# Patient Record
Sex: Female | Born: 1950
Health system: Southern US, Community
[De-identification: ages and names within clinical notes are randomized; demographics above are authoritative.]

## PROBLEM LIST (undated history)

## (undated) DIAGNOSIS — F32A Depression, unspecified: Secondary | ICD-10-CM

## (undated) DIAGNOSIS — R002 Palpitations: Secondary | ICD-10-CM

## (undated) DIAGNOSIS — I7300 Raynaud's syndrome without gangrene: Secondary | ICD-10-CM

## (undated) DIAGNOSIS — E119 Type 2 diabetes mellitus without complications: Secondary | ICD-10-CM

## (undated) DIAGNOSIS — I341 Nonrheumatic mitral (valve) prolapse: Secondary | ICD-10-CM

## (undated) DIAGNOSIS — I10 Essential (primary) hypertension: Secondary | ICD-10-CM

## (undated) DIAGNOSIS — F419 Anxiety disorder, unspecified: Secondary | ICD-10-CM

## (undated) DIAGNOSIS — M7989 Other specified soft tissue disorders: Secondary | ICD-10-CM

## (undated) DIAGNOSIS — G4700 Insomnia, unspecified: Secondary | ICD-10-CM

## (undated) DIAGNOSIS — R5383 Other fatigue: Secondary | ICD-10-CM

## (undated) DIAGNOSIS — R0789 Other chest pain: Secondary | ICD-10-CM

## (undated) DIAGNOSIS — Z87898 Personal history of other specified conditions: Secondary | ICD-10-CM

## (undated) DIAGNOSIS — F329 Major depressive disorder, single episode, unspecified: Secondary | ICD-10-CM

## (undated) DIAGNOSIS — E669 Obesity, unspecified: Secondary | ICD-10-CM

## (undated) DIAGNOSIS — R42 Dizziness and giddiness: Secondary | ICD-10-CM

## (undated) DIAGNOSIS — R202 Paresthesia of skin: Secondary | ICD-10-CM

## (undated) DIAGNOSIS — E785 Hyperlipidemia, unspecified: Secondary | ICD-10-CM

## (undated) HISTORY — DX: Personal history of other specified conditions: Z87.898

## (undated) HISTORY — DX: Obesity, unspecified: E66.9

## (undated) HISTORY — DX: Dizziness and giddiness: R42

## (undated) HISTORY — DX: Essential (primary) hypertension: I10

## (undated) HISTORY — PX: BREAST SURGERY: SHX581

## (undated) HISTORY — DX: Palpitations: R00.2

## (undated) HISTORY — PX: ABDOMINAL HYSTERECTOMY: SHX81

## (undated) HISTORY — DX: Paresthesia of skin: R20.2

## (undated) HISTORY — DX: Type 2 diabetes mellitus without complications: E11.9

## (undated) HISTORY — DX: Depression, unspecified: F32.A

## (undated) HISTORY — PX: OTHER SURGICAL HISTORY: SHX169

## (undated) HISTORY — DX: Other chest pain: R07.89

## (undated) HISTORY — PX: CHOLECYSTECTOMY: SHX55

## (undated) HISTORY — DX: Nonrheumatic mitral (valve) prolapse: I34.1

## (undated) HISTORY — DX: Anxiety disorder, unspecified: F41.9

## (undated) HISTORY — DX: Insomnia, unspecified: G47.00

## (undated) HISTORY — PX: BREAST EXCISIONAL BIOPSY: SUR124

## (undated) HISTORY — DX: Raynaud's syndrome without gangrene: I73.00

## (undated) HISTORY — DX: Major depressive disorder, single episode, unspecified: F32.9

## (undated) HISTORY — DX: Hyperlipidemia, unspecified: E78.5

## (undated) HISTORY — DX: Other fatigue: R53.83

---

## 1987-09-05 HISTORY — PX: VESICOVAGINAL FISTULA CLOSURE W/ TAH: SUR271

## 2001-03-04 ENCOUNTER — Ambulatory Visit (HOSPITAL_COMMUNITY): Admission: RE | Admit: 2001-03-04 | Discharge: 2001-03-04 | Payer: Self-pay | Admitting: Family Medicine

## 2001-03-04 ENCOUNTER — Encounter: Payer: Self-pay | Admitting: Family Medicine

## 2001-05-22 ENCOUNTER — Other Ambulatory Visit: Admission: RE | Admit: 2001-05-22 | Discharge: 2001-05-22 | Payer: Self-pay | Admitting: Family Medicine

## 2002-03-06 ENCOUNTER — Ambulatory Visit (HOSPITAL_COMMUNITY): Admission: RE | Admit: 2002-03-06 | Discharge: 2002-03-06 | Payer: Self-pay | Admitting: Family Medicine

## 2002-03-06 ENCOUNTER — Encounter: Payer: Self-pay | Admitting: Family Medicine

## 2003-03-19 ENCOUNTER — Encounter: Payer: Self-pay | Admitting: *Deleted

## 2003-03-19 ENCOUNTER — Encounter (HOSPITAL_COMMUNITY): Admission: RE | Admit: 2003-03-19 | Discharge: 2003-04-18 | Payer: Self-pay | Admitting: *Deleted

## 2003-06-09 ENCOUNTER — Encounter: Payer: Self-pay | Admitting: Family Medicine

## 2003-06-09 ENCOUNTER — Ambulatory Visit (HOSPITAL_COMMUNITY): Admission: RE | Admit: 2003-06-09 | Discharge: 2003-06-09 | Payer: Self-pay | Admitting: Family Medicine

## 2003-06-11 ENCOUNTER — Ambulatory Visit (HOSPITAL_COMMUNITY): Admission: RE | Admit: 2003-06-11 | Discharge: 2003-06-11 | Payer: Self-pay | Admitting: General Surgery

## 2003-06-11 HISTORY — PX: COLONOSCOPY: SHX174

## 2004-08-08 ENCOUNTER — Ambulatory Visit: Payer: Self-pay | Admitting: Family Medicine

## 2004-10-31 ENCOUNTER — Ambulatory Visit: Payer: Self-pay | Admitting: Family Medicine

## 2005-01-04 ENCOUNTER — Ambulatory Visit: Payer: Self-pay | Admitting: Family Medicine

## 2005-01-11 ENCOUNTER — Ambulatory Visit (HOSPITAL_COMMUNITY): Admission: RE | Admit: 2005-01-11 | Discharge: 2005-01-11 | Payer: Self-pay | Admitting: Family Medicine

## 2005-03-22 ENCOUNTER — Ambulatory Visit: Payer: Self-pay | Admitting: Family Medicine

## 2005-03-31 ENCOUNTER — Emergency Department (HOSPITAL_COMMUNITY): Admission: EM | Admit: 2005-03-31 | Discharge: 2005-03-31 | Payer: Self-pay | Admitting: Emergency Medicine

## 2005-04-06 ENCOUNTER — Ambulatory Visit: Payer: Self-pay | Admitting: Family Medicine

## 2005-05-18 ENCOUNTER — Ambulatory Visit: Payer: Self-pay | Admitting: Family Medicine

## 2005-08-01 ENCOUNTER — Ambulatory Visit: Payer: Self-pay | Admitting: Family Medicine

## 2005-09-20 ENCOUNTER — Ambulatory Visit: Payer: Self-pay | Admitting: Family Medicine

## 2005-11-13 ENCOUNTER — Ambulatory Visit: Payer: Self-pay | Admitting: Family Medicine

## 2006-01-26 ENCOUNTER — Ambulatory Visit: Payer: Self-pay | Admitting: Family Medicine

## 2006-02-05 ENCOUNTER — Ambulatory Visit (HOSPITAL_COMMUNITY): Admission: RE | Admit: 2006-02-05 | Discharge: 2006-02-05 | Payer: Self-pay | Admitting: Family Medicine

## 2006-02-14 ENCOUNTER — Ambulatory Visit: Payer: Self-pay | Admitting: Family Medicine

## 2006-02-22 ENCOUNTER — Ambulatory Visit (HOSPITAL_COMMUNITY): Admission: RE | Admit: 2006-02-22 | Discharge: 2006-02-22 | Payer: Self-pay | Admitting: Family Medicine

## 2006-04-26 ENCOUNTER — Ambulatory Visit: Payer: Self-pay | Admitting: Family Medicine

## 2006-08-07 ENCOUNTER — Ambulatory Visit: Payer: Self-pay | Admitting: Family Medicine

## 2006-10-03 ENCOUNTER — Ambulatory Visit: Payer: Self-pay | Admitting: Family Medicine

## 2006-10-11 ENCOUNTER — Ambulatory Visit: Payer: Self-pay | Admitting: Orthopedic Surgery

## 2006-11-06 ENCOUNTER — Encounter: Payer: Self-pay | Admitting: Family Medicine

## 2006-11-06 LAB — CONVERTED CEMR LAB
Alkaline Phosphatase: 74 units/L (ref 39–117)
Basophils Relative: 1 % (ref 0–1)
Bilirubin, Direct: 0.1 mg/dL (ref 0.0–0.3)
CO2: 28 meq/L (ref 19–32)
Calcium: 9 mg/dL (ref 8.4–10.5)
Cholesterol: 152 mg/dL (ref 0–200)
Eosinophils Absolute: 0.2 10*3/uL (ref 0.0–0.7)
HDL: 71 mg/dL (ref >39)
Hemoglobin: 12.7 g/dL (ref 12.0–15.0)
LDL Cholesterol: 66 mg/dL (ref 0–99)
Lymphocytes Relative: 34 % (ref 12–46)
Lymphs Abs: 1.6 10*3/uL (ref 0.7–3.3)
MCV: 91.6 fL (ref 78.0–100.0)
Monocytes Absolute: 0.6 10*3/uL (ref 0.2–0.7)
Monocytes Relative: 12 % — ABNORMAL HIGH (ref 3–11)
Neutrophils Relative %: 49 % (ref 43–77)
Platelets: 257 10*3/uL (ref 150–400)
Potassium: 3.8 meq/L (ref 3.5–5.3)
RBC: 4.18 M/uL (ref 3.87–5.11)
RDW: 13.3 % (ref 11.5–14.0)
Total Bilirubin: 0.4 mg/dL (ref 0.3–1.2)
Total CHOL/HDL Ratio: 2.1
Triglycerides: 74 mg/dL (ref <150)
VLDL: 15 mg/dL (ref 0–40)

## 2006-11-08 ENCOUNTER — Ambulatory Visit: Payer: Self-pay | Admitting: Family Medicine

## 2007-02-07 ENCOUNTER — Ambulatory Visit: Payer: Self-pay | Admitting: Family Medicine

## 2007-03-14 ENCOUNTER — Ambulatory Visit: Payer: Self-pay | Admitting: Family Medicine

## 2007-03-21 ENCOUNTER — Ambulatory Visit (HOSPITAL_COMMUNITY): Admission: RE | Admit: 2007-03-21 | Discharge: 2007-03-21 | Payer: Self-pay | Admitting: Family Medicine

## 2007-03-26 ENCOUNTER — Ambulatory Visit: Payer: Self-pay | Admitting: Family Medicine

## 2007-03-26 ENCOUNTER — Encounter: Payer: Self-pay | Admitting: Family Medicine

## 2007-03-26 ENCOUNTER — Other Ambulatory Visit: Admission: RE | Admit: 2007-03-26 | Discharge: 2007-03-26 | Payer: Self-pay | Admitting: Family Medicine

## 2007-03-26 LAB — CONVERTED CEMR LAB: Pap Smear: NORMAL

## 2007-08-27 ENCOUNTER — Ambulatory Visit: Payer: Self-pay | Admitting: Family Medicine

## 2007-09-05 ENCOUNTER — Encounter: Payer: Self-pay | Admitting: Family Medicine

## 2007-09-17 ENCOUNTER — Encounter: Payer: Self-pay | Admitting: Family Medicine

## 2007-09-17 LAB — CONVERTED CEMR LAB
Albumin: 4.2 g/dL (ref 3.5–5.2)
Alkaline Phosphatase: 91 units/L (ref 39–117)
CO2: 23 meq/L (ref 19–32)
Chloride: 104 meq/L (ref 96–112)
Cholesterol: 170 mg/dL (ref 0–200)
Creatinine, Ser: 0.86 mg/dL (ref 0.40–1.20)
Indirect Bilirubin: 0.3 mg/dL (ref 0.0–0.9)
LDL Cholesterol: 79 mg/dL (ref 0–99)
Total CHOL/HDL Ratio: 2.2
Total Protein: 7.6 g/dL (ref 6.0–8.3)
Triglycerides: 64 mg/dL (ref <150)
VLDL: 13 mg/dL (ref 0–40)

## 2007-10-29 ENCOUNTER — Ambulatory Visit: Payer: Self-pay | Admitting: Family Medicine

## 2007-11-28 ENCOUNTER — Ambulatory Visit: Payer: Self-pay | Admitting: Family Medicine

## 2008-02-04 ENCOUNTER — Encounter: Payer: Self-pay | Admitting: Family Medicine

## 2008-02-04 DIAGNOSIS — F418 Other specified anxiety disorders: Secondary | ICD-10-CM

## 2008-02-04 DIAGNOSIS — E785 Hyperlipidemia, unspecified: Secondary | ICD-10-CM

## 2008-02-04 DIAGNOSIS — G4700 Insomnia, unspecified: Secondary | ICD-10-CM

## 2008-02-04 DIAGNOSIS — I10 Essential (primary) hypertension: Secondary | ICD-10-CM

## 2008-02-04 DIAGNOSIS — F411 Generalized anxiety disorder: Secondary | ICD-10-CM | POA: Insufficient documentation

## 2008-03-20 ENCOUNTER — Encounter: Payer: Self-pay | Admitting: Family Medicine

## 2008-03-20 LAB — CONVERTED CEMR LAB
Albumin: 4.2 g/dL (ref 3.5–5.2)
Alkaline Phosphatase: 86 units/L (ref 39–117)
BUN: 16 mg/dL (ref 6–23)
CO2: 24 meq/L (ref 19–32)
Chloride: 106 meq/L (ref 96–112)
Cholesterol: 143 mg/dL (ref 0–200)
Eosinophils Absolute: 0.2 10*3/uL (ref 0.0–0.7)
Eosinophils Relative: 4 % (ref 0–5)
HCT: 39.6 % (ref 36.0–46.0)
Indirect Bilirubin: 0.2 mg/dL (ref 0.0–0.9)
Lymphocytes Relative: 45 % (ref 12–46)
MCV: 92.5 fL (ref 78.0–100.0)
Neutrophils Relative %: 39 % — ABNORMAL LOW (ref 43–77)
Platelets: 244 10*3/uL (ref 150–400)
Sodium: 142 meq/L (ref 135–145)
Total Bilirubin: 0.3 mg/dL (ref 0.3–1.2)
Triglycerides: 91 mg/dL (ref <150)
VLDL: 18 mg/dL (ref 0–40)
WBC: 4.9 10*3/uL (ref 4.0–10.5)

## 2008-03-26 ENCOUNTER — Ambulatory Visit: Payer: Self-pay | Admitting: Family Medicine

## 2008-03-26 ENCOUNTER — Other Ambulatory Visit: Admission: RE | Admit: 2008-03-26 | Discharge: 2008-03-26 | Payer: Self-pay | Admitting: Family Medicine

## 2008-03-26 ENCOUNTER — Encounter: Payer: Self-pay | Admitting: Family Medicine

## 2008-03-27 ENCOUNTER — Ambulatory Visit (HOSPITAL_COMMUNITY): Admission: RE | Admit: 2008-03-27 | Discharge: 2008-03-27 | Payer: Self-pay | Admitting: Family Medicine

## 2008-04-03 ENCOUNTER — Encounter: Payer: Self-pay | Admitting: Family Medicine

## 2008-04-06 ENCOUNTER — Ambulatory Visit (HOSPITAL_COMMUNITY): Admission: RE | Admit: 2008-04-06 | Discharge: 2008-04-06 | Payer: Self-pay | Admitting: Family Medicine

## 2008-04-13 ENCOUNTER — Encounter: Payer: Self-pay | Admitting: Family Medicine

## 2008-04-27 ENCOUNTER — Encounter: Payer: Self-pay | Admitting: Family Medicine

## 2008-04-28 ENCOUNTER — Encounter: Payer: Self-pay | Admitting: Family Medicine

## 2008-05-05 ENCOUNTER — Ambulatory Visit: Payer: Self-pay | Admitting: Family Medicine

## 2008-05-05 DIAGNOSIS — R040 Epistaxis: Secondary | ICD-10-CM

## 2008-06-22 ENCOUNTER — Encounter: Payer: Self-pay | Admitting: Family Medicine

## 2008-08-03 ENCOUNTER — Ambulatory Visit: Payer: Self-pay | Admitting: Family Medicine

## 2008-08-25 ENCOUNTER — Encounter: Payer: Self-pay | Admitting: Family Medicine

## 2008-09-17 ENCOUNTER — Ambulatory Visit: Payer: Self-pay | Admitting: Family Medicine

## 2008-10-06 ENCOUNTER — Telehealth: Payer: Self-pay | Admitting: Family Medicine

## 2008-10-15 ENCOUNTER — Ambulatory Visit (HOSPITAL_COMMUNITY): Admission: RE | Admit: 2008-10-15 | Discharge: 2008-10-15 | Payer: Self-pay | Admitting: Family Medicine

## 2008-10-26 ENCOUNTER — Telehealth: Payer: Self-pay | Admitting: Family Medicine

## 2008-11-17 ENCOUNTER — Encounter: Payer: Self-pay | Admitting: Family Medicine

## 2008-11-27 ENCOUNTER — Encounter: Payer: Self-pay | Admitting: Family Medicine

## 2008-11-30 ENCOUNTER — Encounter: Payer: Self-pay | Admitting: Family Medicine

## 2008-12-29 ENCOUNTER — Encounter: Payer: Self-pay | Admitting: Family Medicine

## 2008-12-30 ENCOUNTER — Ambulatory Visit: Payer: Self-pay | Admitting: Family Medicine

## 2009-01-02 ENCOUNTER — Encounter: Payer: Self-pay | Admitting: Family Medicine

## 2009-01-05 ENCOUNTER — Encounter: Payer: Self-pay | Admitting: Family Medicine

## 2009-01-05 LAB — CONVERTED CEMR LAB
ALT: 19 units/L (ref 0–35)
Alkaline Phosphatase: 87 units/L (ref 39–117)
CO2: 25 meq/L (ref 19–32)
Calcium: 9.1 mg/dL (ref 8.4–10.5)
Chloride: 103 meq/L (ref 96–112)
HDL: 68 mg/dL (ref >39)
LDL Cholesterol: 66 mg/dL (ref 0–99)
Sodium: 139 meq/L (ref 135–145)
TSH: 1.031 microintl units/mL (ref 0.350–4.500)
Total Bilirubin: 0.3 mg/dL (ref 0.3–1.2)
Total CHOL/HDL Ratio: 2.1
Triglycerides: 57 mg/dL (ref <150)
VLDL: 11 mg/dL (ref 0–40)

## 2009-02-25 ENCOUNTER — Ambulatory Visit: Payer: Self-pay | Admitting: Family Medicine

## 2009-02-26 DIAGNOSIS — R5383 Other fatigue: Secondary | ICD-10-CM

## 2009-02-26 DIAGNOSIS — R079 Chest pain, unspecified: Secondary | ICD-10-CM

## 2009-02-26 DIAGNOSIS — R5381 Other malaise: Secondary | ICD-10-CM

## 2009-03-17 ENCOUNTER — Ambulatory Visit: Payer: Self-pay | Admitting: Cardiology

## 2009-04-02 ENCOUNTER — Ambulatory Visit (HOSPITAL_COMMUNITY): Admission: RE | Admit: 2009-04-02 | Discharge: 2009-04-02 | Payer: Self-pay | Admitting: Family Medicine

## 2009-04-12 ENCOUNTER — Ambulatory Visit: Payer: Self-pay | Admitting: Cardiology

## 2009-04-12 ENCOUNTER — Encounter: Payer: Self-pay | Admitting: Cardiology

## 2009-04-12 ENCOUNTER — Ambulatory Visit (HOSPITAL_COMMUNITY): Admission: RE | Admit: 2009-04-12 | Discharge: 2009-04-12 | Payer: Self-pay | Admitting: Cardiology

## 2009-04-23 ENCOUNTER — Ambulatory Visit: Payer: Self-pay | Admitting: Cardiology

## 2009-05-03 ENCOUNTER — Encounter: Payer: Self-pay | Admitting: Family Medicine

## 2009-05-03 ENCOUNTER — Ambulatory Visit: Payer: Self-pay | Admitting: Family Medicine

## 2009-05-03 ENCOUNTER — Other Ambulatory Visit: Admission: RE | Admit: 2009-05-03 | Discharge: 2009-05-03 | Payer: Self-pay | Admitting: Family Medicine

## 2009-06-08 ENCOUNTER — Ambulatory Visit: Payer: Self-pay | Admitting: Family Medicine

## 2009-06-08 DIAGNOSIS — B353 Tinea pedis: Secondary | ICD-10-CM

## 2009-06-08 DIAGNOSIS — R002 Palpitations: Secondary | ICD-10-CM | POA: Insufficient documentation

## 2009-06-21 ENCOUNTER — Encounter: Payer: Self-pay | Admitting: Family Medicine

## 2009-06-24 ENCOUNTER — Ambulatory Visit: Payer: Self-pay | Admitting: Cardiology

## 2009-06-24 ENCOUNTER — Telehealth: Payer: Self-pay | Admitting: Family Medicine

## 2009-06-25 ENCOUNTER — Telehealth: Payer: Self-pay | Admitting: Family Medicine

## 2009-06-30 ENCOUNTER — Encounter: Payer: Self-pay | Admitting: Family Medicine

## 2009-07-07 ENCOUNTER — Ambulatory Visit: Payer: Self-pay | Admitting: Family Medicine

## 2009-09-03 ENCOUNTER — Encounter: Payer: Self-pay | Admitting: Family Medicine

## 2009-09-20 ENCOUNTER — Ambulatory Visit: Payer: Self-pay | Admitting: Family Medicine

## 2009-09-24 ENCOUNTER — Encounter: Payer: Self-pay | Admitting: Family Medicine

## 2009-09-25 ENCOUNTER — Encounter: Payer: Self-pay | Admitting: Family Medicine

## 2009-09-25 LAB — CONVERTED CEMR LAB
ALT: 20 units/L (ref 0–35)
Alkaline Phosphatase: 80 units/L (ref 39–117)
Basophils Absolute: 0 10*3/uL (ref 0.0–0.1)
Basophils Relative: 1 % (ref 0–1)
Bilirubin, Direct: 0.1 mg/dL (ref 0.0–0.3)
Creatinine, Ser: 0.84 mg/dL (ref 0.40–1.20)
Eosinophils Absolute: 0.2 10*3/uL (ref 0.0–0.7)
Indirect Bilirubin: 0.3 mg/dL (ref 0.0–0.9)
Lymphs Abs: 1.9 10*3/uL (ref 0.7–4.0)
Monocytes Absolute: 0.5 10*3/uL (ref 0.1–1.0)
Monocytes Relative: 12 % (ref 3–12)
Neutro Abs: 1.6 10*3/uL — ABNORMAL LOW (ref 1.7–7.7)
Neutrophils Relative %: 37 % — ABNORMAL LOW (ref 43–77)
RBC: 4.25 M/uL (ref 3.87–5.11)
Sodium: 141 meq/L (ref 135–145)
Total CHOL/HDL Ratio: 2.3
Total Protein: 7.5 g/dL (ref 6.0–8.3)
Triglycerides: 66 mg/dL (ref <150)
VLDL: 13 mg/dL (ref 0–40)

## 2009-10-25 ENCOUNTER — Ambulatory Visit: Payer: Self-pay | Admitting: Family Medicine

## 2009-10-29 ENCOUNTER — Telehealth: Payer: Self-pay | Admitting: Family Medicine

## 2009-11-03 ENCOUNTER — Telehealth: Payer: Self-pay | Admitting: Family Medicine

## 2009-11-04 ENCOUNTER — Encounter: Payer: Self-pay | Admitting: Family Medicine

## 2010-04-11 ENCOUNTER — Ambulatory Visit: Payer: Self-pay | Admitting: Family Medicine

## 2010-04-11 DIAGNOSIS — M129 Arthropathy, unspecified: Secondary | ICD-10-CM | POA: Insufficient documentation

## 2010-04-18 ENCOUNTER — Ambulatory Visit (HOSPITAL_COMMUNITY): Admission: RE | Admit: 2010-04-18 | Discharge: 2010-04-18 | Payer: Self-pay | Admitting: Family Medicine

## 2010-05-13 ENCOUNTER — Ambulatory Visit: Payer: Self-pay | Admitting: Family Medicine

## 2010-05-13 LAB — CONVERTED CEMR LAB
ALT: 24 units/L (ref 0–35)
AST: 19 units/L (ref 0–37)
Alkaline Phosphatase: 94 units/L (ref 39–117)
BUN: 14 mg/dL (ref 6–23)
CO2: 28 meq/L (ref 19–32)
Sodium: 143 meq/L (ref 135–145)
TSH: 1.213 microintl units/mL (ref 0.350–4.500)
Total CHOL/HDL Ratio: 2.1

## 2010-07-12 ENCOUNTER — Telehealth: Payer: Self-pay | Admitting: Family Medicine

## 2010-07-19 ENCOUNTER — Ambulatory Visit: Payer: Self-pay | Admitting: Family Medicine

## 2010-07-19 ENCOUNTER — Other Ambulatory Visit: Admission: RE | Admit: 2010-07-19 | Discharge: 2010-07-19 | Payer: Self-pay | Admitting: Family Medicine

## 2010-07-22 ENCOUNTER — Encounter: Payer: Self-pay | Admitting: Family Medicine

## 2010-07-26 ENCOUNTER — Telehealth: Payer: Self-pay | Admitting: Family Medicine

## 2010-07-27 LAB — CONVERTED CEMR LAB: Pap Smear: NEGATIVE

## 2010-08-26 ENCOUNTER — Telehealth: Payer: Self-pay | Admitting: Family Medicine

## 2010-09-26 ENCOUNTER — Encounter: Payer: Self-pay | Admitting: Family Medicine

## 2010-10-04 NOTE — Progress Notes (Signed)
Summary: dermatology  dermatology   Imported By: Lind Guest 09/20/2009 08:47:58  _____________________________________________________________________  External Attachment:    Type:   Image     Comment:   External Document

## 2010-10-04 NOTE — Miscellaneous (Signed)
Summary: Orders Update  Clinical Lists Changes  Orders: Added new Test order of T-Basic Metabolic Panel ([id number]) - Signed Added new Test order of T-Hepatic Function ([id number]) - Signed Added new Test order of T-Lipid Profile ([id number]) - Signed Added new Test order of T-TSH ([id number]) - Signed 

## 2010-10-04 NOTE — Progress Notes (Signed)
Summary: insurance  Phone Note Call from Patient   Summary of Call: pt needs to speak with nurse about ins. on a rx. [phone] Initial call taken by: Rudene Anda,  November 03, 2009 2:42 PM  Follow-up for Phone Call        Ambien CR not covered. Pt wanted zolpidem sent in so she could get it. Zolpidem also not covered. Filled out PA and sent in. Should hear something tomorrow.  Called patient no answer. Left msg Follow-up by: Everitt Amber LPN,  November 03, 2009 2:57 PM  Additional Follow-up for Phone Call Additional follow up Details #1::        called patient to let her know Zolpidem wasn't covered and that the PA was in progress. Family member to get her to call me back Additional Follow-up by: Everitt Amber LPN,  November 04, 2009 10:02 AM    Additional Follow-up for Phone Call Additional follow up Details #2::    Ambien approved. Sent to [employer]. Will let patient know when she calls back  Follow-up by: Everitt Amber LPN,  November 04, 2009 2:47 PM

## 2010-10-04 NOTE — Assessment & Plan Note (Signed)
Summary: ov   Vital Signs:  Patient profile:   60 year old female Menstrual status:  hysterectomy Height:      64 inches Weight:      200 pounds BMI:     34.45 O2 Sat:      98 % Pulse rate:   91 / minute Pulse rhythm:   regular Resp:     16 per minute BP sitting:   118 / 74 Cuff size:   large  Vitals Entered By: Everitt Amber (September 20, 2009 4:08 PM)  Nutrition Counseling: Patient's BMI is greater than 25 and therefore counseled on weight management options. CC: Follow up chronic problems   Primary Care Provider:  Syliva Overman MD  CC:  Follow up chronic problems.  History of Present Illness: Reports  that she has been doing well. Denies recent fever or chills. Denies sinus pressure, nasal congestion , ear pain or sore throat.She doe report unilateral epistaxis for several months however. Denies chest congestion, or cough productive of sputum. Denies chest pain, palpitations, PND, orthopnea or leg swelling. Denies abdominal pain, nausea, vomitting, diarrhea or constipation. Denies change in bowel movements or bloody stool. Denies dysuria , frequency, incontinence or hesitancy. Denies  joint pain, swelling, or reduced mobility. Denies headaches, vertigo, seizures. Denies depression, anxiety or insomnia. Denies  rash, lesions, or itch. The pt reports regular exercise, and is somewhat disappointedby her lack of weight loss, which she intends to work hard at.     Current Medications (verified): 1)  Diovan Hct 80-12.5 Mg Tabs (Valsartan-Hydrochlorothiazide) .... Take 1 Tablet By Mouth Once A Day 2)  Aspirin 81 Mg  Tbec (Aspirin) .... Take 1 Tablet By Mouth Once A Day 3)  Ibuprofen 800 Mg Tabs (Ibuprofen) .... Take 1 Tablet By Mouth Two Times A Day As Needed 4)  Simvastatin 40 Mg Tabs (Simvastatin) .... One Tab By Mouth Qhs 5)  Ambien Cr 12.5 Mg Cr-Tabs (Zolpidem Tartrate) .... Take 1 Tab By Mouth At Bedtime As Needed 6)  Norvasc 5 Mg Tabs (Amlodipine Besylate) ....  Take 1 Tablet Once Daily 7)  Venlafaxine Hcl 37.5 Mg Tabs (Venlafaxine Hcl) .... Take 1 Tablet Two Times A Day 8)  Fish Oil 1000 Mg Caps (Omega-3 Fatty Acids) .... Take 1 Tablet By Mouth Once A Day 9)  Vitamin E 200 Unit Caps (Vitamin E) .... Take 1 Tablet By Mouth Once A Day 10)  Atenolol 25 Mg Tabs (Atenolol) .... Take 1 Tablet By Mouth Two Times A Day 11)  Clotrimazole-Betamethasone 1-0.05 % Lotn (Clotrimazole-Betamethasone) .... Apply To Affected Lesions On The Dorsum of Both Feet 12)  Betamethasone Valerate 0.1 % Crea (Betamethasone Valerate) .... Apply  To Palms  Twice Daily For  10 Days, Then As Needed 13)  Hydrochlorothiazide 12.5 Mg Tabs (Hydrochlorothiazide) .... Take 1 Tablet By Mouth Once A Day 14)  Diovan Hct 80-12.5 Mg Tabs (Valsartan-Hydrochlorothiazide) .... Take 1 Tablet By Mouth Once A Day  Allergies (verified): No Known Drug Allergies  Review of Systems      See HPI ENT:  Complains of nosebleeds and postnasal drainage; 6 month history of unilateral right sided epistaxis, spontaneous, on a bad week once or twice, had excessive symptoms last week with bloody post nasal drainage x 1 episode. MS:  Complains of joint pain and stiffness; rheumatoid arthritis currently improved, sees rheumatology regularly. Derm:  Denies insect bite(s), itching, and rash. Neuro:  Denies headaches and visual disturbances. Endo:  Denies cold intolerance, excessive hunger, excessive thirst, excessive urination,  heat intolerance, polyuria, and weight change.  Physical Exam  General:  Well-developed,well-nourished,in no acute distress; alert,appropriate and cooperative throughout examination HEENT: No facial asymmetry,  EOMI, no  sinus tenderness, TM's Clear, oropharynx  pink and moist.   Chest: clear to ascultation CVS: S1, S2, No murmurs, No S3.   Abd: Soft, Nontender.  MS: Adequate ROM spine, hips, shoulders and knees.  Ext: No edema.   CNS: CN 2-12 intact, power tone and sensation normal  throughout.   Skin: Intact, Psych: Good eye contact, normal affect.  Memory intact, not anxious or depressed appearing.    Impression & Recommendations:  Problem # 1:  EPISTAXIS, RECURRENT (ICD-784.7) Assessment Unchanged  Future Orders: ENT Referral (ENT) ... 09/21/2009  Problem # 2:  SKIN RASH (ICD-782.1) Assessment: Improved  Her updated medication list for this problem includes:    Clotrimazole-betamethasone 1-0.05 % Lotn (Clotrimazole-betamethasone) .Marland Kitchen... Apply to affected lesions on the dorsum of both feet    Betamethasone Valerate 0.1 % Crea (Betamethasone valerate) .Marland Kitchen... Apply  to palms  twice daily for  10 days, then as needed  Problem # 3:  OBESITY (ICD-278.00) Assessment: Unchanged  Ht: 64 (09/20/2009)   Wt: 200 (09/20/2009)   BMI: 34.45 (09/20/2009)  Problem # 4:  HYPERTENSION (ICD-401.9) Assessment: Unchanged  Her updated medication list for this problem includes:    Diovan Hct 80-12.5 Mg Tabs (Valsartan-hydrochlorothiazide) .Marland Kitchen... Take 1 tablet by mouth once a day    Norvasc 5 Mg Tabs (Amlodipine besylate) .Marland Kitchen... Take 1 tablet once daily    Atenolol 25 Mg Tabs (Atenolol) .Marland Kitchen... Take 1 tablet by mouth two times a day    Hydrochlorothiazide 12.5 Mg Tabs (Hydrochlorothiazide) .Marland Kitchen... Take 1 tablet by mouth once a day  Orders: T-Basic Metabolic Panel ([id number])  BP today: 118/74 Prior BP: 122/74 (07/07/2009)  Labs Reviewed: K+: 3.7 (01/02/2009) Creat: : 0.92 (01/02/2009)   Chol: 145 (01/02/2009)   HDL: 68 (01/02/2009)   LDL: 66 (01/02/2009)   TG: 57 (01/02/2009)  Problem # 5:  HYPERLIPIDEMIA (ICD-272.4) Assessment: Comment Only  Her updated medication list for this problem includes:    Simvastatin 40 Mg Tabs (Simvastatin) ..... One tab by mouth qhs  Orders: T-Lipid Profile [id number]) T-Hepatic Function [id number])  Labs Reviewed: SGOT: 18 (01/02/2009)   SGPT: 19 (01/02/2009)   HDL:68 (01/02/2009), 71 (03/20/2008)  LDL:66 (01/02/2009), 54  (28/41/3244)  Chol:145 (01/02/2009), 143 (03/20/2008)  Trig:57 (01/02/2009), 91 (03/20/2008)  Complete Medication List: 1)  Diovan Hct 80-12.5 Mg Tabs (Valsartan-hydrochlorothiazide) .... Take 1 tablet by mouth once a day 2)  Aspirin 81 Mg Tbec (Aspirin) .... Take 1 tablet by mouth once a day 3)  Ibuprofen 800 Mg Tabs (Ibuprofen) .... Take 1 tablet by mouth two times a day as needed 4)  Simvastatin 40 Mg Tabs (Simvastatin) .... One tab by mouth qhs 5)  Ambien Cr 12.5 Mg Cr-tabs (Zolpidem tartrate) .... Take 1 tab by mouth at bedtime as needed 6)  Norvasc 5 Mg Tabs (Amlodipine besylate) .... Take 1 tablet once daily 7)  Venlafaxine Hcl 37.5 Mg Tabs (Venlafaxine hcl) .... Take 1 tablet two times a day 8)  Fish Oil 1000 Mg Caps (Omega-3 fatty acids) .... Take 1 tablet by mouth once a day 9)  Vitamin E 200 Unit Caps (Vitamin e) .... Take 1 tablet by mouth once a day 10)  Atenolol 25 Mg Tabs (Atenolol) .... Take 1 tablet by mouth two times a day 11)  Clotrimazole-betamethasone 1-0.05 % Lotn (Clotrimazole-betamethasone) .... Apply to affected lesions on  the dorsum of both feet 12)  Betamethasone Valerate 0.1 % Crea (Betamethasone valerate) .... Apply  to palms  twice daily for  10 days, then as needed 13)  Hydrochlorothiazide 12.5 Mg Tabs (Hydrochlorothiazide) .... Take 1 tablet by mouth once a day 14)  Diovan Hct 80-12.5 Mg Tabs (Valsartan-hydrochlorothiazide) .... Take 1 tablet by mouth once a day  Other Orders: T-CBC w/Diff ([id number])  Patient Instructions: 1)  Please schedule a follow-up appointment in 4 months. 2)  It is important that you exercise regularly at least 20 minutes 5 times a week. If you develop chest pain, have severe difficulty breathing, or feel very tired , stop exercising immediately and seek medical attention. 3)  You need to lose weight. Consider a lower calorie diet and regular exercise.  4)  BMP prior to visit, ICD-9: 5)  Hepatic Panel prior to visit, ICD-9:  fasting  asap 6)  Lipid Panel prior to visit, ICD-9: 7)  CBC w/ Diff prior to visit, ICD-9: 8)  You will be referred to ENT regarding unilateral nose bleeds. Prescriptions: DIOVAN HCT 80-12.5 MG TABS (VALSARTAN-HYDROCHLOROTHIAZIDE) Take 1 tablet by mouth once a day  #28 x 0   Entered by:   Worthy Keeler LPN   Authorized by:   Syliva Overman MD   Signed by:   Syliva Overman MD on 09/26/2009   Method used:   Samples Given   RxID:   [id number] AMBIEN CR 12.5 MG CR-TABS (ZOLPIDEM TARTRATE) Take 1 tab by mouth at bedtime as needed Brand medically necessary #30 x 3   Entered and Authorized by:   Syliva Overman MD   Signed by:   Syliva Overman MD on 09/26/2009   Method used:   Handwritten   RxID:   [id number]

## 2010-10-04 NOTE — Progress Notes (Signed)
Summary: NEEDS MEDICINE ASAP  Phone Note Call from Patient   Summary of Call: NEEDS HER CAUDET SENT TO [facility] BEN OUT OF THIS FOR WEEK Initial call taken by: Lind Guest,  July 26, 2010 9:02 AM    Prescriptions: CADUET 5-40 MG TABS (AMLODIPINE-ATORVASTATIN) Take 1 tab by mouth at bedtime  #90 x 1   Entered by:   Adella Hare LPN   Authorized by:   Syliva Overman MD   Signed by:   Adella Hare LPN on 21/30/8657   Method used:   Electronically to        [facility] [phone]* (retail)       [address], [place]  [address]       Ph: [phone]       Fax: [phone]   RxID:   [id number]

## 2010-10-04 NOTE — Assessment & Plan Note (Signed)
Summary: ov   Vital Signs:  Patient profile:   60 year old female Menstrual status:  hysterectomy Height:      64 inches Weight:      195.75 pounds BMI:     33.72 O2 Sat:      97 % Temp:     99.1 degrees F oral Pulse rate:   104 / minute Pulse rhythm:   regular Resp:     16 per minute BP sitting:   120 / 78  (left arm) Cuff size:   large  Vitals Entered By: Everitt Amber LPN (October 25, 2009 2:27 PM)  Nutrition Counseling: Patient's BMI is greater than 25 and therefore counseled on weight management options. CC: had a cough all weekend, achy, chills. No fever, runny nose. Dry cough, no phlegm production, nasal drainage is clear-greenish   Primary Care Provider:  Syliva Overman MD  CC:  had a cough all weekend, achy, chills. No fever, runny nose. Dry cough, no phlegm production, and nasal drainage is clear-greenish.  History of Present Illness: 3 day h/o right maxillary pressure, drainage sometimes yelklow, generalised aches andchills. Prior to this she hd been doing fairly well. her husband rcently had knee surgery and her daughter has a young infant who is currently having health challenges so she does have some increased stress, but is faring well overall. .she does have joint pains and stiffness intermittently in the major weight bearing joints.  Current Medications (verified): 1)  Aspirin 81 Mg  Tbec (Aspirin) .... Take 1 Tablet By Mouth Once A Day 2)  Ibuprofen 800 Mg Tabs (Ibuprofen) .... Take 1 Tablet By Mouth Two Times A Day As Needed 3)  Simvastatin 40 Mg Tabs (Simvastatin) .... One Tab By Mouth Qhs 4)  Ambien Cr 12.5 Mg Cr-Tabs (Zolpidem Tartrate) .... Take 1 Tab By Mouth At Bedtime As Needed 5)  Norvasc 5 Mg Tabs (Amlodipine Besylate) .... Take 1 Tablet Once Daily 6)  Venlafaxine Hcl 37.5 Mg Tabs (Venlafaxine Hcl) .... Take 1 Tablet Two Times A Day 7)  Fish Oil 1000 Mg Caps (Omega-3 Fatty Acids) .... Take 1 Tablet By Mouth Once A Day 8)  Vitamin E 200 Unit Caps  (Vitamin E) .... Take 1 Tablet By Mouth Once A Day 9)  Atenolol 25 Mg Tabs (Atenolol) .... Take 1 Tablet By Mouth Two Times A Day 10)  Hydrochlorothiazide 12.5 Mg Tabs (Hydrochlorothiazide) .... Take 1 Tablet By Mouth Once A Day 11)  Diovan Hct 80-12.5 Mg Tabs (Valsartan-Hydrochlorothiazide) .... Take 1 Tablet By Mouth Once A Day  Allergies (verified): No Known Drug Allergies  Review of Systems      See HPI General:  Complains of chills and fatigue. Eyes:  Denies blurring and discharge. CV:  Denies chest pain or discomfort, palpitations, and swelling of feet. Resp:  Denies cough and sputum productive. GI:  Denies abdominal pain, constipation, diarrhea, nausea, and vomiting. GU:  Denies dysuria and urinary frequency. MS:  Complains of joint pain and stiffness. Derm:  Denies itching and rash. Neuro:  Denies headaches and sensation of room spinning. Psych:  Complains of anxiety and depression; denies sense of great danger, suicidal thoughts/plans, thoughts of violence, and unusual visions or sounds; controlled on meds. Endo:  Denies cold intolerance, excessive hunger, excessive thirst, excessive urination, heat intolerance, polyuria, and weight change. Heme:  Denies abnormal bruising and bleeding. Allergy:  Complains of seasonal allergies; denies hives or rash.  Physical Exam  General:  Well-developed,well-nourished,in no acute distress; alert,appropriate and cooperative  throughout examination HEENT: No facial asymmetry,  EOMI, maxillary  sinus tenderness, TM's Clear, oropharynx  pink and moist.   Chest: Clear to auscultation bilaterally.  CVS: S1, S2, No murmurs, No S3.   Abd: Soft, Nontender.  MS: Adequate ROM spine, hips, shoulders and knees.  Ext: No edema.   CNS: CN 2-12 intact, power tone and sensation normal throughout.   Skin: Intact, no visible lesions or rashes.  Psych: Good eye contact, normal affect.  Memory intact, not anxious or depressed appearing.    Impression &  Recommendations:  Problem # 1:  SINUSITIS, MAXILLARY, ACUTE (ICD-461.0) Assessment Comment Only  Her updated medication list for this problem includes:    Penicillin V Potassium 500 Mg Tabs (Penicillin v potassium) .Marland Kitchen... Take 1 tablet by mouth three times a day    Tessalon Perles 100 Mg Caps (Benzonatate) .Marland Kitchen... Take 1 capsule by mouth three times a day  Orders: Rocephin  250mg  (Z6109) Admin of Therapeutic Inj  intramuscular or subcutaneous (60454)  Problem # 2:  HYPERLIPIDEMIA (ICD-272.4) Assessment: Unchanged  Her updated medication list for this problem includes:    Simvastatin 40 Mg Tabs (Simvastatin) ..... One tab by mouth qhs  Labs Reviewed: SGOT: 18 (09/25/2009)   SGPT: 20 (09/25/2009)   HDL:67 (09/25/2009), 68 (01/02/2009)  LDL:74 (09/25/2009), 66 (01/02/2009)  Chol:154 (09/25/2009), 145 (01/02/2009)  Trig:66 (09/25/2009), 57 (01/02/2009)  Problem # 3:  HYPERTENSION (ICD-401.9) Assessment: Unchanged  The following medications were removed from the medication list:    Diovan Hct 80-12.5 Mg Tabs (Valsartan-hydrochlorothiazide) .Marland Kitchen... Take 1 tablet by mouth once a day Her updated medication list for this problem includes:    Norvasc 5 Mg Tabs (Amlodipine besylate) .Marland Kitchen... Take 1 tablet once daily    Atenolol 25 Mg Tabs (Atenolol) .Marland Kitchen... Take 1 tablet by mouth two times a day    Hydrochlorothiazide 12.5 Mg Tabs (Hydrochlorothiazide) .Marland Kitchen... Take 1 tablet by mouth once a day    Diovan Hct 80-12.5 Mg Tabs (Valsartan-hydrochlorothiazide) .Marland Kitchen... Take 1 tablet by mouth once a day  BP today: 120/78 Prior BP: 118/74 (09/20/2009)  Labs Reviewed: K+: 4.0 (09/25/2009) Creat: : 0.84 (09/25/2009)   Chol: 154 (09/25/2009)   HDL: 67 (09/25/2009)   LDL: 74 (09/25/2009)   TG: 66 (09/25/2009)  Complete Medication List: 1)  Aspirin 81 Mg Tbec (Aspirin) .... Take 1 tablet by mouth once a day 2)  Ibuprofen 800 Mg Tabs (Ibuprofen) .... Take 1 tablet by mouth two times a day as needed 3)   Simvastatin 40 Mg Tabs (Simvastatin) .... One tab by mouth qhs 4)  Ambien Cr 12.5 Mg Cr-tabs (Zolpidem tartrate) .... Take 1 tab by mouth at bedtime as needed 5)  Norvasc 5 Mg Tabs (Amlodipine besylate) .... Take 1 tablet once daily 6)  Venlafaxine Hcl 37.5 Mg Tabs (Venlafaxine hcl) .... Take 1 tablet two times a day 7)  Fish Oil 1000 Mg Caps (Omega-3 fatty acids) .... Take 1 tablet by mouth once a day 8)  Vitamin E 200 Unit Caps (Vitamin e) .... Take 1 tablet by mouth once a day 9)  Atenolol 25 Mg Tabs (Atenolol) .... Take 1 tablet by mouth two times a day 10)  Hydrochlorothiazide 12.5 Mg Tabs (Hydrochlorothiazide) .... Take 1 tablet by mouth once a day 11)  Diovan Hct 80-12.5 Mg Tabs (Valsartan-hydrochlorothiazide) .... Take 1 tablet by mouth once a day 12)  Penicillin V Potassium 500 Mg Tabs (Penicillin v potassium) .... Take 1 tablet by mouth three times a day 13)  Tessalon Perles 100 Mg Caps (Benzonatate) .... Take 1 capsule by mouth three times a day 14)  Fluconazole 150 Mg Tabs (Fluconazole) .... Take 1 tablet by mouth once a day as needed  Patient Instructions: 1)  F/U as before. 2)  You are being treated  for sinusitis, mds are sent in , pls takle all as prescribed. Prescriptions: VENLAFAXINE HCL 37.5 MG TABS (VENLAFAXINE HCL) take 1 tablet two times a day  #60.0 Each x 3   Entered by:   Everitt Amber LPN   Authorized by:   Syliva Overman MD   Signed by:   Everitt Amber LPN on 91/47/8295   Method used:   Electronically to        [facility] [address] [phone]* (retail)       [address] [place], [place]  [address]       Ph: [phone]       Fax: [phone]   RxID:   [id number] FLUCONAZOLE 150 MG TABS (FLUCONAZOLE) Take 1 tablet by mouth once a day as needed  #3 x 0   Entered by:   Everitt Amber LPN   Authorized by:   Syliva Overman MD   Signed by:   Everitt Amber LPN on 63/87/5643   Method used:   Electronically to        [facility] [address] [phone]* (retail)        [address] [place], [place]  [address]       Ph: [phone]       Fax: [phone]   RxID:   [id number] TESSALON PERLES 100 MG CAPS (BENZONATATE) Take 1 capsule by mouth three times a day  #30 x 0   Entered by:   Everitt Amber LPN   Authorized by:   Syliva Overman MD   Signed by:   Everitt Amber LPN on 51/76/1607   Method used:   Electronically to        [facility] [address] [phone]* (retail)       [address] [place]  [address]       Ph: [phone]       Fax: [phone]   RxID:   [id number] PENICILLIN V POTASSIUM 500 MG TABS (PENICILLIN V POTASSIUM) Take 1 tablet by mouth three times a day  #30 x 0   Entered by:   Everitt Amber LPN   Authorized by:   Syliva Overman MD   Signed by:   Everitt Amber LPN on 25/85/2778   Method used:   Electronically to        [facility] [address] [phone]* (retail)       [address] [place], [place]  [address]       Ph: [phone]       Fax: [phone]   RxID:   [id number]    Medication Administration  Injection # 1:    Medication: Rocephin  250mg     Diagnosis: SINUSITIS, MAXILLARY, ACUTE (ICD-461.0)    Route: IM    Site: RUOQ gluteus    Exp Date: 12/2011    Lot #: [id number]    Mfr: novaplus    Comments: 500mg  given     Patient tolerated injection without complications    Given by: Everitt Amber LPN (October 25, 2009 3:39 PM)  Orders Added: 1)  Est. Patient Level IV [19379]  2)  Rocephin  250mg  [J0696] 3)  Admin of Therapeutic Inj  intramuscular or subcutaneous [04540]

## 2010-10-04 NOTE — Assessment & Plan Note (Signed)
Summary: office visit   Vital Signs:  Patient profile:   60 year old female Menstrual status:  hysterectomy Height:      64 inches Weight:      202.25 pounds BMI:     34.84 O2 Sat:      99 % Pulse rate:   101 / minute Pulse rhythm:   regular Resp:     16 per minute BP sitting:   120 / 70  (left arm)  Vitals Entered By: Everitt Amber LPN (April 11, 2010 4:19 PM)  Nutrition Counseling: Patient's BMI is greater than 25 and therefore counseled on weight management options. CC: having a problem with stiffness and all her joints aching   Primary Care Provider:  Syliva Overman MD  CC:  having a problem with stiffness and all her joints aching.  History of Present Illness: c/o generalised jopint pains and stiffness affecting virtual;ly all joints for the past 2 months, at times seeems to be disabling. Reports  thatshe has otherwise been doing fairly  well. Denies recent fever or chills. Denies sinus pressure, nasal congestion , ear pain or sore throat. Denies chest congestion, or cough productive of sputum. Denies chest pain, palpitations, PND, orthopnea or leg swelling. Denies abdominal pain, nausea, vomitting, diarrhea or constipation.  Denies change in bowel movements or bloody stool. Denies dysuria , frequency, incontinence or hesitancy.  Denies headaches, vertigo, seizures.  Denies  rash, lesions, or itch.     Current Medications (verified): 1)  Aspirin 81 Mg  Tbec (Aspirin) .... Take 1 Tablet By Mouth Once A Day 2)  Ibuprofen 800 Mg Tabs (Ibuprofen) .... Take 1 Tablet By Mouth Two Times A Day As Needed 3)  Simvastatin 40 Mg Tabs (Simvastatin) .... One Tab By Mouth Qhs 4)  Ambien Cr 12.5 Mg Cr-Tabs (Zolpidem Tartrate) .... Take 1 Tab By Mouth At Bedtime As Needed 5)  Norvasc 5 Mg Tabs (Amlodipine Besylate) .... Take 1 Tablet Once Daily 6)  Venlafaxine Hcl 37.5 Mg Tabs (Venlafaxine Hcl) .... Take 1 Tablet Two Times A Day 7)  Fish Oil 1000 Mg Caps (Omega-3 Fatty Acids)  .... Take 1 Tablet By Mouth Once A Day 8)  Vitamin E 200 Unit Caps (Vitamin E) .... Take 1 Tablet By Mouth Once A Day 9)  Atenolol 25 Mg Tabs (Atenolol) .... Take 1 Tablet By Mouth Two Times A Day 10)  Hydrochlorothiazide 12.5 Mg Tabs (Hydrochlorothiazide) .... Take 1 Tablet By Mouth Once A Day 11)  Diovan Hct 80-12.5 Mg Tabs (Valsartan-Hydrochlorothiazide) .... Take 1 Tablet By Mouth Once A Day  Allergies (verified): No Known Drug Allergies  Review of Systems      See HPI General:  Complains of fatigue, malaise, and sleep disorder; chronic insomnia and depression, primarily related tp her son, who is currently incarcerated. Eyes:  Denies blurring and discharge. MS:  Complains of joint pain and stiffness; 2 month h/o increased and uncontrolled bilateral knee pain and instability, right worse than left,  buckling has occured, no falls.right ankle swollen, sometimes the shopulders are stiff, and also the hands and fingers get swollen and painful, has had a tough summer with her joints. Psych:  Complains of depression, easily angered, and easily tearful; denies irritability, mental problems, suicidal thoughts/plans, and thoughts of violence. Endo:  Denies excessive hunger and excessive thirst. Heme:  Denies abnormal bruising and bleeding. Allergy:  Complains of seasonal allergies.  Physical Exam  General:  Well-developed,well-nourished,in no acute distress; alert,appropriate and cooperative throughout examination HEENT: No  facial asymmetry,  EOMI, no sinus tenderness, TM's Clear, oropharynx  pink and moist.   Chest: Clear to auscultation bilaterally.  CVS: S1, S2, No murmurs, No S3.   Abd: Soft, Nontender.  MS: Adequate ROM spine, hips, shoulders and knees.  Ext: No edema.   CNS: CN 2-12 intact, power tone and sensation normal throughout.   Skin: Intact, no visible lesions or rashes.  Psych: Good eye contact, normal affect.  Memory intact, depressed appearing.easily  tearful.    Impression & Recommendations:  Problem # 1:  ARTHRITIS (ICD-716.90) Assessment Deteriorated  Orders: Depo- Medrol 80mg  (J1040) Ketorolac-Toradol 15mg  (Z6109) Admin of Therapeutic Inj  intramuscular or subcutaneous (60454)  Problem # 2:  HYPERLIPIDEMIA (ICD-272.4) Assessment: Comment Only  The following medications were removed from the medication list:    Simvastatin 40 Mg Tabs (Simvastatin) ..... One tab by mouth qhs    Lipitor 40 Mg Tabs (Atorvastatin calcium) .Marland Kitchen... Take 1 tab by mouth at bedtime Her updated medication list for this problem includes:    Caduet 5-40 Mg Tabs (Amlodipine-atorvastatin) .Marland Kitchen... Take 1 tab by mouth at bedtime  Labs Reviewed: SGOT: 18 (09/25/2009)   SGPT: 20 (09/25/2009)   HDL:67 (09/25/2009), 68 (01/02/2009)  LDL:74 (09/25/2009), 66 (01/02/2009)  Chol:154 (09/25/2009), 145 (01/02/2009)  Trig:66 (09/25/2009), 57 (01/02/2009)  Problem # 3:  HYPERTENSION (ICD-401.9) Assessment: Unchanged  The following medications were removed from the medication list:    Norvasc 5 Mg Tabs (Amlodipine besylate) .Marland Kitchen... Take 1 tablet once daily Her updated medication list for this problem includes:    Atenolol 25 Mg Tabs (Atenolol) .Marland Kitchen... Take 1 tablet by mouth two times a day    Hydrochlorothiazide 12.5 Mg Tabs (Hydrochlorothiazide) .Marland Kitchen... Take 1 tablet by mouth once a day    Diovan Hct 80-12.5 Mg Tabs (Valsartan-hydrochlorothiazide) .Marland Kitchen... Take 1 tablet by mouth once a day    Caduet 5-40 Mg Tabs (Amlodipine-atorvastatin) .Marland Kitchen... Take 1 tab by mouth at bedtime  Prior BP: 120/78 (10/25/2009)  Labs Reviewed: K+: 4.0 (09/25/2009) Creat: : 0.84 (09/25/2009)   Chol: 154 (09/25/2009)   HDL: 67 (09/25/2009)   LDL: 74 (09/25/2009)   TG: 66 (09/25/2009)  Problem # 4:  DEPRESSION (ICD-311) Assessment: Deteriorated  Her updated medication list for this problem includes:    Venlafaxine Hcl 37.5 Mg Tabs (Venlafaxine hcl) .Marland Kitchen... Take 1 tablet two times a daywould benefit  from therap[y, but refuses  Problem # 5:  OBESITY (ICD-278.00) Assessment: Deteriorated  Ht: 64 (04/11/2010)   Wt: 202.25 (04/11/2010)   BMI: 34.84 (04/11/2010)  Complete Medication List: 1)  Aspirin 81 Mg Tbec (Aspirin) .... Take 1 tablet by mouth once a day 2)  Ibuprofen 800 Mg Tabs (Ibuprofen) .... Take 1 tablet by mouth two times a day as needed 3)  Ambien Cr 12.5 Mg Cr-tabs (Zolpidem tartrate) .... Take 1 tab by mouth at bedtime as needed 4)  Venlafaxine Hcl 37.5 Mg Tabs (Venlafaxine hcl) .... Take 1 tablet two times a day 5)  Fish Oil 1000 Mg Caps (Omega-3 fatty acids) .... Take 1 tablet by mouth once a day 6)  Vitamin E 200 Unit Caps (Vitamin e) .... Take 1 tablet by mouth once a day 7)  Atenolol 25 Mg Tabs (Atenolol) .... Take 1 tablet by mouth two times a day 8)  Hydrochlorothiazide 12.5 Mg Tabs (Hydrochlorothiazide) .... Take 1 tablet by mouth once a day 9)  Diovan Hct 80-12.5 Mg Tabs (Valsartan-hydrochlorothiazide) .... Take 1 tablet by mouth once a day 10)  Ibuprofen 800 Mg  Tabs (Ibuprofen) .... Take 1 tablet by mouth three times a day 11)  Caduet 5-40 Mg Tabs (Amlodipine-atorvastatin) .... Take 1 tab by mouth at bedtime 12)  Voltaren 1 % Gel (Diclofenac sodium) .... Apply twice daily to affected areas as needed for pain  Patient Instructions: 1)  Please schedule a follow-up appointment in 3 months. 2)  It is important that you exercise regularly at least 20 minutes 5 times a week. If you develop chest pain, have severe difficulty breathing, or feel very tired , stop exercising immediately and seek medical attention. 3)  You need to lose weight. Consider a lower calorie diet and regular exercise.  4)  meds  are being sdent in for your arthritis flare. 5)  you will also get injections in the office . 6)  stop the simvastatin pls Prescriptions: VOLTAREN 1 % GEL (DICLOFENAC SODIUM) apply twice daily to affected areas as needed for pain  #300gm x 1   Entered and Authorized by:    Syliva Overman MD   Signed by:   Syliva Overman MD on 04/11/2010   Method used:   Printed then faxed to ...       [facility] (mail-order)             , [place]         Ph: [phone]       Fax: [phone]   RxID:   [id number] CADUET 5-40 MG TABS (AMLODIPINE-ATORVASTATIN) Take 1 tab by mouth at bedtime  #90 x 1   Entered and Authorized by:   Syliva Overman MD   Signed by:   Syliva Overman MD on 04/11/2010   Method used:   Printed then faxed to ...       [facility] (mail-order)             , [place]         Ph: [phone]       Fax: [phone]   RxID:   [id number] LIPITOR 40 MG TABS (ATORVASTATIN CALCIUM) Take 1 tab by mouth at bedtime  #90 x 1   Entered and Authorized by:   Syliva Overman MD   Signed by:   Syliva Overman MD on 04/11/2010   Method used:   Printed then faxed to ...       [facility] (mail-order)             , [place]         Ph: [phone]       Fax: [phone]   RxID:   [id number] IBUPROFEN 800 MG TABS (IBUPROFEN) Take 1 tablet by mouth three times a day  #30 x 0   Entered and Authorized by:   Syliva Overman MD   Signed by:   Syliva Overman MD on 04/11/2010   Method used:   Electronically to        [facility] [address] [phone]* (retail)       [address], [place]  [address]       Ph: [phone]       Fax: [phone]   RxID:   [id number] PREDNISONE (PAK) 10 MG TABS (PREDNISONE) Use as directed  #21 x 0   Entered and Authorized by:   Syliva Overman MD   Signed by:   Syliva Overman MD on 04/11/2010   Method used:   Electronically to        [facility] [address] [phone]* (retail)  [address] [place], [place]  [address]       Ph: [phone]       Fax: [phone]   RxID:   [id number] DIOVAN HCT 80-12.5 MG TABS (VALSARTAN-HYDROCHLOROTHIAZIDE) Take 1 tablet by mouth once a day  #90 x 2   Entered by:   Everitt Amber LPN   Authorized by:   Syliva Overman MD   Signed by:   Everitt Amber LPN on 60/06/9322    Method used:   Faxed to ...       [facility] (mail-order)             , [place]         Ph: [phone]       Fax: [phone]   RxID:   [id number]    Medication Administration  Injection # 1:    Medication: Depo- Medrol 80mg     Diagnosis: ARTHRITIS (ICD-716.90)    Route: IM    Site: RUOQ gluteus    Exp Date: 4/12    Lot #: [id number]    Mfr: Pharmacia    Patient tolerated injection without complications    Given by: Adella Hare LPN (April 11, 2010 5:16 PM)  Injection # 2:    Medication: Ketorolac-Toradol 15mg     Diagnosis: ARTHRITIS (ICD-716.90)    Route: IM    Site: LUOQ gluteus    Exp Date: 11/03/2011    Lot #: [id number]    Mfr: novaplus    Comments: toradol 60mg  given    Patient tolerated injection without complications    Given by: Adella Hare LPN (April 11, 2010 5:16 PM)  Orders Added: 1)  Est. Patient Level IV [27035] 2)  Depo- Medrol 80mg  [J1040] 3)  Ketorolac-Toradol 15mg  [J1885] 4)  Admin of Therapeutic Inj  intramuscular or subcutaneous [00938]

## 2010-10-04 NOTE — Letter (Signed)
Summary: xray  xray   Imported By: Curtis Sites 02/04/2010 11:07:30  _____________________________________________________________________  External Attachment:    Type:   Image     Comment:   External Document

## 2010-10-04 NOTE — Letter (Signed)
Summary: demographic  demographic   Imported By: Curtis Sites 02/04/2010 11:09:54  _____________________________________________________________________  External Attachment:    Type:   Image     Comment:   External Document

## 2010-10-04 NOTE — Progress Notes (Signed)
  Phone Note From Pharmacy   Caller: [facility] [address] [phone]* Summary of Call: Remus Loffler cr 12.5mg  requires prior authorization Initial call taken by: Adella Hare LPN,  October 29, 2009 9:24 AM  Follow-up for Phone Call        pls pa pt has been on this for yrs, send fopr form, i think she definitely wants that over the zolpidem Follow-up by: Syliva Overman MD,  October 29, 2009 9:26 AM  Additional Follow-up for Phone Call Additional follow up Details #1::        PA'd, form in your box Additional Follow-up by: Everitt Amber LPN,  November 02, 2009 4:04 PM

## 2010-10-04 NOTE — Letter (Signed)
Summary: labs  labs   Imported By: Curtis Sites 02/04/2010 11:08:42  _____________________________________________________________________  External Attachment:    Type:   Image     Comment:   External Document

## 2010-10-04 NOTE — Miscellaneous (Signed)
Summary: REFILL  Clinical Lists Changes  Medications: Rx of AMBIEN CR 12.5 MG  TBCR (ZOLPIDEM TARTRATE) Take 1 tab by mouth at bedtime as needed;  #30 x 2;  Signed;  Entered by: Worthy Keeler LPN;  Authorized by: Syliva Overman MD;  Method used: Printed then faxed to [id number] [address] [place], [place], [place]  [address], Ph: [phone] or [phone], Fax: [phone]    Prescriptions: AMBIEN CR 12.5 MG  TBCR (ZOLPIDEM TARTRATE) Take 1 tab by mouth at bedtime as needed  #30 x 2   Entered by:   Worthy Keeler LPN   Authorized by:   Syliva Overman MD   Signed by:   Worthy Keeler LPN on 25/42/7062   Method used:   Printed then faxed to ...       [facility] [address] (retail)       [address]       [place], [place]  [address]       Ph: [phone] or [phone]       Fax: [phone]   RxID:   [id number]

## 2010-10-04 NOTE — Progress Notes (Signed)
Summary: DR. Amanda Pea TEOH  DR. Janeece Riggers WOOI TEOH   Imported By: Lind Guest 09/29/2009 09:40:11  _____________________________________________________________________  External Attachment:    Type:   Image     Comment:   External Document

## 2010-10-04 NOTE — Progress Notes (Signed)
Summary: refill  Phone Note Call from Patient   Summary of Call: needs her [facility]em [facility] at [facility] because she needs it now 90 day supply and then next time it will go to [facility] she is out of this medicine Initial call taken by: Lind Guest,  July 12, 2010 10:33 AM    Prescriptions: AMBIEN CR 12.5 MG CR-TABS (ZOLPIDEM TARTRATE) Take 1 tab by mouth at bedtime as needed Brand medically necessary #90 x 0   Entered by:   Adella Hare LPN   Authorized by:   Syliva Overman MD   Signed by:   Adella Hare LPN on 16/06/9603   Method used:   Printed then faxed to ...       [facility] [address] [phone]* (retail)       [address] [place]  [address]       Ph: [phone]       Fax: [phone]   RxID:   [id number]

## 2010-10-04 NOTE — Letter (Signed)
Summary: phone notes  phone notes   Imported By: Curtis Sites 02/04/2010 10:56:14  _____________________________________________________________________  External Attachment:    Type:   Image     Comment:   External Document

## 2010-10-04 NOTE — Assessment & Plan Note (Signed)
Summary: physical   Vital Signs:  Patient profile:   60 year old female Menstrual status:  hysterectomy Height:      64 inches Weight:      202.25 pounds BMI:     34.84 O2 Sat:      97 % on Room air Pulse rate:   106 / minute Pulse rhythm:   regular Resp:     16 per minute BP supine:   130 / 82  (right arm) BP sitting:   140 / 80  (right arm)  Vitals Entered By: Mauricia Area CMA (July 19, 2010 4:01 PM)  Nutrition Counseling: Patient's BMI is greater than 25 and therefore counseled on weight management options.  O2 Flow:  Room air 1CC: physical. Pain in back when taking deep breaths since yesterday  Vision Screening:Left eye w/o correction: 20 / 25 Right Eye w/o correction: 20 / 30 Both eyes w/o correction:  20/ 25        Vision Entered By: Mauricia Area CMA (July 19, 2010 4:11 PM)   Primary Care Provider:  Syliva Overman MD  CC:  physical. Pain in back when taking deep breaths since yesterday.  History of Present Illness: Reports  that she has been doing well. Denies recent fever or chills. Denies sinus pressure, nasal congestion , ear pain or sore throat. Denies chest congestion, or cough productive of sputum. Denies chest pain, palpitations, PND, orthopnea or leg swelling. Denies abdominal pain, nausea, vomitting, diarrhea or constipation. Denies change in bowel movements or bloody stool. Denies dysuria , frequency, incontinence or hesitancy.  Denies headaches, vertigo, seizures. Denies depression, anxiety or insomnia.Controlled on meds Denies  rash, lesions, or itch.     Current Medications (verified): 1)  Aspirin 81 Mg  Tbec (Aspirin) .... Take 1 Tablet By Mouth Once A Day 2)  Ambien Cr 12.5 Mg Cr-Tabs (Zolpidem Tartrate) .... Take 1 Tab By Mouth At Bedtime As Needed 3)  Venlafaxine Hcl 37.5 Mg Tabs (Venlafaxine Hcl) .... Take 1 Tablet Two Times A Day 4)  Atenolol 25 Mg Tabs (Atenolol) .... Take 1 Tablet By Mouth Two Times A Day 5)  Diovan  Hct 80-12.5 Mg Tabs (Valsartan-Hydrochlorothiazide) .... Take 1 Tablet By Mouth Once A Day 6)  Ibuprofen 800 Mg Tabs (Ibuprofen) .... Take 1 Tablet By Mouth Three Times A Day 7)  Caduet 5-40 Mg Tabs (Amlodipine-Atorvastatin) .... Take 1 Tab By Mouth At Bedtime  Allergies (verified): No Known Drug Allergies  Review of Systems      See HPI General:  Complains of fatigue and sleep disorder. Eyes:  Denies blurring and discharge. MS:  Complains of mid back pain and thoracic pain; 2 day history,aggravated by breathing. Psych:  Complains of anxiety and depression; denies mental problems, suicidal thoughts/plans, thoughts of violence, and unusual visions or sounds; treated . Endo:  Denies excessive thirst and excessive urination. Heme:  Denies abnormal bruising and bleeding. Allergy:  Denies hives or rash and itching eyes.  Physical Exam  General:  Well-developed,well-nourished,in no acute distress; alert,appropriate and cooperative throughout examination Head:  Normocephalic and atraumatic without obvious abnormalities. No apparent alopecia or balding. Eyes:  No corneal or conjunctival inflammation noted. EOMI. Perrla. Funduscopic exam benign, without hemorrhages, exudates or papilledema. Vision grossly normal. Ears:  External ear exam shows no significant lesions or deformities.  Otoscopic examination reveals clear canals, tympanic membranes are intact bilaterally without bulging, retraction, inflammation or discharge. Hearing is grossly normal bilaterally. Nose:  External nasal examination shows no deformity or inflammation.  Nasal mucosa are pink and moist without lesions or exudates. Mouth:  Oral mucosa and oropharynx without lesions or exudates.  Teeth in good repair. Neck:  No deformities, masses, or tenderness noted. Chest Wall:  No deformities, masses, or tenderness noted. Breasts:  No mass, nodules, thickening, tenderness, bulging, retraction, inflamation, nipple discharge or skin changes  noted.   Lungs:  Normal respiratory effort, chest expands symmetrically. Lungs are clear to auscultation, no crackles or wheezes. Heart:  Normal rate and regular rhythm. S1 and S2 normal without gallop, murmur, click, rub or other extra sounds. Abdomen:  Bowel sounds positive,abdomen soft and non-tender without masses, organomegaly or hernias noted. Rectal:  No external abnormalities noted. Normal sphincter tone. No rectal masses or tenderness. Genitalia:  Normal introitus for age, no external lesions, no vaginal discharge, mucosa pink and moist, no vaginal or cervical lesions, o vaginal atrophy, no  no adnexal masses or tenderness Msk:  No deformity or scoliosis noted of thoracic or lumbar spine.   Pulses:  R and L carotid,radial,femoral,dorsalis pedis and posterior tibial pulses are full and equal bilaterally Extremities:  No clubbing, cyanosis, edema, or deformity noted with normal full range of motion of all joints.   Neurologic:  No cranial nerve deficits noted. Station and gait are normal. Plantar reflexes are down-going bilaterally. DTRs are symmetrical throughout. Sensory, motor and coordinative functions appear intact.   Impression & Recommendations:  Problem # 1:  PHYSICAL EXAMINATION (ICD-V70.0) Assessment Comment Only pap sent. Rectal exam reveals no mass and guaic negative stool. seat belt use reiterated regular physical activity , and healthy eating discussed and encouraged  Problem # 2:  OBESITY (ICD-278.00) Assessment: Deteriorated  Ht: 64 (07/19/2010)   Wt: 202.25 (07/19/2010)   BMI: 34.84 (07/19/2010) therapeutic lifestyle change discussed and encouraged  Problem # 3:  HYPERTENSION (ICD-401.9) Assessment: Deteriorated  The following medications were removed from the medication list:    Hydrochlorothiazide 12.5 Mg Tabs (Hydrochlorothiazide) .Marland Kitchen... Take 1 tablet by mouth once a day Her updated medication list for this problem includes:    Atenolol 25 Mg Tabs (Atenolol)  .Marland Kitchen... Take 1 tablet by mouth two times a day    Diovan Hct 80-12.5 Mg Tabs (Valsartan-hydrochlorothiazide) .Marland Kitchen... Take 1 tablet by mouth once a day    Caduet 5-40 Mg Tabs (Amlodipine-atorvastatin) .Marland Kitchen... Take 1 tab by mouth at bedtime Patient advised to follow low sodium diet rich in fruit and vegetables, and to commit to at least 30 minutes 5 days per week of regular exercise , to improve blood presure control.   BP today: 140/80 Prior BP: 120/70 (04/11/2010)  Labs Reviewed: K+: 3.6 (05/13/2010) Creat: : 0.79 (05/13/2010)   Chol: 158 (05/13/2010)   HDL: 76 (05/13/2010)   LDL: 72 (05/13/2010)   TG: 51 (05/13/2010)  Problem # 4:  HYPERLIPIDEMIA (ICD-272.4) Assessment: Unchanged  Her updated medication list for this problem includes:    Caduet 5-40 Mg Tabs (Amlodipine-atorvastatin) .Marland Kitchen... Take 1 tab by mouth at bedtime Low fat dietdiscussed and encouraged  Labs Reviewed: SGOT: 19 (05/13/2010)   SGPT: 24 (05/13/2010)   HDL:76 (05/13/2010), 67 (09/25/2009)  LDL:72 (05/13/2010), 74 (09/25/2009)  Chol:158 (05/13/2010), 154 (09/25/2009)  Trig:51 (05/13/2010), 66 (09/25/2009)  Problem # 5:  HYPERTENSION, BENIGN ESSENTIAL (ICD-401.1)  Complete Medication List: 1)  Aspirin 81 Mg Tbec (Aspirin) .... Take 1 tablet by mouth once a day 2)  Ambien Cr 12.5 Mg Cr-tabs (Zolpidem tartrate) .... Take 1 tab by mouth at bedtime as needed 3)  Atenolol 25 Mg Tabs (Atenolol) .Marland KitchenMarland KitchenMarland Kitchen  Take 1 tablet by mouth two times a day 4)  Diovan Hct 80-12.5 Mg Tabs (Valsartan-hydrochlorothiazide) .... Take 1 tablet by mouth once a day 5)  Ibuprofen 800 Mg Tabs (Ibuprofen) .... Take 1 tablet by mouth three times a day 6)  Caduet 5-40 Mg Tabs (Amlodipine-atorvastatin) .... Take 1 tab by mouth at bedtime 7)  Venlafaxine Hcl 75 Mg Xr24h-cap (Venlafaxine hcl) .... Take 1 capsule by mouth once a day 8)  Fluconazole 150 Mg Tabs (Fluconazole) .... Take 1 tablet by mouth once a day  Patient Instructions: 1)  Please schedule a  follow-up appointment in 4 months. 2)  It is important that you exercise regularly at least 20 minutes 5 times a week. If you develop chest pain, have severe difficulty breathing, or feel very tired , stop exercising immediately and seek medical attention. 3)  You need to lose weight. Consider a lower calorie diet and regular exercise.  4)  pls work on [employer] and more relaxation. 5)  Dose inc on velafxine. 6)  Sample of advil are given take 3 twice daly for the next 5 days, then as neded Prescriptions: FLUCONAZOLE 150 MG TABS (FLUCONAZOLE) Take 1 tablet by mouth once a day  #2 x 0   Entered and Authorized by:   Syliva Overman MD   Signed by:   Syliva Overman MD on 07/24/2010   Method used:   Historical   RxID:   [id number] VENLAFAXINE HCL 75 MG XR24H-CAP (VENLAFAXINE HCL) Take 1 capsule by mouth once a day  #90 x 1   Entered and Authorized by:   Syliva Overman MD   Signed by:   Syliva Overman MD on 07/19/2010   Method used:   Printed then faxed to ...       [facility] [address] [phone]* (retail)       [address] [place], [place]  [address]       Ph: [phone]       Fax: [phone]   RxID:   [id number]    Orders Added: 1)  Est. Patient 40-64 years [phone]

## 2010-10-04 NOTE — Letter (Signed)
Summary: misc  misc   Imported By: Curtis Sites 02/04/2010 11:06:34  _____________________________________________________________________  External Attachment:    Type:   Image     Comment:   External Document

## 2010-10-04 NOTE — Letter (Signed)
[facility]   Imported By: Lind Guest 11/04/2009 11:05:02  _____________________________________________________________________  External Attachment:    Type:   Image     Comment:   External Document

## 2010-10-04 NOTE — Assessment & Plan Note (Signed)
Summary: flu shot  Nurse Visit   Allergies: No Known Drug Allergies  Immunizations Administered:  Influenza Vaccine # 1:    Vaccine Type: Fluvax Non-MCR    Site: left deltoid    Mfr: novartis    Dose: 0.5 ml    Route: IM    Given by: Adella Hare LPN    Exp. Date: 01/2011    Lot #: [id number]    VIS given: 03/29/10 version given May 13, 2010.  Orders Added: 1)  Influenza Vaccine NON MCR [00028]

## 2010-10-04 NOTE — Letter (Signed)
Summary: progress notes  progress notes   Imported By: Curtis Sites 02/04/2010 10:53:00  _____________________________________________________________________  External Attachment:    Type:   Image     Comment:   External Document

## 2010-10-04 NOTE — Letter (Signed)
Summary: consults  consults   Imported By: Curtis Sites 02/04/2010 11:00:09  _____________________________________________________________________  External Attachment:    Type:   Image     Comment:   External Document

## 2010-10-04 NOTE — Medication Information (Signed)
Summary: Tax adviser   Imported By: Lind Guest 07/22/2010 14:54:06  _____________________________________________________________________  External Attachment:    Type:   Image     Comment:   External Document

## 2010-10-04 NOTE — Letter (Signed)
Summary: history and physical  history and physical   Imported By: Curtis Sites 02/04/2010 10:50:45  _____________________________________________________________________  External Attachment:    Type:   Image     Comment:   External Document

## 2010-10-06 NOTE — Progress Notes (Signed)
  Phone Note Call from Patient   Caller: Patient Summary of Call: cough, sore throat, stuffy nose, green sputum  advised urgent care and patient agrees Initial call taken by: Adella Hare LPN,  August 26, 2010 8:42 AM

## 2010-10-07 NOTE — Medication Information (Signed)
Summary: Tax adviser   Imported By: Lind Guest 11/04/2009 11:04:37  _____________________________________________________________________  External Attachment:    Type:   Image     Comment:   External Document

## 2010-10-24 ENCOUNTER — Telehealth: Payer: Self-pay | Admitting: Family Medicine

## 2010-11-01 NOTE — Progress Notes (Signed)
Summary: medicine  Phone Note Call from Patient   Summary of Call: needs a refill on her zolpidem please call [facility] at [phone] Initial call taken by: Lind Guest,  October 24, 2010 1:39 PM  Follow-up for Phone Call        Rx Called In Follow-up by: Adella Hare LPN,  October 24, 2010 1:59 PM    Prescriptions: AMBIEN CR 12.5 MG CR-TABS (ZOLPIDEM TARTRATE) Take 1 tab by mouth at bedtime as needed Brand medically necessary #90 x 0   Entered by:   Adella Hare LPN   Authorized by:   Syliva Overman MD   Signed by:   Adella Hare LPN on 47/82/9562   Method used:   Printed then faxed to ...       [facility] [address] [phone]* (retail)       [address] [place]  [address]       Ph: [phone]       Fax: [phone]   RxID:   [id number]

## 2010-11-10 ENCOUNTER — Encounter: Payer: Self-pay | Admitting: Family Medicine

## 2010-11-15 ENCOUNTER — Ambulatory Visit (INDEPENDENT_AMBULATORY_CARE_PROVIDER_SITE_OTHER): Payer: BC Managed Care – PPO | Admitting: Family Medicine

## 2010-11-15 ENCOUNTER — Encounter: Payer: Self-pay | Admitting: Family Medicine

## 2010-11-15 DIAGNOSIS — I10 Essential (primary) hypertension: Secondary | ICD-10-CM

## 2010-11-15 DIAGNOSIS — E785 Hyperlipidemia, unspecified: Secondary | ICD-10-CM

## 2010-11-15 DIAGNOSIS — F329 Major depressive disorder, single episode, unspecified: Secondary | ICD-10-CM

## 2010-11-16 ENCOUNTER — Other Ambulatory Visit: Payer: Self-pay | Admitting: Family Medicine

## 2010-11-16 DIAGNOSIS — Z139 Encounter for screening, unspecified: Secondary | ICD-10-CM

## 2010-11-21 ENCOUNTER — Other Ambulatory Visit (HOSPITAL_COMMUNITY): Payer: BC Managed Care – PPO

## 2010-11-23 ENCOUNTER — Telehealth: Payer: Self-pay | Admitting: Family Medicine

## 2010-11-24 NOTE — Telephone Encounter (Signed)
pls address this as a priority, she was here last week , and that was her main concern, thank

## 2010-12-01 NOTE — Assessment & Plan Note (Signed)
Summary: F UP   Vital Signs:  Patient profile:   60 year old female Menstrual status:  hysterectomy Height:      64 inches Weight:      199.25 pounds BMI:     34.32 O2 Sat:      98 % Pulse rate:   83 / minute Pulse rhythm:   regular Resp:     16 per minute BP sitting:   138 / 80  (right arm)  Vitals Entered By: Everitt Amber LPN (November 15, 2010 4:26 PM)  Nutrition Counseling: Patient's BMI is greater than 25 and therefore counseled on weight management options. CC: Follow up chronic problems Pain Assessment Patient in pain? no        Primary Care Provider:  Syliva Overman MD  CC:  Follow up chronic problems.  History of Present Illness: Reports  that she is doing fairly well. Denies recent fever or chills. Denies sinus pressure, nasal congestion , ear pain or sore throat. Denies chest congestion, or cough productive of sputum. Denies chest pain, palpitations, PND, orthopnea or leg swelling. Denies abdominal pain, nausea, vomitting, diarrhea or constipation. Denies change in bowel movements or bloody stool. Denies dysuria , frequency, incontinence or hesitancy. Chronic generalised   joint pain and stiffness, no significant  swelling, or reduced mobility. Denies headaches, vertigo, seizures. Denies uncontrolled  depression, anxiety or insomnia. Denies  rash, lesions, or itch.     Allergies: No Known Drug Allergies  Review of Systems      See HPI General:  Complains of sleep disorder; chronic insomnia despite good sleep hygiene. Eyes:  Denies discharge, eye pain, and red eye. MS:  Complains of joint pain and stiffness. Psych:  Complains of anxiety and depression; denies mental problems, suicidal thoughts/plans, thoughts of violence, and unusual visions or sounds; improved on meds. Endo:  Denies cold intolerance, excessive hunger, excessive thirst, excessive urination, and polyuria. Heme:  Denies abnormal bruising, bleeding, and enlarge lymph nodes. Allergy:   Complains of seasonal allergies; denies hives or rash and itching eyes.  Physical Exam  General:  Well-developed,well-nourished,in no acute distress; alert,appropriate and cooperative throughout examination HEENT: No facial asymmetry,  EOMI, No sinus tenderness, TM's Clear, oropharynx  pink and moist.   Chest: Clear to auscultation bilaterally.  CVS: S1, S2, No murmurs, No S3.   Abd: Soft, Nontender.  MS: Adequate ROM spine, hips, shoulders and knees.  Ext: No edema.   CNS: CN 2-12 intact, power tone and sensation normal throughout.   Skin: Intact, no visible lesions or rashes.  Psych: Good eye contact, normal affect.  Memory intact, not anxious or depressed appearing.    Impression & Recommendations:  Problem # 1:  HYPERTENSION (ICD-401.9) Assessment Unchanged  Her updated medication list for this problem includes:    Atenolol 25 Mg Tabs (Atenolol) .Marland Kitchen... Take 1 tablet by mouth two times a day    Diovan Hct 80-12.5 Mg Tabs (Valsartan-hydrochlorothiazide) .Marland Kitchen... Take 1 tablet by mouth once a day    Caduet 5-40 Mg Tabs (Amlodipine-atorvastatin) .Marland Kitchen... Take 1 tab by mouth at bedtime  Orders: T-Basic Metabolic Panel [phone])  BP today: 138/80 Prior BP: 130/82 (07/19/2010)  Labs Reviewed: K+: 3.6 (05/13/2010) Creat: : 0.79 (05/13/2010)   Chol: 158 (05/13/2010)   HDL: 76 (05/13/2010)   LDL: 72 (05/13/2010)   TG: 51 (05/13/2010)  Problem # 2:  HYPERLIPIDEMIA (ICD-272.4) Assessment: Comment Only  Her updated medication list for this problem includes:    Caduet 5-40 Mg Tabs (Amlodipine-atorvastatin) .Marland Kitchen... Take  1 tab by mouth at bedtime Low fat dietdiscussed and encouraged  Orders: T-Hepatic Function [phone]) T-Lipid Profile [phone])  Labs Reviewed: SGOT: 19 (05/13/2010)   SGPT: 24 (05/13/2010)   HDL:76 (05/13/2010), 67 (09/25/2009)  LDL:72 (05/13/2010), 74 (09/25/2009)  Chol:158 (05/13/2010), 154 (09/25/2009)  Trig:51 (05/13/2010), 66 (09/25/2009)  Problem #  3:  DEPRESSION (ICD-311) Assessment: Improved  Her updated medication list for this problem includes:    Venlafaxine Hcl 75 Mg Xr24h-cap (Venlafaxine hcl) .Marland Kitchen... Take 1 capsule by mouth once a day  Problem # 4:  INSOMNIA (ICD-780.52) Assessment: Unchanged  Her updated medication list for this problem includes:    Ambien Cr 12.5 Mg Cr-tabs (Zolpidem tartrate) .Marland Kitchen... Take 1 tab by mouth at bedtime as needed  Discussed sleep hygiene.   Problem # 5:  ANXIETY (ICD-300.00) Assessment: Improved  Her updated medication list for this problem includes:    Venlafaxine Hcl 75 Mg Xr24h-cap (Venlafaxine hcl) .Marland Kitchen... Take 1 capsule by mouth once a day  Complete Medication List: 1)  Aspirin 81 Mg Tbec (Aspirin) .... Take 1 tablet by mouth once a day 2)  Ambien Cr 12.5 Mg Cr-tabs (Zolpidem tartrate) .... Take 1 tab by mouth at bedtime as needed 3)  Atenolol 25 Mg Tabs (Atenolol) .... Take 1 tablet by mouth two times a day 4)  Diovan Hct 80-12.5 Mg Tabs (Valsartan-hydrochlorothiazide) .... Take 1 tablet by mouth once a day 5)  Ibuprofen 800 Mg Tabs (Ibuprofen) .... Take 1 tablet by mouth three times a day 6)  Caduet 5-40 Mg Tabs (Amlodipine-atorvastatin) .... Take 1 tab by mouth at bedtime 7)  Venlafaxine Hcl 75 Mg Xr24h-cap (Venlafaxine hcl) .... Take 1 capsule by mouth once a day 8)  Certrizine 10mg   .... Take 1 tablet by mouth once a day  Other Orders: T- Hemoglobin A1C ([id number]) T-Vitamin D (25-Hydroxy) [phone]) T-CBC w/Diff ([id number]) Future Orders: Radiology Referral (Radiology) ... 11/16/2010  Patient Instructions: 1)  Please schedule a follow-up appointment in 4 months. 2)  It is important that you exercise regularly at least 20 minutes 5 times a week. If you develop chest pain, have severe difficulty breathing, or feel very tired , stop exercising immediately and seek medical attention. 3)  You need to lose weight. Consider a lower calorie diet and regular exercise.  4)  BMP  prior to visit, ICD-9: 5)  Hepatic Panel prior to visit, ICD-9:  fasting asap 6)  Lipid Panel prior to visit, ICD-9: 7)  HbgA1C prior to visit, ICD-9: and Vit D  8)  cBC 9)  you are referred fior a bone density test. Prescriptions: CERTRIZINE 10MG  Take 1 tablet by mouth once a day  #90 x 1   Entered and Authorized by:   Syliva Overman MD   Signed by:   Syliva Overman MD on 11/15/2010   Method used:   Print then Give to Patient   RxID:   [id number]    Orders Added: 1)  Est. Patient Level IV [51884] 2)  T-Basic Metabolic Panel [[id number]] 3)  T-Hepatic Function [80076-22960] 4)  T-Lipid Profile [80061-22930] 5)  T- Hemoglobin A1C [83036-23375] 6)  T-Vitamin D (25-Hydroxy) [10932-35573] 7)  T-CBC w/Diff [22025-42706] 8)  Radiology Referral [Radiology]

## 2010-12-09 ENCOUNTER — Other Ambulatory Visit: Payer: Self-pay | Admitting: Family Medicine

## 2010-12-09 LAB — HEMOGLOBIN A1C: Hgb A1c MFr Bld: 6.2 % — ABNORMAL HIGH (ref <5.7)

## 2010-12-09 LAB — CBC WITH DIFFERENTIAL/PLATELET
Basophils Absolute: 0 10*3/uL (ref 0.0–0.1)
Basophils Relative: 0 % (ref 0–1)
Eosinophils Absolute: 0.2 10*3/uL (ref 0.0–0.7)
Lymphs Abs: 2 10*3/uL (ref 0.7–4.0)
MCH: 29.8 pg (ref 26.0–34.0)
MCHC: 33.3 g/dL (ref 30.0–36.0)
Neutrophils Relative %: 42 % — ABNORMAL LOW (ref 43–77)
Platelets: 274 10*3/uL (ref 150–400)
RBC: 4.26 MIL/uL (ref 3.87–5.11)
RDW: 13.5 % (ref 11.5–15.5)

## 2010-12-10 LAB — LIPID PANEL
Cholesterol: 142 mg/dL (ref 0–200)
HDL: 71 mg/dL (ref >39)
Total CHOL/HDL Ratio: 2 Ratio
Triglycerides: 83 mg/dL (ref <150)

## 2010-12-10 LAB — BASIC METABOLIC PANEL
Calcium: 9.2 mg/dL (ref 8.4–10.5)
Sodium: 144 mEq/L (ref 135–145)

## 2010-12-10 LAB — HEPATIC FUNCTION PANEL
ALT: 24 U/L (ref 0–35)
AST: 21 U/L (ref 0–37)
Albumin: 4 g/dL (ref 3.5–5.2)
Alkaline Phosphatase: 98 U/L (ref 39–117)

## 2010-12-13 ENCOUNTER — Ambulatory Visit (HOSPITAL_COMMUNITY)
Admission: RE | Admit: 2010-12-13 | Discharge: 2010-12-13 | Disposition: A | Discharge status: Home / Self Care | Payer: BC Managed Care – PPO | Source: Ambulatory Visit | Attending: Family Medicine | Admitting: Family Medicine

## 2010-12-13 DIAGNOSIS — Z139 Encounter for screening, unspecified: Secondary | ICD-10-CM

## 2010-12-13 DIAGNOSIS — M899 Disorder of bone, unspecified: Secondary | ICD-10-CM | POA: Insufficient documentation

## 2010-12-14 ENCOUNTER — Other Ambulatory Visit: Payer: Self-pay

## 2010-12-14 DIAGNOSIS — R7301 Impaired fasting glucose: Secondary | ICD-10-CM

## 2010-12-14 MED ORDER — VITAMIN D (ERGOCALCIFEROL) 1.25 MG (50000 UNIT) PO CAPS: 50000.0000 [IU] | ORAL_CAPSULE | ORAL | Status: DC

## 2010-12-14 NOTE — Progress Notes (Signed)
Normal letter sent

## 2010-12-16 ENCOUNTER — Encounter: Payer: Self-pay | Admitting: Family Medicine

## 2011-01-06 ENCOUNTER — Other Ambulatory Visit: Payer: Self-pay | Admitting: Family Medicine

## 2011-01-12 ENCOUNTER — Other Ambulatory Visit: Payer: Self-pay

## 2011-01-12 MED ORDER — ATENOLOL 25 MG PO TABS: ORAL_TABLET | ORAL | Status: DC

## 2011-01-17 NOTE — Letter (Signed)
March 17, 2009    Milus Mallick. Lodema Hong, MD  [address], [address]  [place], [place] [address]   RE:  SUHEY, RADFORD  MRN:  [id number]  /  DOB:  06-08-1951   Dear Claris Che,   It was my pleasure evaluating Ms. Burley in the office today in  consultation at your request.  As you know, this nice woman has had a  long history of chest discomfort, first evaluated by Dr. Domingo Sep in  2004.  An echocardiogram at that time was normal except for the presence  of mild LVH.  A stress nuclear study showed impaired exercise tolerance,  an electrocardiographically positive response, but normal myocardial  perfusion.  Ms. Hoerner continues to report chest discomfort when she  overexerts.  This is promptly relieved with rest.  It is fairly mild and  midsternal in location.  There is no radiation.  There are no associated  symptoms.  She experiences this problem irregularly and fairly  infrequently.  She also describes excessive fatigue.  She has a  sedentary lifestyle, but is quite busy in terms of various activities  including caring for an ill parent.   She also has a history of hyperlipidemia and hypertension.  Both of  these have been very well treated.  She has had a long-standing problem  with excessive weight.   She also is concerned about discomfort in her hands.  She describes  episodes of coldness, sometimes in the fingertips, sometimes involving  much of the hand, and sometimes moving up the forearm.  These occur  unpredictably, but were worse during the winter.  She experiences at  least 1 episode per day, typically lasting minutes.  She massages her  hands with improvement.  She does not note any change in skin color.  She has never had any circulatory problems or injuries to the upper  extremity.   Past surgical history is notable for a hysterectomy.  She also has been  told mitral valve prolapse in the past and experiences some arthritic  discomfort.   She reports no  allergies.   Current medications include:  1. Simvastatin 40 mg daily.  2. Atenolol 75 mg daily.  3. Ibuprofen 800 mg b.i.d.  4. Aspirin 81 mg daily.  5. Diovan HCT 80 mg daily.  6. Vitamin E.  7. Fish oil.  8. She uses Ambien p.r.n.   SOCIAL HISTORY:  Employed as a Runner, broadcasting/film/video in the [employer]; hobbies  include reading, playing the piano, and gardening.  She is married with  2 adult children.   FAMILY HISTORY:  Father died at age 10 with congestive heart failure and  neoplastic disease.  Mother is alive at age 87 with arthritis,  hyperlipidemia, and hypertension.  Of 6 siblings, one brother has Crohn  disease, one brother has diabetes, and one sister has hypertension and  coronary artery disease.   Review of systems is notable for arthritic discomfort.  Otherwise, all  systems are negative.   PHYSICAL EXAMINATION:  GENERAL:  Very pleasant, somewhat overweight  woman in no acute distress.  VITAL SIGNS:  The weight is 198, height is 5 feet 4 inches, blood  pressure 135/80 in both arms sitting, heart rate 90 and regular,  respirations 14 and unlabored.  HEENT:  Mild arcus bilaterally; EOMs full; normal oral mucosa.  NECK:  No jugular venous distention; normal carotid upstrokes without  bruits.  ENDOCRINE:  No thyromegaly.  HEMATOPOIETIC:  No adenopathy.  SKIN:  No significant  lesions.  CARDIAC:  Normal first and second heart sounds; grade 1-2/6 systolic  ejection murmur at the cardiac base.  LUNGS:  Clear.  ABDOMEN:  Soft and nontender; normal bowel sounds; no organomegaly.  EXTREMITIES:  No edema; normal distal pulses; normal pulses in the upper  extremities; borderline Allen test; normal sensation; mildly  hyporeflexic in the upper extremities.   EKG:  Normal sinus rhythm; prominent voltage; otherwise within normal  limits.   Orthostatic vital signs were performed due to the patient's history of  mild orthostatic dizziness.  There was no significant drop in blood   pressure with standing.   IMPRESSION:  Ms. Toomey appears to be doing well overall.  We discussed  her stressful lifestyle, excessive weight, and lack of exercise.  I  suggested that she walk on a daily basis.  She is motivated to do this,  but has not been able to find the time to do so.  Hypertension is under  good control.  I reviewed laboratory from your office, which indicates  that hyperlipidemia is also well controlled.  TSH was normal.   Her chest discomfort is atypical and long-standing.  I doubt this  represents ischemic heart disease.  We will proceed with a stress  echocardiogram as per our telephone discussion on the day I saw her.   Her discomfort in her hands is unlikely to be vascular except possibly  Raynaud's.  It is certainly not classic for that diagnosis, but a trial  of amlodipine is benign and reasonable.  We will decrease atenolol to 25  mg daily and start amlodipine 5 mg daily.  If any etiology is to be  found for these symptoms, I think most likely that this would represent  a neurologic problem.   I will reassess Ms. Tancredi's symptoms in 1 month.  I do not anticipate  the need for routine cardiology followup subsequent to that visit.  Thank you so much for sending her to see me.    Sincerely,      Gerrit Friends. Dietrich Pates, MD, [facility]  Electronically Signed    RMR/MedQ  DD: 03/17/2009  DT: 03/18/2009  Job #: [id number]

## 2011-01-18 ENCOUNTER — Other Ambulatory Visit: Payer: Self-pay | Admitting: Family Medicine

## 2011-01-20 NOTE — Procedures (Signed)
   NAME:  Alexis Hurst, Alexis Hurst NO.:  [id number]   MEDICAL RECORD NO.:  [id number]                    PATIENT TYPE:  PREC   LOCATION:                                       FACILITY:   PHYSICIAN:  Dani Gobble, MD                    DATE OF BIRTH:   DATE OF PROCEDURE:  03/19/2003  DATE OF DISCHARGE:                                    STRESS TEST   REFERRING PHYSICIANS:  Milus Mallick. Lodema Hong, M.D. and Dani Gobble, MD   PROCEDURE:  Stress portion of a nuclear stress test   INDICATIONS:  This patient is a 60 year old female with past medical history  of hypertension who has multiple risk factors for coronary artery disease  who has been experiencing chest discomfort.  She is referred for nuclear  stress test to evaluate for the possibility of cardiac ischemia as the  etiology for her chest discomfort.   Baseline 12-lead electrocardiogram revealed normal sinus rhythm with early  repolarization. Her baseline blood pressure was 128/80 mmHg with a heart  rate of 87 beats/minute.  She walked for 7 minutes and 58 seconds on a full  Bruce protocol. She did not achieve her target heart rate of 145  beats/minute.  Her peak heart rate was 140 beats/minute.  Peak blood  pressure was 182/80 mmHg.  She did reproduce the chest tightness at peak  heart rate, but it was most notable during recovery.  EKG during peak heart  rate revealed 1-2 mm downsloping ST depression inferiorly with 1 mm ST  depression which was somewhat upsloping in V4-V6.  No significant  arrhythmias were appreciated.  Her EKG during recovery was notable for 1 mm  ST depression inferiorly and 1 mm horizontal ST depression in V4-V6.  Recovery to a heart rate of less than 100 required more than 8 minutes.  Chest discomfort resolved within 5-7 minutes.   EXERCISE STRESS TEST IMPRESSION:  Positive Bruce protocol exercise stress  test at a submaximal target heart rate.   IMPRESSION:  1. Clinically positive  for angina.  2. Electrocardiogram positive for ischemia.  3. Fair overall exercise tolerance.  4. Sonographic images are pending.                                               Dani Gobble, MD   AB/MEDQ  D:  03/19/2003  T:  03/20/2003  Job:  [id number]   cc:   Milus Mallick. Lodema Hong, M.D.  [address]  [place], [place] [address]  Fax: [phone]

## 2011-01-20 NOTE — Procedures (Signed)
NAME:  Alexis Hurst, Alexis Hurst                        ACCOUNT NO.:  [id number]   MEDICAL RECORD NO.:  [id number]                   PATIENT TYPE:  PREC   LOCATION:                                       FACILITY:   PHYSICIAN:  Dani Gobble, MD                    DATE OF BIRTH:   DATE OF PROCEDURE:  03/19/2003  DATE OF DISCHARGE:                                  ECHOCARDIOGRAM   PROCEDURE:  Echocardiogram   REFERRING PHYSICIANS:  Milus Mallick. Lodema Hong, M.D. and Dani Gobble, MD   INDICATIONS:  [name] is a 60 year old female with a past medical history  of hypertension and a history of mitral valve prolapse who has been  experiencing chest pain.   TECHNICAL QUALITY:  The technical quality of this study is adequate.   FINDINGS:  1. The aorta is within normal limits at 2.9 cm.  2. The left atrium is also within normal limits at 3.8 cm.  No obvious clots     or masses were appreciated.  The patient appeared to be in sinus rhythm     during this procedure.  3. The interventricular septum and posterior wall were mildly thickened at     1.3 cm and 1.2 cm, respectively.  4. The aortic valve appeared grossly structurally normal with normal leaflet     excursion.  No aortic insufficiency was noted.  Doppler interrogation of     the aortic valve was within normal limits.  5. The mitral valve was structurally normal.  No mitral valve prolapse was     appreciated.  Trivial mitral regurgitation was present.  Doppler     interrogation of the mitral valve was within normal limits.  6. The pulmonic valve was incompletely visualized, but appeared to be     grossly structurally normal.  7. The tricuspid valve also appeared structurally normal.  Pulmonary     pressures were normal.  Mild tricuspid regurgitation.  8. The left ventricular was normal in size with the LVIDD measured at 3.4 cm     and the LVISD measured at 2.2 cm.  Overall left ventricular systolic     function was vigorous and no regional  wall motion abnormalities were     appreciated.  She did exhibit basal septal hypertrophy in addition to the     mild concentric LVH.  The right atrium and right ventricle were normal in     size and the right ventricular systolic function was normal.  The IVC was     normal in size with good collapse.   IMPRESSION:  1. Moderate concentric left ventricular hypertrophy with additional basal     septal hypertrophy as well.  2. Normal left ventricular chamber size.  3. A vigorous left ventricular systolic function.  4. No regional wall motion abnormalities noted.  5.     Normal valvular function.  6. No  mitral valve prolapse.  7. Trivial mitral and mild tricuspid regurgitation.                                               Dani Gobble, MD    AB/MEDQ  D:  03/19/2003  T:  03/20/2003  Job:  [id number]   cc:   Milus Mallick. Lodema Hong, M.D.  [address]  [place], [place] [address]  Fax: [phone]

## 2011-01-20 NOTE — H&P (Signed)
   NAMEJAQUITA, Alexis Hurst                          ACCOUNT NO.:  [id number]   MEDICAL RECORD NO.:  [id number]                    PATIENT TYPE:   LOCATION:                                       FACILITY:   PHYSICIAN:  Dirk Dress. Katrinka Blazing, M.D.                DATE OF BIRTH:   DATE OF ADMISSION:  DATE OF DISCHARGE:                                HISTORY & PHYSICAL   HISTORY OF PRESENT ILLNESS:  A 60 year old female with a family history of  colon cancer, referred for screening colonoscopy.  She has no difficulty  with bowel movements.  There has been no history of active bleeding.  She  denies any pain.   PAST MEDICAL HISTORY:  Positive for hypertension.   MEDICATIONS:  1. Atenolol 50 mg 1.5 tablets daily.  2. Diovan HCT 80/12.5 mg daily.  3. Coumadin 0.3 mg q.o.d.  4. Ambien 10 mg q.h.s.   PAST SURGICAL HISTORY:  Hysterectomy and left breast biopsy.   PHYSICAL EXAMINATION:  VITAL SIGNS:  Blood pressure 130/84, respirations 20.  GENERAL:  Weight 196 pounds.  HEENT:  Unremarkable.  NECK:  Supple.  CHEST:  Clear.  HEART:  Regular rate and rhythm without murmur, gallop or rub.  ABDOMEN:  Soft, nontender, no masses.  EXTREMITIES:  No cyanosis, clubbing or edema.  NEUROLOGIC:  No focal motor, sensory or cerebellar deficit.   IMPRESSIONS:  1. Need for screening colonoscopy.  2. Family history of colon cancer.  3. Hypertension.   PLAN:  Screening colonoscopy.      ___________________________________________                                         Dirk Dress. Katrinka Blazing, M.D.   LCS/MEDQ  D:  06/11/2003  T:  06/11/2003  Job:  [id number]

## 2011-01-31 ENCOUNTER — Encounter: Payer: Self-pay | Admitting: Family Medicine

## 2011-01-31 ENCOUNTER — Ambulatory Visit (INDEPENDENT_AMBULATORY_CARE_PROVIDER_SITE_OTHER): Payer: BC Managed Care – PPO | Admitting: Family Medicine

## 2011-01-31 VITALS — BP 130/80 | HR 99 | Resp 16 | Ht 64.0 in | Wt 204.1 lb

## 2011-01-31 DIAGNOSIS — I10 Essential (primary) hypertension: Secondary | ICD-10-CM

## 2011-01-31 DIAGNOSIS — F3289 Other specified depressive episodes: Secondary | ICD-10-CM

## 2011-01-31 DIAGNOSIS — F329 Major depressive disorder, single episode, unspecified: Secondary | ICD-10-CM

## 2011-01-31 DIAGNOSIS — G4700 Insomnia, unspecified: Secondary | ICD-10-CM

## 2011-01-31 DIAGNOSIS — Z13820 Encounter for screening for osteoporosis: Secondary | ICD-10-CM

## 2011-01-31 DIAGNOSIS — R7303 Prediabetes: Secondary | ICD-10-CM

## 2011-01-31 DIAGNOSIS — R7309 Other abnormal glucose: Secondary | ICD-10-CM

## 2011-01-31 DIAGNOSIS — E669 Obesity, unspecified: Secondary | ICD-10-CM

## 2011-01-31 DIAGNOSIS — J029 Acute pharyngitis, unspecified: Secondary | ICD-10-CM

## 2011-01-31 MED ORDER — VENLAFAXINE HCL ER 75 MG PO CP24: 75.0000 mg | ORAL_CAPSULE | Freq: Every day | ORAL | Status: DC

## 2011-01-31 MED ORDER — ATENOLOL 25 MG PO TABS: ORAL_TABLET | ORAL | Status: DC

## 2011-01-31 MED ORDER — AMLODIPINE-ATORVASTATIN 5-40 MG PO TABS: 1.0000 | ORAL_TABLET | Freq: Every day | ORAL | Status: DC

## 2011-01-31 MED ORDER — VALSARTAN-HYDROCHLOROTHIAZIDE 80-12.5 MG PO TABS: 1.0000 | ORAL_TABLET | Freq: Every day | ORAL | Status: DC

## 2011-01-31 MED ORDER — PENICILLIN V POTASSIUM 500 MG PO TABS: 500.0000 mg | ORAL_TABLET | Freq: Three times a day (TID) | ORAL | Status: AC

## 2011-01-31 NOTE — Patient Instructions (Addendum)
F/u in 3 months.  Fasting chem 7, hBA1c and vit D level in 3 months.  It is important that you exercise regularly at least 30 minutes 5 times a week. If you develop chest pain, have severe difficulty breathing, or feel very tired, stop exercising immediately and seek medical attention   A healthy diet is rich in fruit, vegetables and whole grains. Poultry fish, nuts and beans are a healthy choice for protein rather then red meat. A low sodium diet and drinking 64 ounces of water daily is generally recommended. Oils and sweet should be limited. Carbohydrates especially for those who are diabetic or overweight, should be limited to 34-45 gram per meal. It is important to eat on a regular schedule, at least 3 times daily. Snacks should be primarily fruits, vegetables or nuts.   pls liimit each meal to 35 to 45 gm carbohydrate, eat regularly, and the sweet tea needs to go   Penicillin is sent in for pharyngitis and sinus drainage

## 2011-02-05 NOTE — Assessment & Plan Note (Signed)
Acute episode antibiotic prescribed

## 2011-02-05 NOTE — Assessment & Plan Note (Signed)
Controlled, no change in medication  

## 2011-02-05 NOTE — Progress Notes (Signed)
  Subjective:    Patient ID: Alexis Hurst, female    DOB: 1950/10/20, 60 y.o.   MRN: [id number]  HPI 3 day h/o sore throat with tender swollen neck glands and myalgias. She denies nasal congestion or sinus pressure. She has had chills but no documented fever.She reports post nasal drainage She has no productive cough. Otherwise well, though inconsistent in lifestyle changes to promote improved health and weight loss.    Review of Systems . Denies chest pains, palpitations, paroxysmal nocturnal dyspnea, orthopnea and leg swelling Denies abdominal pain, nausea, vomiting,diarrhea or constipation.  Denies rectal bleeding or change in bowel movement. Denies dysuria, frequency, hesitancy or incontinence. Chronic generalized joint pain, swelling and limitation in mobility. Denies headaches, seizure, numbness, or tingling. Denies uncontrolled  depression, anxiety or insomnia. Denies skin break down or rash.        Objective:   Physical Exam Patient alert and oriented and in no Cardiopulmonary distress.  HEENT: No facial asymmetry, EOMI, no sinus tenderness, TM's clear, Oropharynx erythematous, no exudate, anterior cervical adenitis.  Chest: Clear to auscultation bilaterally.  CVS: S1, S2 no murmurs, no S3.  ABD: Soft non tender. Bowel sounds normal.  Ext: No edema  MS: Adequate ROM spine, shoulders, hips and knees.  Skin: Intact, no ulcerations or rash noted.  Psych: Good eye contact, normal affect. Memory intact not anxious or depressed appearing.  CNS: CN 2-12 intact, power, tone and sensation normal throughout.        Assessment & Plan:

## 2011-02-05 NOTE — Assessment & Plan Note (Signed)
Controlled, no change in medication Sleep hygiene discussed 

## 2011-02-05 NOTE — Assessment & Plan Note (Signed)
Deteriorated. Patient re-educated about  the importance of commitment to a  minimum of 150 minutes of exercise per week. The importance of healthy food choices with portion control discussed. Encouraged to start a food diary, count calories and to consider  joining a support group. Sample diet sheets offered. Goals set by the patient for the next several months.    

## 2011-02-05 NOTE — Assessment & Plan Note (Signed)
Improved and stable on medication

## 2011-03-14 ENCOUNTER — Telehealth: Payer: Self-pay

## 2011-03-20 ENCOUNTER — Ambulatory Visit (INDEPENDENT_AMBULATORY_CARE_PROVIDER_SITE_OTHER): Payer: BC Managed Care – PPO | Admitting: Family Medicine

## 2011-03-20 ENCOUNTER — Telehealth: Payer: Self-pay | Admitting: Family Medicine

## 2011-03-20 ENCOUNTER — Encounter: Payer: Self-pay | Admitting: Family Medicine

## 2011-03-20 VITALS — BP 110/74 | HR 81 | Temp 98.9°F | Resp 16 | Ht 66.0 in | Wt 199.1 lb

## 2011-03-20 DIAGNOSIS — R7301 Impaired fasting glucose: Secondary | ICD-10-CM

## 2011-03-20 DIAGNOSIS — I10 Essential (primary) hypertension: Secondary | ICD-10-CM

## 2011-03-20 DIAGNOSIS — M549 Dorsalgia, unspecified: Secondary | ICD-10-CM | POA: Insufficient documentation

## 2011-03-20 DIAGNOSIS — R102 Pelvic and perineal pain: Secondary | ICD-10-CM | POA: Insufficient documentation

## 2011-03-20 DIAGNOSIS — E669 Obesity, unspecified: Secondary | ICD-10-CM

## 2011-03-20 DIAGNOSIS — F329 Major depressive disorder, single episode, unspecified: Secondary | ICD-10-CM

## 2011-03-20 DIAGNOSIS — R1900 Intra-abdominal and pelvic swelling, mass and lump, unspecified site: Secondary | ICD-10-CM | POA: Insufficient documentation

## 2011-03-20 DIAGNOSIS — N949 Unspecified condition associated with female genital organs and menstrual cycle: Secondary | ICD-10-CM

## 2011-03-20 LAB — BASIC METABOLIC PANEL
CO2: 27 mEq/L (ref 19–32)
Chloride: 104 mEq/L (ref 96–112)
Sodium: 142 mEq/L (ref 135–145)

## 2011-03-20 MED ORDER — VALSARTAN-HYDROCHLOROTHIAZIDE 80-12.5 MG PO TABS: 1.0000 | ORAL_TABLET | Freq: Every day | ORAL | Status: DC

## 2011-03-20 MED ORDER — PHENTERMINE HCL 37.5 MG PO TABS: 37.5000 mg | ORAL_TABLET | Freq: Every day | ORAL | Status: DC

## 2011-03-20 MED ORDER — METHYLPREDNISOLONE ACETATE 80 MG/ML IJ SUSP
80.0000 mg | Freq: Once | INTRAMUSCULAR | Status: AC
  Administered 2011-03-20: 80 mg via INTRAMUSCULAR

## 2011-03-20 MED ORDER — PREDNISONE (PAK) 5 MG PO TABS: 5.0000 mg | ORAL_TABLET | ORAL | Status: DC

## 2011-03-20 MED ORDER — ATENOLOL 25 MG PO TABS: ORAL_TABLET | ORAL | Status: DC

## 2011-03-20 MED ORDER — HYDROCODONE-ACETAMINOPHEN 5-500 MG PO TABS: 1.0000 | ORAL_TABLET | Freq: Every day | ORAL | Status: DC

## 2011-03-20 MED ORDER — AMLODIPINE-ATORVASTATIN 5-40 MG PO TABS: 1.0000 | ORAL_TABLET | Freq: Every day | ORAL | Status: DC

## 2011-03-20 MED ORDER — KETOROLAC TROMETHAMINE 60 MG/2ML IM SOLN
60.0000 mg | Freq: Once | INTRAMUSCULAR | Status: AC
  Administered 2011-03-20: 60 mg via INTRAMUSCULAR

## 2011-03-20 NOTE — Telephone Encounter (Signed)
Has a pain on her left side at her waist toward her back. Couldn't sleep lastnight because of it. Been there for more than a week. She is uncomfortable sitting and lying. Wants to be seen today. Luann to schedule

## 2011-03-20 NOTE — Patient Instructions (Addendum)
F/u in 2 months. Med is sent  In for back/left hip pain, and you will get injections in the office today.  Vital that you continue to lose weight .   pelvic scan to eval pain/mass  New med for weight loss, take hALF daily  hBA1c and chem 7 today

## 2011-03-21 ENCOUNTER — Other Ambulatory Visit: Payer: Self-pay

## 2011-03-21 ENCOUNTER — Ambulatory Visit: Payer: BC Managed Care – PPO | Admitting: Family Medicine

## 2011-03-21 DIAGNOSIS — R7301 Impaired fasting glucose: Secondary | ICD-10-CM

## 2011-03-21 DIAGNOSIS — I10 Essential (primary) hypertension: Secondary | ICD-10-CM

## 2011-03-21 LAB — HEMOGLOBIN A1C: Mean Plasma Glucose: 134 mg/dL — ABNORMAL HIGH (ref <117)

## 2011-03-21 MED ORDER — METFORMIN HCL 500 MG PO TABS: 500.0000 mg | ORAL_TABLET | Freq: Every day | ORAL | Status: DC

## 2011-03-23 NOTE — Telephone Encounter (Signed)
Did not refill any meds. Error

## 2011-03-24 ENCOUNTER — Other Ambulatory Visit: Payer: Self-pay | Admitting: Family Medicine

## 2011-03-24 DIAGNOSIS — Z139 Encounter for screening, unspecified: Secondary | ICD-10-CM

## 2011-03-25 ENCOUNTER — Encounter: Payer: Self-pay | Admitting: Family Medicine

## 2011-03-25 NOTE — Assessment & Plan Note (Signed)
Controlled, no change in medication  

## 2011-03-25 NOTE — Progress Notes (Signed)
  Subjective:    Patient ID: Alexis Hurst, female    DOB: [birth year], 60 y.o.   MRN: [id number]  HPI 2 week h/o left flank pain radiating to left upper and lower quadrants, aggravated by movement and keeps pt awake at night, due to no comfortable position. She denies change in bowel movement or constipation. Denies dysuria or frequency or hematuria. Pt also states she has a fullness in her lower abdomen and is concerned about a possible growth. Otherwise has been doing well, is successfully losing weight, she does have impaired glucose tolerance, she wants to resume phentermine. She has started exercising and palns to increase this    Review of Systems Denies recent fever or chills. Denies sinus pressure, nasal congestion, ear pain or sore throat. Denies chest congestion, productive cough or wheezing. Denies chest pains, palpitations, paroxysmal nocturnal dyspnea, orthopnea and leg swelling Denies  nausea, vomiting,diarrhea or constipation.  Denies rectal bleeding or change in bowel movement. Denies dysuria, frequency, hesitancy or incontinence. Denies headaches, seizure, numbness, or tingling. Denies uncontrolled  depression, anxiety or insomnia. Denies skin break down or rash.        Objective:   Physical Exam Patient alert and oriented and in no Cardiopulmonary distress.  HEENT: No facial asymmetry, EOMI, no sinus tenderness, TM's clear, Oropharynx pink and moist.  Neck supple no adenopathy.  Chest: Clear to auscultation bilaterally.  CVS: S1, S2 no murmurs, no S3.  ABD:  left upper and lower quadrant tenderness with  possible mass. Bowel sounds normal. Rectal ; defered   Ext: No edema  NW:[id number] ROM lumbar  Spine,adequate in  shoulders, hips and knees.  Skin: Intact, no ulcerations or rash noted.  Psych: Good eye contact, normal affect. Memory intact not anxious or depressed appearing.  CNS: CN 2-12 intact, power, tone and sensation normal throughout.         Assessment & Plan:

## 2011-03-25 NOTE — Assessment & Plan Note (Signed)
Possible left lower quadrant mass, accompanied by pain, will refer for abdominal and pelvic scan

## 2011-03-25 NOTE — Assessment & Plan Note (Signed)
Symptoms are primarily  Compatible with back pain with radiculopathy, will treat as  such

## 2011-03-27 ENCOUNTER — Telehealth: Payer: Self-pay | Admitting: Family Medicine

## 2011-03-27 NOTE — Telephone Encounter (Signed)
error 

## 2011-03-28 NOTE — Telephone Encounter (Signed)
Authorization #[id number] has been approved

## 2011-03-30 ENCOUNTER — Encounter (HOSPITAL_COMMUNITY): Payer: Self-pay

## 2011-03-30 ENCOUNTER — Other Ambulatory Visit: Payer: Self-pay | Admitting: Family Medicine

## 2011-03-30 ENCOUNTER — Ambulatory Visit (HOSPITAL_COMMUNITY)
Admission: RE | Admit: 2011-03-30 | Discharge: 2011-03-30 | Disposition: A | Discharge status: Home / Self Care | Payer: BC Managed Care – PPO | Source: Ambulatory Visit | Attending: Family Medicine | Admitting: Family Medicine

## 2011-03-30 DIAGNOSIS — R1031 Right lower quadrant pain: Secondary | ICD-10-CM | POA: Insufficient documentation

## 2011-03-30 DIAGNOSIS — K5730 Diverticulosis of large intestine without perforation or abscess without bleeding: Secondary | ICD-10-CM | POA: Insufficient documentation

## 2011-03-30 DIAGNOSIS — R1900 Intra-abdominal and pelvic swelling, mass and lump, unspecified site: Secondary | ICD-10-CM

## 2011-03-30 DIAGNOSIS — R1032 Left lower quadrant pain: Secondary | ICD-10-CM | POA: Insufficient documentation

## 2011-03-30 MED ORDER — IOHEXOL 300 MG/ML  SOLN
100.0000 mL | Freq: Once | INTRAMUSCULAR | Status: AC | PRN
  Administered 2011-03-30: 100 mL via INTRAVENOUS

## 2011-04-21 ENCOUNTER — Other Ambulatory Visit: Payer: Self-pay | Admitting: Family Medicine

## 2011-04-24 ENCOUNTER — Ambulatory Visit (HOSPITAL_COMMUNITY)
Admission: RE | Admit: 2011-04-24 | Discharge: 2011-04-24 | Disposition: A | Discharge status: Home / Self Care | Payer: BC Managed Care – PPO | Source: Ambulatory Visit | Attending: Family Medicine | Admitting: Family Medicine

## 2011-04-24 DIAGNOSIS — Z139 Encounter for screening, unspecified: Secondary | ICD-10-CM

## 2011-04-24 DIAGNOSIS — Z1231 Encounter for screening mammogram for malignant neoplasm of breast: Secondary | ICD-10-CM | POA: Insufficient documentation

## 2011-05-02 ENCOUNTER — Ambulatory Visit: Payer: BC Managed Care – PPO | Admitting: Family Medicine

## 2011-05-18 ENCOUNTER — Other Ambulatory Visit: Payer: Self-pay | Admitting: Family Medicine

## 2011-05-18 LAB — BASIC METABOLIC PANEL
BUN: 15 mg/dL (ref 6–23)
CO2: 26 mEq/L (ref 19–32)
Calcium: 9.7 mg/dL (ref 8.4–10.5)
Creat: 0.89 mg/dL (ref 0.50–1.10)
Glucose, Bld: 89 mg/dL (ref 70–99)

## 2011-05-20 LAB — VITAMIN D 1,25 DIHYDROXY: Vitamin D3 1, 25 (OH)2: 22 pg/mL

## 2011-05-22 ENCOUNTER — Encounter: Payer: Self-pay | Admitting: Family Medicine

## 2011-05-23 ENCOUNTER — Ambulatory Visit (INDEPENDENT_AMBULATORY_CARE_PROVIDER_SITE_OTHER): Payer: BC Managed Care – PPO | Admitting: Family Medicine

## 2011-05-23 VITALS — BP 120/80 | HR 89 | Resp 16 | Ht 64.0 in | Wt 190.4 lb

## 2011-05-23 DIAGNOSIS — I10 Essential (primary) hypertension: Secondary | ICD-10-CM

## 2011-05-23 DIAGNOSIS — R7303 Prediabetes: Secondary | ICD-10-CM

## 2011-05-23 DIAGNOSIS — R7309 Other abnormal glucose: Secondary | ICD-10-CM

## 2011-05-23 DIAGNOSIS — Z139 Encounter for screening, unspecified: Secondary | ICD-10-CM

## 2011-05-23 DIAGNOSIS — R5381 Other malaise: Secondary | ICD-10-CM

## 2011-05-23 DIAGNOSIS — F329 Major depressive disorder, single episode, unspecified: Secondary | ICD-10-CM

## 2011-05-23 DIAGNOSIS — E669 Obesity, unspecified: Secondary | ICD-10-CM

## 2011-05-23 DIAGNOSIS — F3289 Other specified depressive episodes: Secondary | ICD-10-CM

## 2011-05-23 DIAGNOSIS — E785 Hyperlipidemia, unspecified: Secondary | ICD-10-CM

## 2011-05-23 DIAGNOSIS — Z23 Encounter for immunization: Secondary | ICD-10-CM

## 2011-05-23 MED ORDER — PHENTERMINE HCL 37.5 MG PO TABS: 37.5000 mg | ORAL_TABLET | Freq: Every day | ORAL | Status: DC

## 2011-05-23 MED ORDER — ZOLPIDEM TARTRATE ER 12.5 MG PO TBCR: EXTENDED_RELEASE_TABLET | ORAL | Status: DC

## 2011-05-23 MED ORDER — ATENOLOL 25 MG PO TABS: ORAL_TABLET | ORAL | Status: DC

## 2011-05-23 MED ORDER — AMLODIPINE-ATORVASTATIN 5-40 MG PO TABS: 1.0000 | ORAL_TABLET | Freq: Every day | ORAL | Status: DC

## 2011-05-23 NOTE — Patient Instructions (Signed)
CPE 2nd week in January   Congrats on lifestyle change, please  continue daily exercise also, this will certainly improve your health and wellbeing  Fasting lipid, hepatic, tSH and Vit D  June 14, 2011  HBA1C and fasting chem 7, January approx 5 days before visit.  Flu vac today

## 2011-05-28 ENCOUNTER — Encounter: Payer: Self-pay | Admitting: Family Medicine

## 2011-05-28 DIAGNOSIS — R7303 Prediabetes: Secondary | ICD-10-CM | POA: Insufficient documentation

## 2011-05-28 NOTE — Assessment & Plan Note (Signed)
Controlled, no change in medication  

## 2011-05-28 NOTE — Progress Notes (Signed)
  Subjective:    Patient ID: Alexis Hurst, female    DOB: 05/02/1951, 60 y.o.   MRN: [id number]  HPI The PT is here for follow up and re-evaluation of chronic medical conditions, medication management and review of any available recent lab and radiology data.  Preventive health is updated, specifically  Cancer screening and Immunization.   Questions or concerns regarding consultations or procedures which the PT has had in the interim are  addressed. The PT denies any adverse reactions to current medications since the last visit.  There are no new concerns.  There are no specific complaints . She is proud to report good weight loss with behavioral change and assistance with phentermine      Review of Systems See HPI Denies recent fever or chills. Denies sinus pressure, nasal congestion, ear pain or sore throat. Denies chest congestion, productive cough or wheezing. Denies chest pains, palpitations and leg swelling Denies abdominal pain, nausea, vomiting,diarrhea or constipation.   Denies dysuria, frequency, hesitancy or incontinence. Denies joint pain, swelling and limitation in mobility. Denies headaches, seizures, numbness, or tingling. Denies depression, anxiety or insomnia. Denies skin break down or rash.        Objective:   Physical Exam Patient alert and oriented and in no cardiopulmonary distress.  HEENT: No facial asymmetry, EOMI, no sinus tenderness,  oropharynx pink and moist.  Neck supple no adenopathy.  Chest: Clear to auscultation bilaterally.  CVS: S1, S2 no murmurs, no S3.  ABD: Soft non tender. Bowel sounds normal.  Ext: No edema  MS: Adequate ROM spine, shoulders, hips and knees.  Skin: Intact, no ulcerations or rash noted.  Psych: Good eye contact, normal affect. Memory intact not anxious or depressed appearing.  CNS: CN 2-12 intact, power, tone and sensation normal throughout.        Assessment & Plan:

## 2011-05-28 NOTE — Assessment & Plan Note (Signed)
Improved and controlled ion current medication

## 2011-05-28 NOTE — Assessment & Plan Note (Signed)
Improved HBA1C with weight loss and metformin, pt to continue same, as wel las phentermine

## 2011-05-28 NOTE — Assessment & Plan Note (Signed)
Improved. Pt applauded on succesful weight loss through lifestyle change, and encouraged to continue same. Weight loss goal set for the next several months.  

## 2011-07-31 ENCOUNTER — Encounter: Payer: Self-pay | Admitting: Family Medicine

## 2011-08-01 ENCOUNTER — Ambulatory Visit (INDEPENDENT_AMBULATORY_CARE_PROVIDER_SITE_OTHER): Payer: BC Managed Care – PPO | Admitting: Family Medicine

## 2011-08-01 ENCOUNTER — Encounter: Payer: Self-pay | Admitting: Family Medicine

## 2011-08-01 VITALS — BP 112/62 | HR 100 | Temp 99.4°F | Resp 16 | Ht 66.0 in | Wt 187.0 lb

## 2011-08-01 DIAGNOSIS — E669 Obesity, unspecified: Secondary | ICD-10-CM

## 2011-08-01 DIAGNOSIS — R7309 Other abnormal glucose: Secondary | ICD-10-CM

## 2011-08-01 DIAGNOSIS — F329 Major depressive disorder, single episode, unspecified: Secondary | ICD-10-CM

## 2011-08-01 DIAGNOSIS — I10 Essential (primary) hypertension: Secondary | ICD-10-CM

## 2011-08-01 DIAGNOSIS — R7303 Prediabetes: Secondary | ICD-10-CM

## 2011-08-01 DIAGNOSIS — M549 Dorsalgia, unspecified: Secondary | ICD-10-CM

## 2011-08-01 MED ORDER — PREDNISONE (PAK) 5 MG PO TABS: 5.0000 mg | ORAL_TABLET | ORAL | Status: DC

## 2011-08-01 MED ORDER — METHYLPREDNISOLONE ACETATE PF 80 MG/ML IJ SUSP
80.0000 mg | Freq: Once | INTRAMUSCULAR | Status: AC
  Administered 2011-08-01: 80 mg via INTRAMUSCULAR

## 2011-08-01 MED ORDER — IBUPROFEN 800 MG PO TABS: 800.0000 mg | ORAL_TABLET | Freq: Three times a day (TID) | ORAL | Status: DC | PRN

## 2011-08-01 MED ORDER — KETOROLAC TROMETHAMINE 60 MG/2ML IM SOLN
60.0000 mg | Freq: Once | INTRAMUSCULAR | Status: AC
  Administered 2011-08-01: 60 mg via INTRAMUSCULAR

## 2011-08-01 MED ORDER — CYCLOBENZAPRINE HCL 10 MG PO TABS: 10.0000 mg | ORAL_TABLET | Freq: Three times a day (TID) | ORAL | Status: DC | PRN

## 2011-08-01 NOTE — Assessment & Plan Note (Signed)
Pt to continue to follow diet

## 2011-08-01 NOTE — Assessment & Plan Note (Signed)
Controlled, no change in medication  

## 2011-08-01 NOTE — Assessment & Plan Note (Signed)
Improved. Pt applauded on succesful weight loss through lifestyle change, and encouraged to continue same. Weight loss goal set for the next several months.  

## 2011-08-01 NOTE — Assessment & Plan Note (Signed)
Increased flare x 1 week  with uncontrolled symptoms and numbness in right leg , recurrence

## 2011-08-01 NOTE — Progress Notes (Signed)
  Subjective:    Patient ID: Alexis Hurst, female    DOB: [birth year], 60 y.o.   MRN: [id number]  HPI 1 week h/o increased back pain radiaiting down legs, has been lifting her grandson more as his Mom recovers from surgery. Has established arthritis in the back  Pain radiates down  Right thigh, she denies lower extremity weakness or numbness, has tingling, denies incontinence of stool or urine. Has upcoming trip and is requesting med to help her symptoms Doing very well wioth behavioral modification for weight loss and improved health  Review of Systems Chief Complaint  Patient presents with  . Back Pain    x 1 week, going out of town soon /did not bring meds  . Hypertension  . Hyperlipidemia   Denies recent fever or chills. Denies sinus pressure, nasal congestion, ear pain or sore throat. Denies chest congestion, productive cough or wheezing. Denies chest pains, palpitations and leg swelling Denies abdominal pain, nausea, vomiting,diarrhea or constipation.   Denies dysuria, frequency, hesitancy or incontinenDenies joint pain, swelling and limitation in mobility. Denies headaches, seizures, numbness, or tingling. Denies depression, anxiety or insomnia. Denies skin break down or rash.    Objective:   Physical Exam Patient alert and oriented and in no cardiopulmonary distress.  HEENT: No facial asymmetry, EOMI, no sinus tenderness,  oropharynx pink and moist.  Neck supple no adenopathy.  Chest: Clear to auscultation bilaterally.  CVS: S1, S2 no murmurs, no S3.  ABD: Soft non tender. Bowel sounds normal.  Ext: No edema  WU:[id number] ROM spine, adequate in  shoulders, hips and knees.  Skin: Intact, no ulcerations or rash noted.  Psych: Good eye contact, normal affect. Memory intact not anxious or depressed appearing.  CNS: CN 2-12 intact, power, tone and sensation normal throughout.        Assessment & Plan:

## 2011-08-01 NOTE — Patient Instructions (Signed)
F/U as before.  Toradol and depo medrol in the office today, and medication has also been sent to your pharmacy.  Please use only the muscle relaxant at bedtime initially. If the pain/discomfort is uncontrolled with this, ok to take the vicodin also. Both have potentially sedative side effects, so be careful with mixingg.  Though flexeril, muscle relaxant dosed 3 times daily as needed, be careful due to sedative potential, take at bedtime only at first.  Have a safe trip.

## 2011-08-06 NOTE — Assessment & Plan Note (Signed)
Controlled, no change in medication  

## 2011-08-30 ENCOUNTER — Other Ambulatory Visit: Payer: Self-pay | Admitting: Family Medicine

## 2011-09-01 ENCOUNTER — Telehealth: Payer: Self-pay | Admitting: Family Medicine

## 2011-09-01 MED ORDER — ZOLPIDEM TARTRATE ER 12.5 MG PO TBCR: EXTENDED_RELEASE_TABLET | ORAL | Status: DC

## 2011-09-01 NOTE — Telephone Encounter (Signed)
Called in to pharmacist at [facility]

## 2011-09-04 ENCOUNTER — Encounter: Payer: Self-pay | Admitting: Family Medicine

## 2011-09-13 ENCOUNTER — Other Ambulatory Visit: Payer: Self-pay

## 2011-09-13 MED ORDER — METFORMIN HCL 500 MG PO TABS: ORAL_TABLET | ORAL | Status: DC

## 2011-09-14 ENCOUNTER — Other Ambulatory Visit: Payer: Self-pay

## 2011-09-14 MED ORDER — PHENTERMINE HCL 37.5 MG PO CAPS: 37.5000 mg | ORAL_CAPSULE | ORAL | Status: DC

## 2011-09-18 ENCOUNTER — Encounter: Payer: Self-pay | Admitting: Family Medicine

## 2011-09-18 LAB — HEPATIC FUNCTION PANEL
ALT: 20 U/L (ref 0–35)
AST: 22 U/L (ref 0–37)
Bilirubin, Direct: 0.1 mg/dL (ref 0.0–0.3)
Total Protein: 7 g/dL (ref 6.0–8.3)

## 2011-09-18 LAB — BASIC METABOLIC PANEL
CO2: 31 mEq/L (ref 19–32)
Glucose, Bld: 99 mg/dL (ref 70–99)
Potassium: 3.7 mEq/L (ref 3.5–5.3)
Sodium: 140 mEq/L (ref 135–145)

## 2011-09-18 LAB — LIPID PANEL
Cholesterol: 154 mg/dL (ref 0–200)
Total CHOL/HDL Ratio: 2.2 Ratio
Triglycerides: 57 mg/dL (ref <150)

## 2011-09-18 LAB — TSH: TSH: 1.183 u[IU]/mL (ref 0.350–4.500)

## 2011-09-19 ENCOUNTER — Other Ambulatory Visit (HOSPITAL_COMMUNITY)
Admission: RE | Admit: 2011-09-19 | Discharge: 2011-09-19 | Disposition: A | Discharge status: Home / Self Care | Payer: BC Managed Care – PPO | Source: Ambulatory Visit | Attending: Family Medicine | Admitting: Family Medicine

## 2011-09-19 ENCOUNTER — Encounter: Payer: Self-pay | Admitting: Family Medicine

## 2011-09-19 ENCOUNTER — Ambulatory Visit (INDEPENDENT_AMBULATORY_CARE_PROVIDER_SITE_OTHER): Payer: BC Managed Care – PPO | Admitting: Family Medicine

## 2011-09-19 VITALS — BP 112/70 | HR 106 | Resp 16 | Ht 66.0 in | Wt 190.0 lb

## 2011-09-19 DIAGNOSIS — Z1211 Encounter for screening for malignant neoplasm of colon: Secondary | ICD-10-CM

## 2011-09-19 DIAGNOSIS — R7309 Other abnormal glucose: Secondary | ICD-10-CM

## 2011-09-19 DIAGNOSIS — I10 Essential (primary) hypertension: Secondary | ICD-10-CM

## 2011-09-19 DIAGNOSIS — R5383 Other fatigue: Secondary | ICD-10-CM

## 2011-09-19 DIAGNOSIS — R5381 Other malaise: Secondary | ICD-10-CM

## 2011-09-19 DIAGNOSIS — R7303 Prediabetes: Secondary | ICD-10-CM

## 2011-09-19 DIAGNOSIS — F329 Major depressive disorder, single episode, unspecified: Secondary | ICD-10-CM

## 2011-09-19 DIAGNOSIS — F3289 Other specified depressive episodes: Secondary | ICD-10-CM

## 2011-09-19 DIAGNOSIS — Z13220 Encounter for screening for lipoid disorders: Secondary | ICD-10-CM

## 2011-09-19 DIAGNOSIS — Z0000 Encounter for general adult medical examination without abnormal findings: Secondary | ICD-10-CM

## 2011-09-19 DIAGNOSIS — Z01419 Encounter for gynecological examination (general) (routine) without abnormal findings: Secondary | ICD-10-CM | POA: Insufficient documentation

## 2011-09-19 DIAGNOSIS — E669 Obesity, unspecified: Secondary | ICD-10-CM

## 2011-09-19 MED ORDER — ATENOLOL 25 MG PO TABS: ORAL_TABLET | ORAL | Status: DC

## 2011-09-19 MED ORDER — PHENTERMINE HCL 37.5 MG PO TABS: 37.5000 mg | ORAL_TABLET | Freq: Every day | ORAL | Status: DC

## 2011-09-19 MED ORDER — AMLODIPINE-ATORVASTATIN 5-40 MG PO TABS: 1.0000 | ORAL_TABLET | Freq: Every day | ORAL | Status: DC

## 2011-09-19 MED ORDER — VALSARTAN-HYDROCHLOROTHIAZIDE 80-12.5 MG PO TABS: 1.0000 | ORAL_TABLET | Freq: Every day | ORAL | Status: DC

## 2011-09-19 NOTE — Patient Instructions (Addendum)
F/u in 3.5  month   HBA1c , cmp, lipid, egfr in 3.5 month  It is important that you exercise regularly at least 30 minutes 5 times a week. If you develop chest pain, have severe difficulty breathing, or feel very tired, stop exercising immediately and seek medical attention  A healthy diet is rich in fruit, vegetables and whole grains. Poultry fish, nuts and beans are a healthy choice for protein rather then red meat. A low sodium diet and drinking 64 ounces of water daily is generally recommended. Oils and sweet should be limited. Carbohydrates especially for those who are diabetic or overweight, should be limited to 34-45 gram per meal. It is important to eat on a regular schedule, at least 3 times daily. Snacks should be primarily fruits, vegetables or nuts.   Pls make appt with nutritionist  You are referred to a therapist, this will help alot

## 2011-09-23 NOTE — Progress Notes (Signed)
  Subjective:    Patient ID: Alexis Hurst, female    DOB: 07/01/1951, 61 y.o.   MRN: [id number]  HPI The PT is here for annual exam and re-evaluation of chronic medical conditions, medication management and review of any available recent lab and radiology data.  Preventive health is updated, specifically  Cancer screening and Immunization.   Questions or concerns regarding consultations or procedures which the PT has had in the interim are  addressed. The PT denies any adverse reactions to current medications since the last visit.  Today she is very tearful, states that her recent visit to see her incarcerated has put her backward, she is now willing to see a therapist. 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 Denies sinus pressure, nasal congestion, ear pain or sore throat. Denies chest congestion, productive cough or wheezing. Denies chest pains, palpitations and leg swelling Denies abdominal pain, nausea, vomiting,diarrhea or constipation.   Denies dysuria, frequency, hesitancy or incontinence. Denies joint pain, swelling and limitation in mobility. Denies headaches, seizures, numbness, or tingling.  Denies skin break down or rash.     Objective:   Physical Exam  Pleasant well nourished female, alert and oriented x 3, in no cardio-pulmonary distress. Afebrile. HEENT No facial trauma or asymetry. Sinuses non tender.  EOMI, PERTL, fundoscopic exam is normal, no hemorhage or exudate.  External ears normal, tympanic membranes clear. Oropharynx moist, no exudate, good dentition. Neck: supple, no adenopathy,JVD or thyromegaly.No bruits.  Chest: Clear to ascultation bilaterally.No crackles or wheezes. Non tender to palpation  Breast: No asymetry,no masses. No nipple discharge or inversion. No axillary or supraclavicular  adenopathy  Cardiovascular system; Heart sounds normal,  S1 and  S2 ,no S3.  No murmur, or thrill. Apical beat not displaced Peripheral pulses normal.  Abdomen: Soft, non tender, no organomegaly or masses. No bruits. Bowel sounds normal. No guarding, tenderness or rebound.  Rectal:  No mass. Guaiac negative stool.  GU: External genitalia normal. No lesions. Vaginal canal normal.No discharge. Uterus absent, no adnexal masses, no  adnexal tenderness.  Musculoskeletal exam: Full ROM of spine, hips , shoulders and knees. No deformity ,swelling or crepitus noted. No muscle wasting or atrophy.   Neurologic: Cranial nerves 2 to 12 intact. Power, tone ,sensation and reflexes normal throughout. No disturbance in gait. No tremor.  Skin: Intact, no ulceration, erythema , scaling or rash noted. Pigmentation normal throughout  Psych; . Judgement and concentration normal Depresssed and tearful      Assessment & Plan:

## 2011-09-23 NOTE — Assessment & Plan Note (Signed)
Deteriorated. Patient re-educated about  the importance of commitment to a  minimum of 150 minutes of exercise per week. The importance of healthy food choices with portion control discussed. Encouraged to start a food diary, count calories and to consider  joining a support group. Sample diet sheets offered. Goals set by the patient for the next several months.    

## 2011-09-23 NOTE — Assessment & Plan Note (Signed)
Deteriorated, a lot of psychosocial stress, pt now agreeing to see therapist, recent visit to her son incarcerated in [place] has caused decompensation

## 2011-09-23 NOTE — Assessment & Plan Note (Signed)
Controlled, no change in medication  

## 2011-09-23 NOTE — Assessment & Plan Note (Signed)
  Pt counseled re the need to lower carbs and start exercising to lose weight and improve health. The blood sugar average is unchanged and elevated, she is encouraged to attend class

## 2011-09-28 ENCOUNTER — Encounter (HOSPITAL_COMMUNITY): Payer: Self-pay | Admitting: Psychiatry

## 2011-09-28 ENCOUNTER — Ambulatory Visit (INDEPENDENT_AMBULATORY_CARE_PROVIDER_SITE_OTHER): Payer: BC Managed Care – PPO | Admitting: Psychiatry

## 2011-09-28 DIAGNOSIS — F329 Major depressive disorder, single episode, unspecified: Secondary | ICD-10-CM
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
## 2011-10-06 ENCOUNTER — Encounter (HOSPITAL_COMMUNITY): Payer: Self-pay | Admitting: Psychiatry

## 2011-10-06 ENCOUNTER — Ambulatory Visit (INDEPENDENT_AMBULATORY_CARE_PROVIDER_SITE_OTHER): Payer: BC Managed Care – PPO | Admitting: Psychiatry

## 2011-10-06 DIAGNOSIS — F329 Major depressive disorder, single episode, unspecified: Secondary | ICD-10-CM

## 2011-10-06 NOTE — Patient Instructions (Signed)
Discussed orally 
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
## 2011-10-16 ENCOUNTER — Encounter (HOSPITAL_COMMUNITY): Payer: Self-pay | Admitting: Psychiatry

## 2011-10-16 ENCOUNTER — Ambulatory Visit (INDEPENDENT_AMBULATORY_CARE_PROVIDER_SITE_OTHER): Payer: BC Managed Care – PPO | Admitting: Psychiatry

## 2011-10-16 DIAGNOSIS — F3289 Other specified depressive episodes: Secondary | ICD-10-CM

## 2011-10-16 DIAGNOSIS — F329 Major depressive disorder, single episode, unspecified: Secondary | ICD-10-CM

## 2011-10-16 NOTE — Progress Notes (Signed)
Patient:  Alexis Hurst   DOB: [birth year]  MR Number: [id number]  [facility]: [facility]:  [address] [place],  [place], [address]  Start: Monday 10/16/2011 10:40 AM End: Monday 10/16/2011 11:30 AM  Provider/Observer:     Florencia Reasons, MSW, LCSW   Chief Complaint:      Chief Complaint  Patient presents with  . Depression  . Anxiety    Reason For Service:     The patient was referred for services by primary care physician Dr. Syliva Overman due to patient experiencing symptoms of depression. The patient reports her symptoms began when her 70 year old son became involved in an altercation that resulted in her son being incarcerated for 7 years when he was 61 years old. She reports continued issues with her son once he was released. He has 4 children by different mothers but now is incarcerated again for selling drugs. The patient reports stress regarding tryingto help her grandchildren and interacting with one of the mothers who is demanding and ungrateful per patient's report. The mother also tends to keep the children away from patient when she becomes upset with patient per patient's report. She also expresses concern about her 53 year old daughter who needs assistance from patient regarding childcare for her child. 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  Interventions Strategy:  Supportive therapy  Participation Level:   Active  Participation Quality:  Appropriate      Behavioral Observation:  Well Groomed, Alert,   Current Psychosocial Factors: The patient reports no new stressors.  Content of Session:    Processing feelings, exploring coping mechanisms, identifying ways to improve self-care  Current Status:   The patient continues to experience depressed mood, anxiety, excessive worrying, sleep difficulty, and poor concentration.  Patient Progress:   Fair. The patient continues to experience  stress and worry about her grandchildren. However, she has decided to start setting boundaries regarding financial assistance in the relationship with the mother of 2 of her grandchildren.  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 Patient reports having multiple responsibilities including care for her grandchildren as well as assistance to her mother and her older siblings. She reports little time for self and admits little time for self-care. Therapist and patient explored ways to improve self-care regarding nutrition and exercise.  Target Goals:   Improve coping skills  Last Reviewed:   10/06/2011  Goals Addressed Today:    Improve coping skills  Impression/Diagnosis:   The patient reports she has been experiencing mild symptoms of depression and anxiety for several years with symptoms worsening in recent months. Current symptoms include depressed mood, anxiety, excessive worrying, and poor concentration. She reports significant stress related to her son being incarcerated, strained interaction with the mother of 2 of her grandchildren, and concerns about her daughter and her marriage. 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 Diagnoses depressive disorder NOS  Diagnosis:  Axis I:  1. Depressive disorder             Axis II: No diagnosis

## 2011-10-16 NOTE — Patient Instructions (Signed)
Improve self-care regarding eating habits and exercise

## 2011-10-23 ENCOUNTER — Other Ambulatory Visit: Payer: Self-pay | Admitting: Family Medicine

## 2011-10-26 ENCOUNTER — Other Ambulatory Visit: Payer: Self-pay | Admitting: Family Medicine

## 2011-10-30 ENCOUNTER — Encounter (HOSPITAL_COMMUNITY): Payer: Self-pay | Admitting: Psychiatry

## 2011-10-30 ENCOUNTER — Ambulatory Visit (INDEPENDENT_AMBULATORY_CARE_PROVIDER_SITE_OTHER): Payer: BC Managed Care – PPO | Admitting: Psychiatry

## 2011-10-30 DIAGNOSIS — F329 Major depressive disorder, single episode, unspecified: Secondary | ICD-10-CM

## 2011-10-30 NOTE — Progress Notes (Signed)
Patient:  Alexis Hurst   DOB: 1951/02/27  MR Number: [id number]  Location: [facility]:  [address] [place],  [place], [address]  Start: Monday 10/30/2011 10:35 AM End: Monday 10/30/2011 11:30 AM  Provider/Observer:     Florencia Reasons, MSW, LCSW   Chief Complaint:      Chief Complaint  Patient presents with  . Depression  . Anxiety    Reason For Service:     The patient was referred for services by primary care physician Dr. Syliva Overman due to patient experiencing symptoms of depression. The patient reports her symptoms began when her 4 year old son became involved in an altercation that resulted in her son being incarcerated for 7 years when he was 61 years old. She reports continued issues with her son once he was released. He has 4 children by different mothers but now is incarcerated again for selling drugs. The patient reports stress regarding tryingto help her grandchildren and interacting with one of the mothers who is demanding and ungrateful per patient's report. The mother also tends to keep the children away from patient when she becomes upset with patient per patient's report. She also expresses concern about her 93 year old daughter who needs assistance from patient regarding childcare for her child. 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  Interventions Strategy:  Supportive therapy  Participation Level:   Active  Participation Quality:  Appropriate      Behavioral Observation:  Well Groomed, Alert,   Current Psychosocial Factors: The patient reports continued stress related to son's incarceration and choices.  Content of Session:    Processing feelings, developing treatment plan, identifying ways to improve self-care  Current Status:   The patient experiences improved mood but continued anxiety and worry.  Patient Progress:   Fair. The patient reports improvement in mood as she had  a busy but enjoyable weekend. She reports having more time for self and engaging in activities with friends and her sister. Patient reports she is still trying to develop plans to improve self-care regarding nutrition and exercise. Patient also recognizes the need to develop a schedule to help with her transition  and adjustment to retirement. The patient reports feeling better but acknowledges she continues to internalize her feelings regarding her son. Patient admits that she has unresolved grief and loss issues regarding the choices he has made and the consequences. Patient continues to become very tearful when discussing her son.  Target Goals:   Improve coping skills, processing grief and loss issues  Last Reviewed:   10/06/2011  Goals Addressed Today:    Improve coping skills, processing grief and loss issues  Impression/Diagnosis:   The patient reports she has been experiencing mild symptoms of depression and anxiety for several years with symptoms worsening in recent months. Current symptoms include depressed mood, anxiety, excessive worrying, and poor concentration. She reports significant stress related to her son being incarcerated, strained interaction with the mother of 2 of her grandchildren, and concerns about her daughter and her marriage. 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 Diagnoses depressive disorder NOS  Diagnosis:  Axis I:  1. Depressive disorder, not elsewhere classified             Axis II: No diagnosis

## 2011-10-30 NOTE — Patient Instructions (Signed)
Discussed orally 

## 2011-11-13 ENCOUNTER — Ambulatory Visit (INDEPENDENT_AMBULATORY_CARE_PROVIDER_SITE_OTHER): Payer: BC Managed Care – PPO | Admitting: Psychiatry

## 2011-11-13 DIAGNOSIS — F329 Major depressive disorder, single episode, unspecified: Secondary | ICD-10-CM

## 2011-11-15 NOTE — Patient Instructions (Signed)
Discussed orally 

## 2011-11-15 NOTE — Progress Notes (Addendum)
Patient:  Alexis Hurst   DOB: 14-Aug-1951  MR Number: [id number]  Location: [facility]:  [address] [place],  [place], [address]  Start: Monday 11/13/2011 10:35 AM End: Monday 11/13/2011 11:25 AM  Provider/Observer:     Florencia Reasons, MSW, LCSW   Chief Complaint:      Chief Complaint  Patient presents with  . Anxiety  . Depression    Reason For Service:     The patient was referred for services by primary care physician Dr. Syliva Overman due to patient experiencing symptoms of depression. The patient reports her symptoms began when her 7 year old son became involved in an altercation that resulted in her son being incarcerated for 7 years when he was 61 years old. She reports continued issues with her son once he was released. He has 4 children by different mothers but now is incarcerated again for selling drugs. The patient reports stress regarding tryingto help her grandchildren and interacting with one of the mothers who is demanding and ungrateful per patient's report. The mother also tends to keep the children away from patient when she becomes upset with patient per patient's report. She also expresses concern about her 32 year old daughter who needs assistance from patient regarding childcare for her child. 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 Patient is seen today for a follow up appointment.  Interventions Strategy:  Supportive therapy  Participation Level:   Active  Participation Quality:  Appropriate      Behavioral Observation:  Well Groomed, Alert,   Current Psychosocial Factors: The patient reports continued stress related to son's incarceration and choices.  Content of Session:    Processing feelings, identifying ways to improve self-care  Current Status:   The patient experiences improved mood but continued anxiety and worry.  Patient Progress:   Fair. The patient reports  improvement in mood as she  recently attended a basketball tournament. 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 She reports being busy helps as she does not have much time to worry. She is pleased that she has talked with her grandchildren. She reports she has begun to set more boundaries with her grandchildren's mother. Patient continues to experience sadness along with grief and loss issues regarding her son. She and therapist began to identify losses in the relationship. Patient also identifies and verbalizes feelings she experienced as a reaction to comments from others about her son's situation. Patient expresses hurt and disappointment regarding son's choices. She also grieves over what she had anticipated her role as a grandmother would be and what she thought she would be doing at this point in her life. She expresses resentment that she has these worries. She and therapist begin to discuss ways to redefine her role and ways to achieve balance.  Target Goals:   Improve coping skills, processing grief and loss issues  Last Reviewed:   10/06/2011  Goals Addressed Today:    Improve coping skills, processing grief and loss issues  Impression/Diagnosis:   The patient reports she has been experiencing mild symptoms of depression and anxiety for several years with symptoms worsening in recent months. Current symptoms include depressed mood, anxiety, excessive worrying, and poor concentration. She reports significant stress related to her son being incarcerated, strained interaction with the mother of 2 of her grandchildren, and concerns about her daughter and her marriage. 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 Diagnoses depressive disorder NOS  Diagnosis:  Axis I:  1. Depressive disorder, not elsewhere classified             Axis II: No diagnosis

## 2011-11-27 ENCOUNTER — Ambulatory Visit (HOSPITAL_COMMUNITY): Payer: Self-pay | Admitting: Psychiatry

## 2011-11-27 ENCOUNTER — Ambulatory Visit (INDEPENDENT_AMBULATORY_CARE_PROVIDER_SITE_OTHER): Payer: BC Managed Care – PPO | Admitting: Psychiatry

## 2011-11-27 ENCOUNTER — Encounter (HOSPITAL_COMMUNITY): Payer: Self-pay | Admitting: Psychiatry

## 2011-11-27 DIAGNOSIS — F329 Major depressive disorder, single episode, unspecified: Secondary | ICD-10-CM

## 2011-11-27 NOTE — Patient Instructions (Signed)
Discussed orally 

## 2011-11-27 NOTE — Progress Notes (Signed)
Patient:  Alexis Hurst   DOB: [birth year]  MR Number: [id number]  Location: [facility]:  [address] [place],  [place], [address]  Start: Monday 11/27/2011 11:00 AM End: Monday 11/27/2011 11:50 AM  Provider/Observer:     Florencia Reasons, MSW, LCSW   Chief Complaint:      Chief Complaint  Patient presents with  . Depression    Reason For Service:     The patient was referred for services by primary care physician Dr. Syliva Overman due to patient experiencing symptoms of depression. The patient reports her symptoms began when her 27 year old son became involved in an altercation that resulted in her son being incarcerated for 7 years when he was 61 years old. She reports continued issues with her son once he was released. He has 4 children by different mothers but now is incarcerated again for selling drugs. The patient reports stress regarding tryingto help her grandchildren and interacting with one of the mothers who is demanding and ungrateful per patient's report. The mother also tends to keep the children away from patient when she becomes upset with patient per patient's report. She also expresses concern about her 59 year old daughter who needs assistance from patient regarding childcare for her child. 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 Patient is seen today for a follow up appointment.  Interventions Strategy:  Supportive therapy  Participation Level:   Active  Participation Quality:  Appropriate      Behavioral Observation:  Well Groomed, Alert,   Current Psychosocial Factors: The patient reports continued stress related to son's incarceration and choices.  Content of Session:    Processing feelings,  Identifying ways to set and maintain boundaries, identifying ways to improve self-care  Current Status:   The patient experiences improved mood, decreased anxiety, and decreased  worry.  Patient Progress:   Good. The patient reports continued improvement in mood and continued involvement in activity. She reports enjoying keeping 3 of her grandchildren overnight last weekend. Therapist works with patient to identify ways to transition in redefining her role as a grandmother and identify ways to achieve balance.  Therapist and patient also discuss activities in which patient engages in with her grandchildren that are associated with a traditional grandparent role. Patient reports improved ability to set and maintain boundaries with the mothers of her grandchildren.  She also expresses increased acceptance of the limits of her responsibility and expresses less worry.  She acknowledges decreased pressure on herself to compensate for her son's absence. Patient reports recently talking to her son and is pleased that he is interested in improving his skills as a Paediatric nurse. She expresses less worry about his future and is beginning to consider boundaries regarding her involvement regarding his financial responsibilities to his children when he is released. Patient reports trying to have time for self and reports getting a manicure, pedicure, and reading.  Target Goals:   Improve coping skills, processing grief and loss issues  Last Reviewed:   10/06/2011  Goals Addressed Today:    Improve coping skills, processing grief and loss issues  Impression/Diagnosis:   The patient reports she has been experiencing mild symptoms of depression and anxiety for several years with symptoms worsening in recent months. Current symptoms include depressed mood, anxiety, excessive worrying, and poor concentration. She reports significant stress related to her son being incarcerated, strained interaction with the mother of 2 of her grandchildren, and concerns  about her daughter and her marriage. 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 Diagnoses  depressive disorder NOS  Diagnosis:  Axis I:  1. Depressive disorder, not elsewhere classified             Axis II: No diagnosis

## 2011-12-18 ENCOUNTER — Ambulatory Visit (HOSPITAL_COMMUNITY): Payer: Self-pay | Admitting: Psychiatry

## 2011-12-19 ENCOUNTER — Ambulatory Visit (INDEPENDENT_AMBULATORY_CARE_PROVIDER_SITE_OTHER): Payer: BC Managed Care – PPO | Admitting: Psychiatry

## 2011-12-19 DIAGNOSIS — F329 Major depressive disorder, single episode, unspecified: Secondary | ICD-10-CM

## 2011-12-20 NOTE — Patient Instructions (Signed)
Discussed orally 

## 2011-12-20 NOTE — Progress Notes (Signed)
Patient:  Alexis Hurst   DOB: 26-Jul-1951  MR Number: [id number]  Location: [facility]:  [address] [place],  [place], [address]  Start: Tuesday 12/19/2011 11:05 AM End: Tuesday 12/19/2011 11:55 AM  Provider/Observer:     Florencia Reasons, MSW, LCSW   Chief Complaint:      Chief Complaint  Patient presents with  . Depression    Reason For Service:     The patient was referred for services by primary care physician Dr. Syliva Overman due to patient experiencing symptoms of depression. The patient reports her symptoms began when her 61 year old son became involved in an altercation that resulted in her son being incarcerated for 7 years when he was 61 years old. She reports continued issues with her son once he was released. He has 4 children by different mothers but now is incarcerated again for selling drugs. The patient reports stress regarding tryingto help her grandchildren and interacting with one of the mothers who is demanding and ungrateful per patient's report. The mother also tends to keep the children away from patient when she becomes upset with patient per patient's report. She also expresses concern about her 9 year old daughter who needs assistance from patient regarding childcare for her child. 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 Patient is seen today for a follow up appointment.  Interventions Strategy:  Supportive therapy  Participation Level:   Active  Participation Quality:  Appropriate      Behavioral Observation:  Well Groomed, Alert,   Current Psychosocial Factors: The patient reports daughter recently informed her she is expecting her second child in December 2013.  Content of Session:    Processing feelings,  Identifying ways to set and maintain boundaries, identifying ways to improve self-care  Current Status:   The patient continues to experience improved mood, decreased  anxiety, and decreased worry.  Patient Progress:   Good. The patient reports continued improvement in mood and continued involvement in activity. She reports recently coordinating a birthday party for two of her siblings. She reports continued involvement with her grandchildren and being pleased with her improved ability to set boundaries regarding her role.  Patient shares pictures of her grandchildren with therapist She exhibits increased acceptance of her son's situation. She expresses ambivalent feelings regarding her daughter's pregnancy as she has concerns about increased responsibilities for her daughter as patient's son-in-law does not assist in parenting, financial, and daily responsibilities as patient thinks he should per patient's report.  Patient is able to verbalize her feelings rather than internalizing them as she has in the past.  Therapist also works with patient to identify ways to set and maintain boundaries with daughter and son-in-law as well as identify ways to maintain her role in a healthy manner. Therapist and patient agree that patient has made significant progress and patient is pleased with her progress.  Therefore, services will be discontinued at this time.  Therapist encourages patient to continue efforts regarding self-care especially around time for self. Therapist also encourages patient to call this practice should she need services in the future.   Target Goals:   Improve coping skills, processing grief and loss issues  Last Reviewed:   10/06/2011  Goals Addressed Today:    Improve coping skills, processing grief and loss issues  Impression/Diagnosis:   The patient reports she has been experiencing mild symptoms of depression and anxiety for several years with symptoms worsening in  recent months. Current symptoms include depressed mood, anxiety, excessive worrying, and poor concentration. She reports significant stress related to her son being incarcerated, strained  interaction with the mother of 2 of her grandchildren, and concerns about her daughter and her marriage. 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 Diagnoses depressive disorder NOS  Diagnosis:  Axis I:  1. Depressive disorder, not elsewhere classified             Axis II: No diagnosis

## 2011-12-26 ENCOUNTER — Telehealth: Payer: Self-pay

## 2011-12-26 DIAGNOSIS — R5383 Other fatigue: Secondary | ICD-10-CM

## 2011-12-26 NOTE — Telephone Encounter (Signed)
Need to order some labs on the patient

## 2011-12-27 ENCOUNTER — Ambulatory Visit (INDEPENDENT_AMBULATORY_CARE_PROVIDER_SITE_OTHER): Payer: BC Managed Care – PPO | Admitting: Family Medicine

## 2011-12-27 ENCOUNTER — Encounter: Payer: Self-pay | Admitting: Family Medicine

## 2011-12-27 ENCOUNTER — Ambulatory Visit: Payer: Self-pay | Admitting: Family Medicine

## 2011-12-27 VITALS — BP 124/68 | HR 108 | Resp 18 | Ht 66.0 in | Wt 196.0 lb

## 2011-12-27 DIAGNOSIS — M129 Arthropathy, unspecified: Secondary | ICD-10-CM

## 2011-12-27 DIAGNOSIS — M549 Dorsalgia, unspecified: Secondary | ICD-10-CM

## 2011-12-27 DIAGNOSIS — R7309 Other abnormal glucose: Secondary | ICD-10-CM

## 2011-12-27 DIAGNOSIS — E669 Obesity, unspecified: Secondary | ICD-10-CM

## 2011-12-27 DIAGNOSIS — F329 Major depressive disorder, single episode, unspecified: Secondary | ICD-10-CM

## 2011-12-27 DIAGNOSIS — I10 Essential (primary) hypertension: Secondary | ICD-10-CM

## 2011-12-27 DIAGNOSIS — R7303 Prediabetes: Secondary | ICD-10-CM

## 2011-12-27 LAB — COMPLETE METABOLIC PANEL WITH GFR
Albumin: 4.2 g/dL (ref 3.5–5.2)
BUN: 11 mg/dL (ref 6–23)
Calcium: 9.1 mg/dL (ref 8.4–10.5)
Chloride: 101 mEq/L (ref 96–112)
GFR, Est African American: 87 mL/min
GFR, Est Non African American: 76 mL/min
Glucose, Bld: 88 mg/dL (ref 70–99)
Potassium: 3.8 mEq/L (ref 3.5–5.3)

## 2011-12-27 LAB — CBC WITH DIFFERENTIAL/PLATELET
Eosinophils Relative: 6 % — ABNORMAL HIGH (ref 0–5)
HCT: 39.2 % (ref 36.0–46.0)
Lymphocytes Relative: 34 % (ref 12–46)
Lymphs Abs: 1.8 10*3/uL (ref 0.7–4.0)
MCV: 93.3 fL (ref 78.0–100.0)
Monocytes Absolute: 0.6 10*3/uL (ref 0.1–1.0)
Platelets: 277 10*3/uL (ref 150–400)
RBC: 4.2 MIL/uL (ref 3.87–5.11)
WBC: 5.5 10*3/uL (ref 4.0–10.5)

## 2011-12-27 LAB — LIPID PANEL: Cholesterol: 146 mg/dL (ref 0–200)

## 2011-12-27 MED ORDER — IBUPROFEN 800 MG PO TABS: 800.0000 mg | ORAL_TABLET | Freq: Three times a day (TID) | ORAL | Status: AC | PRN

## 2011-12-27 NOTE — Patient Instructions (Addendum)
F/U in 4.5 month  Ibuprofen three times daily for the next 5 days for joint pains  HBA1C in 4 month  I am happy that you are doing much better.

## 2011-12-27 NOTE — Progress Notes (Signed)
  Subjective:    Patient ID: Alexis Hurst, female    DOB: [birth year], 61 y.o.   MRN: [id number]  HPI The PT is here for follow up and re-evaluation of chronic medical conditions, medication management and review of any available recent lab and radiology data.  Preventive health is updated, specifically  Cancer screening and Immunization.   Questions or concerns regarding consultations or procedures which the PT has had in the interim are  Addressed.Reports she benefited from psychotherapy, no longer going The PT denies any adverse reactions to current medications since the last visit.  Concerned about weight gain, has not made the time to follow through on behavioral changes discussed to address this C/o increased and uncontrolled generalized joint pains   Review of Systems See HPI Denies recent fever or chills. Denies sinus pressure, nasal congestion, ear pain or sore throat. Denies chest congestion, productive cough or wheezing. Denies chest pains, palpitations and leg swelling Denies abdominal pain, nausea, vomiting,diarrhea or constipation.   Denies dysuria, frequency, hesitancy or incontinence. . Denies headaches, seizures, numbness, or tingling. Denies uncontrolled  depression, anxiety or insomnia. Denies skin break down or rash.        Objective:   Physical Exam Patient alert and oriented and in no cardiopulmonary distress.  HEENT: No facial asymmetry, EOMI, no sinus tenderness,  oropharynx pink and moist.  Neck supple no adenopathy.  Chest: Clear to auscultation bilaterally.  CVS: S1, S2 no murmurs, no S3.  ABD: Soft non tender. Bowel sounds normal.  Ext: No edema  MS: Adequate ROM spine, shoulders, hips and knees.  Skin: Intact, no ulcerations or rash noted.  Psych: Good eye contact, normal affect. Memory intact not anxious or depressed appearing.  CNS: CN 2-12 intact, power, tone and sensation normal throughout.        Assessment & Plan:

## 2011-12-31 NOTE — Assessment & Plan Note (Signed)
Controlled, no change in medication  

## 2011-12-31 NOTE — Assessment & Plan Note (Signed)
Deteriorated, inc hBa1C, weight gain and no regularl exercise, pt to change this positively

## 2011-12-31 NOTE — Assessment & Plan Note (Signed)
Increased generalized joint pains, pt to use anti inflammatories

## 2011-12-31 NOTE — Assessment & Plan Note (Signed)
Improved with psychotherapy, continue medication

## 2011-12-31 NOTE — Assessment & Plan Note (Signed)
Deteriorated. Patient re-educated about  the importance of commitment to a  minimum of 150 minutes of exercise per week. The importance of healthy food choices with portion control discussed. Encouraged to start a food diary, count calories and to consider  joining a support group. Sample diet sheets offered. Goals set by the patient for the next several months.    

## 2012-01-07 ENCOUNTER — Other Ambulatory Visit: Payer: Self-pay | Admitting: Family Medicine

## 2012-01-19 ENCOUNTER — Ambulatory Visit: Payer: BC Managed Care – PPO | Admitting: Family Medicine

## 2012-03-19 ENCOUNTER — Other Ambulatory Visit: Payer: Self-pay | Admitting: Family Medicine

## 2012-03-19 DIAGNOSIS — Z139 Encounter for screening, unspecified: Secondary | ICD-10-CM

## 2012-03-29 ENCOUNTER — Other Ambulatory Visit: Payer: Self-pay

## 2012-03-29 MED ORDER — IBUPROFEN 800 MG PO TABS: 800.0000 mg | ORAL_TABLET | Freq: Three times a day (TID) | ORAL | Status: DC | PRN

## 2012-04-10 ENCOUNTER — Other Ambulatory Visit: Payer: Self-pay | Admitting: Family Medicine

## 2012-04-15 ENCOUNTER — Ambulatory Visit (INDEPENDENT_AMBULATORY_CARE_PROVIDER_SITE_OTHER): Payer: BC Managed Care – PPO | Admitting: Family Medicine

## 2012-04-15 ENCOUNTER — Encounter: Payer: Self-pay | Admitting: Family Medicine

## 2012-04-15 VITALS — BP 130/72 | HR 98 | Temp 99.3°F | Resp 16 | Ht 66.0 in | Wt 194.0 lb

## 2012-04-15 DIAGNOSIS — R7303 Prediabetes: Secondary | ICD-10-CM

## 2012-04-15 DIAGNOSIS — J0100 Acute maxillary sinusitis, unspecified: Secondary | ICD-10-CM

## 2012-04-15 DIAGNOSIS — R7309 Other abnormal glucose: Secondary | ICD-10-CM

## 2012-04-15 DIAGNOSIS — F329 Major depressive disorder, single episode, unspecified: Secondary | ICD-10-CM

## 2012-04-15 DIAGNOSIS — J029 Acute pharyngitis, unspecified: Secondary | ICD-10-CM | POA: Insufficient documentation

## 2012-04-15 DIAGNOSIS — I10 Essential (primary) hypertension: Secondary | ICD-10-CM

## 2012-04-15 MED ORDER — PENICILLIN V POTASSIUM 500 MG PO TABS
500.0000 mg | ORAL_TABLET | Freq: Three times a day (TID) | ORAL | Status: AC
Start: 2012-04-15 — End: 2012-04-25

## 2012-04-15 MED ORDER — FLUCONAZOLE 150 MG PO TABS: ORAL_TABLET | ORAL | Status: AC

## 2012-04-15 NOTE — Progress Notes (Signed)
  Subjective:    Patient ID: Alexis Hurst, female    DOB: [birth year], 60 y.o.   MRN: [id number]  HPI 5 day h/o facial pressure, left cheek with sore throat, green nasal drainage and chills, no cough , no documented fever. Feels weak, aches all over, denies chest congestion or productive cough   Review of Systems See HPI Denies chest pains, palpitations and leg swelling Denies abdominal pain, nausea, vomiting,diarrhea or constipation.   Denies dysuria, frequency, hesitancy or incontinence. Denies joint pain, swelling and limitation in mobility. Denies headaches, seizures, numbness, or tingling. Denies uncontrolled  depression, anxiety or insomnia. Denies skin break down or rash.        Objective:   Physical Exam  Patient alert and oriented and in no cardiopulmonary distress.  HEENT: No facial asymmetry, EOMI, maxillary  sinus tenderness,  Oropharynx erythematous and moist.  Neck supple no adenopathy.  Chest: Clear to auscultation bilaterally.  CVS: S1, S2 no murmurs, no S3.  ABD: Soft non tender. Bowel sounds normal.  Ext: No edema  MS: Adequate ROM spine, shoulders, hips and knees.  Skin: Intact, no ulcerations or rash noted.  Psych: Good eye contact, normal affect. Memory intact not anxious or depressed appearing.  CNS: CN 2-12 intact, power, tone and sensation normal throughout.       Assessment & Plan:

## 2012-04-15 NOTE — Patient Instructions (Addendum)
F/u as before  You are being treated for strep throat, and right maxillary sinusitis. Take all medication, till done, and you need to be "isolated" for 24 hours once you start treatment.   Rocephin 500mg  iM in office today and med to pharmacy.  Take max of 6 ibuprofen tabs per week for pain, and may alternate this with max of tylenol 325mg  6 per week

## 2012-04-16 ENCOUNTER — Other Ambulatory Visit: Payer: Self-pay

## 2012-04-16 ENCOUNTER — Other Ambulatory Visit: Payer: Self-pay | Admitting: Family Medicine

## 2012-04-16 DIAGNOSIS — J029 Acute pharyngitis, unspecified: Secondary | ICD-10-CM

## 2012-04-16 MED ORDER — CEFTRIAXONE SODIUM 500 MG IJ SOLR
500.0000 mg | Freq: Once | INTRAMUSCULAR | Status: AC
  Administered 2012-04-16: 500 mg via INTRAMUSCULAR

## 2012-04-19 DIAGNOSIS — J0100 Acute maxillary sinusitis, unspecified: Secondary | ICD-10-CM | POA: Insufficient documentation

## 2012-04-19 NOTE — Assessment & Plan Note (Signed)
Acute infection, antibiotic course prescribed 

## 2012-04-19 NOTE — Assessment & Plan Note (Signed)
Controlled, no change in medication  

## 2012-04-19 NOTE — Assessment & Plan Note (Signed)
Patient educated about the importance of limiting  Carbohydrate intake , the need to commit to daily physical activity for a minimum of 30 minutes , and to commit weight loss. The fact that changes in all these areas will reduce or eliminate all together the development of diabetes is stressed.    

## 2012-04-19 NOTE — Assessment & Plan Note (Signed)
Rapid strep positive, rocephin in office and penicillin prescribed Isolation x 24 hours

## 2012-04-19 NOTE — Assessment & Plan Note (Signed)
Controlled, no change in medication DASH diet and commitment to daily physical activity for a minimum of 30 minutes discussed and encouraged, as a part of hypertension management. The importance of attaining a healthy weight is also discussed.  

## 2012-04-22 ENCOUNTER — Other Ambulatory Visit: Payer: Self-pay | Admitting: Family Medicine

## 2012-04-24 ENCOUNTER — Telehealth: Payer: Self-pay | Admitting: Family Medicine

## 2012-04-24 MED ORDER — VALSARTAN-HYDROCHLOROTHIAZIDE 80-12.5 MG PO TABS: ORAL_TABLET | ORAL | Status: DC

## 2012-04-24 NOTE — Telephone Encounter (Signed)
Not sure which med she was referring too so both Bp meds were refilled

## 2012-04-29 ENCOUNTER — Ambulatory Visit (HOSPITAL_COMMUNITY)
Admission: RE | Admit: 2012-04-29 | Discharge: 2012-04-29 | Disposition: A | Discharge status: Home / Self Care | Payer: BC Managed Care – PPO | Source: Ambulatory Visit | Attending: Family Medicine | Admitting: Family Medicine

## 2012-04-29 DIAGNOSIS — Z139 Encounter for screening, unspecified: Secondary | ICD-10-CM

## 2012-04-29 DIAGNOSIS — Z1231 Encounter for screening mammogram for malignant neoplasm of breast: Secondary | ICD-10-CM | POA: Insufficient documentation

## 2012-05-01 ENCOUNTER — Ambulatory Visit: Payer: Self-pay | Admitting: Family Medicine

## 2012-05-15 ENCOUNTER — Other Ambulatory Visit: Payer: Self-pay | Admitting: Family Medicine

## 2012-05-16 ENCOUNTER — Ambulatory Visit (INDEPENDENT_AMBULATORY_CARE_PROVIDER_SITE_OTHER): Payer: BC Managed Care – PPO | Admitting: Family Medicine

## 2012-05-16 ENCOUNTER — Encounter: Payer: Self-pay | Admitting: Family Medicine

## 2012-05-16 VITALS — BP 112/74 | HR 89 | Resp 15 | Ht 66.0 in | Wt 193.1 lb

## 2012-05-16 DIAGNOSIS — R5381 Other malaise: Secondary | ICD-10-CM

## 2012-05-16 DIAGNOSIS — R7309 Other abnormal glucose: Secondary | ICD-10-CM

## 2012-05-16 DIAGNOSIS — E669 Obesity, unspecified: Secondary | ICD-10-CM

## 2012-05-16 DIAGNOSIS — R5383 Other fatigue: Secondary | ICD-10-CM

## 2012-05-16 DIAGNOSIS — M899 Disorder of bone, unspecified: Secondary | ICD-10-CM

## 2012-05-16 DIAGNOSIS — I10 Essential (primary) hypertension: Secondary | ICD-10-CM

## 2012-05-16 DIAGNOSIS — F411 Generalized anxiety disorder: Secondary | ICD-10-CM

## 2012-05-16 DIAGNOSIS — F329 Major depressive disorder, single episode, unspecified: Secondary | ICD-10-CM

## 2012-05-16 DIAGNOSIS — R7303 Prediabetes: Secondary | ICD-10-CM

## 2012-05-16 DIAGNOSIS — Z23 Encounter for immunization: Secondary | ICD-10-CM

## 2012-05-16 DIAGNOSIS — M949 Disorder of cartilage, unspecified: Secondary | ICD-10-CM

## 2012-05-16 LAB — HEMOGLOBIN A1C
Hgb A1c MFr Bld: 6.1 % — ABNORMAL HIGH (ref <5.7)
Mean Plasma Glucose: 128 mg/dL — ABNORMAL HIGH (ref <117)

## 2012-05-16 MED ORDER — BUSPIRONE HCL 7.5 MG PO TABS: 7.5000 mg | ORAL_TABLET | Freq: Three times a day (TID) | ORAL | Status: DC

## 2012-05-16 NOTE — Patient Instructions (Addendum)
cPe in January, middle, please call if you need me before   Flu vaccine today.  New additional medication for chronic worry, also a big part of this treatment is commitment to daily pleasurable activity  Labs are great and so is your blood pressure  Fasting lipid, cmp, TSH, HBA1C, vit D in January

## 2012-05-20 NOTE — Assessment & Plan Note (Signed)
Deteriorated. Patient re-educated about  the importance of commitment to a  minimum of 150 minutes of exercise per week. The importance of healthy food choices with portion control discussed. Encouraged to start a food diary, count calories and to consider  joining a support group. Sample diet sheets offered. Goals set by the patient for the next several months.    

## 2012-05-20 NOTE — Progress Notes (Signed)
  Subjective:    Patient ID: Alexis Hurst, female    DOB: [birth year], 61 y.o.   MRN: [id number]  HPI The PT is here for follow up and re-evaluation of chronic medical conditions, medication management and review of any available recent lab and radiology data.  Preventive health is updated, specifically  Cancer screening and Immunization.   Questions or concerns regarding consultations or procedures which the PT has had in the interim are  addressed. The PT denies any adverse reactions to current medications since the last visit.  There are no new concerns.  There are no specific complaints       Review of Systems See HPI Denies recent fever or chills. Denies sinus pressure, nasal congestion, ear pain or sore throat. Denies chest congestion, productive cough or wheezing. Denies chest pains, palpitations and leg swelling Denies abdominal pain, nausea, vomiting,diarrhea or constipation.   Denies dysuria, frequency, hesitancy or incontinence. Denies joint pain, swelling and limitation in mobility. Denies headaches, seizures, numbness, or tingling. Denies depression, anxiety or insomnia. Denies skin break down or rash.        Objective:   Physical Exam   Patient alert and oriented and in no cardiopulmonary distress.  HEENT: No facial asymmetry, EOMI, no sinus tenderness,  oropharynx pink and moist.  Neck supple no adenopathy.  Chest: Clear to auscultation bilaterally.  CVS: S1, S2 no murmurs, no S3.  ABD: Soft non tender. Bowel sounds normal.  Ext: No edema  MS: Adequate ROM spine, shoulders, hips and knees.  Skin: Intact, no ulcerations or rash noted.  Psych: Good eye contact, normal affect. Memory intact not anxious or depressed appearing.  CNS: CN 2-12 intact, power, tone and sensation normal throughout.      Assessment & Plan:

## 2012-05-20 NOTE — Assessment & Plan Note (Signed)
Uncontrolled, start buspar also pt to start regular physical activity

## 2012-05-20 NOTE — Assessment & Plan Note (Signed)
Controlled, no change in medication  

## 2012-05-20 NOTE — Assessment & Plan Note (Signed)
Controlled, no change in medication DASH diet and commitment to daily physical activity for a minimum of 30 minutes discussed and encouraged, as a part of hypertension management. The importance of attaining a healthy weight is also discussed.  

## 2012-05-20 NOTE — Assessment & Plan Note (Signed)
Improved, pt encouraged to continue low carb diet and start regular exercise

## 2012-06-17 ENCOUNTER — Other Ambulatory Visit: Payer: Self-pay | Admitting: Family Medicine

## 2012-06-19 ENCOUNTER — Telehealth: Payer: Self-pay | Admitting: Family Medicine

## 2012-06-19 ENCOUNTER — Other Ambulatory Visit: Payer: Self-pay | Admitting: Family Medicine

## 2012-06-19 MED ORDER — AZITHROMYCIN 250 MG PO TABS: ORAL_TABLET | ORAL | Status: AC

## 2012-06-19 NOTE — Telephone Encounter (Signed)
i spoke directly with the pt. Her daughter recently tested positive for pertussis, per treating MD at [facility], since she is a close contact in the home , she is being treated with a  Z pack. I advised that the other close contacts should also contact their MD'S for advice and treatment. She asked about the contacts in daycare of her grandson, and I advised it would be better to notify the facility of possible exposure, ut also to get specific recommendations from the pediatrician

## 2012-06-29 ENCOUNTER — Other Ambulatory Visit: Payer: Self-pay | Admitting: Family Medicine

## 2012-07-20 ENCOUNTER — Other Ambulatory Visit: Payer: Self-pay | Admitting: Family Medicine

## 2012-09-11 ENCOUNTER — Encounter: Payer: BC Managed Care – PPO | Admitting: Family Medicine

## 2012-09-20 ENCOUNTER — Encounter: Payer: Self-pay | Admitting: Family Medicine

## 2012-09-20 ENCOUNTER — Other Ambulatory Visit: Payer: Self-pay

## 2012-09-20 ENCOUNTER — Ambulatory Visit (INDEPENDENT_AMBULATORY_CARE_PROVIDER_SITE_OTHER): Payer: BC Managed Care – PPO | Admitting: Family Medicine

## 2012-09-20 VITALS — BP 120/76 | HR 85 | Resp 16 | Ht 66.0 in | Wt 198.0 lb

## 2012-09-20 DIAGNOSIS — F329 Major depressive disorder, single episode, unspecified: Secondary | ICD-10-CM

## 2012-09-20 DIAGNOSIS — R7309 Other abnormal glucose: Secondary | ICD-10-CM

## 2012-09-20 DIAGNOSIS — G4700 Insomnia, unspecified: Secondary | ICD-10-CM

## 2012-09-20 DIAGNOSIS — E785 Hyperlipidemia, unspecified: Secondary | ICD-10-CM

## 2012-09-20 DIAGNOSIS — M129 Arthropathy, unspecified: Secondary | ICD-10-CM

## 2012-09-20 DIAGNOSIS — I10 Essential (primary) hypertension: Secondary | ICD-10-CM

## 2012-09-20 DIAGNOSIS — F3289 Other specified depressive episodes: Secondary | ICD-10-CM

## 2012-09-20 DIAGNOSIS — R7303 Prediabetes: Secondary | ICD-10-CM

## 2012-09-20 DIAGNOSIS — R5381 Other malaise: Secondary | ICD-10-CM

## 2012-09-20 DIAGNOSIS — E669 Obesity, unspecified: Secondary | ICD-10-CM

## 2012-09-20 DIAGNOSIS — J0120 Acute ethmoidal sinusitis, unspecified: Secondary | ICD-10-CM

## 2012-09-20 DIAGNOSIS — R5383 Other fatigue: Secondary | ICD-10-CM

## 2012-09-20 LAB — VITAMIN D 25 HYDROXY (VIT D DEFICIENCY, FRACTURES): Vit D, 25-Hydroxy: 26 ng/mL — ABNORMAL LOW (ref 30–89)

## 2012-09-20 LAB — LIPID PANEL
HDL: 60 mg/dL (ref >39)
Total CHOL/HDL Ratio: 2.3 Ratio
Triglycerides: 77 mg/dL (ref <150)

## 2012-09-20 LAB — COMPREHENSIVE METABOLIC PANEL
Alkaline Phosphatase: 93 U/L (ref 39–117)
BUN: 9 mg/dL (ref 6–23)
CO2: 29 mEq/L (ref 19–32)
Creat: 0.77 mg/dL (ref 0.50–1.10)
Glucose, Bld: 89 mg/dL (ref 70–99)
Sodium: 141 mEq/L (ref 135–145)
Total Bilirubin: 0.3 mg/dL (ref 0.3–1.2)
Total Protein: 7.1 g/dL (ref 6.0–8.3)

## 2012-09-20 LAB — HEMOGLOBIN A1C
Hgb A1c MFr Bld: 6.2 % — ABNORMAL HIGH (ref <5.7)
Mean Plasma Glucose: 131 mg/dL — ABNORMAL HIGH (ref <117)

## 2012-09-20 LAB — TSH: TSH: 0.876 u[IU]/mL (ref 0.350–4.500)

## 2012-09-20 MED ORDER — VALSARTAN-HYDROCHLOROTHIAZIDE 80-12.5 MG PO TABS: ORAL_TABLET | ORAL | Status: DC

## 2012-09-20 MED ORDER — AMLODIPINE-ATORVASTATIN 5-40 MG PO TABS: ORAL_TABLET | ORAL | Status: DC

## 2012-09-20 MED ORDER — ATENOLOL 25 MG PO TABS: ORAL_TABLET | ORAL | Status: DC

## 2012-09-20 MED ORDER — VENLAFAXINE HCL ER 75 MG PO CP24: ORAL_CAPSULE | ORAL | Status: DC

## 2012-09-20 MED ORDER — PHENTERMINE HCL 30 MG PO TBDP: 1.0000 | ORAL_TABLET | Freq: Every day | ORAL | Status: DC

## 2012-09-20 MED ORDER — METFORMIN HCL 500 MG PO TABS: ORAL_TABLET | ORAL | Status: DC

## 2012-09-20 MED ORDER — ZOLPIDEM TARTRATE ER 12.5 MG PO TBCR: EXTENDED_RELEASE_TABLET | ORAL | Status: DC

## 2012-09-20 MED ORDER — AZITHROMYCIN 250 MG PO TABS: ORAL_TABLET | ORAL | Status: AC

## 2012-09-20 MED ORDER — PHENTERMINE HCL 37.5 MG PO TBDP: 1.0000 | ORAL_TABLET | Freq: Every day | ORAL | Status: DC

## 2012-09-20 NOTE — Patient Instructions (Addendum)
F/U  In 4 month  Weight loss goal of 6 to 8 pounds  Start phentermine half daily  Commit to 1500 cal diet, and 60 ounces water daily  It is important that you exercise regularly at least 30 minutes 5 times a week. If you develop chest pain, have severe difficulty breathing, or feel very tired, stop exercising immediately and seek medical attention    No med changes.  Request zolpidem Commit calcium D  One daily , vit D low   CBC, and HBA1C next visit, non visiting  Antibiotic prescribed for sinusitis

## 2012-09-20 NOTE — Progress Notes (Signed)
  Subjective:    Patient ID: Alexis Hurst, female    DOB: Aug 31, 1951, 62 y.o.   MRN: [id number]  HPI The PT is here for follow up and re-evaluation of chronic medical conditions, medication management and review of any available recent lab and radiology data.  Preventive health is updated, specifically  Cancer screening and Immunization.   . The PT denies any adverse reactions to current medications since the last visit.  10 day h/o ethmoidal pressure with green drainage, no fever, , intermittent chills noted Hasjhad increased stress due to illness in her family, has been unable to commit to healthy lifestyle but intends to do so   Review of Systems See HPI   Denies chest congestion, productive cough or wheezing. Denies chest pains, palpitations and leg swelling Denies abdominal pain, nausea, vomiting,diarrhea or constipation.   Denies dysuria, frequency, hesitancy or incontinence. Denies uncontrolled  joint pain, swelling and limitation in mobility. Denies headaches, seizures, numbness, or tingling. Denies uncontrolled  depression, anxiety or insomnia. Denies skin break down or rash.        Objective:   Physical Exam  Patient alert and oriented and in no cardiopulmonary distress.  HEENT: No facial asymmetry, EOMI, ethmoid  sinus tenderness,  oropharynx pink and moist.  Neck supple no adenopathy.  Chest: Clear to auscultation bilaterally.  CVS: S1, S2 no murmurs, no S3.  ABD: Soft non tender. Bowel sounds normal.  Ext: No edema  MS: Adequate ROM spine, shoulders, hips and knees.  Skin: Intact, no ulcerations or rash noted.  Psych: Good eye contact, normal affect. Memory intact not anxious or depressed appearing.  CNS: CN 2-12 intact, power, tone and sensation normal throughout.       Assessment & Plan:

## 2012-09-21 DIAGNOSIS — E785 Hyperlipidemia, unspecified: Secondary | ICD-10-CM | POA: Insufficient documentation

## 2012-09-21 NOTE — Assessment & Plan Note (Signed)
Deteriorated. Patient re-educated about  the importance of commitment to a  minimum of 150 minutes of exercise per week. The importance of healthy food choices with portion control discussed. Encouraged to start a food diary, count calories and to consider  joining a support group. Sample diet sheets offered. Goals set by the patient for the next several months.    

## 2012-09-21 NOTE — Assessment & Plan Note (Signed)
Controlled, no change in medication  

## 2012-09-21 NOTE — Assessment & Plan Note (Signed)
Deteriorated Patient educated about the importance of limiting  Carbohydrate intake , the need to commit to daily physical activity for a minimum of 30 minutes , and to commit weight loss. The fact that changes in all these areas will reduce or eliminate all together the development of diabetes is stressed.    

## 2012-09-21 NOTE — Assessment & Plan Note (Signed)
Controlled, no change in medication Hyperlipidemia:Low fat diet discussed and encouraged.  \ 

## 2012-09-21 NOTE — Assessment & Plan Note (Signed)
Controlled, no change in medication DASH diet and commitment to daily physical activity for a minimum of 30 minutes discussed and encouraged, as a part of hypertension management. The importance of attaining a healthy weight is also discussed.  

## 2012-09-21 NOTE — Assessment & Plan Note (Signed)
Sleep hygiene reviewed, continue zolpidem

## 2012-09-21 NOTE — Assessment & Plan Note (Signed)
Antibiotic prescribed 

## 2012-12-10 ENCOUNTER — Telehealth: Payer: Self-pay | Admitting: Family Medicine

## 2012-12-11 NOTE — Telephone Encounter (Signed)
Med approved for one year.  Pharmacy and patient notified.

## 2012-12-18 ENCOUNTER — Ambulatory Visit (INDEPENDENT_AMBULATORY_CARE_PROVIDER_SITE_OTHER): Payer: BC Managed Care – PPO | Admitting: Family Medicine

## 2012-12-18 ENCOUNTER — Encounter: Payer: Self-pay | Admitting: Family Medicine

## 2012-12-18 ENCOUNTER — Telehealth: Payer: Self-pay | Admitting: Family Medicine

## 2012-12-18 VITALS — BP 126/74 | HR 96 | Temp 98.9°F | Resp 18 | Ht 66.0 in | Wt 196.1 lb

## 2012-12-18 DIAGNOSIS — R5383 Other fatigue: Secondary | ICD-10-CM

## 2012-12-18 DIAGNOSIS — G4700 Insomnia, unspecified: Secondary | ICD-10-CM

## 2012-12-18 DIAGNOSIS — I10 Essential (primary) hypertension: Secondary | ICD-10-CM

## 2012-12-18 DIAGNOSIS — R5381 Other malaise: Secondary | ICD-10-CM

## 2012-12-18 DIAGNOSIS — R7309 Other abnormal glucose: Secondary | ICD-10-CM

## 2012-12-18 DIAGNOSIS — F329 Major depressive disorder, single episode, unspecified: Secondary | ICD-10-CM

## 2012-12-18 DIAGNOSIS — J309 Allergic rhinitis, unspecified: Secondary | ICD-10-CM

## 2012-12-18 DIAGNOSIS — R7303 Prediabetes: Secondary | ICD-10-CM

## 2012-12-18 DIAGNOSIS — E785 Hyperlipidemia, unspecified: Secondary | ICD-10-CM

## 2012-12-18 DIAGNOSIS — E669 Obesity, unspecified: Secondary | ICD-10-CM

## 2012-12-18 HISTORY — DX: Allergic rhinitis, unspecified: J30.9

## 2012-12-18 MED ORDER — METHYLPREDNISOLONE ACETATE 80 MG/ML IJ SUSP
80.0000 mg | Freq: Once | INTRAMUSCULAR | Status: AC
  Administered 2012-12-18: 80 mg via INTRAMUSCULAR

## 2012-12-18 MED ORDER — FLUTICASONE PROPIONATE 50 MCG/ACT NA SUSP: 1.0000 | Freq: Every day | NASAL | Status: DC

## 2012-12-18 MED ORDER — PREDNISONE (PAK) 5 MG PO TABS: 5.0000 mg | ORAL_TABLET | ORAL | Status: DC

## 2012-12-18 MED ORDER — LORATADINE 10 MG PO TABS: 10.0000 mg | ORAL_TABLET | Freq: Every day | ORAL | Status: DC

## 2012-12-18 NOTE — Assessment & Plan Note (Signed)
Unchanged, continue current treatment

## 2012-12-18 NOTE — Assessment & Plan Note (Signed)
Uncontrolled symptoms x 1 week, anti inflammatories and aggressive allergy treatment Pt to call if infectious symptoms develop

## 2012-12-18 NOTE — Patient Instructions (Addendum)
F/u in mid July, please call if you need me before  Fasting lipid, cmp, hBA1C and CBc in July before visit   You are being treated for uncontrolled allergies. Depo medrol 80mg  im, in offce , followed by flonase, claritin and prednisone dose pack. OK to take oTC sudafed one twicve daily for the next 3 to 5 days to help to reduce drainage from allergies as needed  Please call if symptoms worsen in the next 2 to 4 days, as in fever, chills, green drainage, antibiotics will be sent in

## 2012-12-18 NOTE — Assessment & Plan Note (Signed)
Unchanged. Patient re-educated about  the importance of commitment to a  minimum of 150 minutes of exercise per week. The importance of healthy food choices with portion control discussed. Encouraged to start a food diary, count calories and to consider  joining a support group. Sample diet sheets offered. Goals set by the patient for the next several months.    

## 2012-12-18 NOTE — Assessment & Plan Note (Signed)
Hyperlipidemia:Low fat diet discussed and encouraged.  Updated lab next visit 

## 2012-12-18 NOTE — Telephone Encounter (Signed)
Advise 3pm appt today please

## 2012-12-18 NOTE — Assessment & Plan Note (Signed)
Controlled, no change in medication DASH diet and commitment to daily physical activity for a minimum of 30 minutes discussed and encouraged, as a part of hypertension management. The importance of attaining a healthy weight is also discussed.  

## 2012-12-18 NOTE — Assessment & Plan Note (Signed)
Patient educated about the importance of limiting  Carbohydrate intake , the need to commit to daily physical activity for a minimum of 30 minutes , and to commit weight loss. The fact that changes in all these areas will reduce or eliminate all together the development of diabetes is stressed.   Updated lab for next visit 

## 2012-12-18 NOTE — Progress Notes (Signed)
  Subjective:    Patient ID: Alexis Hurst, female    DOB: 02-24-1951, 62 y.o.   MRN: [id number]  HPI 3 day h/o head congestion, excessive clear drainage,no fever or chills, coughing excessively, sputum is clear. Itchy eyes , uncontrolled allergies. Working excessively caring for grand children and preparing for her Mom's 90th birthday   Review of Systems See HPI Denies recent fever or chills.Feels tired due to excessive cough, worse at night with post nasal drainage  Denies chest pains, palpitations and leg swelling Denies abdominal pain, nausea, vomiting,diarrhea or constipation.   Denies dysuria, frequency, hesitancy or incontinence. Denies joint pain, swelling and limitation in mobility. Denies headaches, seizures, numbness, or tingling. Denies uncontrolled  depression, anxiety or insomnia. Denies skin break down or rash.        Objective:   Physical Exam Patient alert and oriented and in no cardiopulmonary distress.  HEENT: No facial asymmetry, EOMI, mild maxillary  sinus tenderness,  oropharynx pink and moist.  Neck supple no adenopathy.Mild conjunctival erythema bilaterally,nasal mucosa erythematous and edematous  Chest: Clear to auscultation bilaterally.  CVS: S1, S2 no murmurs, no S3.  ABD: Soft non tender. Bowel sounds normal.  Ext: No edema  MS: Adequate ROM spine, shoulders, hips and knees.  Skin: Intact, no ulcerations or rash noted.  Psych: Good eye contact, normal affect. Memory intact not anxious or depressed appearing.  CNS: CN 2-12 intact, power, tone and sensation normal throughout.        Assessment & Plan:

## 2012-12-18 NOTE — Telephone Encounter (Signed)
Congestion, yellow green drainage, cough. Wants to be seen today or meds called in. Please advise.

## 2012-12-18 NOTE — Assessment & Plan Note (Signed)
Controlled, no change in medication  

## 2012-12-18 NOTE — Telephone Encounter (Signed)
Patient in for ov  

## 2013-01-07 ENCOUNTER — Encounter: Payer: Self-pay | Admitting: Family Medicine

## 2013-01-07 ENCOUNTER — Ambulatory Visit (INDEPENDENT_AMBULATORY_CARE_PROVIDER_SITE_OTHER): Payer: BC Managed Care – PPO | Admitting: Family Medicine

## 2013-01-07 VITALS — BP 118/78 | HR 100 | Resp 15 | Ht 66.0 in | Wt 196.0 lb

## 2013-01-07 DIAGNOSIS — E669 Obesity, unspecified: Secondary | ICD-10-CM

## 2013-01-07 DIAGNOSIS — M25559 Pain in unspecified hip: Secondary | ICD-10-CM

## 2013-01-07 DIAGNOSIS — I10 Essential (primary) hypertension: Secondary | ICD-10-CM

## 2013-01-07 DIAGNOSIS — M25551 Pain in right hip: Secondary | ICD-10-CM | POA: Insufficient documentation

## 2013-01-07 MED ORDER — IBUPROFEN 800 MG PO TABS: 800.0000 mg | ORAL_TABLET | Freq: Three times a day (TID) | ORAL | Status: DC | PRN

## 2013-01-07 MED ORDER — KETOROLAC TROMETHAMINE 60 MG/2ML IM SOLN
60.0000 mg | Freq: Once | INTRAMUSCULAR | Status: AC
  Administered 2013-01-07: 60 mg via INTRAMUSCULAR

## 2013-01-07 NOTE — Patient Instructions (Addendum)
F/u as before  Toradol 60mg  Im in office today for right hip pain. Then starting tomorrow, take ibuprofen 3 times daily for three days then 1 daily as needed.Ok to take tylenol one daily as well when you take the one ibuprofen tablet  I believe you have arthritis in the right hip.  You are referred to Dr Romeo Apple for further evaluation and management, his office will call you

## 2013-01-07 NOTE — Progress Notes (Signed)
  Subjective:    Patient ID: Alexis Hurst, female    DOB: May 07, 1951, 62 y.o.   MRN: [id number]  HPI Pt in with 2 week h/o increased and progressive hip pain, limiting mobility and challenging safe ambulation. She has ahd the problem for approx 10 years. No redent trauma. Pain radiates down thigh   Review of Systems See HPI Denies recent fever or chills. Denies sinus pressure, nasal congestion, ear pain or sore throat. Denies chest congestion, productive cough or wheezing. Denies chest pains, palpitations and leg swelling  Denies headaches, seizures, numbness, or tingling. Denies uncontrolled  depression, anxiety or insomnia.       Objective:   Physical Exam  Patient alert and oriented and in no cardiopulmonary distress.pt in pain  HEENT: No facial asymmetry, EOMI, no sinus tenderness,  oropharynx pink and moist.  Neck supple no adenopathy.  Chest: Clear to auscultation bilaterally.  CVS: S1, S2 no murmurs, no S3.  ABD: Soft non tender. Bowel sounds normal.  Ext: No edema  MS: decreased  ROM spine,  hips and knees.  Skin: Intact, no ulcerations or rash noted.  Psych: Good eye contact, normal affect. Memory intact not anxious or depressed appearing.  CNS: CN 2-12 intact, power, tone and sensation normal throughout.       Assessment & Plan:

## 2013-01-08 ENCOUNTER — Ambulatory Visit: Payer: Self-pay | Admitting: Family Medicine

## 2013-01-17 ENCOUNTER — Ambulatory Visit: Payer: Self-pay | Admitting: Family Medicine

## 2013-01-17 ENCOUNTER — Other Ambulatory Visit: Payer: Self-pay | Admitting: Family Medicine

## 2013-01-21 ENCOUNTER — Ambulatory Visit (INDEPENDENT_AMBULATORY_CARE_PROVIDER_SITE_OTHER): Payer: BC Managed Care – PPO

## 2013-01-21 ENCOUNTER — Ambulatory Visit (INDEPENDENT_AMBULATORY_CARE_PROVIDER_SITE_OTHER): Payer: BC Managed Care – PPO | Admitting: Orthopedic Surgery

## 2013-01-21 ENCOUNTER — Encounter: Payer: Self-pay | Admitting: Orthopedic Surgery

## 2013-01-21 VITALS — BP 114/66 | Ht 64.0 in | Wt 193.0 lb

## 2013-01-21 DIAGNOSIS — M25551 Pain in right hip: Secondary | ICD-10-CM

## 2013-01-21 DIAGNOSIS — M5137 Other intervertebral disc degeneration, lumbosacral region: Secondary | ICD-10-CM

## 2013-01-21 DIAGNOSIS — M25559 Pain in unspecified hip: Secondary | ICD-10-CM | POA: Insufficient documentation

## 2013-01-21 DIAGNOSIS — M549 Dorsalgia, unspecified: Secondary | ICD-10-CM

## 2013-01-21 DIAGNOSIS — M51379 Other intervertebral disc degeneration, lumbosacral region without mention of lumbar back pain or lower extremity pain: Secondary | ICD-10-CM | POA: Insufficient documentation

## 2013-01-21 NOTE — Progress Notes (Signed)
Patient ID: Alexis Hurst, female   DOB: 1951-01-13, 62 y.o.   MRN: [id number] Chief Complaint  Patient presents with  . Hip Pain    Right hip pain    History this patient is referred to [place] by Dr. Syliva Overman with right hip pain which started 3 weeks ago but is relieved by ibuprofen. Pain came on gradually no history of trauma she reported a dull throbbing 7/10 constant pain over the right lumbar spine lower segment radiating into the right hip no numbness tingling catching locking or giving way  She does report some palpitations and heart snoring itching in the skin anxiety and seasonal allergies otherwise all systems reviewed and were negative  Medical history as recorded  The past, family history and social history have been reviewed and are recorded above  BP 114/66  Ht 5\' 4"  (1.626 m)  Wt 193 lb (87.544 kg)  BMI 33.11 kg/m2 General appearance is normal, the patient is alert and oriented x3 with normal mood and affect. Mesomorphic body habitus  Ambulation is normal  Lumbar spine is tender on the right on the midline on the left overall range of motion is normal  Both hips are stable lower extremity motor exam is normal skin both lower cavity is normal good pulses in both lower extremities sensory exam normal including equal reflexes and negative straight leg raises bilaterally  X-rays show degenerative disc disease in the hip slight scoliosis in the spine reactive  Normal bilateral hip x-rays   Encounter Diagnoses  Name Primary?  . Hip pain, right Yes  . Back pain   . DDD (degenerative disc disease), lumbosacral     Recommend lumbar stabilization program follow up as needed

## 2013-01-21 NOTE — Patient Instructions (Addendum)
Referral to [facility] PT  Degenerative Disk Disease Degenerative disk disease is a condition caused by the changes that occur in the cushions of the backbone (spinal disks) as you grow older. Spinal disks are soft and compressible disks located between the bones of the spine (vertebrae). They act like shock absorbers. Degenerative disk disease can affect the whole spine. However, the neck and lower back are most commonly affected. Many changes can occur in the spinal disks with aging, such as:  The spinal disks may dry and shrink.  Small tears may occur in the tough, outer covering of the disk (annulus).  The disk space may become smaller due to loss of water.  Abnormal growths in the bone (spurs) may occur. This can put pressure on the nerve roots exiting the spinal canal, causing pain.  The spinal canal may become narrowed. CAUSES  Degenerative disk disease is a condition caused by the changes that occur in the spinal disks with aging. The exact cause is not known, but there is a genetic basis for many patients. Degenerative changes can occur due to loss of fluid in the disk. This makes the disk thinner and reduces the space between the backbones. Small cracks can develop in the outer layer of the disk. This can lead to the breakdown of the disk. You are more likely to get degenerative disk disease if you are overweight. Smoking cigarettes and doing heavy work such as weightlifting can also increase your risk of this condition. Degenerative changes can start after a sudden injury. Growth of bone spurs can compress the nerve roots and cause pain.  SYMPTOMS  The symptoms vary from person to person. Some people may have no pain, while others have severe pain. The pain may be so severe that it can limit your activities. The location of the pain depends on the part of your backbone that is affected. You will have neck or arm pain if a disk in the neck area is affected. You will have pain in your back,  buttocks, or legs if a disk in the lower back is affected. The pain becomes worse while bending, reaching up, or with twisting movements. The pain may start gradually and then get worse as time passes. It may also start after a major or minor injury. You may feel numbness or tingling in the arms or legs.  DIAGNOSIS  Your caregiver will ask you about your symptoms and about activities or habits that may cause the pain. He or she may also ask about any injuries, diseases, or treatments you have had earlier. Your caregiver will examine you to check for the range of movement that is possible in the affected area, to check for strength in your extremities, and to check for sensation in the areas of the arms and legs supplied by different nerve roots. An X-ray of the spine may be taken. Your caregiver may suggest other imaging tests, such as magnetic resonance imaging (MRI), if needed.  TREATMENT  Treatment includes rest, modifying your activities, and applying ice and heat. Your caregiver may prescribe medicines to reduce your pain and may ask you to do some exercises to strengthen your back. In some cases, you may need surgery. You and your caregiver will decide on the treatment that is best for you. HOME CARE INSTRUCTIONS   Follow proper lifting and walking techniques as advised by your caregiver.  Maintain good posture.  Exercise regularly as advised.  Perform relaxation exercises.  Change your sitting, standing, and sleeping  habits as advised. Change positions frequently.  Lose weight as advised.  Stop smoking if you smoke.  Wear supportive footwear. SEEK MEDICAL CARE IF:  Your pain does not go away within 1 to 4 weeks. SEEK IMMEDIATE MEDICAL CARE IF:   Your pain is severe.  You notice weakness in your arms, hands, or legs.  You begin to lose control of your bladder or bowel movements. MAKE SURE YOU:   Understand these instructions.  Will watch your condition.  Will get help right  away if you are not doing well or get worse. Document Released: 06/18/2007 Document Revised: 11/13/2011 Document Reviewed: 06/18/2007 [facility] Patient Information 2013 [place], [place].

## 2013-02-10 ENCOUNTER — Ambulatory Visit (HOSPITAL_COMMUNITY)
Admission: RE | Admit: 2013-02-10 | Discharge: 2013-02-10 | Disposition: A | Discharge status: Home / Self Care | Payer: BC Managed Care – PPO | Source: Ambulatory Visit | Attending: Orthopedic Surgery | Admitting: Orthopedic Surgery

## 2013-02-10 DIAGNOSIS — I10 Essential (primary) hypertension: Secondary | ICD-10-CM | POA: Insufficient documentation

## 2013-02-10 DIAGNOSIS — M545 Low back pain, unspecified: Secondary | ICD-10-CM | POA: Insufficient documentation

## 2013-02-10 DIAGNOSIS — M25559 Pain in unspecified hip: Secondary | ICD-10-CM | POA: Insufficient documentation

## 2013-02-10 DIAGNOSIS — IMO0001 Reserved for inherently not codable concepts without codable children: Secondary | ICD-10-CM | POA: Insufficient documentation

## 2013-02-10 DIAGNOSIS — E119 Type 2 diabetes mellitus without complications: Secondary | ICD-10-CM | POA: Insufficient documentation

## 2013-02-10 NOTE — Assessment & Plan Note (Signed)
Controlled, no change in medication  

## 2013-02-10 NOTE — Evaluation (Signed)
Physical Therapy Evaluation  Patient Details  Name: Alexis Hurst MRN: [id number] Date of Birth: 1951/03/10  Today's Date: 02/10/2013 Time: 0930-1015 PT Time Calculation (min): 45 min Charges Evaluation: [phone] TE: [id number]             Visit#: 1 of 1  Re-eval:   Assessment Diagnosis: Low Back Pain Next MD Visit: Dr. Romeo Apple  Authorization:      Authorization Time Period:    Authorization Visit#:   of     Past Medical History:  Past Medical History  Diagnosis Date  . Chest discomfort     negative stress echo   . Raynauds phenomenon     possible   . Depression   . Fatigue   . Dizziness     mild Orthostatic  . Paresthesias     Upper extremity /episode sensation of coldness and numbness   . Mitral valve prolapse   . Anxiety   . Insomnia   . Obesity   . Hyperlipidemia   . Hypertension   . Diabetes mellitus    Past Surgical History:  Past Surgical History  Procedure Laterality Date  . Vesicovaginal fistula closure w/ tah  1989  . Cystectomy l breast, benign    . Abdominal hysterectomy    . Breast surgery      Subjective   Subjective:  Symptoms/Limitations  Symptoms: Pt is referred for a one time HEP for lumbar stabilzations from Dr. Diamantina Providence. Pt initiall with right hip pain which started 3 weeks ago but is relieved by ibuprofen. Pain came on gradually no history of trauma she reported a dull throbbing 7/10 constant pain over the right lumbar spine lower segment radiating into the right hip no numbness tingling catching locking or giving way  Patient Stated Goals: one time HEP  Pain Assessment  Currently in Pain?: Yes  Pain Score: 7  Pain Location: Back  Pain Orientation: Right  Pain Type: Acute pain;Chronic pain  Pain Onset: 1 to 4 weeks ago  Pain Frequency: Intermittent  Pain Relieving Factors: ibuprofen  Effect of Pain on Daily Activities: difficulty with all weightbearing activities.  Sensation/Coordination/Flexibility/Functional  Tests Coordination Gross Motor Movements are Fluid and Coordinated: No Coordination and Movement Description: impaired to transverse abdominus (L>R), impaired to multifiuds (L>R)  Assessment Lumbar Assessment Lumbar Assessment: Within Functional Limits Palpation Palpation: pain and tenderness with moderate fascial restrictions thorughout Rt lumbar errector spinae and gluteal region  Exercise/Treatments Stretches Active Hamstring Stretch: 2 reps;30 seconds Single Knee to Chest Stretch: 30 seconds;2 reps Hip Flexor Stretch: 2 reps;30 seconds Prone on Elbows Stretch: 1 rep;60 seconds Press Ups: 1 rep;60 seconds Piriformis Stretch: 2 reps;30 seconds (figure) Standing Functional Squats: 10 reps Supine Bent Knee Raise: 5 reps (BLE w/ab set) Bridge: 5 reps Prone  Opposite Arm/Leg Raise: Right arm/Left leg;Left arm/Right leg;5 reps  Physical Therapy Assessment and Plan PT Assessment and Plan Clinical Impression Statement: Pt is a 62 year old female referred to PT for 1x visit HEP for lumbar stabalization secondary to Rt LBP.  Pt able to independently demonstrate HEP and is most limited by fascial restrictions and impaired flexibility.  Educated pt role of PT and if she believes exercises are going well, but needs more hands on therapy to re-visit MD for additional visits. At this time will D/C with HEP.  PT Plan: D/C with HEP    Goals Home Exercise Program Pt will Perform Home Exercise Program: Independently PT Goal: Perform Home Exercise Program - Progress: Goal set today  Problem List Patient Active Problem List   Diagnosis Date Noted  . Low back pain 02/10/2013  . DDD (degenerative disc disease), lumbosacral 01/21/2013  . Hip pain 01/21/2013  . Back pain 01/21/2013  . Pain in right hip 01/07/2013  . Allergic sinusitis 12/18/2012  . Hyperlipidemia LDL goal < 100 09/21/2012  . Prediabetes 05/28/2011  . Pelvic pain in female 03/20/2011  . ARTHRITIS 04/11/2010  .  DERMATOPHYTOSIS OF FOOT 06/08/2009  . PALPITATIONS 06/08/2009  . FATIGUE 02/26/2009  . OBESITY 02/04/2008  . ANXIETY 02/04/2008  . DEPRESSION 02/04/2008  . HYPERTENSION 02/04/2008  . INSOMNIA 02/04/2008    PT Plan of Care PT Home Exercise Plan: see scanned report PT Patient Instructions: role of PT, importance of HEP, answered questions about diagnosis.  Consulted and Agree with Plan of Care: Patient  GP    Annett Fabian, MPT, ATC 02/10/2013, 10:19 AM  Physician Documentation Your signature is required to indicate approval of the treatment plan as stated above.  Please sign and either send electronically or make a copy of this report for your files and return this physician signed original.   Please mark one 1.__approve of plan  2. ___approve of plan with the following conditions.   ______________________________                                                          _____________________ Physician Signature                                                                                                             Date

## 2013-02-10 NOTE — Assessment & Plan Note (Signed)
Deteriorated. Patient re-educated about  the importance of commitment to a  minimum of 150 minutes of exercise per week. The importance of healthy food choices with portion control discussed. Encouraged to start a food diary, count calories and to consider  joining a support group. Sample diet sheets offered. Goals set by the patient for the next several months.    

## 2013-02-10 NOTE — Assessment & Plan Note (Signed)
Increased and uncontrolled , refer orhto for further eval

## 2013-03-18 ENCOUNTER — Other Ambulatory Visit: Payer: Self-pay | Admitting: Family Medicine

## 2013-03-19 ENCOUNTER — Ambulatory Visit (INDEPENDENT_AMBULATORY_CARE_PROVIDER_SITE_OTHER): Payer: BC Managed Care – PPO | Admitting: Family Medicine

## 2013-03-19 ENCOUNTER — Encounter: Payer: Self-pay | Admitting: Family Medicine

## 2013-03-19 VITALS — BP 118/70 | HR 99 | Resp 16 | Ht 66.0 in | Wt 200.0 lb

## 2013-03-19 DIAGNOSIS — F411 Generalized anxiety disorder: Secondary | ICD-10-CM

## 2013-03-19 DIAGNOSIS — R7309 Other abnormal glucose: Secondary | ICD-10-CM

## 2013-03-19 DIAGNOSIS — E669 Obesity, unspecified: Secondary | ICD-10-CM

## 2013-03-19 DIAGNOSIS — F329 Major depressive disorder, single episode, unspecified: Secondary | ICD-10-CM

## 2013-03-19 DIAGNOSIS — I10 Essential (primary) hypertension: Secondary | ICD-10-CM

## 2013-03-19 DIAGNOSIS — Z1211 Encounter for screening for malignant neoplasm of colon: Secondary | ICD-10-CM

## 2013-03-19 DIAGNOSIS — E785 Hyperlipidemia, unspecified: Secondary | ICD-10-CM

## 2013-03-19 DIAGNOSIS — R7303 Prediabetes: Secondary | ICD-10-CM

## 2013-03-19 LAB — CBC WITH DIFFERENTIAL/PLATELET
Basophils Absolute: 0 10*3/uL (ref 0.0–0.1)
Eosinophils Relative: 5 % (ref 0–5)
HCT: 39.1 % (ref 36.0–46.0)
Lymphocytes Relative: 42 % (ref 12–46)
MCV: 89.7 fL (ref 78.0–100.0)
Monocytes Absolute: 0.5 10*3/uL (ref 0.1–1.0)
RDW: 15.1 % (ref 11.5–15.5)
WBC: 5.7 10*3/uL (ref 4.0–10.5)

## 2013-03-19 NOTE — Patient Instructions (Addendum)
Pelvic , breast , and rectal  in early December, call if you need me before  Additional labs CMP  And lipid will be added, you will be called if abnormal   Blood sugar has improved, congrats.  PLEASE try to stop the sweet tea and pepsi, this NEEDS to be done for improved health  PLEASE commit to daily walks   GLAD that you have PEACE   HBA1C and chem 7 in December non fasting  PLS sched you mammogram due end August, we will provide tele contact info  You are referred for screening colonoscopy when due in October, to Dr Darrick Penna

## 2013-03-20 ENCOUNTER — Other Ambulatory Visit: Payer: Self-pay

## 2013-03-20 MED ORDER — VENLAFAXINE HCL ER 75 MG PO CP24: ORAL_CAPSULE | ORAL | Status: DC

## 2013-03-20 MED ORDER — METFORMIN HCL 500 MG PO TABS: ORAL_TABLET | ORAL | Status: DC

## 2013-03-20 MED ORDER — VALSARTAN-HYDROCHLOROTHIAZIDE 80-12.5 MG PO TABS: ORAL_TABLET | ORAL | Status: DC

## 2013-03-20 MED ORDER — ZOLPIDEM TARTRATE ER 12.5 MG PO TBCR: EXTENDED_RELEASE_TABLET | ORAL | Status: DC

## 2013-03-20 MED ORDER — AMLODIPINE-ATORVASTATIN 5-40 MG PO TABS: ORAL_TABLET | ORAL | Status: DC

## 2013-03-20 MED ORDER — ATENOLOL 25 MG PO TABS: ORAL_TABLET | ORAL | Status: DC

## 2013-03-21 LAB — LIPID PANEL

## 2013-03-23 NOTE — Assessment & Plan Note (Signed)
Deteriorated. Patient re-educated about  the importance of commitment to a  minimum of 150 minutes of exercise per week. The importance of healthy food choices with portion control discussed. Encouraged to start a food diary, count calories and to consider  joining a support group. Sample diet sheets offered. Goals set by the patient for the next several months.    

## 2013-03-23 NOTE — Progress Notes (Signed)
  Subjective:    Patient ID: Alexis Hurst, female    DOB: 06/25/1951, 62 y.o.   MRN: [id number]  HPI The PT is here for follow up and re-evaluation of chronic medical conditions, medication management and review of any available recent lab and radiology data.  Preventive health is updated, specifically  Cancer screening and Immunization.   Saw ortho re hip pain established as back issues and has had PT for same with good result She denies  any adverse reactions to current medications since the last visit.  There are no new concerns.  There are no specific complaints, states overall she feels better and has peace, expects new personal challenges in the near future, and is gearing up for these, esp her son being released from jail next year       Review of Systems See HPI Denies recent fever or chills. Denies sinus pressure, nasal congestion, ear pain or sore throat. Denies chest congestion, productive cough or wheezing. Denies chest pains, palpitations and leg swelling Denies abdominal pain, nausea, vomiting,diarrhea or constipation.   Denies dysuria, frequency, hesitancy or incontinence. Chronic unchanged  joint pain, intermittently Denies headaches, seizures, numbness, or tingling. Denies uncontrolled  depression, anxiety or insomnia. Denies skin break down or rash.        Objective:   Physical Exam  Patient alert and oriented and in no cardiopulmonary distress.  HEENT: No facial asymmetry, EOMI, no sinus tenderness,  oropharynx pink and moist.  Neck supple no adenopathy.  Chest: Clear to auscultation bilaterally.  CVS: S1, S2 no murmurs, no S3.  ABD: Soft non tender. Bowel sounds normal.  Ext: No edema  MS: Adequate ROM spine, shoulders, hips and knees.  Skin: Intact, no ulcerations or rash noted.  Psych: Good eye contact, normal affect. Memory intact not anxious or depressed appearing.  CNS: CN 2-12 intact, power, tone and sensation normal  throughout.       Assessment & Plan:

## 2013-03-23 NOTE — Assessment & Plan Note (Signed)
Improved, no med change 

## 2013-03-23 NOTE — Assessment & Plan Note (Signed)
Controlled, no change in medication DASH diet and commitment to daily physical activity for a minimum of 30 minutes discussed and encouraged, as a part of hypertension management. The importance of attaining a healthy weight is also discussed.  

## 2013-03-23 NOTE — Assessment & Plan Note (Signed)
Updated lab needed Controlled when ladt checked, will add lipid to recent lab

## 2013-03-23 NOTE — Assessment & Plan Note (Signed)
Improved and stable currently

## 2013-03-23 NOTE — Assessment & Plan Note (Signed)
Improved, pt applauded on this 

## 2013-03-24 ENCOUNTER — Telehealth: Payer: Self-pay | Admitting: Family Medicine

## 2013-03-24 DIAGNOSIS — E669 Obesity, unspecified: Secondary | ICD-10-CM

## 2013-03-24 DIAGNOSIS — I10 Essential (primary) hypertension: Secondary | ICD-10-CM

## 2013-03-24 DIAGNOSIS — E785 Hyperlipidemia, unspecified: Secondary | ICD-10-CM

## 2013-03-24 DIAGNOSIS — R7303 Prediabetes: Secondary | ICD-10-CM

## 2013-03-24 NOTE — Telephone Encounter (Signed)
Pls contact pt and let her know unfortunately cholesterol and other test (CMP), cannot be added to lab already drawn. Since both were excellent in January and med dose has not changed, should still be good  For lab due for next visit she is to fast, pls MAIL her the NEW lab order, or ask her to collect asap, so that there is no oshe should discard order we gave at visit. New lab is fasting lipid,cmp and HBa1C, due just before next visit

## 2013-04-01 ENCOUNTER — Telehealth: Payer: Self-pay

## 2013-04-01 NOTE — Telephone Encounter (Signed)
I called pt and she said she thinks she had a colonoscopy about 10 years ago by Dr. Katrinka Blazing. Will check with [facility] medical records.

## 2013-04-02 ENCOUNTER — Other Ambulatory Visit: Payer: Self-pay | Admitting: Family Medicine

## 2013-04-02 DIAGNOSIS — Z139 Encounter for screening, unspecified: Secondary | ICD-10-CM

## 2013-04-02 NOTE — Telephone Encounter (Signed)
Received records on last colonoscopy from medical records at [facility]. Last colonoscopy was 06/11/2003 by Dr. Katrinka Blazing and her next was recommended for 5 years and she had FH pos for colon cancer.  OV with LL on 04/30/2013 at 8:30 AM.

## 2013-04-08 ENCOUNTER — Other Ambulatory Visit: Payer: Self-pay | Admitting: Family Medicine

## 2013-04-10 ENCOUNTER — Other Ambulatory Visit: Payer: Self-pay | Admitting: Family Medicine

## 2013-04-11 NOTE — Addendum Note (Signed)
Addended by: Kandis Fantasia B on: 04/11/2013 11:43 AM   Modules accepted: Orders

## 2013-04-11 NOTE — Telephone Encounter (Signed)
Labs ordered and letter composed to notify

## 2013-04-29 ENCOUNTER — Encounter: Payer: Self-pay | Admitting: General Surgery

## 2013-04-30 ENCOUNTER — Encounter: Payer: Self-pay | Admitting: Gastroenterology

## 2013-04-30 ENCOUNTER — Ambulatory Visit (INDEPENDENT_AMBULATORY_CARE_PROVIDER_SITE_OTHER): Payer: BC Managed Care – PPO | Admitting: Gastroenterology

## 2013-04-30 VITALS — BP 106/66 | HR 87 | Temp 98.4°F | Ht 64.0 in | Wt 198.6 lb

## 2013-04-30 DIAGNOSIS — Z800 Family history of malignant neoplasm of digestive organs: Secondary | ICD-10-CM

## 2013-04-30 MED ORDER — PEG-KCL-NACL-NASULF-NA ASC-C 100 G PO SOLR: 1.0000 | ORAL | Status: DC

## 2013-04-30 NOTE — Progress Notes (Signed)
cc'd to pcp 

## 2013-04-30 NOTE — Progress Notes (Signed)
Primary Care Physician:  Syliva Overman, MD  Primary Gastroenterologist:  Jonette Eva, MD   Chief Complaint  Patient presents with  . Colonoscopy    HPI:  Alexis Hurst is a 62 y.o. female here to schedule colonoscopy. Her last procedure was in 2004 by Dr. Katrinka Blazing. Showed multiple diverticula. She has a family history of colon cancer, maternal grandfather and advanced age. She complains of occasional anorectal itching. Stays wet a lot. Lots of sweating. No rectal bleeding. No significant constipation/diarrhea. No abdominal pain, heartburn, dysphagia. No weight loss.   Current Outpatient Prescriptions  Medication Sig Dispense Refill  . amLODipine-atorvastatin (CADUET) 5-40 MG per tablet TAKE ONE TABLET BY MOUTH DAILY  90 tablet  1  . aspirin (ASPIRIN LOW DOSE) 81 MG EC tablet Take 81 mg by mouth daily.        Marland Kitchen atenolol (TENORMIN) 25 MG tablet TAKE 1 TABLET BY MOUTH TWICE DAILY  180 tablet  1  . Calcium Carbonate-Vitamin D (CALCIUM + D) 600-200 MG-UNIT TABS Take by mouth daily.        Marland Kitchen ibuprofen (ADVIL,MOTRIN) 800 MG tablet Take 1 tablet (800 mg total) by mouth every 8 (eight) hours as needed for pain.  30 tablet  0  . loratadine (CLARITIN) 10 MG tablet Take 1 tablet (10 mg total) by mouth daily.  30 tablet  2  . metFORMIN (GLUCOPHAGE) 500 MG tablet TAKE 1 TABLET BY MOUTH DAILY WITH BREAKFAST  90 tablet  1  . Multiple Vitamins-Minerals (WOMENS MULTIVITAMIN PLUS PO) Take by mouth.      . valsartan-hydrochlorothiazide (DIOVAN-HCT) 80-12.5 MG per tablet TAKE 1 TABLET BY MOUTH EVERY DAY  90 tablet  1  . venlafaxine XR (EFFEXOR-XR) 75 MG 24 hr capsule TAKE 1 CAPSULE BY MOUTH EVERY DAY  90 capsule  1  . zolpidem (AMBIEN CR) 12.5 MG CR tablet TAKE 1 TABLET BY MOUTH EVERY NIGHT AT BEDTIME  90 tablet  0   No current facility-administered medications for this visit.    Allergies as of 04/30/2013  . (No Known Allergies)    Past Medical History  Diagnosis Date  . Chest discomfort    negative stress echo   . Raynauds phenomenon     possible   . Depression   . Fatigue   . Dizziness     mild Orthostatic  . Paresthesias     Upper extremity /episode sensation of coldness and numbness   . Mitral valve prolapse   . Anxiety   . Insomnia   . Obesity   . Hyperlipidemia   . Hypertension   . Diabetes mellitus     Past Surgical History  Procedure Laterality Date  . Vesicovaginal fistula closure w/ tah  1989  . Cystectomy l breast, benign    . Abdominal hysterectomy    . Breast surgery    . Colonoscopy  06/11/2003    Smith:multiple medium scatered diverticula in the cecum, a sending colon, transverse colon, descending colon, sigmoid colon.    Family History  Problem Relation Age of Onset  . Heart failure Father   . Cancer Father     lung   . Hypertension Mother   . Hypertension Sister   . Hypertension Sister   . Diabetes Brother   . Colon cancer Maternal Grandfather     age greater than 71  . Crohn's disease Brother     History   Social History  . Marital Status: Married    Spouse Name: N/A  Number of Children: 2  . Years of Education: N/A   Occupational History  . teacher     Social History Main Topics  . Smoking status: Former Smoker    Quit date: 09/28/1991  . Smokeless tobacco: Never Used  . Alcohol Use: No  . Drug Use: No  . Sexual Activity: Not on file   Other Topics Concern  . Not on file   Social History Narrative  . No narrative on file      ROS:  General: Negative for anorexia, weight loss, fever, chills, fatigue, weakness. Eyes: Negative for vision changes.  ENT: Negative for hoarseness, difficulty swallowing , nasal congestion. CV: Negative for chest pain, angina, palpitations, dyspnea on exertion, peripheral edema.  Respiratory: Negative for dyspnea at rest, dyspnea on exertion, cough, sputum, wheezing.  GI: See history of present illness. GU:  Negative for dysuria, hematuria, urinary incontinence, urinary frequency,  nocturnal urination.  MS: Chronic joint pain. No low back pain.  Derm: Negative for rash or itching.  Neuro: Negative for weakness, abnormal sensation, seizure, frequent headaches, memory loss, confusion.  Psych: Negative for anxiety, depression, suicidal ideation, hallucinations.  Endo: Negative for unusual weight change.  Heme: Negative for bruising or bleeding. Allergy: Negative for rash or hives.    Physical Examination:  BP 106/66  Pulse 87  Temp(Src) 98.4 F (36.9 C) (Oral)  Ht 5\' 4"  (1.626 m)  Wt 198 lb 9.6 oz (90.084 kg)  BMI 34.07 kg/m2   General: Well-nourished, well-developed in no acute distress.  Head: Normocephalic, atraumatic.   Eyes: Conjunctiva pink, no icterus. Mouth: Oropharyngeal mucosa moist and pink , no lesions erythema or exudate. Neck: Supple without thyromegaly, masses, or lymphadenopathy.  Lungs: Clear to auscultation bilaterally.  Heart: Regular rate and rhythm, no murmurs rubs or gallops.  Abdomen: Bowel sounds are normal, nontender, nondistended, no hepatosplenomegaly or masses, no abdominal bruits or    hernia , no rebound or guarding.   Rectal: deferred Extremities: No lower extremity edema. No clubbing or deformities.  Neuro: Alert and oriented x 4 , grossly normal neurologically.  Skin: Warm and dry, no rash or jaundice.   Psych: Alert and cooperative, normal mood and affect.  Labs: Lab Results  Component Value Date   CREATININE 0.77 09/19/2012   BUN 9 09/19/2012   NA 141 09/19/2012   K 3.7 09/19/2012   CL 103 09/19/2012   CO2 29 09/19/2012   Lab Results  Component Value Date   ALT 19 09/19/2012   AST 19 09/19/2012   ALKPHOS 93 09/19/2012   BILITOT 0.3 09/19/2012   Lab Results  Component Value Date   WBC 5.7 03/18/2013   HGB 12.9 03/18/2013   HCT 39.1 03/18/2013   MCV 89.7 03/18/2013   PLT 356 03/18/2013     Imaging Studies: No results found.

## 2013-04-30 NOTE — Patient Instructions (Addendum)
1. We have scheduled you for colonoscopy with Dr. Darrick Penna. Please see separate instructions.

## 2013-04-30 NOTE — Assessment & Plan Note (Signed)
Patient is due for ten-year followup colonoscopy. She has a family history of second degree relative having colon cancer at an advanced age. This does not increase her personal risk of colon cancer. She states she remembers part of the procedure before but was not in pain. She wants to be adequately sedated. Will augment conscious sedation with Phenergan 25 mg IV in preop.  I have discussed the risks, alternatives, benefits with regards to but not limited to the risk of reaction to medication, bleeding, infection, perforation and the patient is agreeable to proceed. Written consent to be obtained.

## 2013-05-07 ENCOUNTER — Encounter (HOSPITAL_COMMUNITY): Payer: Self-pay | Admitting: Pharmacy Technician

## 2013-05-12 ENCOUNTER — Ambulatory Visit (HOSPITAL_COMMUNITY)
Admission: RE | Admit: 2013-05-12 | Discharge: 2013-05-12 | Disposition: A | Discharge status: Home / Self Care | Payer: BC Managed Care – PPO | Source: Ambulatory Visit | Attending: Family Medicine | Admitting: Family Medicine

## 2013-05-12 DIAGNOSIS — Z139 Encounter for screening, unspecified: Secondary | ICD-10-CM

## 2013-05-12 DIAGNOSIS — Z1231 Encounter for screening mammogram for malignant neoplasm of breast: Secondary | ICD-10-CM | POA: Insufficient documentation

## 2013-05-17 ENCOUNTER — Other Ambulatory Visit: Payer: Self-pay | Admitting: Family Medicine

## 2013-05-20 ENCOUNTER — Ambulatory Visit (HOSPITAL_COMMUNITY)
Admission: RE | Admit: 2013-05-20 | Discharge: 2013-05-20 | Disposition: A | Discharge status: Home / Self Care | Payer: BC Managed Care – PPO | Source: Ambulatory Visit | Attending: Gastroenterology | Admitting: Gastroenterology

## 2013-05-20 ENCOUNTER — Encounter (HOSPITAL_COMMUNITY): Payer: Self-pay | Admitting: *Deleted

## 2013-05-20 ENCOUNTER — Telehealth: Payer: Self-pay | Admitting: Family Medicine

## 2013-05-20 ENCOUNTER — Encounter (HOSPITAL_COMMUNITY)
Admission: RE | Disposition: A | Discharge status: Home / Self Care | Payer: Self-pay | Source: Ambulatory Visit | Attending: Gastroenterology

## 2013-05-20 DIAGNOSIS — Z1211 Encounter for screening for malignant neoplasm of colon: Secondary | ICD-10-CM | POA: Insufficient documentation

## 2013-05-20 DIAGNOSIS — Z800 Family history of malignant neoplasm of digestive organs: Secondary | ICD-10-CM

## 2013-05-20 DIAGNOSIS — K648 Other hemorrhoids: Secondary | ICD-10-CM

## 2013-05-20 DIAGNOSIS — K5730 Diverticulosis of large intestine without perforation or abscess without bleeding: Secondary | ICD-10-CM | POA: Insufficient documentation

## 2013-05-20 DIAGNOSIS — I10 Essential (primary) hypertension: Secondary | ICD-10-CM | POA: Insufficient documentation

## 2013-05-20 DIAGNOSIS — Z01812 Encounter for preprocedural laboratory examination: Secondary | ICD-10-CM | POA: Insufficient documentation

## 2013-05-20 DIAGNOSIS — E119 Type 2 diabetes mellitus without complications: Secondary | ICD-10-CM | POA: Insufficient documentation

## 2013-05-20 HISTORY — PX: COLONOSCOPY: SHX5424

## 2013-05-20 LAB — GLUCOSE, CAPILLARY: Glucose-Capillary: 94 mg/dL (ref 70–99)

## 2013-05-20 SURGERY — COLONOSCOPY
Anesthesia: Moderate Sedation

## 2013-05-20 MED ORDER — MIDAZOLAM HCL 5 MG/5ML IJ SOLN
INTRAMUSCULAR | Status: DC | PRN
  Administered 2013-05-20 (×2): 2 mg via INTRAVENOUS

## 2013-05-20 MED ORDER — PROMETHAZINE HCL 25 MG/ML IJ SOLN
INTRAMUSCULAR | Status: AC
  Filled 2013-05-20: qty 1

## 2013-05-20 MED ORDER — METFORMIN HCL 500 MG PO TABS: 500.0000 mg | ORAL_TABLET | Freq: Every day | ORAL | Status: DC

## 2013-05-20 MED ORDER — ZOLPIDEM TARTRATE ER 12.5 MG PO TBCR: EXTENDED_RELEASE_TABLET | ORAL | Status: DC

## 2013-05-20 MED ORDER — MEPERIDINE HCL 100 MG/ML IJ SOLN
INTRAMUSCULAR | Status: DC | PRN
  Administered 2013-05-20: 50 mg via INTRAVENOUS
  Administered 2013-05-20: 25 mg via INTRAVENOUS

## 2013-05-20 MED ORDER — SODIUM CHLORIDE 0.9 % IV SOLN
INTRAVENOUS | Status: DC
  Administered 2013-05-20: 08:00:00 via INTRAVENOUS

## 2013-05-20 MED ORDER — STERILE WATER FOR IRRIGATION IR SOLN
Status: DC | PRN
  Administered 2013-05-20: 08:00:00

## 2013-05-20 MED ORDER — PROMETHAZINE HCL 25 MG/ML IJ SOLN
25.0000 mg | Freq: Once | INTRAMUSCULAR | Status: AC
  Administered 2013-05-20: 25 mg via INTRAVENOUS

## 2013-05-20 MED ORDER — SODIUM CHLORIDE 0.9 % IJ SOLN
INTRAMUSCULAR | Status: AC
  Filled 2013-05-20: qty 10

## 2013-05-20 MED ORDER — MEPERIDINE HCL 100 MG/ML IJ SOLN
INTRAMUSCULAR | Status: AC
  Filled 2013-05-20: qty 2

## 2013-05-20 MED ORDER — MIDAZOLAM HCL 5 MG/5ML IJ SOLN
INTRAMUSCULAR | Status: AC
  Filled 2013-05-20: qty 10

## 2013-05-20 NOTE — H&P (Addendum)
Primary Care Physician:  Syliva Overman, MD Primary Gastroenterologist:  Dr. Darrick Penna  Pre-Procedure History & Physical: HPI:  Alexis Hurst is a 62 y.o. female here for COLON CANCER SCREENING. Had GREAT DIFFICULTY USING FLEETS ENEMA.  Past Medical History  Diagnosis Date  . Chest discomfort     negative stress echo   . Raynauds phenomenon     possible   . Depression   . Fatigue   . Dizziness     mild Orthostatic  . Paresthesias     Upper extremity /episode sensation of coldness and numbness   . Mitral valve prolapse   . Anxiety   . Insomnia   . Obesity   . Hyperlipidemia   . Hypertension   . Diabetes mellitus     Past Surgical History  Procedure Laterality Date  . Vesicovaginal fistula closure w/ tah  1989  . Cystectomy l breast, benign    . Abdominal hysterectomy    . Breast surgery    . Colonoscopy  06/11/2003    Smith:multiple medium scatered diverticula in the cecum, a sending colon, transverse colon, descending colon, sigmoid colon.    Prior to Admission medications   Medication Sig Start Date End Date Taking? Authorizing Provider  amLODipine-atorvastatin (CADUET) 5-40 MG per tablet Take 1 tablet by mouth daily.   Yes Historical Provider, MD  aspirin (ASPIRIN LOW DOSE) 81 MG EC tablet Take 81 mg by mouth daily.     Yes Historical Provider, MD  atenolol (TENORMIN) 25 MG tablet Take 25 mg by mouth 2 (two) times daily.   Yes Historical Provider, MD  Calcium Carbonate-Vitamin D (CALCIUM + D) 600-200 MG-UNIT TABS Take 1 tablet by mouth daily.    Yes Historical Provider, MD  ibuprofen (ADVIL,MOTRIN) 800 MG tablet Take 800 mg by mouth every 8 (eight) hours as needed for pain. 01/07/13  Yes Kerri Perches, MD  metFORMIN (GLUCOPHAGE) 500 MG tablet Take 500 mg by mouth daily with breakfast.   Yes Historical Provider, MD  Multiple Vitamin (MULTIVITAMIN WITH MINERALS) TABS tablet Take 1 tablet by mouth daily.   Yes Historical Provider, MD  peg 3350 powder (MOVIPREP) 100  G SOLR Take 1 kit (200 g total) by mouth as directed. 04/30/13  Yes West Bali, MD  valsartan-hydrochlorothiazide (DIOVAN-HCT) 80-12.5 MG per tablet Take 1 tablet by mouth daily.   Yes Historical Provider, MD  zolpidem (AMBIEN CR) 12.5 MG CR tablet TAKE 1 TABLET BY MOUTH ONCE DAILY AT BEDTIME 05/17/13  Yes Kerri Perches, MD  loratadine (CLARITIN) 10 MG tablet Take 10 mg by mouth daily as needed for allergies. 12/18/12 12/18/13  Kerri Perches, MD  venlafaxine XR (EFFEXOR-XR) 75 MG 24 hr capsule Take 75 mg by mouth daily.    Historical Provider, MD    Allergies as of 04/30/2013  . (No Known Allergies)    Family History  Problem Relation Age of Onset  . Heart failure Father   . Cancer Father     lung   . Hypertension Mother   . Hypertension Sister   . Hypertension Sister   . Diabetes Brother   . Colon cancer Maternal Grandfather     age greater than 85  . Crohn's disease Brother     History   Social History  . Marital Status: Married    Spouse Name: N/A    Number of Children: 2  . Years of Education: N/A   Occupational History  . teacher     Social  History Main Topics  . Smoking status: Former Smoker    Quit date: 09/28/1991  . Smokeless tobacco: Never Used  . Alcohol Use: No  . Drug Use: No  . Sexual Activity: Not on file   Other Topics Concern  . Not on file   Social History Narrative  . No narrative on file    Review of Systems: See HPI, otherwise negative ROS   Physical Exam: BP 135/70  Pulse 91  Temp(Src) 98.9 F (37.2 C) (Oral)  Resp 15  Ht 5\' 4"  (1.626 m)  Wt 198 lb (89.812 kg)  BMI 33.97 kg/m2  SpO2 96% General:   Alert,  pleasant and cooperative in NAD Head:  Normocephalic and atraumatic. Neck:  Supple; Lungs:  Clear throughout to auscultation.    Heart:  Regular rate and rhythm. Abdomen:  Soft, nontender and nondistended. Normal bowel sounds, without guarding, and without rebound.   Neurologic:  Alert and  oriented x4;  grossly  normal neurologically.  Impression/Plan:     SCREENING  Plan:  1. TCS TODAY

## 2013-05-20 NOTE — Op Note (Signed)
[facility] [address] [place], [address]   COLONOSCOPY PROCEDURE REPORT  PATIENT: Alexis Hurst, Alexis Hurst  MR#: [id number] BIRTHDATE: [birth year] , 61  yrs. old GENDER: Female ENDOSCOPIST: Jonette Eva, MD REFERRED [id number] Lodema Hong, M.D. PROCEDURE DATE:  05/20/2013 PROCEDURE:   Colonoscopy, screening INDICATIONS:Average risk patient for colon cancer.  HAD DIFFICULTY USING FLEETS ENEMA. MEDICATIONS: Demerol 75 mg IV, Versed 4 mg IV, and PREOP: Promethazine (Phenergan) 25mg  IV  DESCRIPTION OF PROCEDURE:    Physical exam was performed.  Informed consent was obtained from the patient after explaining the benefits, risks, and alternatives to procedure.  The patient was connected to monitor and placed in left lateral position. Continuous oxygen was provided by nasal cannula and IV medicine administered through an indwelling cannula.  After administration of sedation and rectal exam, the patients rectum was intubated and the EC-[id number] ([id number])  colonoscope was advanced under direct visualization to the cecum.  The scope was removed slowly by carefully examining the color, texture, anatomy, and integrity mucosa on the way out.  The patient was recovered in endoscopy and discharged home in satisfactory condition.    COLON FINDINGS: Moderate diverticulosis was noted in the descending colon and sigmoid colon.  , Mild diverticulosis was noted in the transverse colon and ascending colon.  , The colon mucosa was otherwise normal.  , and Small internal hemorrhoids were found.  PREP QUALITY: good.  CECAL W/D TIME: 16 minutes  COMPLICATIONS: None  ENDOSCOPIC IMPRESSION: 1.   Moderate diverticulosis in the descending colon and sigmoid colon 2.   Mild diverticulosis in the transverse colon and ascending colon  3.   Small internal hemorrhoids   RECOMMENDATIONS: CONTINUE YOUR WEIGHT LOSS EFFORTS. Follow a HIGH FIBER/LOW FAT DIET.  AVOID ITEMS THAT CAUSE BLOATING   GAS. USE PREPARATION H FOUR TIMES A DAY FOR 7 DAYS IF YOU HAVE RECTAL BLEEDING.  CALL THE OFFICE IF BLEEDING CONTINUES AFTER 7 DAYS.  Next colonoscopy in 10 years WITH PHENERGAN IN PREOP AND NO FLEETS ENEMA.       _______________________________ Rosalie DoctorJonette Eva, MD 05/20/2013 11:59 AM

## 2013-05-20 NOTE — Telephone Encounter (Signed)
Refills sent

## 2013-05-20 NOTE — Progress Notes (Signed)
REVIEWED.  

## 2013-05-21 ENCOUNTER — Encounter (HOSPITAL_COMMUNITY): Payer: Self-pay | Admitting: Gastroenterology

## 2013-05-23 ENCOUNTER — Telehealth: Payer: Self-pay | Admitting: Family Medicine

## 2013-05-23 NOTE — Telephone Encounter (Signed)
I received the PA form from pharmacy. Will call and do today after lunch

## 2013-05-26 ENCOUNTER — Telehealth: Payer: Self-pay | Admitting: Gastroenterology

## 2013-05-26 ENCOUNTER — Telehealth: Payer: Self-pay | Admitting: Family Medicine

## 2013-05-26 NOTE — Telephone Encounter (Signed)
Pt had TCS done last Tuesday and is now having problems with bloating. Please advise. She has a funeral to go to this afternoon and said that she would call DS when she gets back. [phone]

## 2013-05-26 NOTE — Telephone Encounter (Signed)
Called and pharamcy confirmed the pa went through. They will give patient a call

## 2013-05-27 NOTE — Telephone Encounter (Signed)
PLEASE CALL PT. HER TCS SHOULD NOT CAUSE BLOATING. THE BLOATING IS MOST LIKELY DUE TO WHAT SHE'S EATING. SHE SHOULD  AVOID DAIRY PRODUCTS.SHE CAN PICKUP A HANDOUT. SHE SHOULD ADD A PROBIOTIC DAILY([employer], [employer] OR ALIGN) FOR THE NEXT MO.

## 2013-05-27 NOTE — Telephone Encounter (Signed)
Pt called this AM and said she is having a lot of bloating since the TCS. She had a good BM the day following the procedure. Since it has been very small BM's. She is not eating the best, had a funeral on Sun and Mon. Please advise for the bloating.

## 2013-05-27 NOTE — Telephone Encounter (Signed)
Called and informed pt. She said she does not do dairy, she is allergic to it, She will do the probiotic.

## 2013-06-04 ENCOUNTER — Ambulatory Visit (INDEPENDENT_AMBULATORY_CARE_PROVIDER_SITE_OTHER): Payer: BC Managed Care – PPO

## 2013-06-04 DIAGNOSIS — Z23 Encounter for immunization: Secondary | ICD-10-CM

## 2013-07-25 ENCOUNTER — Ambulatory Visit (INDEPENDENT_AMBULATORY_CARE_PROVIDER_SITE_OTHER): Payer: BC Managed Care – PPO | Admitting: Family Medicine

## 2013-07-25 ENCOUNTER — Encounter: Payer: Self-pay | Admitting: Family Medicine

## 2013-07-25 VITALS — BP 110/72 | HR 100 | Temp 98.7°F | Resp 16 | Wt 199.8 lb

## 2013-07-25 DIAGNOSIS — R5381 Other malaise: Secondary | ICD-10-CM

## 2013-07-25 DIAGNOSIS — R7309 Other abnormal glucose: Secondary | ICD-10-CM

## 2013-07-25 DIAGNOSIS — J209 Acute bronchitis, unspecified: Secondary | ICD-10-CM | POA: Insufficient documentation

## 2013-07-25 DIAGNOSIS — R7301 Impaired fasting glucose: Secondary | ICD-10-CM

## 2013-07-25 DIAGNOSIS — I10 Essential (primary) hypertension: Secondary | ICD-10-CM

## 2013-07-25 DIAGNOSIS — G4700 Insomnia, unspecified: Secondary | ICD-10-CM

## 2013-07-25 DIAGNOSIS — E669 Obesity, unspecified: Secondary | ICD-10-CM

## 2013-07-25 DIAGNOSIS — R7303 Prediabetes: Secondary | ICD-10-CM

## 2013-07-25 DIAGNOSIS — J0100 Acute maxillary sinusitis, unspecified: Secondary | ICD-10-CM

## 2013-07-25 DIAGNOSIS — E785 Hyperlipidemia, unspecified: Secondary | ICD-10-CM

## 2013-07-25 MED ORDER — BENZONATATE 100 MG PO CAPS: 100.0000 mg | ORAL_CAPSULE | Freq: Three times a day (TID) | ORAL | Status: AC | PRN

## 2013-07-25 MED ORDER — PROMETHAZINE-DM 6.25-15 MG/5ML PO SYRP: ORAL_SOLUTION | ORAL | Status: DC

## 2013-07-25 MED ORDER — FLUCONAZOLE 150 MG PO TABS: ORAL_TABLET | ORAL | Status: AC

## 2013-07-25 MED ORDER — MOXIFLOXACIN HCL 400 MG PO TABS: 400.0000 mg | ORAL_TABLET | Freq: Every day | ORAL | Status: AC

## 2013-07-25 MED ORDER — CEFTRIAXONE SODIUM 500 MG IJ SOLR
500.0000 mg | Freq: Once | INTRAMUSCULAR | Status: AC
  Administered 2013-07-25: 500 mg via INTRAMUSCULAR

## 2013-07-25 NOTE — Progress Notes (Signed)
  Subjective:    Patient ID: Alexis Hurst, female    DOB: 22-Dec-1950, 62 y.o.   MRN: [id number]  HPI 1 week h/o progressive;ly worsening head and chest congestion, yellow green nasal drainage, chills, and cough productive of thick green sputum.Alot of sick contact in the home. Has used OTC products with no improvement Has poor sleep and chest pain due to excessive cough   Review of Systems See HPI Denies PND, orhtopnea, palpitations and leg swelling Denies abdominal pain, nausea, vomiting,diarrhea or constipation.   Denies dysuria, frequency, hesitancy or incontinence. Denies joint pain, swelling and limitation in mobility. Denies headaches, seizures, numbness, or tingling. Denies uncontrolled depression, anxiety or insomnia. Denies skin break down or rash.        Objective:   Physical Exam Patient alert and oriented and in no cardiopulmonary distress.  HEENT: No facial asymmetry, EOMI, maxillary  sinus tenderness,  oropharynx pink and moist.  Neck supple anterior cervical  adenopathy.  Chest: decreased though adequate air entry, no wheezes, few crackles  CVS: S1, S2 no murmurs, no S3.  ABD: Soft non tender. Bowel sounds normal.  Ext: No edema  MS: Adequate ROM spine, shoulders, hips and knees.  Skin: Intact, no ulcerations or rash noted.  Psych: Good eye contact, normal affect. Memory intact not anxious or depressed appearing.  CNS: CN 2-12 intact, power, tone and sensation normal throughout.        Assessment & Plan:

## 2013-07-25 NOTE — Patient Instructions (Addendum)
F/u as before.  You are being treated for acute sinusitis and bronchitis.  Rocephin 500mg  IM. In the office today, and 10 day course of antibiotics and decongestants, and cough suppressant and medication for yeast infection are sent in.  Please get a lot of rest and drink fluids, tylenol is good for generalised aches, pains, and fever

## 2013-07-26 DIAGNOSIS — J0100 Acute maxillary sinusitis, unspecified: Secondary | ICD-10-CM | POA: Insufficient documentation

## 2013-07-26 NOTE — Assessment & Plan Note (Signed)
Rocephin in office followed by decongestants and antibioitcs as well as cough suppressant

## 2013-07-26 NOTE — Assessment & Plan Note (Signed)
Sleep hygiene reviewed. Continue med as before

## 2013-07-26 NOTE — Assessment & Plan Note (Signed)
DASH diet and commitment to daily physical activity for a minimum of 30 minutes discussed and encouraged, as a part of hypertension management. The importance of attaining a healthy weight is also discussed. Updated lab for next visit

## 2013-07-26 NOTE — Assessment & Plan Note (Signed)
Controlled, no change in medication  

## 2013-07-26 NOTE — Assessment & Plan Note (Signed)
10 day course of antibiotic prescribed, an saline flushes recommended

## 2013-07-26 NOTE — Assessment & Plan Note (Signed)
Deteriorated. Patient re-educated about  the importance of commitment to a  minimum of 150 minutes of exercise per week. The importance of healthy food choices with portion control discussed. Encouraged to start a food diary, count calories and to consider  joining a support group. Sample diet sheets offered. Goals set by the patient for the next several months.    

## 2013-08-04 LAB — BASIC METABOLIC PANEL
BUN: 13 mg/dL (ref 6–23)
CO2: 28 mEq/L (ref 19–32)
Calcium: 8.9 mg/dL (ref 8.4–10.5)
Chloride: 100 mEq/L (ref 96–112)
Creat: 0.86 mg/dL (ref 0.50–1.10)
Glucose, Bld: 112 mg/dL — ABNORMAL HIGH (ref 70–99)

## 2013-08-04 LAB — HEMOGLOBIN A1C: Mean Plasma Glucose: 131 mg/dL — ABNORMAL HIGH (ref <117)

## 2013-08-05 ENCOUNTER — Other Ambulatory Visit (HOSPITAL_COMMUNITY)
Admission: RE | Admit: 2013-08-05 | Discharge: 2013-08-05 | Disposition: A | Discharge status: Home / Self Care | Payer: BC Managed Care – PPO | Source: Ambulatory Visit | Attending: Family Medicine | Admitting: Family Medicine

## 2013-08-05 ENCOUNTER — Encounter: Payer: Self-pay | Admitting: Family Medicine

## 2013-08-05 ENCOUNTER — Ambulatory Visit (INDEPENDENT_AMBULATORY_CARE_PROVIDER_SITE_OTHER): Payer: BC Managed Care – PPO | Admitting: Family Medicine

## 2013-08-05 VITALS — BP 108/74 | HR 100 | Resp 18 | Ht 66.0 in | Wt 194.1 lb

## 2013-08-05 DIAGNOSIS — Z1151 Encounter for screening for human papillomavirus (HPV): Secondary | ICD-10-CM | POA: Insufficient documentation

## 2013-08-05 DIAGNOSIS — R7303 Prediabetes: Secondary | ICD-10-CM

## 2013-08-05 DIAGNOSIS — R7309 Other abnormal glucose: Secondary | ICD-10-CM

## 2013-08-05 DIAGNOSIS — Z1211 Encounter for screening for malignant neoplasm of colon: Secondary | ICD-10-CM

## 2013-08-05 DIAGNOSIS — Z01419 Encounter for gynecological examination (general) (routine) without abnormal findings: Secondary | ICD-10-CM | POA: Insufficient documentation

## 2013-08-05 DIAGNOSIS — Z2911 Encounter for prophylactic immunotherapy for respiratory syncytial virus (RSV): Secondary | ICD-10-CM

## 2013-08-05 DIAGNOSIS — Z0000 Encounter for general adult medical examination without abnormal findings: Secondary | ICD-10-CM

## 2013-08-05 LAB — POC HEMOCCULT BLD/STL (OFFICE/1-CARD/DIAGNOSTIC)

## 2013-08-05 MED ORDER — VENLAFAXINE HCL ER 75 MG PO CP24: 75.0000 mg | ORAL_CAPSULE | Freq: Every day | ORAL | Status: DC

## 2013-08-05 MED ORDER — AMLODIPINE-ATORVASTATIN 5-40 MG PO TABS: 1.0000 | ORAL_TABLET | Freq: Every day | ORAL | Status: DC

## 2013-08-05 MED ORDER — METFORMIN HCL ER (MOD) 1000 MG PO TB24: 1000.0000 mg | ORAL_TABLET | Freq: Every day | ORAL | Status: DC

## 2013-08-05 MED ORDER — ATENOLOL 25 MG PO TABS: 25.0000 mg | ORAL_TABLET | Freq: Two times a day (BID) | ORAL | Status: DC

## 2013-08-05 MED ORDER — VALSARTAN-HYDROCHLOROTHIAZIDE 80-12.5 MG PO TABS: 1.0000 | ORAL_TABLET | Freq: Every day | ORAL | Status: DC

## 2013-08-05 NOTE — Patient Instructions (Signed)
F/u in 4 month, call if you need me before  Change in metformin to 1000mg  once daily, take two 500mg  tablets once daily till done  Zostavax today  Pleasecommit to 3 days per week of "my time" to start regular exercise program and anything else you wish for fun  HBa1C chem 7 and eGFr in 4 month, non fast before visit

## 2013-08-05 NOTE — Progress Notes (Signed)
   Subjective:    Patient ID: Alexis Hurst, female    DOB: 1950-10-21, 62 y.o.   MRN: [id number]  HPI Pt in for annual exam. No specific problems to be addressed at visit concurrently    Review of Systems See HPI Denies recent fever or chills. Denies sinus pressure, nasal congestion, ear pain or sore throat. Denies chest congestion, productive cough or wheezing. Denies chest pains, palpitations and leg swelling Denies abdominal pain, nausea, vomiting,diarrhea or constipation.   Denies dysuria, frequency, hesitancy or incontinence. Denies joint pain, swelling and limitation in mobility. Denies headaches, seizures, numbness, or tingling. Denies uncontrolled  depression, anxiety or insomnia. Denies skin break down or rash.        Objective:   Physical Exam  Pleasant well nourished female, alert and oriented x 3, in no cardio-pulmonary distress. Afebrile. HEENT No facial trauma or asymetry. Sinuses non tender.  EOMI, PERTL, fundoscopic exam i no hemorhage or exudate.  External ears normal, tympanic membranes clear. Oropharynx moist, no exudate, good dentition. Neck: supple, no adenopathy,JVD or thyromegaly.No bruits.  Chest: Clear to ascultation bilaterally.No crackles or wheezes. Non tender to palpation  Breast: No asymetry,no masses. No nipple discharge or inversion. No axillary or supraclavicular adenopathy  Cardiovascular system; Heart sounds normal,  S1 and  S2 ,no S3.  No murmur, or thrill. Apical beat not displaced Peripheral pulses normal.  Abdomen: Soft, non tender, no organomegaly or masses. No bruits. Bowel sounds normal. No guarding, tenderness or rebound.  Rectal:  No mass. Guaiac negative stool.  GU: External genitalia normal. No lesions. Vaginal canal normal.Normal physiologic  discharge. Uterus absent , no adnexal masses, no adnexal tenderness.  Musculoskeletal exam: Full ROM of spine, hips , shoulders and knees. No deformity  ,swelling or crepitus noted. No muscle wasting or atrophy.   Neurologic: Cranial nerves 2 to 12 intact. Power, tone ,sensation and reflexes normal throughout. No disturbance in gait. No tremor.  Skin: Intact, no ulceration, erythema , scaling or rash noted. Pigmentation normal throughout  Psych; Normal mood and affect. Judgement and concentration normal       Assessment & Plan:

## 2013-09-02 ENCOUNTER — Other Ambulatory Visit: Payer: Self-pay | Admitting: Family Medicine

## 2013-09-08 ENCOUNTER — Encounter: Payer: Self-pay | Admitting: Family Medicine

## 2013-09-08 DIAGNOSIS — Z0000 Encounter for general adult medical examination without abnormal findings: Secondary | ICD-10-CM | POA: Insufficient documentation

## 2013-09-08 NOTE — Assessment & Plan Note (Signed)
Exam as documented. Pt to commit to changes in lifestyle to improve her health, which involves daily exercise, reduced caloric intake and less carbs, more personal time and increased rest. Zostavax given and completes needed  vaccines

## 2013-11-25 ENCOUNTER — Encounter: Payer: Self-pay | Admitting: Gastroenterology

## 2013-11-25 NOTE — Progress Notes (Signed)
SEP 2014 TICS/IH

## 2013-12-03 ENCOUNTER — Telehealth: Payer: Self-pay | Admitting: Family Medicine

## 2013-12-03 MED ORDER — ZOLPIDEM TARTRATE ER 12.5 MG PO TBCR: EXTENDED_RELEASE_TABLET | ORAL | Status: DC

## 2013-12-03 NOTE — Telephone Encounter (Signed)
Med refilled.

## 2013-12-09 LAB — COMPLETE METABOLIC PANEL WITH GFR
ALT: 19 U/L (ref 0–35)
AST: 21 U/L (ref 0–37)
Albumin: 3.8 g/dL (ref 3.5–5.2)
Alkaline Phosphatase: 81 U/L (ref 39–117)
BUN: 14 mg/dL (ref 6–23)
CO2: 29 mEq/L (ref 19–32)
Calcium: 9.1 mg/dL (ref 8.4–10.5)
Chloride: 103 mEq/L (ref 96–112)
Creat: 0.74 mg/dL (ref 0.50–1.10)
GFR, Est African American: >89 mL/min
GFR, Est Non African American: 88 mL/min
Glucose, Bld: 86 mg/dL (ref 70–99)
Potassium: 3.8 mEq/L (ref 3.5–5.3)
Sodium: 140 mEq/L (ref 135–145)
Total Bilirubin: 0.4 mg/dL (ref 0.2–1.2)
Total Protein: 7 g/dL (ref 6.0–8.3)

## 2013-12-09 LAB — HEMOGLOBIN A1C
Hgb A1c MFr Bld: 6 % — ABNORMAL HIGH (ref <5.7)
Mean Plasma Glucose: 126 mg/dL — ABNORMAL HIGH (ref <117)

## 2013-12-10 ENCOUNTER — Encounter: Payer: Self-pay | Admitting: Family Medicine

## 2013-12-10 ENCOUNTER — Ambulatory Visit (INDEPENDENT_AMBULATORY_CARE_PROVIDER_SITE_OTHER): Payer: BC Managed Care – PPO | Admitting: Family Medicine

## 2013-12-10 VITALS — BP 112/74 | HR 86 | Resp 16 | Wt 189.0 lb

## 2013-12-10 DIAGNOSIS — K5730 Diverticulosis of large intestine without perforation or abscess without bleeding: Secondary | ICD-10-CM

## 2013-12-10 DIAGNOSIS — F329 Major depressive disorder, single episode, unspecified: Secondary | ICD-10-CM

## 2013-12-10 DIAGNOSIS — M542 Cervicalgia: Secondary | ICD-10-CM

## 2013-12-10 DIAGNOSIS — K5790 Diverticulosis of intestine, part unspecified, without perforation or abscess without bleeding: Secondary | ICD-10-CM

## 2013-12-10 DIAGNOSIS — M949 Disorder of cartilage, unspecified: Secondary | ICD-10-CM

## 2013-12-10 DIAGNOSIS — E785 Hyperlipidemia, unspecified: Secondary | ICD-10-CM

## 2013-12-10 DIAGNOSIS — M899 Disorder of bone, unspecified: Secondary | ICD-10-CM

## 2013-12-10 DIAGNOSIS — F3289 Other specified depressive episodes: Secondary | ICD-10-CM

## 2013-12-10 DIAGNOSIS — R7303 Prediabetes: Secondary | ICD-10-CM

## 2013-12-10 DIAGNOSIS — I10 Essential (primary) hypertension: Secondary | ICD-10-CM

## 2013-12-10 DIAGNOSIS — E669 Obesity, unspecified: Secondary | ICD-10-CM

## 2013-12-10 DIAGNOSIS — R7309 Other abnormal glucose: Secondary | ICD-10-CM

## 2013-12-10 DIAGNOSIS — G4700 Insomnia, unspecified: Secondary | ICD-10-CM

## 2013-12-10 MED ORDER — DICLOFENAC SODIUM 1 % TD GEL: TRANSDERMAL | Status: DC

## 2013-12-10 MED ORDER — CYCLOBENZAPRINE HCL 10 MG PO TABS: 10.0000 mg | ORAL_TABLET | Freq: Every day | ORAL | Status: DC

## 2013-12-10 MED ORDER — VENLAFAXINE HCL ER 75 MG PO CP24: 75.0000 mg | ORAL_CAPSULE | Freq: Every day | ORAL | Status: DC

## 2013-12-10 MED ORDER — ATENOLOL 25 MG PO TABS
25.0000 mg | ORAL_TABLET | Freq: Two times a day (BID) | ORAL | Status: DC
Start: 2013-12-10 — End: 2014-07-21

## 2013-12-10 NOTE — Patient Instructions (Signed)
F/u in 4 month, call if you need me before  Congrats on your acclompishment  Fasting lipid, chem 7, hBA1C, TSH, vit D and CBC    Neck exercises twice daily, also use voltaren gel as directed for neck pain   Call back re leg pain if worsens

## 2013-12-11 DIAGNOSIS — K5790 Diverticulosis of intestine, part unspecified, without perforation or abscess without bleeding: Secondary | ICD-10-CM

## 2013-12-11 HISTORY — DX: Diverticulosis of intestine, part unspecified, without perforation or abscess without bleeding: K57.90

## 2013-12-11 NOTE — Assessment & Plan Note (Signed)
Controlled, no change in medication DASH diet and commitment to daily physical activity for a minimum of 30 minutes discussed and encouraged, as a part of hypertension management. The importance of attaining a healthy weight is also discussed.  

## 2013-12-11 NOTE — Progress Notes (Signed)
   Subjective:    Patient ID: Alexis Hurst, female    DOB: June 16, 1951, 63 y.o.   MRN: [id number]  HPI The PT is here for follow up and re-evaluation of chronic medical conditions, medication management and review of any available recent lab and radiology data.  Preventive health is updated, specifically  Cancer screening and Immunization.   Questions or concerns regarding consultations or procedures which the PT has had in the interim are  addressed. The PT denies any adverse reactions to current medications since the last visit.  2 week h/o left neck pain and reduced mobility with some stiffness, no trauma, no upper extremity symptoms Has  changed diet and has started dancing as exercise with weight loss success, and is encouraged    Review of Systems See HPI Denies recent fever or chills. Denies sinus pressure, nasal congestion, ear pain or sore throat. Denies chest congestion, productive cough or wheezing. Denies chest pains, palpitations and leg swelling Denies abdominal pain, nausea, vomiting,diarrhea or constipation.   Denies dysuria, frequency, hesitancy or incontinence.  Denies headaches, seizures, numbness, or tingling. Denies depression, anxiety or insomnia. Denies skin break down or rash.        Objective:   Physical Exam BP 112/74  Pulse 86  Resp 16  Wt 189 lb (85.73 kg)  SpO2 96% Patient alert and oriented and in no cardiopulmonary distress.  HEENT: No facial asymmetry, EOMI, no sinus tenderness,  oropharynx pink and moist.  Neck decreased ROM with  no adenopathy.  Chest: Clear to auscultation bilaterally.  CVS: S1, S2 no murmurs, no S3.  ABD: Soft non tender. Bowel sounds normal.  Ext: No edema  MS: Adequate ROM spine, shoulders, hips and knees.  Skin: Intact, no ulcerations or rash noted.  Psych: Good eye contact, normal affect. Memory intact not anxious or depressed appearing.  CNS: CN 2-12 intact, power, tone and sensation normal  throughout.        Assessment & Plan:  HYPERTENSION Controlled, no change in medication DASH diet and commitment to daily physical activity for a minimum of 30 minutes discussed and encouraged, as a part of hypertension management. The importance of attaining a healthy weight is also discussed.   Prediabetes Improved Patient educated about the importance of limiting  Carbohydrate intake , the need to commit to daily physical activity for a minimum of 30 minutes , and to commit weight loss. The fact that changes in all these areas will reduce or eliminate all together the development of diabetes is stressed.     INSOMNIA Sleep hygiene reviewed and written information offered also. Prescription sent for  medication needed.   Neck pain on left side Topical anti inflammatory, neck exercises, and massage. Will call back in 2 to 4 weeks if no improvement or worsens , xray and PT were recommended , but wants to hold off at this time  OBESITY Improved. Pt applauded on succesful weight loss through lifestyle change, and encouraged to continue same. Weight loss goal set for the next several months.   DEPRESSION Controlled, no change in medication

## 2013-12-11 NOTE — Assessment & Plan Note (Signed)
Controlled, no change in medication  

## 2013-12-11 NOTE — Assessment & Plan Note (Signed)
Improved. Pt applauded on succesful weight loss through lifestyle change, and encouraged to continue same. Weight loss goal set for the next several months.  

## 2013-12-11 NOTE — Assessment & Plan Note (Signed)
Sleep hygiene reviewed and written information offered also. Prescription sent for  medication needed.  

## 2013-12-11 NOTE — Assessment & Plan Note (Signed)
Improved Patient educated about the importance of limiting  Carbohydrate intake , the need to commit to daily physical activity for a minimum of 30 minutes , and to commit weight loss. The fact that changes in all these areas will reduce or eliminate all together the development of diabetes is stressed.

## 2013-12-11 NOTE — Assessment & Plan Note (Signed)
Topical anti inflammatory, neck exercises, and massage. Will call back in 2 to 4 weeks if no improvement or worsens , xray and PT were recommended , but wants to hold off at this time

## 2014-03-14 ENCOUNTER — Other Ambulatory Visit: Payer: Self-pay | Admitting: Family Medicine

## 2014-03-18 ENCOUNTER — Telehealth: Payer: Self-pay

## 2014-03-18 ENCOUNTER — Other Ambulatory Visit: Payer: Self-pay

## 2014-03-18 MED ORDER — ZOLPIDEM TARTRATE 10 MG PO TABS: 10.0000 mg | ORAL_TABLET | Freq: Every evening | ORAL | Status: DC | PRN

## 2014-03-18 NOTE — Telephone Encounter (Signed)
Med sent.

## 2014-03-18 NOTE — Telephone Encounter (Signed)
Ambien CR 12.5mg  not covered by insurance. Patient states she will be willing to try the regular zolpidem 10mg . Please advise. Uses [facility]

## 2014-03-18 NOTE — Telephone Encounter (Signed)
Changer to regular zol[pidem 10mg  same dosing please

## 2014-04-07 ENCOUNTER — Other Ambulatory Visit: Payer: Self-pay | Admitting: Family Medicine

## 2014-04-08 ENCOUNTER — Ambulatory Visit: Payer: BC Managed Care – PPO | Admitting: Family Medicine

## 2014-04-17 LAB — CBC
HCT: 35.8 % — ABNORMAL LOW (ref 36.0–46.0)
Hemoglobin: 12 g/dL (ref 12.0–15.0)
MCH: 29.1 pg (ref 26.0–34.0)
MCHC: 33.5 g/dL (ref 30.0–36.0)
MCV: 86.9 fL (ref 78.0–100.0)
PLATELETS: 327 10*3/uL (ref 150–400)
RBC: 4.12 MIL/uL (ref 3.87–5.11)
RDW: 14.6 % (ref 11.5–15.5)
WBC: 4.5 10*3/uL (ref 4.0–10.5)

## 2014-04-17 LAB — LIPID PANEL
CHOL/HDL RATIO: 2 ratio
Cholesterol: 150 mg/dL (ref 0–200)
HDL: 74 mg/dL (ref >39)
LDL Cholesterol: 63 mg/dL (ref 0–99)
Triglycerides: 67 mg/dL (ref <150)
VLDL: 13 mg/dL (ref 0–40)

## 2014-04-17 LAB — BASIC METABOLIC PANEL
BUN: 17 mg/dL (ref 6–23)
CHLORIDE: 104 meq/L (ref 96–112)
CO2: 31 meq/L (ref 19–32)
Calcium: 9.6 mg/dL (ref 8.4–10.5)
Creat: 0.91 mg/dL (ref 0.50–1.10)
Glucose, Bld: 97 mg/dL (ref 70–99)
POTASSIUM: 4.2 meq/L (ref 3.5–5.3)
SODIUM: 142 meq/L (ref 135–145)

## 2014-04-17 LAB — TSH: TSH: 0.483 u[IU]/mL (ref 0.350–4.500)

## 2014-04-17 LAB — HEMOGLOBIN A1C
HEMOGLOBIN A1C: 6.2 % — AB (ref <5.7)
MEAN PLASMA GLUCOSE: 131 mg/dL — AB (ref <117)

## 2014-04-18 LAB — VITAMIN D 25 HYDROXY (VIT D DEFICIENCY, FRACTURES): VIT D 25 HYDROXY: 30 ng/mL (ref 30–89)

## 2014-04-20 ENCOUNTER — Ambulatory Visit (INDEPENDENT_AMBULATORY_CARE_PROVIDER_SITE_OTHER): Payer: BC Managed Care – PPO | Admitting: Family Medicine

## 2014-04-20 ENCOUNTER — Encounter: Payer: Self-pay | Admitting: Family Medicine

## 2014-04-20 VITALS — BP 104/68 | HR 98 | Resp 18 | Ht 66.0 in | Wt 191.1 lb

## 2014-04-20 DIAGNOSIS — R7303 Prediabetes: Secondary | ICD-10-CM

## 2014-04-20 DIAGNOSIS — I10 Essential (primary) hypertension: Secondary | ICD-10-CM

## 2014-04-20 DIAGNOSIS — Z23 Encounter for immunization: Secondary | ICD-10-CM | POA: Insufficient documentation

## 2014-04-20 DIAGNOSIS — M542 Cervicalgia: Secondary | ICD-10-CM

## 2014-04-20 DIAGNOSIS — G4700 Insomnia, unspecified: Secondary | ICD-10-CM | POA: Diagnosis not present

## 2014-04-20 DIAGNOSIS — F329 Major depressive disorder, single episode, unspecified: Secondary | ICD-10-CM

## 2014-04-20 DIAGNOSIS — R7309 Other abnormal glucose: Secondary | ICD-10-CM

## 2014-04-20 DIAGNOSIS — F411 Generalized anxiety disorder: Secondary | ICD-10-CM

## 2014-04-20 DIAGNOSIS — F3289 Other specified depressive episodes: Secondary | ICD-10-CM

## 2014-04-20 DIAGNOSIS — M545 Low back pain, unspecified: Secondary | ICD-10-CM

## 2014-04-20 MED ORDER — CYCLOBENZAPRINE HCL 10 MG PO TABS: 10.0000 mg | ORAL_TABLET | Freq: Three times a day (TID) | ORAL | Status: DC | PRN

## 2014-04-20 MED ORDER — ZOLPIDEM TARTRATE ER 12.5 MG PO TBCR: 12.5000 mg | EXTENDED_RELEASE_TABLET | Freq: Every evening | ORAL | Status: DC | PRN

## 2014-04-20 MED ORDER — CYCLOBENZAPRINE HCL 10 MG PO TABS: 10.0000 mg | ORAL_TABLET | Freq: Every day | ORAL | Status: DC

## 2014-04-20 MED ORDER — AMLODIPINE-ATORVASTATIN 5-40 MG PO TABS: 1.0000 | ORAL_TABLET | Freq: Every day | ORAL | Status: DC

## 2014-04-20 NOTE — Patient Instructions (Addendum)
F/u with rectal Dec 4 or after, call ifytou need me before  Call in Sept for flu vaccine  Prevnar today  Work on sleep so that you can reduce dependence on sleep aid, the goal is to be able to do without eventually    REDUCE diovan/hctz, TAKE hALF tablet daily, B P is over corrected  Pls reduce sweet tea and pepsi, blood sugar has increased  HBA1C cmp and EGFR in Dec before f/u  Flexeril sent in for as needed use for back spasm, 30 tabs to last 4 month

## 2014-04-20 NOTE — Assessment & Plan Note (Addendum)
Overcorrected, reduce to half diovan/hctz daily

## 2014-04-26 NOTE — Assessment & Plan Note (Signed)
Improved, no med change 

## 2014-04-26 NOTE — Assessment & Plan Note (Signed)
Pt re educated and encouraged to reduce dependence on sleep aid due to increasingly challenging s/e of memory loss and falls as aging continues. Agrees to try, but has great reservations as to success, stating this is a family problem

## 2014-04-26 NOTE — Assessment & Plan Note (Signed)
Deteriorated Patient educated about the importance of limiting  Carbohydrate intake , the need to commit to daily physical activity for a minimum of 30 minutes , and to commit weight loss. The fact that changes in all these areas will reduce or eliminate all together the development of diabetes is stressed.    

## 2014-04-26 NOTE — Assessment & Plan Note (Signed)
Administered at visit 

## 2014-04-26 NOTE — Assessment & Plan Note (Addendum)
redyucution and d/c chronic  NSAID use encouraged, use of tylenol and muscle relaxant advised, non radiaiting

## 2014-04-26 NOTE — Assessment & Plan Note (Signed)
Controlled, no change in medication Son now out of prison and learning a trade , which is beneficial to pt's mental health

## 2014-04-26 NOTE — Progress Notes (Signed)
   Subjective:    Patient ID: Alexis Hurst, female    DOB: August 12, 1951, 63 y.o.   MRN: [id number]  HPI The PT is here for follow up and re-evaluation of chronic medical conditions, medication management and review of any available recent lab and radiology data.  Preventive health is updated, specifically  Cancer screening and Immunization.   Questions or concerns regarding consultations or procedures which the PT has had in the interim are  addressed. The PT denies any adverse reactions to current medications since the last visit.  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 Improved de[pression and anxiety , son no longer incarcerated Concerned re sleep management and long term plan this is discussed and she is encouraged to work on behavioral therapy as an alternative to medication  Diet uncontrolled with ongoing increased intake of sweetened drinks , states she will work on changing     Review of Systems See HPI Denies recent fever or chills. Denies sinus pressure, nasal congestion, ear pain or sore throat. Denies chest congestion, productive cough or wheezing. Denies chest pains, palpitations and leg swelling Denies abdominal pain, nausea, vomiting,diarrhea or constipation.   Denies dysuria, frequency, hesitancy or incontinence.  Denies headaches, seizures, numbness, or tingling. Denies skin break down or rash.        Objective:   Physical Exam  BP 104/68  Pulse 98  Resp 18  Ht 5\' 6"  (1.676 m)  Wt 191 lb 1.9 oz (86.691 kg)  BMI 30.86 kg/m2  SpO2 97% Patient alert and oriented and in no cardiopulmonary distress.  HEENT: No facial asymmetry, EOMI,   oropharynx pink and moist.  Neck supple no JVD, no mass.  Chest: Clear to auscultation bilaterally.  CVS: S1, S2 no murmurs, no S3.Regular rate.  ABD: Soft non tender.   Ext: No edema  MS: Adequate ROM spine , shoulders, hips and  knees.  Skin: Intact, no ulcerations or rash noted.  Psych: Good eye contact, normal affect. Memory intact not anxious or depressed appearing.  CNS: CN 2-12 intact, power,  normal throughout.no focal deficits noted.       Assessment & Plan:  HYPERTENSION Overcorrected, reduce to half diovan/hctz daily  Prediabetes Deteriorated Patient educated about the importance of limiting  Carbohydrate intake , the need to commit to daily physical activity for a minimum of 30 minutes , and to commit weight loss. The fact that changes in all these areas will reduce or eliminate all together the development of diabetes is stressed.     Need for vaccination with 13-polyvalent pneumococcal conjugate vaccine Administered at visit  INSOMNIA Pt re educated and encouraged to reduce dependence on sleep aid due to increasingly challenging s/e of memory loss and falls as aging continues. Agrees to try, but has great reservations as to success, stating this is a family problem  DEPRESSION Controlled, no change in medication Son now out of prison and learning a trade , which is beneficial to pt's mental health   Back pain redyucution and d/c chronic  NSAID use encouraged, use of tylenol and muscle relaxant advised, non radiaiting  ANXIETY Improved, no med change

## 2014-05-26 ENCOUNTER — Other Ambulatory Visit: Payer: Self-pay | Admitting: Family Medicine

## 2014-05-26 DIAGNOSIS — Z1231 Encounter for screening mammogram for malignant neoplasm of breast: Secondary | ICD-10-CM

## 2014-06-01 ENCOUNTER — Ambulatory Visit (HOSPITAL_COMMUNITY)
Admission: RE | Admit: 2014-06-01 | Discharge: 2014-06-01 | Disposition: A | Discharge status: Home / Self Care | Payer: BC Managed Care – PPO | Source: Ambulatory Visit | Attending: Family Medicine | Admitting: Family Medicine

## 2014-06-01 DIAGNOSIS — Z1231 Encounter for screening mammogram for malignant neoplasm of breast: Secondary | ICD-10-CM | POA: Diagnosis present

## 2014-06-12 ENCOUNTER — Other Ambulatory Visit: Payer: Self-pay | Admitting: Family Medicine

## 2014-07-15 ENCOUNTER — Telehealth: Payer: Self-pay

## 2014-07-15 NOTE — Telephone Encounter (Signed)
Called and left message for patient to return call.  

## 2014-07-15 NOTE — Telephone Encounter (Addendum)
She seems to be calling with concerns about varicose veins/ spider veins, if she has a vein concern she can be referred to [facility], if does not have mainly a vein concern , burning leg pain may be due to nerve irritation , if she is on doubt about what the prob is start with work in next week pls

## 2014-07-17 NOTE — Telephone Encounter (Signed)
Spoke with patient and she would like to be seen next week.  Appointment given.

## 2014-07-19 ENCOUNTER — Other Ambulatory Visit: Payer: Self-pay | Admitting: Family Medicine

## 2014-07-21 ENCOUNTER — Ambulatory Visit (INDEPENDENT_AMBULATORY_CARE_PROVIDER_SITE_OTHER): Payer: BC Managed Care – PPO | Admitting: Family Medicine

## 2014-07-21 ENCOUNTER — Encounter: Payer: Self-pay | Admitting: Family Medicine

## 2014-07-21 ENCOUNTER — Ambulatory Visit (INDEPENDENT_AMBULATORY_CARE_PROVIDER_SITE_OTHER): Payer: BC Managed Care – PPO

## 2014-07-21 VITALS — BP 108/70 | HR 83 | Resp 16 | Ht 66.0 in | Wt 192.0 lb

## 2014-07-21 DIAGNOSIS — M549 Dorsalgia, unspecified: Secondary | ICD-10-CM

## 2014-07-21 DIAGNOSIS — I10 Essential (primary) hypertension: Secondary | ICD-10-CM

## 2014-07-21 DIAGNOSIS — Z23 Encounter for immunization: Secondary | ICD-10-CM

## 2014-07-21 DIAGNOSIS — M25579 Pain in unspecified ankle and joints of unspecified foot: Secondary | ICD-10-CM

## 2014-07-21 DIAGNOSIS — M51379 Other intervertebral disc degeneration, lumbosacral region without mention of lumbar back pain or lower extremity pain: Secondary | ICD-10-CM

## 2014-07-21 DIAGNOSIS — F5105 Insomnia due to other mental disorder: Secondary | ICD-10-CM

## 2014-07-21 DIAGNOSIS — F064 Anxiety disorder due to known physiological condition: Secondary | ICD-10-CM

## 2014-07-21 DIAGNOSIS — R7303 Prediabetes: Secondary | ICD-10-CM

## 2014-07-21 DIAGNOSIS — F418 Other specified anxiety disorders: Secondary | ICD-10-CM

## 2014-07-21 DIAGNOSIS — E669 Obesity, unspecified: Secondary | ICD-10-CM

## 2014-07-21 DIAGNOSIS — E66811 Obesity, class 1: Secondary | ICD-10-CM

## 2014-07-21 DIAGNOSIS — M25571 Pain in right ankle and joints of right foot: Secondary | ICD-10-CM

## 2014-07-21 DIAGNOSIS — M5137 Other intervertebral disc degeneration, lumbosacral region: Secondary | ICD-10-CM

## 2014-07-21 DIAGNOSIS — M25572 Pain in left ankle and joints of left foot: Secondary | ICD-10-CM

## 2014-07-21 DIAGNOSIS — Z13220 Encounter for screening for lipoid disorders: Secondary | ICD-10-CM

## 2014-07-21 DIAGNOSIS — R7309 Other abnormal glucose: Secondary | ICD-10-CM

## 2014-07-21 DIAGNOSIS — F419 Anxiety disorder, unspecified: Secondary | ICD-10-CM

## 2014-07-21 MED ORDER — PHENTERMINE HCL 37.5 MG PO TABS: 37.5000 mg | ORAL_TABLET | Freq: Every day | ORAL | Status: DC

## 2014-07-21 NOTE — Patient Instructions (Addendum)
F/u in 3.5 month, call if you need me before  Flu vaccine today  Chem 7 and EGFR and HBA1C today  Fasting lipid, cmp and eGFR and hBA1C in 3.5 month  You are pre diabetic, PLEASE eat like a diabetic, to prevent yourself froom becoming diabetic, aLSO work on daily exercise   You are referred for MRI lumbar spine due to c/o back pain with right lower ext weakness, and bilateral leg tingling pain   Continue ambien every night for sleep and good sleep hygiene  You will get info on carb restricted diet, and sign up for next class to help you please  Resume phentermine HALF tablet daily to help with weight loss  Xray of both ankles due to c/o pain  Veins look normal on exam, , only slightly swollen superficial veins,    Flu vaccine today

## 2014-07-22 LAB — BASIC METABOLIC PANEL WITH GFR
BUN: 11 mg/dL (ref 6–23)
CHLORIDE: 103 meq/L (ref 96–112)
CO2: 27 meq/L (ref 19–32)
Calcium: 9.5 mg/dL (ref 8.4–10.5)
Creat: 0.76 mg/dL (ref 0.50–1.10)
GFR, Est African American: >89 mL/min
GFR, Est Non African American: 85 mL/min
GLUCOSE: 96 mg/dL (ref 70–99)
POTASSIUM: 4.1 meq/L (ref 3.5–5.3)
SODIUM: 141 meq/L (ref 135–145)

## 2014-07-22 LAB — HEMOGLOBIN A1C
Hgb A1c MFr Bld: 6.1 % — ABNORMAL HIGH (ref <5.7)
MEAN PLASMA GLUCOSE: 128 mg/dL — AB (ref <117)

## 2014-07-23 ENCOUNTER — Ambulatory Visit (HOSPITAL_COMMUNITY)
Admission: RE | Admit: 2014-07-23 | Discharge: 2014-07-23 | Disposition: A | Discharge status: Home / Self Care | Payer: BC Managed Care – PPO | Source: Ambulatory Visit | Attending: Family Medicine | Admitting: Family Medicine

## 2014-07-23 ENCOUNTER — Other Ambulatory Visit: Payer: Self-pay

## 2014-07-23 DIAGNOSIS — M25572 Pain in left ankle and joints of left foot: Secondary | ICD-10-CM | POA: Insufficient documentation

## 2014-07-23 DIAGNOSIS — M25579 Pain in unspecified ankle and joints of unspecified foot: Secondary | ICD-10-CM

## 2014-07-23 DIAGNOSIS — M25571 Pain in right ankle and joints of right foot: Secondary | ICD-10-CM | POA: Diagnosis not present

## 2014-07-23 MED ORDER — LORAZEPAM 1 MG PO TABS: ORAL_TABLET | ORAL | Status: DC

## 2014-07-24 DIAGNOSIS — M25572 Pain in left ankle and joints of left foot: Secondary | ICD-10-CM | POA: Insufficient documentation

## 2014-07-24 DIAGNOSIS — M25579 Pain in unspecified ankle and joints of unspecified foot: Secondary | ICD-10-CM | POA: Insufficient documentation

## 2014-07-24 DIAGNOSIS — Z23 Encounter for immunization: Secondary | ICD-10-CM | POA: Insufficient documentation

## 2014-07-24 NOTE — Assessment & Plan Note (Signed)
Controlled, no change in medication  

## 2014-07-24 NOTE — Assessment & Plan Note (Signed)
Worsened w,t rigth thigh numbness and weakness on exam, needs MRI lubar spine

## 2014-07-24 NOTE — Assessment & Plan Note (Addendum)
Progressive pain with right lower extremity weakness and numbness in thigh as well as bilateral lower extremity burning pain, oprevious xray shows arthritis and possible disc disease in 2014, needs MRI lumbar spine , based on current exam and  history

## 2014-07-24 NOTE — Assessment & Plan Note (Signed)
X ray joint to further evaluate

## 2014-07-24 NOTE — Assessment & Plan Note (Signed)
Vaccine administered at visit.  

## 2014-07-24 NOTE — Assessment & Plan Note (Signed)
Sleep hygiene reviewed and written information offered also. Prescription sent for  medication needed.  

## 2014-07-24 NOTE — Assessment & Plan Note (Signed)
Unchanged. Patient re-educated about  the importance of commitment to a  minimum of 150 minutes of exercise per week. The importance of healthy food choices with portion control discussed. Encouraged to start a food diary, count calories and to consider  joining a support group. Sample diet sheets offered. Goals set by the patient for the next several months.    

## 2014-07-24 NOTE — Assessment & Plan Note (Signed)
Controlled, no change in medication DASH diet and commitment to daily physical activity for a minimum of 30 minutes discussed and encouraged, as a part of hypertension management. The importance of attaining a healthy weight is also discussed.  

## 2014-07-24 NOTE — Assessment & Plan Note (Signed)
Slight improvement Patient educated about the importance of limiting  Carbohydrate intake , the need to commit to daily physical activity for a minimum of 30 minutes , and to commit weight loss. The fact that changes in all these areas will reduce or eliminate all together the development of diabetes is stressed.

## 2014-07-24 NOTE — Progress Notes (Signed)
Subjective:    Patient ID: PEDRO OLDENBURG, female    DOB: 03-Sep-1951, 63 y.o.   MRN: [id number]  HPI Pt in with concerns of increased and uncontrolled back pain radiitng down right lower extremity with reduced sensation and weakness in the right thigh, She states symptoms are worsening, no recent back trauma, denies incontinence of stool or urine C/obilateral ankle pain and deformity, denies trauma C/o noting veins under skin in her legs, denies pain , tenderness or bruising associated with the veins Concerned about weight and wants medication to help with weight loss to improve blood sugar Has tried to discontinue ambien and finds sleep impossible without it Needs flu vaccine today  Review of Systems See HPI Denies recent fever or chills. Denies sinus pressure, nasal congestion, ear pain or sore throat. Denies chest congestion, productive cough or wheezing. Denies chest pains, palpitations and leg swelling Denies abdominal pain, nausea, vomiting,diarrhea or constipation.   Denies dysuria, frequency, hesitancy or incontinence. Denies skin break down or rash.        Objective:   Physical Exam  BP 108/70 mmHg  Pulse 83  Resp 16  Ht 5\' 6"  (1.676 m)  Wt 192 lb (87.091 kg)  BMI 31.00 kg/m2  SpO2 97% Patient alert and oriented and in no cardiopulmonary distress.  HEENT: No facial asymmetry, EOMI,   oropharynx pink and moist.  Neck supple no JVD, no mass.  Chest: Clear to auscultation bilaterally.  CVS: S1, S2 no murmurs, no S3.Regular rate.  ABD: Soft non tender.   Ext: No edema  MS: Decreased  ROM lumbar  Spine,adequate in  shoulders, hips and knees.  Skin: Intact, no ulcerations or rash noted.  Psych: Good eye contact, normal affect. Memory intact not anxious or depressed appearing.  CNS: CN 2-12 intact, grade 4 power in right lower extremity , weakness of thigh abduction reduced sensation in right thigh, normal power and tone and sensation in the other 3  extremities     Assessment & Plan:  DDD (degenerative disc disease), lumbosacral Progressive pain with right lower extremity weakness and numbness in thigh as well as bilateral lower extremity burning pain, oprevious xray shows arthritis and possible disc disease in 2014, needs MRI lumbar spine , based on current exam and  history  Back pain with radiation Worsened w,t rigth thigh numbness and weakness on exam, needs MRI lubar spine  Pain in joint, ankle and foot X ray ankle to further evaluate pain  Pain in left ankle X ray joint to further evaluate  Hyposomnia, insomnia or sleeplessness associated with anxiety Sleep hygiene reviewed and written information offered also. Prescription sent for  medication needed.   Depression with anxiety Controlled, no change in medication   Essential hypertension Controlled, no change in medication DASH diet and commitment to daily physical activity for a minimum of 30 minutes discussed and encouraged, as a part of hypertension management. The importance of attaining a healthy weight is also discussed.   Obesity (BMI 30.0-34.9) Unchanged. Patient re-educated about  the importance of commitment to a  minimum of 150 minutes of exercise per week. The importance of healthy food choices with portion control discussed. Encouraged to start a food diary, count calories and to consider  joining a support group. Sample diet sheets offered. Goals set by the patient for the next several months.     Prediabetes Slight improvement Patient educated about the importance of limiting  Carbohydrate intake , the need to commit to daily physical activity  for a minimum of 30 minutes , and to commit weight loss. The fact that changes in all these areas will reduce or eliminate all together the development of diabetes is stressed.     Need for prophylactic vaccination and inoculation against influenza Vaccine administered at visit.

## 2014-07-24 NOTE — Assessment & Plan Note (Signed)
X ray ankle to further evaluate pain

## 2014-07-29 ENCOUNTER — Ambulatory Visit (HOSPITAL_COMMUNITY): Payer: BC Managed Care – PPO

## 2014-08-16 ENCOUNTER — Other Ambulatory Visit: Payer: Self-pay | Admitting: Family Medicine

## 2014-08-24 LAB — COMPLETE METABOLIC PANEL WITH GFR
ALK PHOS: 85 U/L (ref 39–117)
ALT: 17 U/L (ref 0–35)
AST: 20 U/L (ref 0–37)
Albumin: 4 g/dL (ref 3.5–5.2)
BILIRUBIN TOTAL: 0.3 mg/dL (ref 0.2–1.2)
BUN: 12 mg/dL (ref 6–23)
CO2: 28 mEq/L (ref 19–32)
Calcium: 8.9 mg/dL (ref 8.4–10.5)
Chloride: 102 mEq/L (ref 96–112)
Creat: 0.75 mg/dL (ref 0.50–1.10)
GFR, Est African American: >89 mL/min
GFR, Est Non African American: 86 mL/min
Glucose, Bld: 95 mg/dL (ref 70–99)
Potassium: 3.9 mEq/L (ref 3.5–5.3)
SODIUM: 141 meq/L (ref 135–145)
Total Protein: 7 g/dL (ref 6.0–8.3)

## 2014-08-24 LAB — LIPID PANEL
CHOL/HDL RATIO: 2.2 ratio
Cholesterol: 128 mg/dL (ref 0–200)
HDL: 57 mg/dL (ref >39)
LDL Cholesterol: 58 mg/dL (ref 0–99)
Triglycerides: 65 mg/dL (ref <150)
VLDL: 13 mg/dL (ref 0–40)

## 2014-08-24 LAB — HEMOGLOBIN A1C
Hgb A1c MFr Bld: 5.9 % — ABNORMAL HIGH (ref <5.7)
Mean Plasma Glucose: 123 mg/dL — ABNORMAL HIGH (ref <117)

## 2014-08-25 ENCOUNTER — Encounter: Payer: Self-pay | Admitting: Family Medicine

## 2014-08-25 ENCOUNTER — Ambulatory Visit (INDEPENDENT_AMBULATORY_CARE_PROVIDER_SITE_OTHER): Payer: BC Managed Care – PPO | Admitting: Family Medicine

## 2014-08-25 ENCOUNTER — Other Ambulatory Visit (HOSPITAL_COMMUNITY)
Admission: RE | Admit: 2014-08-25 | Discharge: 2014-08-25 | Disposition: A | Discharge status: Home / Self Care | Payer: BC Managed Care – PPO | Source: Ambulatory Visit | Attending: Family Medicine | Admitting: Family Medicine

## 2014-08-25 VITALS — BP 114/78 | HR 81 | Resp 16 | Ht 66.0 in | Wt 188.0 lb

## 2014-08-25 DIAGNOSIS — R0609 Other forms of dyspnea: Secondary | ICD-10-CM

## 2014-08-25 DIAGNOSIS — Z01419 Encounter for gynecological examination (general) (routine) without abnormal findings: Secondary | ICD-10-CM | POA: Diagnosis present

## 2014-08-25 DIAGNOSIS — R079 Chest pain, unspecified: Secondary | ICD-10-CM | POA: Insufficient documentation

## 2014-08-25 DIAGNOSIS — R9431 Abnormal electrocardiogram [ECG] [EKG]: Secondary | ICD-10-CM

## 2014-08-25 DIAGNOSIS — R7309 Other abnormal glucose: Secondary | ICD-10-CM

## 2014-08-25 DIAGNOSIS — R7303 Prediabetes: Secondary | ICD-10-CM

## 2014-08-25 DIAGNOSIS — Z0000 Encounter for general adult medical examination without abnormal findings: Secondary | ICD-10-CM

## 2014-08-25 DIAGNOSIS — R072 Precordial pain: Secondary | ICD-10-CM

## 2014-08-25 DIAGNOSIS — Z1211 Encounter for screening for malignant neoplasm of colon: Secondary | ICD-10-CM

## 2014-08-25 DIAGNOSIS — I10 Essential (primary) hypertension: Secondary | ICD-10-CM

## 2014-08-25 DIAGNOSIS — Z124 Encounter for screening for malignant neoplasm of cervix: Secondary | ICD-10-CM

## 2014-08-25 LAB — POC HEMOCCULT BLD/STL (OFFICE/1-CARD/DIAGNOSTIC): Fecal Occult Blood, POC: NEGATIVE

## 2014-08-25 MED ORDER — METFORMIN HCL ER (MOD) 1000 MG PO TB24: ORAL_TABLET | ORAL | Status: DC

## 2014-08-25 MED ORDER — VALSARTAN-HYDROCHLOROTHIAZIDE 80-12.5 MG PO TABS: ORAL_TABLET | ORAL | Status: DC

## 2014-08-25 NOTE — Patient Instructions (Addendum)
F/u in 4.5 monthj, callif you need mebefore  Blood sugar has improved, and cholesterol , liver and kidney function are excellen  EKG today is not "totally normal" and based on your history ,a s we had discussed , you are referred to cardiology for furhter eval  Call the insurance company that denied your MRI and ask that they provide a reason, so this can be followed up on  HBa1C chem 7 non fast in 4.5 month

## 2014-08-25 NOTE — Assessment & Plan Note (Signed)

## 2014-08-25 NOTE — Assessment & Plan Note (Signed)
Four month, h/o chest pain associated with stress and anxiety as well as increased exertional; fatigue. Has HTN and prediabetes  EKG today and refer to cardiology

## 2014-08-26 ENCOUNTER — Ambulatory Visit: Payer: Self-pay | Admitting: Cardiology

## 2014-08-26 LAB — CYTOLOGY - PAP

## 2014-08-29 NOTE — Progress Notes (Signed)
   Subjective:    Patient ID: Alexis Hurst, female    DOB: Jul 30, 1951, 63 y.o.   MRN: [id number]  HPI  Patient is in for annual physical exam. C/o 4  Month h/o left sided chest pain, most often brought on by emotional stress, however also notes exertional fatigue, and has personal h/o HTN, hyperlipidemia andIiGT  Review of Systems See HPI     Objective:   Physical Exam  BP 114/78 mmHg  Pulse 81  Resp 16  Ht 5\' 6"  (1.676 m)  Wt 188 lb (85.276 kg)  BMI 30.36 kg/m2  SpO2 99% Pleasant well nourished female, alert and oriented x 3, in no cardio-pulmonary distress. Afebrile. HEENT No facial trauma or asymetry. Sinuses non tender.  Extra occullar muscles intact, pupils equally reactive to light. External ears normal, tympanic membranes clear. Oropharynx moist, no exudate, good dentition. Neck: supple, no adenopathy,JVD or thyromegaly.No bruits.  Chest: Clear to ascultation bilaterally.No crackles or wheezes. Non tender to palpation. No reproducible chest wall pain  Breast: No asymetry,no masses or lumps. No tenderness. No nipple discharge or inversion. No axillary or supraclavicular adenopathy  Cardiovascular system; Heart sounds normal,  S1 and  S2 ,no S3.  No murmur, or thrill. Apical beat not displaced Peripheral pulses normal. KG: sinus rythm, with non specific,st changes , possibly representing ischemia in anterior and  lateral leads Abdomen: Soft, non tender, no organomegaly or masses. No bruits. Bowel sounds normal. No guarding, tenderness or rebound.  Rectal:  Normal sphincter tone. No mass.No rectal masses.  Guaiac negative stool.  GU: External genitalia normal female genitalia , female distribution of hair. No lesions. Urethral meatus normal in size, no  Prolapse, no lesions visibly  Present. Bladder non tender. Vagina pink and moist , with no visible lesions , discharge present . Adequate pelvic support no  cystocele or rectocele noted  Uterus  absent, no adnexal masses, no adnexal tenderness.   Musculoskeletal exam: Full ROM of spine, hips , shoulders and knees. No deformity ,swelling or crepitus noted. No muscle wasting or atrophy.   Neurologic: Cranial nerves 2 to 12 intact. Power, tone ,sensation and reflexes normal throughout. No disturbance in gait. No tremor.  Skin: Intact, no ulceration, erythema , scaling or rash noted. Pigmentation normal throughout  Psych; Normal mood and affect. Judgement and concentration normal       Assessment & Plan:  Annual physical exam Annual exam as documented. Counseling done  re healthy lifestyle involving commitment to 150 minutes exercise per week, heart healthy diet, and attaining healthy weight.The importance of adequate sleep also discussed. Regular seat belt use and home safety, is also discussed. Changes in health habits are decided on by the patient with goals and time frames  set for achieving them. Immunization and cancer screening needs are specifically addressed at this visit.   Chest pain Four month, h/o chest pain associated with stress and anxiety as well as increased exertional; fatigue. Has HTN and prediabetes  EKG today and refer to cardiology

## 2014-08-31 ENCOUNTER — Encounter: Payer: Self-pay | Admitting: Internal Medicine

## 2014-08-31 ENCOUNTER — Ambulatory Visit (INDEPENDENT_AMBULATORY_CARE_PROVIDER_SITE_OTHER): Payer: BC Managed Care – PPO | Admitting: Internal Medicine

## 2014-08-31 VITALS — BP 142/90 | HR 95 | Ht 64.0 in | Wt 191.6 lb

## 2014-08-31 DIAGNOSIS — I10 Essential (primary) hypertension: Secondary | ICD-10-CM

## 2014-08-31 DIAGNOSIS — R0789 Other chest pain: Secondary | ICD-10-CM

## 2014-08-31 NOTE — Progress Notes (Signed)
HPI Paitnet is a 63 yo who is followed by Bari Mantis  Hx of HTN  and glucose intolerance Seen in clinic last week  Complained of CP with emotional stress  Describes as  Pressure  Central  Lasts sometimes short  Sometimes can last for few hours. Is busy during day  [place] of young grandchildren  Will dance and play with them  Has to rest intermitt but has never had chest discomfort when doing this. Later in day will go to church and in evening go to help her parents  No chst pressure when getting around there. Does note she has slowed down over the past few years  No abrupt changes     Allergies  Allergen Reactions  . Lactose Intolerance (Gi) Diarrhea and Nausea And Vomiting    Current Outpatient Prescriptions  Medication Sig Dispense Refill  . amLODipine-atorvastatin (CADUET) 5-40 MG per tablet TAKE 1 TABLET BY MOUTH DAILY 90 tablet 0  . aspirin (ASPIRIN LOW DOSE) 81 MG EC tablet Take 81 mg by mouth daily.      Marland Kitchen atenolol (TENORMIN) 25 MG tablet TAKE 1 TABLET BY MOUTH TWICE DAILY 180 tablet 0  . Calcium Carbonate-Vitamin D (CALCIUM + D) 600-200 MG-UNIT TABS Take 1 tablet by mouth daily.     . cyclobenzaprine (FLEXERIL) 10 MG tablet Take 1 tablet (10 mg total) by mouth at bedtime. 30 tablet 0  . diclofenac sodium (VOLTAREN) 1 % GEL Apply to left side of neck 4 tiimes daily 100 g 4  . metFORMIN (GLUMETZA) 1000 MG (MOD) 24 hr tablet TAKE 1 TABLET BY MOUTH EVERY DAY WITH BREAKFAST 90 tablet 1  . Multiple Vitamin (MULTIVITAMIN WITH MINERALS) TABS tablet Take 1 tablet by mouth daily.    . valsartan-hydrochlorothiazide (DIOVAN HCT) 80-12.5 MG per tablet Half tablet daily effective 04/20/2014 45 tablet 1  . venlafaxine XR (EFFEXOR-XR) 75 MG 24 hr capsule Take 1 capsule (75 mg total) by mouth daily. 90 capsule 1  . zolpidem (AMBIEN CR) 12.5 MG CR tablet Take 1 tablet (12.5 mg total) by mouth at bedtime as needed for sleep. 30 tablet 5   No current facility-administered medications for this  visit.    Past Medical History  Diagnosis Date  . Chest discomfort     negative stress echo   . Raynauds phenomenon     possible   . Depression   . Fatigue   . Dizziness     mild Orthostatic  . Paresthesias     Upper extremity /episode sensation of coldness and numbness   . Mitral valve prolapse   . Anxiety   . Insomnia   . Obesity   . Hyperlipidemia   . Hypertension   . Diabetes mellitus     Past Surgical History  Procedure Laterality Date  . Vesicovaginal fistula closure w/ tah  1989  . Cystectomy l breast, benign    . Breast surgery    . Colonoscopy  06/11/2003    Smith:multiple medium scatered diverticula in the cecum, a sending colon, transverse colon, descending colon, sigmoid colon.  . Colonoscopy N/A 05/20/2013    TICS, SML IH  . Abdominal hysterectomy      Family History  Problem Relation Age of Onset  . Heart failure Father   . Cancer Father     lung   . Hypertension Mother   . Hypertension Sister   . Hypertension Sister   . Diabetes Brother   . Colon cancer Maternal Grandfather  age greater than 14  . Crohn's disease Brother     History   Social History  . Marital Status: Married    Spouse Name: N/A    Number of Children: 2  . Years of Education: N/A   Occupational History  . teacher     Social History Main Topics  . Smoking status: Former Smoker    Quit date: 09/04/1986  . Smokeless tobacco: Never Used  . Alcohol Use: No  . Drug Use: No  . Sexual Activity: Not on file   Other Topics Concern  . Not on file   Social History Narrative    Review of Systems:  All systems reviewed.  They are negative to the above problem except as previously stated.  Vital Signs: BP 142/90 mmHg  Pulse 95  Ht 5\' 4"  (1.626 m)  Wt 191 lb 9.6 oz (86.909 kg)  BMI 32.87 kg/m2 BP on my check 124/82 Physical Exam  HEENT:  Normocephalic, atraumatic. EOMI, PERRLA.  Neck: JVP is normal.  No bruits.  Lungs: clear to auscultation. No rales no wheezes.   Heart: Regular rate and rhythm. Normal S1, S2. No S3.   No significant murmurs. PMI not displaced.  Abdomen:  Supple, nontender. Normal bowel sounds. No masses. No hepatomegaly.  Extremities:   Good distal pulses throughout. No lower extremity edema.  Musculoskeletal :moving all extremities.  Neuro:   alert and oriented x3.  CN II-XII grossly intact.  EKG  08/25/04  SR  84  LVH  ST changes consistent with early repolarization.  Unchanged from EKG from 2010 Assessment and Plan:  Patinet is a 63 yo who is referred for evaluation of chest discomfort.   Episodes are only with mental stress  NOt with physcial activity    EKG changes are nonspecific.  I am not convinced that patients spells of chest pressure represent coronary ischemia.  May be more GI in origin   I would follow  Encouraged her to try to limit the responsibilites that she take on  I encouraged her to increase her activity WIll plan f/u prn  I would not sched further testing now  2.  HTN  Adequate control  3.  Lipids  Good control  4.  DM  F?U with Dr Moshe Cipro.

## 2014-08-31 NOTE — Patient Instructions (Signed)
Your physician recommends that you schedule a follow-up appointment in: only as needed.       Thank you for choosing [facility] !         

## 2014-09-07 ENCOUNTER — Encounter: Payer: Self-pay | Admitting: Family Medicine

## 2014-09-14 ENCOUNTER — Encounter: Payer: Self-pay | Admitting: Cardiology

## 2014-09-25 ENCOUNTER — Other Ambulatory Visit: Payer: Self-pay | Admitting: Family Medicine

## 2014-09-30 ENCOUNTER — Other Ambulatory Visit: Payer: Self-pay

## 2014-09-30 MED ORDER — ATENOLOL 25 MG PO TABS: 25.0000 mg | ORAL_TABLET | Freq: Two times a day (BID) | ORAL | Status: DC

## 2014-10-02 ENCOUNTER — Telehealth: Payer: Self-pay

## 2014-10-02 MED ORDER — MOMETASONE FUROATE 50 MCG/ACT NA SUSP
NASAL | Status: DC
Start: 2014-10-02 — End: 2018-02-07

## 2014-10-02 NOTE — Addendum Note (Signed)
Addended by: Denman George B on: 10/02/2014 01:07 PM   Modules accepted: Orders

## 2014-10-02 NOTE — Telephone Encounter (Signed)
pls advise ans send in nasonex 2 puffs per nostril daily and advise use of OTC loratidine one daily, also OTC  saline nasal flushes 2 to 3 times daily No mention made of fever , chills or symptoms c/w infection so treated as uncontrolled allergies, pls educate and explain

## 2014-10-02 NOTE — Telephone Encounter (Signed)
Called and left message for patient notifying of rx.  rx sent to pharmacy.

## 2014-10-03 LAB — BASIC METABOLIC PANEL
BUN: 9 mg/dL (ref 6–23)
CHLORIDE: 104 meq/L (ref 96–112)
CO2: 26 meq/L (ref 19–32)
Calcium: 9.5 mg/dL (ref 8.4–10.5)
Creat: 0.74 mg/dL (ref 0.50–1.10)
Glucose, Bld: 94 mg/dL (ref 70–99)
POTASSIUM: 4.3 meq/L (ref 3.5–5.3)
SODIUM: 140 meq/L (ref 135–145)

## 2014-10-03 LAB — HEMOGLOBIN A1C
Hgb A1c MFr Bld: 6 % — ABNORMAL HIGH (ref <5.7)
MEAN PLASMA GLUCOSE: 126 mg/dL — AB (ref <117)

## 2014-10-07 ENCOUNTER — Ambulatory Visit: Payer: BC Managed Care – PPO | Admitting: Family Medicine

## 2014-10-07 ENCOUNTER — Encounter: Payer: Self-pay | Admitting: Family Medicine

## 2014-10-07 ENCOUNTER — Other Ambulatory Visit: Payer: Self-pay

## 2014-10-07 VITALS — BP 118/74 | HR 83 | Resp 16 | Ht 64.0 in | Wt 189.0 lb

## 2014-10-07 DIAGNOSIS — I10 Essential (primary) hypertension: Secondary | ICD-10-CM

## 2014-10-07 MED ORDER — ATENOLOL 25 MG PO TABS: 25.0000 mg | ORAL_TABLET | Freq: Two times a day (BID) | ORAL | Status: DC

## 2014-10-07 MED ORDER — VALSARTAN-HYDROCHLOROTHIAZIDE 80-12.5 MG PO TABS: ORAL_TABLET | ORAL | Status: DC

## 2014-10-07 MED ORDER — AMLODIPINE-ATORVASTATIN 5-40 MG PO TABS: 1.0000 | ORAL_TABLET | Freq: Every day | ORAL | Status: DC

## 2014-10-07 MED ORDER — METFORMIN HCL ER (MOD) 1000 MG PO TB24: ORAL_TABLET | ORAL | Status: DC

## 2014-10-07 NOTE — Progress Notes (Signed)
   Subjective:    Patient ID: Alexis Hurst, female    DOB: Jun 02, 1951, 64 y.o.   MRN: [id number]  HPI No viist  \Pt called in prematurely, and she has no complaints/concerns   Review of Systems     Objective:   Physical Exam        Assessment & Plan:

## 2014-10-20 ENCOUNTER — Other Ambulatory Visit: Payer: Self-pay

## 2014-10-20 DIAGNOSIS — G4700 Insomnia, unspecified: Secondary | ICD-10-CM

## 2014-10-20 MED ORDER — ZOLPIDEM TARTRATE ER 12.5 MG PO TBCR: 12.5000 mg | EXTENDED_RELEASE_TABLET | Freq: Every evening | ORAL | Status: DC | PRN

## 2014-11-17 ENCOUNTER — Other Ambulatory Visit: Payer: Self-pay

## 2014-11-17 MED ORDER — VENLAFAXINE HCL ER 75 MG PO CP24: 75.0000 mg | ORAL_CAPSULE | Freq: Every day | ORAL | Status: DC

## 2014-11-21 ENCOUNTER — Other Ambulatory Visit: Payer: Self-pay | Admitting: Family Medicine

## 2014-12-17 ENCOUNTER — Other Ambulatory Visit: Payer: Self-pay

## 2014-12-17 MED ORDER — GLUCOSE BLOOD VI STRP: ORAL_STRIP | Status: DC

## 2014-12-25 ENCOUNTER — Other Ambulatory Visit: Payer: Self-pay

## 2014-12-25 MED ORDER — ATENOLOL 25 MG PO TABS: 25.0000 mg | ORAL_TABLET | Freq: Two times a day (BID) | ORAL | Status: DC

## 2014-12-30 ENCOUNTER — Other Ambulatory Visit: Payer: Self-pay | Admitting: Family Medicine

## 2014-12-31 ENCOUNTER — Other Ambulatory Visit: Payer: Self-pay | Admitting: Family Medicine

## 2015-01-12 ENCOUNTER — Ambulatory Visit: Payer: Self-pay | Admitting: Family Medicine

## 2015-02-04 ENCOUNTER — Other Ambulatory Visit: Payer: Self-pay

## 2015-02-04 MED ORDER — ZOLPIDEM TARTRATE ER 12.5 MG PO TBCR: EXTENDED_RELEASE_TABLET | ORAL | Status: DC

## 2015-02-15 ENCOUNTER — Ambulatory Visit (INDEPENDENT_AMBULATORY_CARE_PROVIDER_SITE_OTHER): Payer: BC Managed Care – PPO | Admitting: Family Medicine

## 2015-02-15 ENCOUNTER — Encounter: Payer: Self-pay | Admitting: Family Medicine

## 2015-02-15 ENCOUNTER — Other Ambulatory Visit: Payer: Self-pay | Admitting: Family Medicine

## 2015-02-15 VITALS — BP 120/80 | HR 88 | Resp 16 | Ht 64.0 in | Wt 189.0 lb

## 2015-02-15 DIAGNOSIS — R7303 Prediabetes: Secondary | ICD-10-CM

## 2015-02-15 DIAGNOSIS — F5105 Insomnia due to other mental disorder: Secondary | ICD-10-CM

## 2015-02-15 DIAGNOSIS — E785 Hyperlipidemia, unspecified: Secondary | ICD-10-CM

## 2015-02-15 DIAGNOSIS — F419 Anxiety disorder, unspecified: Secondary | ICD-10-CM

## 2015-02-15 DIAGNOSIS — R7309 Other abnormal glucose: Secondary | ICD-10-CM

## 2015-02-15 DIAGNOSIS — R1013 Epigastric pain: Secondary | ICD-10-CM | POA: Diagnosis not present

## 2015-02-15 DIAGNOSIS — E669 Obesity, unspecified: Secondary | ICD-10-CM | POA: Diagnosis not present

## 2015-02-15 DIAGNOSIS — I10 Essential (primary) hypertension: Secondary | ICD-10-CM | POA: Diagnosis not present

## 2015-02-15 DIAGNOSIS — F064 Anxiety disorder due to known physiological condition: Secondary | ICD-10-CM

## 2015-02-15 MED ORDER — AMLODIPINE-ATORVASTATIN 5-40 MG PO TABS: 1.0000 | ORAL_TABLET | Freq: Every day | ORAL | Status: DC

## 2015-02-15 MED ORDER — VALSARTAN-HYDROCHLOROTHIAZIDE 80-12.5 MG PO TABS: ORAL_TABLET | ORAL | Status: DC

## 2015-02-15 NOTE — Assessment & Plan Note (Signed)
Sleep hygiene reviewed, sleeps on avg 6 hours per night. No med change

## 2015-02-15 NOTE — Assessment & Plan Note (Signed)
Unchanged. Patient re-educated about  the importance of commitment to a  minimum of 150 minutes of exercise per week.  The importance of healthy food choices with portion control discussed. Encouraged to start a food diary, count calories and to consider  joining a support group. Sample diet sheets offered. Goals set by the patient for the next several months.   Weight /BMI 02/15/2015 10/07/2014 08/31/2014  WEIGHT 189 lb 189 lb 191 lb 9.6 oz  HEIGHT 5\' 4"  5\' 4"  5\' 4"   BMI 32.43 kg/m2 32.43 kg/m2 32.87 kg/m2    Current exercise per week 90 minutes.

## 2015-02-15 NOTE — Assessment & Plan Note (Signed)
Hyperlipidemia:Low fat diet discussed and encouraged.   Lipid Panel  Lab Results  Component Value Date   CHOL 128 08/24/2014   HDL 57 08/24/2014   LDLCALC 58 08/24/2014   TRIG 65 08/24/2014   CHOLHDL 2.2 08/24/2014  Updated lab needed

## 2015-02-15 NOTE — Progress Notes (Signed)
Subjective:    Patient ID: Alexis Hurst, female    DOB: 31-Dec-1950, 64 y.o.   MRN: [id number]  HPI The PT is here for follow up and re-evaluation of chronic medical conditions, medication management and review of any available recent lab and radiology data.  Preventive health is updated, specifically  Cancer screening and Immunization.   Questions or concerns regarding consultations or procedures which the PT has had in the interim are  addressed. The PT denies any adverse reactions to current medications since the last visit.  3 weeks ago had a 3 day episode, started after eating food relieved by oTC PPI No known reflux symptoms, but notes froggy voice and unable to sing  Has just come through increased family responsibility , her sis was hospitalized, good thing is that she also just had a 1 week vacation and feels refreshed Denies uncontrolled depression or anxiety, sleeps for approx 6 hours     Review of Systems See HPI Denies recent fever or chills. Denies sinus pressure, nasal congestion, ear pain or sore throat. Denies chest congestion, productive cough or wheezing. Denies chest pains, palpitations and leg swelling Denies nausea, vomiting,diarrhea or constipation.   Denies dysuria, frequency, hesitancy or incontinence. Chronic  joint pain, swelling and limitation in mobility. Denies headaches, seizures, numbness, or tingling.  Denies skin break down or rash.        Objective:   Physical Exam BP 120/80 mmHg  Pulse 88  Resp 16  Ht 5\' 4"  (1.626 m)  Wt 189 lb (85.73 kg)  BMI 32.43 kg/m2  SpO2 97% Patient alert and oriented and in no cardiopulmonary distress.  HEENT: No facial asymmetry, EOMI,   oropharynx pink and moist.  Neck supple no JVD, no mass.  Chest: Clear to auscultation bilaterally.  CVS: S1, S2 no murmurs, no S3.Regular rate.  ABD: Soft non tender.   Ext: No edema  MS: Adequate ROM spine, shoulders, hips and knees.  Skin: Intact, no  ulcerations or rash noted.  Psych: Good eye contact, normal affect. Memory intact not anxious or depressed appearing.  CNS: CN 2-12 intact, power,  normal throughout.no focal deficits noted.        Assessment & Plan:  Essential hypertension Controlled, no change in medication DASH diet and commitment to daily physical activity for a minimum of 30 minutes discussed and encouraged, as a part of hypertension management. The importance of attaining a healthy weight is also discussed.  BP/Weight 02/15/2015 10/07/2014 08/31/2014 08/25/2014 07/21/2014 12/11/8117 09/08/7827  Systolic BP 562 130 865 784 696 295 284  Diastolic BP 80 74 90 78 70 68 74  Wt. (Lbs) 189 189 191.6 188 192 191.12 189  BMI 32.43 32.43 32.87 30.36 31 30.86 30.52        Prediabetes Patient educated about the importance of limiting  Carbohydrate intake , the need to commit to daily physical activity for a minimum of 30 minutes , and to commit weight loss. The fact that changes in all these areas will reduce or eliminate all together the development of diabetes is stressed.  Updated lab needed at/ before next visit.   Diabetic Labs Latest Ref Rng 10/02/2014 08/24/2014 07/21/2014 04/17/2014 12/09/2013  HbA1c <5.7 % 6.0(H) 5.9(H) 6.1(H) 6.2(H) 6.0(H)  Chol 0 - 200 mg/dL - 128 - 150 -  HDL >39 mg/dL - 57 - 74 -  Calc LDL 0 - 99 mg/dL - 58 - 63 -  Triglycerides <150 mg/dL - 65 - 67 -  Creatinine  0.50 - 1.10 mg/dL 0.74 0.75 0.76 0.91 0.74   BP/Weight 02/15/2015 10/07/2014 08/31/2014 08/25/2014 07/21/2014 2/44/9753 0/0/5110  Systolic BP 211 173 567 014 103 013 143  Diastolic BP 80 74 90 78 70 68 74  Wt. (Lbs) 189 189 191.6 188 192 191.12 189  BMI 32.43 32.43 32.87 30.36 31 30.86 30.52   No flowsheet data found.     Obesity (BMI 30.0-34.9) Unchanged. Patient re-educated about  the importance of commitment to a  minimum of 150 minutes of exercise per week.  The importance of healthy food choices with portion control  discussed. Encouraged to start a food diary, count calories and to consider  joining a support group. Sample diet sheets offered. Goals set by the patient for the next several months.   Weight /BMI 02/15/2015 10/07/2014 08/31/2014  WEIGHT 189 lb 189 lb 191 lb 9.6 oz  HEIGHT 5\' 4"  5\' 4"  5\' 4"   BMI 32.43 kg/m2 32.43 kg/m2 32.87 kg/m2    Current exercise per week 90 minutes.   Hyposomnia, insomnia or sleeplessness associated with anxiety Sleep hygiene reviewed, sleeps on avg 6 hours per night. No med change  Hyperlipidemia LDL goal <100 Hyperlipidemia:Low fat diet discussed and encouraged.   Lipid Panel  Lab Results  Component Value Date   CHOL 128 08/24/2014   HDL 57 08/24/2014   LDLCALC 58 08/24/2014   TRIG 65 08/24/2014   CHOLHDL 2.2 08/24/2014  Updated lab needed        Dyspepsia 3 day h/o epigastric pain, check breath test for possible H pylori infection

## 2015-02-15 NOTE — Assessment & Plan Note (Signed)
Patient educated about the importance of limiting  Carbohydrate intake , the need to commit to daily physical activity for a minimum of 30 minutes , and to commit weight loss. The fact that changes in all these areas will reduce or eliminate all together the development of diabetes is stressed.  Updated lab needed at/ before next visit.   Diabetic Labs Latest Ref Rng 10/02/2014 08/24/2014 07/21/2014 04/17/2014 12/09/2013  HbA1c <5.7 % 6.0(H) 5.9(H) 6.1(H) 6.2(H) 6.0(H)  Chol 0 - 200 mg/dL - 128 - 150 -  HDL >39 mg/dL - 57 - 74 -  Calc LDL 0 - 99 mg/dL - 58 - 63 -  Triglycerides <150 mg/dL - 65 - 67 -  Creatinine 0.50 - 1.10 mg/dL 0.74 0.75 0.76 0.91 0.74   BP/Weight 02/15/2015 10/07/2014 08/31/2014 08/25/2014 07/21/2014 2/80/0349 09/10/9148  Systolic BP 569 794 801 655 374 827 078  Diastolic BP 80 74 90 78 70 68 74  Wt. (Lbs) 189 189 191.6 188 192 191.12 189  BMI 32.43 32.43 32.87 30.36 31 30.86 30.52   No flowsheet data found.

## 2015-02-15 NOTE — Assessment & Plan Note (Signed)
3 day h/o epigastric pain, check breath test for possible H pylori infection

## 2015-02-15 NOTE — Assessment & Plan Note (Signed)
Controlled, no change in medication DASH diet and commitment to daily physical activity for a minimum of 30 minutes discussed and encouraged, as a part of hypertension management. The importance of attaining a healthy weight is also discussed.  BP/Weight 02/15/2015 10/07/2014 08/31/2014 08/25/2014 07/21/2014 10/08/5595 12/04/6382  Systolic BP 536 468 032 122 482 500 370  Diastolic BP 80 74 90 78 70 68 74  Wt. (Lbs) 189 189 191.6 188 192 191.12 189  BMI 32.43 32.43 32.87 30.36 31 30.86 30.52

## 2015-02-15 NOTE — Patient Instructions (Signed)
Annual physical exam in 4 months, call if you need me before   Pls get Breath test today as discussed  No phentermine and flexeril   Please work on good  health habits so that your health will improve. 1. Commitment to daily physical activity for 30 to 60  minutes, if you are able to do this.  2. Commitment to wise food choices. Aim for half of your  food intake to be vegetable and fruit, one quarter starchy foods, and one quarter protein. Try to eat on a regular schedule  3 meals per day, snacking between meals should be limited to vegetables or fruits or small portions of nuts. 64 ounces of water per day is generally recommended, unless you have specific health conditions, like heart failure or kidney failure where you will need to limit fluid intake.  3. Commitment to sufficient and a  good quality of physical and mental rest daily, generally between 6 to 8 hours per day.  WITH PERSISTANCE AND PERSEVERANCE, THE IMPOSSIBLE , BECOMES THE NORM!  Thanks for choosing [facility], we consider it a privelige to serve you.

## 2015-02-16 LAB — COMPREHENSIVE METABOLIC PANEL
ALT: 24 U/L (ref 0–35)
AST: 26 U/L (ref 0–37)
Albumin: 4 g/dL (ref 3.5–5.2)
Alkaline Phosphatase: 88 U/L (ref 39–117)
BILIRUBIN TOTAL: 0.4 mg/dL (ref 0.2–1.2)
BUN: 15 mg/dL (ref 6–23)
CO2: 27 mEq/L (ref 19–32)
CREATININE: 0.73 mg/dL (ref 0.50–1.10)
Calcium: 9.1 mg/dL (ref 8.4–10.5)
Chloride: 100 mEq/L (ref 96–112)
Glucose, Bld: 88 mg/dL (ref 70–99)
Potassium: 4.1 mEq/L (ref 3.5–5.3)
Sodium: 139 mEq/L (ref 135–145)
Total Protein: 7.5 g/dL (ref 6.0–8.3)

## 2015-02-16 LAB — LIPID PANEL
Cholesterol: 160 mg/dL (ref 0–200)
HDL: 86 mg/dL (ref >=46)
LDL Cholesterol: 60 mg/dL (ref 0–99)
TRIGLYCERIDES: 69 mg/dL (ref <150)
Total CHOL/HDL Ratio: 1.9 Ratio
VLDL: 14 mg/dL (ref 0–40)

## 2015-02-16 LAB — HEMOGLOBIN A1C
HEMOGLOBIN A1C: 6 % — AB (ref <5.7)
Mean Plasma Glucose: 126 mg/dL — ABNORMAL HIGH (ref <117)

## 2015-04-22 ENCOUNTER — Ambulatory Visit (INDEPENDENT_AMBULATORY_CARE_PROVIDER_SITE_OTHER): Payer: BC Managed Care – PPO | Admitting: Family Medicine

## 2015-04-22 ENCOUNTER — Encounter: Payer: Self-pay | Admitting: Family Medicine

## 2015-04-22 VITALS — BP 120/70 | HR 87 | Temp 98.8°F | Resp 16 | Ht 64.0 in | Wt 187.1 lb

## 2015-04-22 DIAGNOSIS — E669 Obesity, unspecified: Secondary | ICD-10-CM

## 2015-04-22 DIAGNOSIS — I10 Essential (primary) hypertension: Secondary | ICD-10-CM | POA: Diagnosis not present

## 2015-04-22 DIAGNOSIS — J0100 Acute maxillary sinusitis, unspecified: Secondary | ICD-10-CM

## 2015-04-22 MED ORDER — LEVOFLOXACIN 500 MG PO TABS: 500.0000 mg | ORAL_TABLET | Freq: Every day | ORAL | Status: DC

## 2015-04-22 MED ORDER — BENZONATATE 100 MG PO CAPS
100.0000 mg | ORAL_CAPSULE | Freq: Two times a day (BID) | ORAL | Status: DC
Start: 2015-04-22 — End: 2015-07-20

## 2015-04-22 NOTE — Patient Instructions (Addendum)
F/u as before, call if you need me sooner  You are treated a for acute sinusitis, levaquin for 1 week is prescribed  Decongestant tabs sent in for chest congestion  Thanks for choosing [facility], we consider it a privelige to serve you.  "Dirty chest will soon be clean"

## 2015-04-23 LAB — H. PYLORI BREATH TEST: H. pylori Breath Test: DETECTED — AB

## 2015-04-23 MED ORDER — AMOXICILL-CLARITHRO-LANSOPRAZ PO MISC: Freq: Two times a day (BID) | ORAL | Status: DC

## 2015-04-23 NOTE — Progress Notes (Signed)
   Alexis Hurst     MRN: [id number]      DOB: 1951/01/03   HPI Alexis Hurst is here  With a 2 week h/o excessive head congestion , post nasal drainage and facial pressure, having chills , body aches and fatigue. Denies ear pain, does have sore throat. Drainage is increasingly thick and yellow. C/o cough, esp when lying down, sputum is generally clear , and cough is not deep. No know strep or sick contact  ROS  Denies chest pains, palpitations and leg swelling Denies abdominal pain, nausea, vomiting,diarrhea or constipation.   Denies dysuria, frequency, hesitancy or incontinence. Denies joint pain, swelling and limitation in mobility. . Denies uncontrolled  depression, anxiety or insomnia. Denies skin break down or rash.   PE  BP 120/70 mmHg  Pulse 87  Temp(Src) 98.8 F (37.1 C) (Oral)  Resp 16  Ht 5\' 4"  (1.626 m)  Wt 187 lb 1.9 oz (84.877 kg)  BMI 32.10 kg/m2  SpO2 100%  Patient alert and oriented and in no cardiopulmonary distress. Appears tired  HEENT: No facial asymmetry, EOMI,   oropharynx pink and moist no exudate.  Neck supple no JVD, no mass.Bilateral maxillary sinus tenderness, and frontal tenderness, TM clear bilaterally, nasal mucosa erythematous and edematous  Chest: Clear to auscultation bilaterally.  CVS: S1, S2 no murmurs, no S3.Regular rate.  ABD: Soft non tender.   Ext: No edema  MS: Adequate ROM spine, shoulders, hips and knees.  Skin: Intact, no ulcerations or rash noted.  Psych: Good eye contact, normal affect. Memory intact not anxious or depressed appearing.  CNS: CN 2-12 intact, power,  normal throughout.no focal deficits noted.   Assessment & Plan   Acute maxillary sinusitis 1 week course of antibiotic prescribed, also decongestant  Essential hypertension Controlled, no change in medication   Obesity (BMI 30.0-34.9) Unchnaged Patient re-educated about  the importance of commitment to a  minimum of 150 minutes of exercise per  week.  The importance of healthy food choices with portion control discussed. Encouraged to start a food diary, count calories and to consider  joining a support group. Sample diet sheets offered. Goals set by the patient for the next several months.   Weight /BMI 04/22/2015 02/15/2015 10/07/2014  WEIGHT 187 lb 1.9 oz 189 lb 189 lb  HEIGHT 5\' 4"  5\' 4"  5\' 4"   BMI 32.1 kg/m2 32.43 kg/m2 32.43 kg/m2    Current exercise per week 90 minutes.

## 2015-04-23 NOTE — Assessment & Plan Note (Signed)
1 week course of antibiotic prescribed, also decongestant

## 2015-04-23 NOTE — Assessment & Plan Note (Signed)
Unchnaged Patient re-educated about  the importance of commitment to a  minimum of 150 minutes of exercise per week.  The importance of healthy food choices with portion control discussed. Encouraged to start a food diary, count calories and to consider  joining a support group. Sample diet sheets offered. Goals set by the patient for the next several months.   Weight /BMI 04/22/2015 02/15/2015 10/07/2014  WEIGHT 187 lb 1.9 oz 189 lb 189 lb  HEIGHT 5\' 4"  5\' 4"  5\' 4"   BMI 32.1 kg/m2 32.43 kg/m2 32.43 kg/m2    Current exercise per week 90 minutes.

## 2015-04-23 NOTE — Assessment & Plan Note (Signed)
Controlled, no change in medication  

## 2015-04-23 NOTE — Addendum Note (Signed)
Addended by: Eual Fines on: 04/23/2015 03:59 PM   Modules accepted: Orders

## 2015-04-26 ENCOUNTER — Telehealth: Payer: Self-pay | Admitting: *Deleted

## 2015-04-26 ENCOUNTER — Other Ambulatory Visit: Payer: Self-pay | Admitting: Family Medicine

## 2015-04-26 MED ORDER — CLARITHROMYCIN 500 MG PO TABS: 500.0000 mg | ORAL_TABLET | Freq: Two times a day (BID) | ORAL | Status: DC

## 2015-04-26 MED ORDER — LANSOPRAZOLE 30 MG PO CPDR
30.0000 mg | DELAYED_RELEASE_CAPSULE | Freq: Two times a day (BID) | ORAL | Status: DC
Start: 2015-04-26 — End: 2015-04-26

## 2015-04-26 MED ORDER — AMOXICILLIN 500 MG PO CAPS: 1000.0000 mg | ORAL_CAPSULE | Freq: Two times a day (BID) | ORAL | Status: DC

## 2015-04-26 NOTE — Telephone Encounter (Signed)
Pt called [facility] stating her insurance will not cover medication. Please advise

## 2015-04-26 NOTE — Telephone Encounter (Signed)
Separate meds sent in so they would be covered

## 2015-05-12 ENCOUNTER — Other Ambulatory Visit: Payer: Self-pay | Admitting: Family Medicine

## 2015-05-12 DIAGNOSIS — Z1231 Encounter for screening mammogram for malignant neoplasm of breast: Secondary | ICD-10-CM

## 2015-05-24 ENCOUNTER — Other Ambulatory Visit: Payer: Self-pay | Admitting: Family Medicine

## 2015-05-31 ENCOUNTER — Other Ambulatory Visit: Payer: Self-pay | Admitting: Family Medicine

## 2015-06-04 ENCOUNTER — Ambulatory Visit (HOSPITAL_COMMUNITY)
Admission: RE | Admit: 2015-06-04 | Discharge: 2015-06-04 | Disposition: A | Discharge status: Home / Self Care | Payer: BC Managed Care – PPO | Source: Ambulatory Visit | Attending: Family Medicine | Admitting: Family Medicine

## 2015-06-04 DIAGNOSIS — Z1231 Encounter for screening mammogram for malignant neoplasm of breast: Secondary | ICD-10-CM | POA: Insufficient documentation

## 2015-06-25 ENCOUNTER — Other Ambulatory Visit: Payer: Self-pay | Admitting: Family Medicine

## 2015-07-19 ENCOUNTER — Telehealth: Payer: Self-pay

## 2015-07-19 DIAGNOSIS — I10 Essential (primary) hypertension: Secondary | ICD-10-CM

## 2015-07-19 DIAGNOSIS — R7303 Prediabetes: Secondary | ICD-10-CM

## 2015-07-19 LAB — CBC
HCT: 37.8 % (ref 36.0–46.0)
HEMOGLOBIN: 12.8 g/dL (ref 12.0–15.0)
MCH: 29.8 pg (ref 26.0–34.0)
MCHC: 33.9 g/dL (ref 30.0–36.0)
MCV: 88.1 fL (ref 78.0–100.0)
MPV: 9.5 fL (ref 8.6–12.4)
Platelets: 301 10*3/uL (ref 150–400)
RBC: 4.29 MIL/uL (ref 3.87–5.11)
RDW: 15 % (ref 11.5–15.5)
WBC: 4.6 10*3/uL (ref 4.0–10.5)

## 2015-07-19 LAB — BASIC METABOLIC PANEL
BUN: 12 mg/dL (ref 7–25)
CO2: 30 mmol/L (ref 20–31)
Calcium: 9.4 mg/dL (ref 8.6–10.4)
Chloride: 102 mmol/L (ref 98–110)
Creat: 0.77 mg/dL (ref 0.50–0.99)
Glucose, Bld: 68 mg/dL (ref 65–99)
POTASSIUM: 4.2 mmol/L (ref 3.5–5.3)
SODIUM: 139 mmol/L (ref 135–146)

## 2015-07-19 NOTE — Telephone Encounter (Signed)
Labs ordered and faxed to [facility]  

## 2015-07-20 ENCOUNTER — Ambulatory Visit (INDEPENDENT_AMBULATORY_CARE_PROVIDER_SITE_OTHER): Payer: BC Managed Care – PPO | Admitting: Family Medicine

## 2015-07-20 ENCOUNTER — Encounter: Payer: Self-pay | Admitting: Family Medicine

## 2015-07-20 VITALS — BP 114/60 | HR 93 | Resp 18 | Wt 193.0 lb

## 2015-07-20 DIAGNOSIS — F5105 Insomnia due to other mental disorder: Secondary | ICD-10-CM

## 2015-07-20 DIAGNOSIS — E669 Obesity, unspecified: Secondary | ICD-10-CM

## 2015-07-20 DIAGNOSIS — I10 Essential (primary) hypertension: Secondary | ICD-10-CM | POA: Diagnosis not present

## 2015-07-20 DIAGNOSIS — K649 Unspecified hemorrhoids: Secondary | ICD-10-CM | POA: Insufficient documentation

## 2015-07-20 DIAGNOSIS — F418 Other specified anxiety disorders: Secondary | ICD-10-CM

## 2015-07-20 DIAGNOSIS — F064 Anxiety disorder due to known physiological condition: Secondary | ICD-10-CM

## 2015-07-20 DIAGNOSIS — Z23 Encounter for immunization: Secondary | ICD-10-CM | POA: Diagnosis not present

## 2015-07-20 DIAGNOSIS — F419 Anxiety disorder, unspecified: Secondary | ICD-10-CM

## 2015-07-20 DIAGNOSIS — R7303 Prediabetes: Secondary | ICD-10-CM

## 2015-07-20 HISTORY — DX: Unspecified hemorrhoids: K64.9

## 2015-07-20 LAB — HEMOGLOBIN A1C
HEMOGLOBIN A1C: 6.1 % — AB (ref <5.7)
MEAN PLASMA GLUCOSE: 128 mg/dL — AB (ref <117)

## 2015-07-20 LAB — TSH: TSH: 1.254 u[IU]/mL (ref 0.350–4.500)

## 2015-07-20 MED ORDER — HYDROCORTISONE ACETATE 10 % RE FOAM: 1.0000 | Freq: Two times a day (BID) | RECTAL | Status: DC

## 2015-07-20 NOTE — Assessment & Plan Note (Signed)
Controlled, no change in medication  

## 2015-07-20 NOTE — Progress Notes (Signed)
Subjective:    Patient ID: Alexis Hurst, female    DOB: 02-May-1951, 64 y.o.   MRN: [id number]  HPI   Alexis Hurst     MRN: [id number]      DOB: 11-19-1950   HPI Ms. Fazenbaker is here for follow up and re-evaluation of chronic medical conditions, medication management and review of any available recent lab and radiology data.  Preventive health is updated, specifically  Cancer screening and Immunization.    The PT denies any adverse reactions to current medications since the last visit.  Approx 3 week h/o "haing to strain at [employer]" despite increased fiber intake, as though something is blocking passage of stool, which she states is better formed, soft, not stringy or pebbly, feels like piles have "come down"[place] followed a 1 day bus trip  ROS Denies recent fever or chills. Denies sinus pressure, nasal congestion, ear pain or sore throat. Denies chest congestion, productive cough or wheezing. Denies chest pains, palpitations and leg swelling Denies abdominal pain, nausea, vomiting,diarrhea or constipation.   Denies dysuria, frequency, hesitancy or incontinence. Denies joint pain, swelling and limitation in mobility. Denies headaches, seizures, numbness, or tingling. Denies depression, anxiety or insomnia. Denies skin break down or rash.   PE  BP 114/60 mmHg  Pulse 93  Resp 18  Wt 193 lb (87.544 kg)  SpO2 96%  Patient alert and oriented and in no cardiopulmonary distress.  HEENT: No facial asymmetry, EOMI,   oropharynx pink and moist.  Neck supple no JVD, no mass.  Chest: Clear to auscultation bilaterally.  CVS: S1, S2 no murmurs, no S3.Regular rate.  ABD: Soft non tender. Rectal not done, but recent colonoscopy report reviewed, pt does have small internal piles  Ext: No edema  MS: Adequate ROM spine, shoulders, hips and knees.  Skin: Intact, no ulcerations or rash noted.  Psych: Good eye contact, normal affect. Memory intact not anxious or depressed  appearing.  CNS: CN 2-12 intact, power,  normal throughout.no focal deficits noted.   Assessment & Plan  Essential hypertension Controlled, no change in medication DASH diet and commitment to daily physical activity for a minimum of 30 minutes discussed and encouraged, as a part of hypertension management. The importance of attaining a healthy weight is also discussed.  BP/Weight 07/20/2015 04/22/2015 02/15/2015 10/07/2014 08/31/2014 08/25/2014 [id number]  Systolic BP 99991111 123456 123456 123456 [id number]  Diastolic BP 60 70 80 74 90 78 70  Wt. (Lbs) 193 187.12 189 189 191.6 188 192  BMI 33.11 32.1 32.43 32.43 32.87 30.36 31        Prediabetes Unchanged Patient educated about the importance of limiting  Carbohydrate intake , the need to commit to daily physical activity for a minimum of 30 minutes , and to commit weight loss. The fact that changes in all these areas will reduce or eliminate all together the development of diabetes is stressed.   Diabetic Labs Latest Ref Rng 07/19/2015 02/15/2015 10/02/2014 08/24/2014 07/21/2014  HbA1c <5.7 % 6.1(H) 6.0(H) 6.0(H) 5.9(H) 6.1(H)  Chol 0 - 200 mg/dL - 160 - 128 -  HDL >=46 mg/dL - 86 - 57 -  Calc LDL 0 - 99 mg/dL - 60 - 58 -  Triglycerides <150 mg/dL - 69 - 65 -  Creatinine 0.50 - 0.99 mg/dL 0.77 0.73 0.74 0.75 0.76   BP/Weight 07/20/2015 04/22/2015 02/15/2015 10/07/2014 08/31/2014 08/25/2014 [id number]  Systolic BP 99991111 123456 123456 123456 [id number]  Diastolic BP 60 70 80 74  90 78 70  Wt. (Lbs) 193 187.12 189 189 191.6 188 192  BMI 33.11 32.1 32.43 32.43 32.87 30.36 31   No flowsheet data found.   Updated lab needed at/ before next visit.   Obesity (BMI 30.0-34.9) Deteriorated. Patient re-educated about  the importance of commitment to a  minimum of 150 minutes of exercise per week.  The importance of healthy food choices with portion control discussed. Encouraged to start a food diary, count calories and to consider  joining a support  group. Sample diet sheets offered. Goals set by the patient for the next several months.   Weight /BMI 07/20/2015 04/22/2015 02/15/2015  WEIGHT 193 lb 187 lb 1.9 oz 189 lb  HEIGHT - 5\' 4"  5\' 4"   BMI 33.11 kg/m2 32.1 kg/m2 32.43 kg/m2    Current exercise per week 60 minutes.   Depression with anxiety Controlled, no change in medication   Piles (hemorrhoids) Current flare , topical suppositories for 10 days. If remains symptomatic , GI eval  Hyposomnia, insomnia or sleeplessness associated with anxiety unchanged Sleep hygiene reviewed and written information offered also. Prescription sent for  medication needed.       Review of Systems     Objective:   Physical Exam        Assessment & Plan:

## 2015-07-20 NOTE — Assessment & Plan Note (Signed)
Deteriorated. Patient re-educated about  the importance of commitment to a  minimum of 150 minutes of exercise per week.  The importance of healthy food choices with portion control discussed. Encouraged to start a food diary, count calories and to consider  joining a support group. Sample diet sheets offered. Goals set by the patient for the next several months.   Weight /BMI 07/20/2015 04/22/2015 02/15/2015  WEIGHT 193 lb 187 lb 1.9 oz 189 lb  HEIGHT - 5\' 4"  5\' 4"   BMI 33.11 kg/m2 32.1 kg/m2 32.43 kg/m2    Current exercise per week 60 minutes.

## 2015-07-20 NOTE — Assessment & Plan Note (Signed)
unchanged Sleep hygiene reviewed and written information offered also. Prescription sent for  medication needed.

## 2015-07-20 NOTE — Assessment & Plan Note (Signed)
Current flare , topical suppositories for 10 days. If remains symptomatic , GI eval

## 2015-07-20 NOTE — Assessment & Plan Note (Signed)
Controlled, no change in medication DASH diet and commitment to daily physical activity for a minimum of 30 minutes discussed and encouraged, as a part of hypertension management. The importance of attaining a healthy weight is also discussed.  BP/Weight 07/20/2015 04/22/2015 02/15/2015 10/07/2014 08/31/2014 08/25/2014 [id number]  Systolic BP 99991111 123456 123456 123456 [id number]  Diastolic BP 60 70 80 74 90 78 70  Wt. (Lbs) 193 187.12 189 189 191.6 188 192  BMI 33.11 32.1 32.43 32.43 32.87 30.36 31

## 2015-07-20 NOTE — Assessment & Plan Note (Signed)
Unchanged Patient educated about the importance of limiting  Carbohydrate intake , the need to commit to daily physical activity for a minimum of 30 minutes , and to commit weight loss. The fact that changes in all these areas will reduce or eliminate all together the development of diabetes is stressed.   Diabetic Labs Latest Ref Rng 07/19/2015 02/15/2015 10/02/2014 08/24/2014 07/21/2014  HbA1c <5.7 % 6.1(H) 6.0(H) 6.0(H) 5.9(H) 6.1(H)  Chol 0 - 200 mg/dL - 160 - 128 -  HDL >=46 mg/dL - 86 - 57 -  Calc LDL 0 - 99 mg/dL - 60 - 58 -  Triglycerides <150 mg/dL - 69 - 65 -  Creatinine 0.50 - 0.99 mg/dL 0.77 0.73 0.74 0.75 0.76   BP/Weight 07/20/2015 04/22/2015 02/15/2015 10/07/2014 08/31/2014 08/25/2014 [id number]  Systolic BP 99991111 123456 123456 123456 [id number]  Diastolic BP 60 70 80 74 90 78 70  Wt. (Lbs) 193 187.12 189 189 191.6 188 192  BMI 33.11 32.1 32.43 32.43 32.87 30.36 31   No flowsheet data found.   Updated lab needed at/ before next visit.

## 2015-07-20 NOTE — Patient Instructions (Signed)
CPE in 4 month, call if you need me before  Flu vaccine today  Ambien will be PA'd, and refill current  Med sent for hemmorhoids call in 2 weeks if stiill a problem  Cough and drainage are due to allergies, without fever , just need dAILY claritin    You are referred to diabetic educator  Blood sugar slightly increased so pLEASE keep appt  Please work on good  health habits so that your health will improve. 1. Commitment to daily physical activity for 30 to 60  minutes, if you are able to do this.  2. Commitment to wise food choices. Aim for half of your  food intake to be vegetable and fruit, one quarter starchy foods, and one quarter protein. Try to eat on a regular schedule  3 meals per day, snacking between meals should be limited to vegetables or fruits or small portions of nuts. 64 ounces of water per day is generally recommended, unless you have specific health conditions, like heart failure or kidney failure where you will need to limit fluid intake.  3. Commitment to sufficient and a  good quality of physical and mental rest daily, generally between 6 to 8 hours per day.  WITH PERSISTANCE AND PERSEVERANCE, THE IMPOSSIBLE , BECOMES THE NORM!  Thanks for choosing [facility], we consider it a privelige to serve you.   HBA1 C in 4 month and chem 7

## 2015-07-21 ENCOUNTER — Other Ambulatory Visit: Payer: Self-pay

## 2015-07-21 DIAGNOSIS — I10 Essential (primary) hypertension: Secondary | ICD-10-CM

## 2015-07-21 MED ORDER — ZOLPIDEM TARTRATE ER 12.5 MG PO TBCR: EXTENDED_RELEASE_TABLET | ORAL | Status: DC

## 2015-07-21 MED ORDER — VALSARTAN-HYDROCHLOROTHIAZIDE 80-12.5 MG PO TABS: ORAL_TABLET | ORAL | Status: DC

## 2015-07-21 MED ORDER — VENLAFAXINE HCL ER 75 MG PO CP24: ORAL_CAPSULE | ORAL | Status: DC

## 2015-08-12 ENCOUNTER — Other Ambulatory Visit: Payer: Self-pay | Admitting: Family Medicine

## 2015-08-22 ENCOUNTER — Other Ambulatory Visit: Payer: Self-pay | Admitting: Family Medicine

## 2015-08-23 ENCOUNTER — Other Ambulatory Visit: Payer: Self-pay | Admitting: Family Medicine

## 2015-08-31 ENCOUNTER — Other Ambulatory Visit: Payer: Self-pay | Admitting: Family Medicine

## 2015-09-03 ENCOUNTER — Encounter: Payer: Self-pay | Admitting: Nutrition

## 2015-09-03 ENCOUNTER — Encounter: Payer: BC Managed Care – PPO | Attending: Family Medicine | Admitting: Nutrition

## 2015-09-03 VITALS — Ht 64.0 in | Wt 195.0 lb

## 2015-09-03 DIAGNOSIS — E669 Obesity, unspecified: Secondary | ICD-10-CM | POA: Diagnosis not present

## 2015-09-03 DIAGNOSIS — E785 Hyperlipidemia, unspecified: Secondary | ICD-10-CM | POA: Diagnosis not present

## 2015-09-03 DIAGNOSIS — R739 Hyperglycemia, unspecified: Secondary | ICD-10-CM | POA: Diagnosis present

## 2015-09-03 DIAGNOSIS — Z713 Dietary counseling and surveillance: Secondary | ICD-10-CM | POA: Insufficient documentation

## 2015-09-03 DIAGNOSIS — Z6833 Body mass index (BMI) 33.0-33.9, adult: Secondary | ICD-10-CM | POA: Diagnosis not present

## 2015-09-03 DIAGNOSIS — I10 Essential (primary) hypertension: Secondary | ICD-10-CM | POA: Diagnosis not present

## 2015-09-03 NOTE — Patient Instructions (Addendum)
Goals: 1. Follow Plate Method 2. Cut out Pepsi and Sweet Tea and only drink water 3. Eat three balance meals per day with 2-3 carb choices per meal. 4. Exercise 30 minutes 5 days per week. 6. Get A1C below 5.7% 7. Lose 5 lbs in next month.

## 2015-09-03 NOTE — Progress Notes (Signed)
  Medical Nutrition Therapy:  Appt start time: [id number] end time:  1630. Assessment:  Primary concerns today: Prediabetes. Lives with her husband. Most recent A1C 6.1%. On Metformin 1000 mg ER daily.  She does the cooking. Baked, broiled and grilled.  Eats out 1-2 times per week. 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 Likes to travel. Not testing blood sugars. Admits to drinking a lot of Pepsi and Sweet Tea and realizes this is her problem contributing to her weight and blood sugars. Willing to change habits and start exercising. DIet is excessive in carbs, mainly from liquid sugar from sodas and tea. Likes fresh fruits and vegetables. Needs higher fiber diet due to risks of CVD and weight loss. Tends to only eat 2 meals per day.  Preferred Learning Style:   No preference indicated   Learning Readiness:   Ready  Change in progress   MEDICATIONS: See list   DIETARY INTAKE:  24-hr recall:  B ( AM): Bowl cereal Rasin bran or Honey nut Cherrios with with Soy silk regular. OR bacon and eggs and fruit, Juice  8 oz Snk ( AM): none  L ( PM): salad with ham with vinegarette dressing, biscuit,  Sweet tea 2 glasses Snk ( PM):  D ( PM): Steak, 2 slices bread- hamburger bun with letuce and tomato, mayo,  Pepsi 12 oz  Snk ( PM):   Beverages:   Usual physical activity: ADL  Estimated energy needs: 1500 calories 170 g carbohydrates 112 g protein 42 g fat  Progress Towards Goal(s):  In progress.   Nutritional Diagnosis:  NB-1.1 Food and nutrition-related knowledge deficit As related to Prediabetes.  As evidenced by A1C 6.1%.    Intervention:  Nutrition and Diabetes education provided on My Plate, CHO counting, meal planning, portion sizes, timing of meals, avoiding snacks between meals unless having a low blood sugar, target ranges for A1C and blood sugars, signs/symptoms and treatment of hyper/hypoglycemia, monitoring blood sugars, taking medications as prescribed, benefits of exercising 30  minutes per day and prevention of complications of DM.  Goals: 1. Follow Plate Method 2. Cut out Pepsi and Sweet Tea and only drink water 3. Eat three balance meals per day with 2-3 carb choices per meal. 4. Exercise 30 minutes 5 days per week. 6. Get A1C below 5.7% 7. Lose 5 lbs in next month.  Teaching Method Utilized:  Visual Auditory Hands on  Handouts given during visit include:  The Plate Method  Meal Plan Card  Diabetes Instructions  Barriers to learning/adherence to lifestyle change: None  Demonstrated degree of understanding via:  Teach Back   Monitoring/Evaluation:  Dietary intake, exercise, meal planning, SBG, and body weight in 1 month(s).  May benefit from testing blood sugars a few times per week to see impact of food choice and exercise.

## 2015-10-23 ENCOUNTER — Other Ambulatory Visit: Payer: Self-pay | Admitting: Family Medicine

## 2015-11-15 ENCOUNTER — Other Ambulatory Visit: Payer: Self-pay | Admitting: Family Medicine

## 2015-11-15 LAB — HEMOGLOBIN A1C
HEMOGLOBIN A1C: 6 % — AB (ref <5.7)
Mean Plasma Glucose: 126 mg/dL — ABNORMAL HIGH (ref <117)

## 2015-11-16 ENCOUNTER — Encounter: Payer: Self-pay | Admitting: Family Medicine

## 2015-11-16 ENCOUNTER — Ambulatory Visit (INDEPENDENT_AMBULATORY_CARE_PROVIDER_SITE_OTHER): Payer: BC Managed Care – PPO | Admitting: Family Medicine

## 2015-11-16 VITALS — BP 120/74 | HR 85 | Resp 16 | Ht 64.0 in | Wt 190.0 lb

## 2015-11-16 DIAGNOSIS — Z0000 Encounter for general adult medical examination without abnormal findings: Secondary | ICD-10-CM | POA: Diagnosis not present

## 2015-11-16 DIAGNOSIS — F418 Other specified anxiety disorders: Secondary | ICD-10-CM | POA: Diagnosis not present

## 2015-11-16 DIAGNOSIS — Z1211 Encounter for screening for malignant neoplasm of colon: Secondary | ICD-10-CM | POA: Diagnosis not present

## 2015-11-16 DIAGNOSIS — I10 Essential (primary) hypertension: Secondary | ICD-10-CM | POA: Diagnosis not present

## 2015-11-16 LAB — LIPID PANEL
Cholesterol: 151 mg/dL (ref 125–200)
HDL: 82 mg/dL
LDL Cholesterol: 55 mg/dL
Total CHOL/HDL Ratio: 1.8 ratio
Triglycerides: 71 mg/dL
VLDL: 14 mg/dL

## 2015-11-16 LAB — BASIC METABOLIC PANEL
BUN: 15 mg/dL (ref 7–25)
CALCIUM: 9.1 mg/dL (ref 8.6–10.4)
CO2: 29 mmol/L (ref 20–31)
Chloride: 104 mmol/L (ref 98–110)
Creat: 0.87 mg/dL (ref 0.50–0.99)
GLUCOSE: 87 mg/dL (ref 65–99)
Potassium: 3.9 mmol/L (ref 3.5–5.3)
SODIUM: 140 mmol/L (ref 135–146)

## 2015-11-16 LAB — HEPATIC FUNCTION PANEL
ALK PHOS: 75 U/L (ref 33–130)
ALT: 26 U/L (ref 6–29)
AST: 22 U/L (ref 10–35)
Albumin: 4.1 g/dL (ref 3.6–5.1)
BILIRUBIN INDIRECT: 0.2 mg/dL (ref 0.2–1.2)
BILIRUBIN TOTAL: 0.3 mg/dL (ref 0.2–1.2)
Bilirubin, Direct: 0.1 mg/dL (ref <=0.2)
Total Protein: 7.4 g/dL (ref 6.1–8.1)

## 2015-11-16 LAB — POC HEMOCCULT BLD/STL (OFFICE/1-CARD/DIAGNOSTIC): FECAL OCCULT BLD: NEGATIVE

## 2015-11-16 LAB — HIV ANTIBODY (ROUTINE TESTING W REFLEX): HIV 1&2 Ab, 4th Generation: NONREACTIVE

## 2015-11-16 LAB — HEPATITIS C ANTIBODY: HCV Ab: NEGATIVE

## 2015-11-16 MED ORDER — ATENOLOL 50 MG PO TABS: 50.0000 mg | ORAL_TABLET | Freq: Every day | ORAL | Status: DC

## 2015-11-16 MED ORDER — PAROXETINE HCL 10 MG PO TABS: 10.0000 mg | ORAL_TABLET | Freq: Every day | ORAL | Status: DC

## 2015-11-16 NOTE — Assessment & Plan Note (Signed)
Uncontrolled, add paxil, continue effexor

## 2015-11-16 NOTE — Progress Notes (Signed)
   Subjective:    Patient ID: Alexis Hurst, female    DOB: 27-Apr-1951, 65 y.o.   MRN: [id number]  HPI Patient is in for annual physical exam.  Recent labs, if available are reviewed. Immunization is reviewed , and  updated if needed. Increased anxiety and stress noted, overwhelmed with mother's failing health, and care of her grandchildren. Feels she may benefit for help with anxiety, having excessive sweating episodes, and also experiences itching along anterior neck, where she sweats excessively  Review of Systems See HPI      Objective:   Physical Exam BP 120/74 mmHg  Pulse 85  Resp 16  Ht 5\' 4"  (1.626 m)  Wt 190 lb (86.183 kg)  BMI 32.60 kg/m2  SpO2 98%  Pleasant well nourished female, alert and oriented x 3, in no cardio-pulmonary distress. Afebrile. HEENT No facial trauma or asymetry. Sinuses non tender.  Extra occullar muscles intact. External ears normal, tympanic membranes clear. Oropharynx moist, no exudate, good dentition. Neck: supple, no adenopathy,JVD or thyromegaly.No bruits.  Chest: Clear to ascultation bilaterally.No crackles or wheezes. Non tender to palpation  Breast: No asymetry,no masses or lumps. No tenderness. No nipple discharge or inversion. No axillary or supraclavicular adenopathy  Cardiovascular system; Heart sounds normal,  S1 and  S2 ,no S3.  No murmur, or thrill. Apical beat not displaced Peripheral pulses normal.  Abdomen: Soft, non tender, no organomegaly or masses. No bruits. Bowel sounds normal. No guarding, tenderness or rebound.  Rectal:  Normal sphincter tone. No mass.No rectal masses.  Guaiac negative stool.  GU: External genitalia normal female genitalia , female distribution of hair. No lesions. Urethral meatus normal in size, no  Prolapse, no lesions visibly  Present. Bladder non tender. Vagina pink and moist , with no visible lesions , discharge present . Adequate pelvic support no  cystocele or rectocele  noted Uterus absent, no adnexal masses, no r adnexal tenderness.   Musculoskeletal exam: Full ROM of spine, hips , shoulders and knees. No deformity ,swelling or crepitus noted. No muscle wasting or atrophy.   Neurologic: Cranial nerves 2 to 12 intact. Power, tone ,sensation and reflexes normal throughout. No disturbance in gait. No tremor.  Skin: Intact, no ulceration, erythema , scaling or rash noted. Pigmentation normal throughout  Psych; Normal mood and affect. Judgement and concentration normal. Easily tearful, not suicidal or homicidal      Assessment & Plan:  Annual physical exam Annual exam as documented.  Changes in health habits are decided on by the patient with goals and time frames  set for achieving them. Immunization and cancer screening needs are specifically addressed at this visit.   Depression with anxiety Uncontrolled, add paxil, continue effexor

## 2015-11-16 NOTE — Patient Instructions (Addendum)
F/u in 10 weeks, call if you need me sooner  Excellent labs  Caduet will be changed to 2 individual tablets at same dose, when the rest of the blood work is in , we will call to further explain, no later than this week Thursday  Tenormin is change to 50 mg one daily  New for anxiety and mood is paxil 10 mg daily, contine effexor at current dose  Please continue to "find the time" to attend quilting, exercise and attend prediabetic classes  Each little positive thing adds up! You are a champion!  Thanks for choosing [facility], we consider it a privelige to serve you.

## 2015-11-16 NOTE — Assessment & Plan Note (Signed)
Annual exam as documented.  Changes in health habits are decided on by the patient with goals and time frames  set for achieving them. Immunization and cancer screening needs are specifically addressed at this visit.  

## 2015-11-17 ENCOUNTER — Other Ambulatory Visit: Payer: Self-pay | Admitting: Family Medicine

## 2015-11-17 ENCOUNTER — Encounter: Payer: Self-pay | Admitting: Family Medicine

## 2015-11-17 ENCOUNTER — Telehealth: Payer: Self-pay | Admitting: Family Medicine

## 2015-11-17 LAB — VITAMIN D 25 HYDROXY (VIT D DEFICIENCY, FRACTURES): VIT D 25 HYDROXY: 34 ng/mL (ref 30–100)

## 2015-11-17 NOTE — Telephone Encounter (Signed)
From pt's result, she is now started on lower dose of lipitor 20 mg daily, and the caduet is stopped due to cost. (her lab result is excellent, she ws on  caduet 5/40) Amlodipine 5 mg and lipitor 20 mg are entered historically as separate scripts. Pls notify pharmacy and pt , then fax  ?? pls ask  (This was discussed with pt at recent OV so hopefully should not be a problem )

## 2015-11-18 ENCOUNTER — Other Ambulatory Visit: Payer: Self-pay

## 2015-11-18 MED ORDER — AMLODIPINE BESYLATE 5 MG PO TABS: 5.0000 mg | ORAL_TABLET | Freq: Every day | ORAL | Status: DC

## 2015-11-18 MED ORDER — ATORVASTATIN CALCIUM 20 MG PO TABS: 20.0000 mg | ORAL_TABLET | Freq: Every day | ORAL | Status: DC

## 2015-11-18 NOTE — Telephone Encounter (Signed)
Called patient and left message for them to return call at the office   

## 2015-11-19 NOTE — Telephone Encounter (Signed)
Patient aware.

## 2015-11-22 ENCOUNTER — Other Ambulatory Visit: Payer: Self-pay | Admitting: Family Medicine

## 2015-12-09 ENCOUNTER — Ambulatory Visit: Payer: Self-pay | Admitting: Nutrition

## 2015-12-21 ENCOUNTER — Other Ambulatory Visit: Payer: Self-pay | Admitting: Family Medicine

## 2015-12-22 ENCOUNTER — Other Ambulatory Visit: Payer: Self-pay | Admitting: Family Medicine

## 2016-01-06 ENCOUNTER — Other Ambulatory Visit: Payer: Self-pay | Admitting: Family Medicine

## 2016-01-23 ENCOUNTER — Other Ambulatory Visit: Payer: Self-pay | Admitting: Family Medicine

## 2016-01-25 ENCOUNTER — Ambulatory Visit: Payer: Self-pay | Admitting: Family Medicine

## 2016-02-01 ENCOUNTER — Ambulatory Visit (INDEPENDENT_AMBULATORY_CARE_PROVIDER_SITE_OTHER): Payer: BC Managed Care – PPO | Admitting: Family Medicine

## 2016-02-01 ENCOUNTER — Encounter: Payer: Self-pay | Admitting: Family Medicine

## 2016-02-01 ENCOUNTER — Telehealth: Payer: Self-pay | Admitting: Family Medicine

## 2016-02-01 VITALS — BP 118/78 | HR 98 | Temp 99.7°F | Resp 16 | Ht 64.0 in | Wt 189.0 lb

## 2016-02-01 DIAGNOSIS — J328 Other chronic sinusitis: Secondary | ICD-10-CM

## 2016-02-01 DIAGNOSIS — J329 Chronic sinusitis, unspecified: Secondary | ICD-10-CM | POA: Insufficient documentation

## 2016-02-01 DIAGNOSIS — E669 Obesity, unspecified: Secondary | ICD-10-CM

## 2016-02-01 DIAGNOSIS — E785 Hyperlipidemia, unspecified: Secondary | ICD-10-CM | POA: Diagnosis not present

## 2016-02-01 DIAGNOSIS — R7303 Prediabetes: Secondary | ICD-10-CM

## 2016-02-01 DIAGNOSIS — I10 Essential (primary) hypertension: Secondary | ICD-10-CM

## 2016-02-01 DIAGNOSIS — E66811 Obesity, class 1: Secondary | ICD-10-CM

## 2016-02-01 DIAGNOSIS — F418 Other specified anxiety disorders: Secondary | ICD-10-CM

## 2016-02-01 MED ORDER — METFORMIN HCL 1000 MG PO TABS: 1000.0000 mg | ORAL_TABLET | Freq: Every day | ORAL | Status: DC

## 2016-02-01 MED ORDER — CEFTRIAXONE SODIUM 1 G IJ SOLR
500.0000 mg | Freq: Once | INTRAMUSCULAR | Status: AC
  Administered 2016-02-01: 500 mg via INTRAMUSCULAR

## 2016-02-01 MED ORDER — PENICILLIN V POTASSIUM 500 MG PO TABS: 500.0000 mg | ORAL_TABLET | Freq: Three times a day (TID) | ORAL | Status: DC

## 2016-02-01 NOTE — Patient Instructions (Addendum)
F/u in  3 month, call if you need me before  Fasting lipid, cmp and eGFr and hBA1C in Septemeber  You are treated for sinusitis , 2 weeks of penicillin and  Rocephin administered. Call for further testing if not 100% better on completion of medication  Thank you  for choosing [facility]. We consider it a privelige to serve you.  Delivering excellent health care in a caring and  compassionate way is our goal.  Partnering with you,  so that together we can achieve this goal is our strategy.

## 2016-02-01 NOTE — Telephone Encounter (Signed)
Sinus congestion and coughing up yellow mucus x 3 days, no fever, some headache. Wants something called in if she cannot be seen. Please advise

## 2016-02-01 NOTE — Telephone Encounter (Signed)
Worked in today at 2

## 2016-02-01 NOTE — Progress Notes (Signed)
Subjective:    Patient ID: Alexis Hurst, female    DOB: May 29, 1951, 65 y.o.   MRN: [id number]  HPI 3 week h/o worsening head congestion and brown post nasal drainage, associated with fever and chills intermittently.Has been stressed with Mom's declining healh and no time to take care of herself Nasal drainage has thickened , and is yellowish green, and at times bloody. Excess cough generally non productive, worse in supine position A lot of sick contact with family members. C/o bilateral ear pressure, denies hearing loss and sore throat. Increasing fatigue , poor appetitie and sleep disturbed by cough. No improvement with OTC medication.    Review of Systems    See HPI . Denies chest pains, palpitations and leg swelling Denies abdominal pain, nausea, vomiting,diarrhea or constipation.   Denies dysuria, frequency, hesitancy or incontinence. Denies joint pain, swelling and limitation in mobility. Denies , seizures, numbness, or tingling. Denies uncontrolled depression, anxiety or insomnia. Denies skin break down or rash.      Objective:   Physical Exam  BP 118/78 mmHg  Pulse 98  Temp(Src) 99.7 F (37.6 C) (Oral)  Resp 16  Ht 5\' 4"  (1.626 m)  Wt 189 lb (85.73 kg)  BMI 32.43 kg/m2  SpO2 99% Patient alert and oriented and in no cardiopulmonary distress.  HEENT: No facial asymmetry, EOMI,   oropharynx pink and moist.  Neck supple no JVD, no mass. Maxillary sinus tenderness, TM clear, bilateral ant  Cervical adenitis Chest: Clear to auscultation bilaterally.  CVS: S1, S2 no murmurs, no S3.Regular rate.  ABD: Soft non tender.   Ext: No edema  MS: Adequate ROM spine, shoulders, hips and knees.  Skin: Intact, no ulcerations or rash noted.  Psych: Good eye contact, normal affect. Memory intact not anxious or depressed appearing.  CNS: CN 2-12 intact, power,  normal throughout.no focal deficits noted.       Assessment & Plan:  Sinusitis, chronic 3 week h/o  worsening symptoms, 2 week course of antibiotic prescribed, will need inmaging and testing if symptoms do not completely resolve, pt education done  Essential hypertension Controlled, no change in medication DASH diet and commitment to daily physical activity for a minimum of 30 minutes discussed and encouraged, as a part of hypertension management. The importance of attaining a healthy weight is also discussed.  BP/Weight 02/01/2016 11/16/2015 09/03/2015 07/20/2015 04/22/2015 [id number]  Systolic BP 123456 123456 - [id number] 123456 123456 123456  Diastolic BP 78 74 - 60 70 80 74  Wt. (Lbs) 189 190 195 193 187.12 189 189  BMI 32.43 32.6 33.46 33.11 32.1 32.43 32.43        Prediabetes Updated lab needed at/ before next visit.  Patient educated about the importance of limiting  Carbohydrate intake , the need to commit to daily physical activity for a minimum of 30 minutes , and to commit weight loss. The fact that changes in all these areas will reduce or eliminate all together the development of diabetes is stressed.   Diabetic Labs Latest Ref Rng 11/15/2015 07/19/2015 02/15/2015 10/02/2014 08/24/2014  HbA1c <5.7 % 6.0(H) 6.1(H) 6.0(H) 6.0(H) 5.9(H)  Chol 125 - 200 mg/dL 151 - 160 - 128  HDL >=46 mg/dL 82 - 86 - 57  Calc LDL <130 mg/dL 55 - 60 - 58  Triglycerides <150 mg/dL 71 - 69 - 65  Creatinine 0.50 - 0.99 mg/dL 0.87 0.77 0.73 0.74 0.75   BP/Weight 02/01/2016 11/16/2015 09/03/2015 07/20/2015 04/22/2015 [id number]  Systolic BP 123456  120 - 114 123456 123456 123456  Diastolic BP 78 74 - 60 70 80 74  Wt. (Lbs) 189 190 195 193 187.12 189 189  BMI 32.43 32.6 33.46 33.11 32.1 32.43 32.43   No flowsheet data found.     Depression with anxiety Improved, patient was intolerant of paxil , discontinued the med after 2 days, score of zero today on effexor only Mother now in hospice, excellent family support  Hyperlipidemia LDL goal <100 Hyperlipidemia:Low fat diet discussed and encouraged.   Lipid  Panel  Lab Results  Component Value Date   CHOL 151 11/15/2015   HDL 82 11/15/2015   LDLCALC 55 11/15/2015   TRIG 71 11/15/2015   CHOLHDL 1.8 11/15/2015   Controlled, no change in medication      Obesity (BMI 30.0-34.9) Unchanged Patient re-educated about  the importance of commitment to a  minimum of 150 minutes of exercise per week.  The importance of healthy food choices with portion control discussed. Encouraged to start a food diary, count calories and to consider  joining a support group. Sample diet sheets offered. Goals set by the patient for the next several months.   Weight /BMI 02/01/2016 11/16/2015 09/03/2015  WEIGHT 189 lb 190 lb 195 lb  HEIGHT 5\' 4"  5\' 4"  5\' 4"   BMI 32.43 kg/m2 32.6 kg/m2 33.46 kg/m2    Current exercise per week 90 min

## 2016-02-01 NOTE — Telephone Encounter (Signed)
Alexis Hurst is complaining of sinus congestion , coughing up mucos that is colored, headache, symptoms x's 3 days, denies fever, please advise?

## 2016-02-01 NOTE — Telephone Encounter (Signed)
Work in today [pls 

## 2016-02-02 NOTE — Assessment & Plan Note (Signed)
Unchanged Patient re-educated about  the importance of commitment to a  minimum of 150 minutes of exercise per week.  The importance of healthy food choices with portion control discussed. Encouraged to start a food diary, count calories and to consider  joining a support group. Sample diet sheets offered. Goals set by the patient for the next several months.   Weight /BMI 02/01/2016 11/16/2015 09/03/2015  WEIGHT 189 lb 190 lb 195 lb  HEIGHT 5\' 4"  5\' 4"  5\' 4"   BMI 32.43 kg/m2 32.6 kg/m2 33.46 kg/m2    Current exercise per week 90 min

## 2016-02-02 NOTE — Assessment & Plan Note (Signed)
Improved, patient was intolerant of paxil , discontinued the med after 2 days, score of zero today on effexor only Mother now in hospice, excellent family support

## 2016-02-02 NOTE — Assessment & Plan Note (Signed)
3 week h/o worsening symptoms, 2 week course of antibiotic prescribed, will need inmaging and testing if symptoms do not completely resolve, pt education done

## 2016-02-02 NOTE — Assessment & Plan Note (Signed)
Hyperlipidemia:Low fat diet discussed and encouraged.   Lipid Panel  Lab Results  Component Value Date   CHOL 151 11/15/2015   HDL 82 11/15/2015   LDLCALC 55 11/15/2015   TRIG 71 11/15/2015   CHOLHDL 1.8 11/15/2015   Controlled, no change in medication

## 2016-02-02 NOTE — Assessment & Plan Note (Signed)
Controlled, no change in medication DASH diet and commitment to daily physical activity for a minimum of 30 minutes discussed and encouraged, as a part of hypertension management. The importance of attaining a healthy weight is also discussed.  BP/Weight 02/01/2016 11/16/2015 09/03/2015 07/20/2015 04/22/2015 [id number]  Systolic BP 123456 123456 - [id number] 123456 123456 123456  Diastolic BP 78 74 - 60 70 80 74  Wt. (Lbs) 189 190 195 193 187.12 189 189  BMI 32.43 32.6 33.46 33.11 32.1 32.43 32.43

## 2016-02-02 NOTE — Assessment & Plan Note (Signed)
Updated lab needed at/ before next visit.  Patient educated about the importance of limiting  Carbohydrate intake , the need to commit to daily physical activity for a minimum of 30 minutes , and to commit weight loss. The fact that changes in all these areas will reduce or eliminate all together the development of diabetes is stressed.   Diabetic Labs Latest Ref Rng 11/15/2015 07/19/2015 02/15/2015 10/02/2014 08/24/2014  HbA1c <5.7 % 6.0(H) 6.1(H) 6.0(H) 6.0(H) 5.9(H)  Chol 125 - 200 mg/dL 151 - 160 - 128  HDL >=46 mg/dL 82 - 86 - 57  Calc LDL <130 mg/dL 55 - 60 - 58  Triglycerides <150 mg/dL 71 - 69 - 65  Creatinine 0.50 - 0.99 mg/dL 0.87 0.77 0.73 0.74 0.75   BP/Weight 02/01/2016 11/16/2015 09/03/2015 07/20/2015 04/22/2015 [id number]  Systolic BP 123456 123456 - [id number] 123456 123456 123456  Diastolic BP 78 74 - 60 70 80 74  Wt. (Lbs) 189 190 195 193 187.12 189 189  BMI 32.43 32.6 33.46 33.11 32.1 32.43 32.43   No flowsheet data found.

## 2016-02-20 ENCOUNTER — Other Ambulatory Visit: Payer: Self-pay | Admitting: Family Medicine

## 2016-05-23 ENCOUNTER — Encounter: Payer: Self-pay | Admitting: Family Medicine

## 2016-05-23 ENCOUNTER — Ambulatory Visit (INDEPENDENT_AMBULATORY_CARE_PROVIDER_SITE_OTHER): Payer: BC Managed Care – PPO | Admitting: Family Medicine

## 2016-05-23 VITALS — BP 120/72 | HR 92 | Resp 16 | Ht 64.0 in | Wt 196.0 lb

## 2016-05-23 DIAGNOSIS — E785 Hyperlipidemia, unspecified: Secondary | ICD-10-CM | POA: Diagnosis not present

## 2016-05-23 DIAGNOSIS — R7303 Prediabetes: Secondary | ICD-10-CM | POA: Diagnosis not present

## 2016-05-23 DIAGNOSIS — Z23 Encounter for immunization: Secondary | ICD-10-CM | POA: Diagnosis not present

## 2016-05-23 DIAGNOSIS — I10 Essential (primary) hypertension: Secondary | ICD-10-CM

## 2016-05-23 DIAGNOSIS — F418 Other specified anxiety disorders: Secondary | ICD-10-CM

## 2016-05-23 DIAGNOSIS — R1310 Dysphagia, unspecified: Secondary | ICD-10-CM

## 2016-05-23 DIAGNOSIS — M2651 Abnormal jaw closure: Secondary | ICD-10-CM

## 2016-05-23 DIAGNOSIS — E669 Obesity, unspecified: Secondary | ICD-10-CM

## 2016-05-23 NOTE — Patient Instructions (Signed)
F/u nin 4 month, call if you neeed me sooner  Flu vaccine today  Call if sore area in throat persisits, you will need to see GI doctor  You are being referred to specialist re recurrent right jaw dislocation  Thank you  for choosing [facility]. We consider it a privelige to serve you.  Delivering excellent health care in a caring and  compassionate way is our goal.  Partnering with you,  so that together we can achieve this goal is our strategy.   Please work on good  health habits so that your health will improve. 1. Commitment to daily physical activity for 30 to 60  minutes, if you are able to do this.  2. Commitment to wise food choices. Aim for half of your  food intake to be vegetable and fruit, one quarter starchy foods, and one quarter protein. Try to eat on a regular schedule  3 meals per day, snacking between meals should be limited to vegetables or fruits or small portions of nuts. 64 ounces of water per day is generally recommended, unless you have specific health conditions, like heart failure or kidney failure where you will need to limit fluid intake.  3. Commitment to sufficient and a  good quality of physical and mental rest daily, generally between 6 to 8 hours per day.  WITH PERSISTANCE AND PERSEVERANCE, THE IMPOSSIBLE , BECOMES THE NORM! Fasting labs are due

## 2016-05-24 ENCOUNTER — Encounter: Payer: Self-pay | Admitting: Family Medicine

## 2016-05-24 LAB — COMPLETE METABOLIC PANEL WITH GFR
ALBUMIN: 3.8 g/dL (ref 3.6–5.1)
ALK PHOS: 80 U/L (ref 33–130)
ALT: 22 U/L (ref 6–29)
AST: 22 U/L (ref 10–35)
BILIRUBIN TOTAL: 0.4 mg/dL (ref 0.2–1.2)
BUN: 10 mg/dL (ref 7–25)
CO2: 28 mmol/L (ref 20–31)
Calcium: 9.1 mg/dL (ref 8.6–10.4)
Chloride: 105 mmol/L (ref 98–110)
Creat: 0.81 mg/dL (ref 0.50–0.99)
GFR, EST NON AFRICAN AMERICAN: 78 mL/min (ref >=60)
GFR, Est African American: >89 mL/min (ref >=60)
GLUCOSE: 95 mg/dL (ref 65–99)
Potassium: 4 mmol/L (ref 3.5–5.3)
SODIUM: 142 mmol/L (ref 135–146)
TOTAL PROTEIN: 6.8 g/dL (ref 6.1–8.1)

## 2016-05-24 LAB — HEMOGLOBIN A1C
Hgb A1c MFr Bld: 5.8 % — ABNORMAL HIGH (ref <5.7)
MEAN PLASMA GLUCOSE: 120 mg/dL

## 2016-05-24 LAB — LIPID PANEL
CHOL/HDL RATIO: 1.9 ratio (ref <=5.0)
Cholesterol: 143 mg/dL (ref 125–200)
HDL: 77 mg/dL (ref >=46)
LDL CALC: 49 mg/dL (ref <130)
Triglycerides: 87 mg/dL (ref <150)
VLDL: 17 mg/dL (ref <30)

## 2016-05-26 DIAGNOSIS — M2651 Abnormal jaw closure: Secondary | ICD-10-CM | POA: Insufficient documentation

## 2016-05-26 DIAGNOSIS — R1310 Dysphagia, unspecified: Secondary | ICD-10-CM | POA: Insufficient documentation

## 2016-05-26 NOTE — Assessment & Plan Note (Signed)
Patient educated about the importance of limiting  Carbohydrate intake , the need to commit to daily physical activity for a minimum of 30 minutes , and to commit weight loss. The fact that changes in all these areas will reduce or eliminate all together the development of diabetes is stressed.  Improved  Diabetic Labs Latest Ref Rng & Units 05/23/2016 11/15/2015 07/19/2015 02/15/2015 10/02/2014  HbA1c <5.7 % 5.8(H) 6.0(H) 6.1(H) 6.0(H) 6.0(H)  Chol 125 - 200 mg/dL 143 151 - 160 -  HDL >=46 mg/dL 77 82 - 86 -  Calc LDL <130 mg/dL 49 55 - 60 -  Triglycerides <150 mg/dL 87 71 - 69 -  Creatinine 0.50 - 0.99 mg/dL 0.81 0.87 0.77 0.73 0.74   BP/Weight 05/23/2016 02/01/2016 11/16/2015 09/03/2015 07/20/2015 04/22/2015 [id number]  Systolic BP 123456 123456 123456 - [id number] 123456 123456  Diastolic BP 72 78 74 - 60 70 80  Wt. (Lbs) 196 189 190 195 193 187.12 189  BMI 33.64 32.43 32.6 33.46 33.11 32.1 32.43   No flowsheet data found.

## 2016-05-26 NOTE — Assessment & Plan Note (Signed)
Aggravated when pill got stuck when pt was swallowing, will gargle with salt water and use tylenol as needed for pain, call for GI referral if remains a problem after 2 weeks

## 2016-05-26 NOTE — Assessment & Plan Note (Signed)
1 month h/o abnormal jaw closure on right, no specific aggravating factor noted. States feels as though though right jaw not closing proplerly , as if it will "lock" No reproducible jaw pain at TMJ, pt denies pain also, refer to roal surgeon to eval and manage

## 2016-05-26 NOTE — Progress Notes (Signed)
Alexis Hurst     MRN: [id number]      DOB: [birth year]   HPI Alexis Hurst is here for follow up and re-evaluation of chronic medical conditions, medication management and review of any available recent lab and radiology data.  Preventive health is updated, specifically  Cancer screening and Immunization.   Questions or concerns regarding consultations or procedures which the PT has had in the interim are  addressed. The PT denies any adverse reactions to current medications since the last visit.  C/o recurrent right jaw dislocation when doing something as simple as chewing solid food is occurring, started approx 1 month ago. Denies jaw pain, ant it has never "locked" C/o sore area in throat which started after pill she was swallowing seems to have got stuck, slightly improved since original insult , wants to watch further  ROS Denies recent fever or chills. Denies sinus pressure, nasal congestion, ear pain or sore throat. Denies chest congestion, productive cough or wheezing. Denies chest pains, palpitations and leg swelling Denies abdominal pain, nausea, vomiting,diarrhea or constipation.   Denies dysuria, frequency, hesitancy or incontinence.  Denies headaches, seizures, numbness, or tingling. Denies uncontrolled  depression, anxiety or insomnia.Does c/o ongoing stress, everybody needs her, no time for herself Denies skin break down or rash.   PE  BP 120/72   Pulse 92   Resp 16   Ht 5\' 4"  (1.626 m)   Wt 196 lb (88.9 kg)   SpO2 96%   BMI 33.64 kg/m   Patient alert and oriented and in no cardiopulmonary distress.  HEENT: No facial asymmetry, EOMI,   oropharynx pink and moist.  Neck supple no JVD, no mass.No tenderness over TMJ on right side, no cervical adenopathy  Chest: Clear to auscultation bilaterally.  CVS: S1, S2 no murmurs, no S3.Regular rate.  ABD: Soft non tender.   Ext: No edema  MS: Adequate ROM spine, shoulders, hips and knees.  Skin: Intact, no  ulcerations or rash noted.  Psych: Good eye contact, normal affect. Memory intact not anxious or depressed appearing.  CNS: CN 2-12 intact, power,  normal throughout.no focal deficits noted.   Assessment & Plan  Essential hypertension Controlled, no change in medication DASH diet and commitment to daily physical activity for a minimum of 30 minutes discussed and encouraged, as a part of hypertension management. The importance of attaining a healthy weight is also discussed.  BP/Weight 05/23/2016 02/01/2016 11/16/2015 09/03/2015 07/20/2015 04/22/2015 [id number]  Systolic BP 123456 123456 123456 - [id number] 123456 123456  Diastolic BP 72 78 74 - 60 70 80  Wt. (Lbs) 196 189 190 195 193 187.12 189  BMI 33.64 32.43 32.6 33.46 33.11 32.1 32.43       Hyperlipidemia LDL goal <100 Hyperlipidemia:Low fat diet discussed and encouraged.   Lipid Panel  Lab Results  Component Value Date   CHOL 143 05/23/2016   HDL 77 05/23/2016   LDLCALC 49 05/23/2016   TRIG 87 05/23/2016   CHOLHDL 1.9 05/23/2016   Controlled, no change in medication     Prediabetes Patient educated about the importance of limiting  Carbohydrate intake , the need to commit to daily physical activity for a minimum of 30 minutes , and to commit weight loss. The fact that changes in all these areas will reduce or eliminate all together the development of diabetes is stressed.  Improved  Diabetic Labs Latest Ref Rng & Units 05/23/2016 11/15/2015 07/19/2015 02/15/2015 10/02/2014  HbA1c <5.7 % 5.8(H) 6.0(H) 6.1(H) 6.0(H) 6.0(H)  Chol 125 - 200 mg/dL 143 151 - 160 -  HDL >=46 mg/dL 77 82 - 86 -  Calc LDL <130 mg/dL 49 55 - 60 -  Triglycerides <150 mg/dL 87 71 - 69 -  Creatinine 0.50 - 0.99 mg/dL 0.81 0.87 0.77 0.73 0.74   BP/Weight 05/23/2016 02/01/2016 11/16/2015 09/03/2015 07/20/2015 04/22/2015 [id number]  Systolic BP 123456 123456 123456 - [id number] 123456 123456  Diastolic BP 72 78 74 - 60 70 80  Wt. (Lbs) 196 189 190 195 193 187.12 189  BMI 33.64 32.43 32.6  33.46 33.11 32.1 32.43   No flowsheet data found.    Depression with anxiety Stable and controlled on current meds, continue same  Obesity (BMI 30.0-34.9) Deteriorated. Patient re-educated about  the importance of commitment to a  minimum of 150 minutes of exercise per week.  The importance of healthy food choices with portion control discussed. Encouraged to start a food diary, count calories and to consider  joining a support group. Sample diet sheets offered. Goals set by the patient for the next several months.   Weight /BMI 05/23/2016 02/01/2016 11/16/2015  WEIGHT 196 lb 189 lb 190 lb  HEIGHT 5\' 4"  5\' 4"  5\' 4"   BMI 33.64 kg/m2 32.43 kg/m2 32.6 kg/m2      Painful swallowing Aggravated when pill got stuck when pt was swallowing, will gargle with salt water and use tylenol as needed for pain, call for GI referral if remains a problem after 2 weeks  Jaw closure abnormality 1 month h/o abnormal jaw closure on right, no specific aggravating factor noted. States feels as though though right jaw not closing proplerly , as if it will "lock" No reproducible jaw pain at TMJ, pt denies pain also, refer to roal surgeon to eval and manage

## 2016-05-26 NOTE — Assessment & Plan Note (Signed)
Deteriorated. Patient re-educated about  the importance of commitment to a  minimum of 150 minutes of exercise per week.  The importance of healthy food choices with portion control discussed. Encouraged to start a food diary, count calories and to consider  joining a support group. Sample diet sheets offered. Goals set by the patient for the next several months.   Weight /BMI 05/23/2016 02/01/2016 11/16/2015  WEIGHT 196 lb 189 lb 190 lb  HEIGHT 5\' 4"  5\' 4"  5\' 4"   BMI 33.64 kg/m2 32.43 kg/m2 32.6 kg/m2

## 2016-05-26 NOTE — Assessment & Plan Note (Signed)
Controlled, no change in medication DASH diet and commitment to daily physical activity for a minimum of 30 minutes discussed and encouraged, as a part of hypertension management. The importance of attaining a healthy weight is also discussed.  BP/Weight 05/23/2016 02/01/2016 11/16/2015 09/03/2015 07/20/2015 04/22/2015 [id number]  Systolic BP 123456 123456 123456 - [id number] 123456 123456  Diastolic BP 72 78 74 - 60 70 80  Wt. (Lbs) 196 189 190 195 193 187.12 189  BMI 33.64 32.43 32.6 33.46 33.11 32.1 32.43

## 2016-05-26 NOTE — Assessment & Plan Note (Signed)
Hyperlipidemia:Low fat diet discussed and encouraged.   Lipid Panel  Lab Results  Component Value Date   CHOL 143 05/23/2016   HDL 77 05/23/2016   LDLCALC 49 05/23/2016   TRIG 87 05/23/2016   CHOLHDL 1.9 05/23/2016   Controlled, no change in medication

## 2016-05-26 NOTE — Assessment & Plan Note (Signed)
Stable and controlled on current meds, continue same 

## 2016-06-01 ENCOUNTER — Ambulatory Visit: Payer: Self-pay | Admitting: Family Medicine

## 2016-06-20 ENCOUNTER — Other Ambulatory Visit: Payer: Self-pay | Admitting: Family Medicine

## 2016-06-22 ENCOUNTER — Other Ambulatory Visit: Payer: Self-pay | Admitting: Family Medicine

## 2016-06-22 ENCOUNTER — Telehealth: Payer: Self-pay

## 2016-06-22 NOTE — Telephone Encounter (Signed)
Coreg to be substituted, entered historically and atenalol removed, needs nurse BP check 4 weeks after starting coreg please pls explain to pt , send and notify pharmacy, thanks

## 2016-06-24 MED ORDER — CARVEDILOL 6.25 MG PO TABS: 6.2500 mg | ORAL_TABLET | Freq: Two times a day (BID) | ORAL | 3 refills | Status: DC

## 2016-06-24 NOTE — Telephone Encounter (Signed)
Called pharmacy and d'ced Atenolol.  Coreg sent in electronically.  Called and left message for patient notifying of change.  Requested that she call office back on 10/23

## 2016-06-24 NOTE — Addendum Note (Signed)
Addended by: Denman George B on: 06/24/2016 09:48 AM   Modules accepted: Orders

## 2016-07-05 ENCOUNTER — Other Ambulatory Visit: Payer: Self-pay | Admitting: Family Medicine

## 2016-07-05 DIAGNOSIS — Z1231 Encounter for screening mammogram for malignant neoplasm of breast: Secondary | ICD-10-CM

## 2016-07-05 LAB — HM DIABETES EYE EXAM

## 2016-07-06 ENCOUNTER — Inpatient Hospital Stay (HOSPITAL_COMMUNITY): Admission: RE | Admit: 2016-07-06 | Payer: Self-pay | Source: Ambulatory Visit

## 2016-07-19 ENCOUNTER — Other Ambulatory Visit: Payer: Self-pay | Admitting: Family Medicine

## 2016-07-19 ENCOUNTER — Ambulatory Visit (HOSPITAL_COMMUNITY)
Admission: RE | Admit: 2016-07-19 | Discharge: 2016-07-19 | Disposition: A | Discharge status: Home / Self Care | Payer: BC Managed Care – PPO | Source: Ambulatory Visit | Attending: Family Medicine | Admitting: Family Medicine

## 2016-07-19 ENCOUNTER — Encounter (HOSPITAL_COMMUNITY): Payer: Self-pay

## 2016-07-19 DIAGNOSIS — Z1231 Encounter for screening mammogram for malignant neoplasm of breast: Secondary | ICD-10-CM

## 2016-07-25 ENCOUNTER — Other Ambulatory Visit: Payer: Self-pay | Admitting: Family Medicine

## 2016-08-01 ENCOUNTER — Other Ambulatory Visit: Payer: Self-pay | Admitting: Family Medicine

## 2016-08-14 ENCOUNTER — Other Ambulatory Visit: Payer: Self-pay | Admitting: Family Medicine

## 2016-08-14 DIAGNOSIS — I10 Essential (primary) hypertension: Secondary | ICD-10-CM

## 2016-08-20 ENCOUNTER — Other Ambulatory Visit: Payer: Self-pay | Admitting: Family Medicine

## 2016-08-25 ENCOUNTER — Other Ambulatory Visit: Payer: Self-pay

## 2016-08-25 MED ORDER — ATORVASTATIN CALCIUM 20 MG PO TABS: 20.0000 mg | ORAL_TABLET | Freq: Every day | ORAL | 3 refills | Status: DC

## 2016-09-24 ENCOUNTER — Other Ambulatory Visit: Payer: Self-pay | Admitting: Family Medicine

## 2016-09-26 LAB — COMPLETE METABOLIC PANEL WITH GFR
ALBUMIN: 3.7 g/dL (ref 3.6–5.1)
ALK PHOS: 90 U/L (ref 33–130)
ALT: 36 U/L — AB (ref 6–29)
AST: 32 U/L (ref 10–35)
BUN: 8 mg/dL (ref 7–25)
CO2: 31 mmol/L (ref 20–31)
Calcium: 9.2 mg/dL (ref 8.6–10.4)
Chloride: 103 mmol/L (ref 98–110)
Creat: 0.73 mg/dL (ref 0.50–0.99)
GFR, EST NON AFRICAN AMERICAN: 88 mL/min (ref >=60)
GFR, Est African American: >89 mL/min (ref >=60)
GLUCOSE: 85 mg/dL (ref 65–99)
Potassium: 3.7 mmol/L (ref 3.5–5.3)
SODIUM: 139 mmol/L (ref 135–146)
Total Bilirubin: 0.4 mg/dL (ref 0.2–1.2)
Total Protein: 7.1 g/dL (ref 6.1–8.1)

## 2016-09-26 LAB — LIPID PANEL
CHOLESTEROL: 140 mg/dL (ref <200)
HDL: 70 mg/dL (ref >50)
LDL Cholesterol: 57 mg/dL (ref <100)
TRIGLYCERIDES: 64 mg/dL (ref <150)
Total CHOL/HDL Ratio: 2 Ratio (ref <5.0)
VLDL: 13 mg/dL (ref <30)

## 2016-09-26 LAB — HEMOGLOBIN A1C
Hgb A1c MFr Bld: 5.7 % — ABNORMAL HIGH (ref <5.7)
Mean Plasma Glucose: 117 mg/dL

## 2016-09-27 ENCOUNTER — Ambulatory Visit (INDEPENDENT_AMBULATORY_CARE_PROVIDER_SITE_OTHER): Payer: BC Managed Care – PPO | Admitting: Family Medicine

## 2016-09-27 ENCOUNTER — Other Ambulatory Visit: Payer: Self-pay

## 2016-09-27 ENCOUNTER — Encounter: Payer: Self-pay | Admitting: Family Medicine

## 2016-09-27 VITALS — BP 120/76 | HR 115 | Resp 16 | Ht 64.0 in | Wt 200.0 lb

## 2016-09-27 DIAGNOSIS — R7303 Prediabetes: Secondary | ICD-10-CM

## 2016-09-27 DIAGNOSIS — E669 Obesity, unspecified: Secondary | ICD-10-CM | POA: Diagnosis not present

## 2016-09-27 DIAGNOSIS — F418 Other specified anxiety disorders: Secondary | ICD-10-CM

## 2016-09-27 DIAGNOSIS — R232 Flushing: Secondary | ICD-10-CM

## 2016-09-27 DIAGNOSIS — E8881 Metabolic syndrome: Secondary | ICD-10-CM

## 2016-09-27 DIAGNOSIS — I10 Essential (primary) hypertension: Secondary | ICD-10-CM | POA: Diagnosis not present

## 2016-09-27 DIAGNOSIS — E66811 Obesity, class 1: Secondary | ICD-10-CM

## 2016-09-27 DIAGNOSIS — R002 Palpitations: Secondary | ICD-10-CM

## 2016-09-27 MED ORDER — FLUOXETINE HCL 10 MG PO TABS: 10.0000 mg | ORAL_TABLET | Freq: Every day | ORAL | 4 refills | Status: DC

## 2016-09-27 NOTE — Patient Instructions (Addendum)
F/u in 2 months, call if you need me before  You are referred to cardiology for evalaution of palpitations and fatigue  Excellent labs  New additional medication for hot flashes  PLEASE follow through on your new 'Me time"

## 2016-09-27 NOTE — Assessment & Plan Note (Signed)
Controlled, no change in medication DASH diet and commitment to daily physical activity for a minimum of 30 minutes discussed and encouraged, as a part of hypertension management. The importance of attaining a healthy weight is also discussed.  BP/Weight 09/27/2016 05/23/2016 02/01/2016 11/16/2015 09/03/2015 07/20/2015 [id number]  Systolic BP 123456 123456 123456 123456 - [id number] 123456  Diastolic BP 76 72 78 74 - 60 70  Wt. (Lbs) 200 196 189 190 195 193 187.12  BMI 34.33 33.64 32.43 32.6 33.46 33.11 32.1

## 2016-09-27 NOTE — Assessment & Plan Note (Signed)
Deteriorated. Patient re-educated about  the importance of commitment to a  minimum of 150 minutes of exercise per week.  The importance of healthy food choices with portion control discussed. Encouraged to start a food diary, count calories and to consider  joining a support group. Sample diet sheets offered. Goals set by the patient for the next several months.   Weight /BMI 09/27/2016 05/23/2016 02/01/2016  WEIGHT 200 lb 196 lb 189 lb  HEIGHT 5\' 4"  5\' 4"  5\' 4"   BMI 34.33 kg/m2 33.64 kg/m2 32.43 kg/m2   Needs to commit to exercise

## 2016-09-27 NOTE — Progress Notes (Signed)
ERIAH CHAM     MRN: [id number]      DOB: 07-08-1951   HPI Alexis Hurst is here for follow up and re-evaluation of chronic medical conditions, medication management and review of any available recent lab and radiology data.  Preventive health is updated, specifically  Cancer screening and Immunization.   C/o chronic fatigue, wants "me time" to disappear for 1 week and have to report to no one, feels over burdened with caring for others, has got additional help with more time for herself but not using as she wants to, no motivation or energy. Not suicidal or homicidal C/o intermittent palpitations unprovoked for along time, and increased fatigue. C/o hot flashes despite effexor Had acute gE last weekend with ledft posterior chest pain, now pain and symptom free  ROS See HPI Denies recent fever or chills. Denies sinus pressure, nasal congestion, ear pain or sore throat. Denies chest congestion, productive cough or wheezing. Denies chest pains, PND, orthopnea and leg swelling    Denies dysuria, frequency, hesitancy or incontinence. Denies joint pain, swelling and limitation in mobility. Denies headaches, seizures, numbness, or tingling. . Denies skin break down or rash.   PE  BP 120/76   Pulse (!) 115   Resp 16   Ht 5\' 4"  (1.626 m)   Wt 200 lb (90.7 kg)   SpO2 96%   BMI 34.33 kg/m   Patient alert and oriented and in no cardiopulmonary distress.Appears fatigued  HEENT: No facial asymmetry, EOMI,   oropharynx pink and moist.  Neck supple no JVD, no mass.  Chest: Clear to auscultation bilaterally.No reproducible chest wall pain EKG: NSR, rate of 94 no ischemia , no LVH  CVS: S1, S2 no murmurs, no S3.Regular rate.  ABD: Soft non tender.   Ext: No edema  MS: Adequate ROM spine, shoulders, hips and knees.  Skin: Intact, no ulcerations or rash noted.  Psych: Good eye contact, flat affect. Memory intact not anxious or depressed appearing.  CNS: CN 2-12 intact, power,   normal throughout.no focal deficits noted.   Assessment & Plan  Essential hypertension Controlled, no change in medication DASH diet and commitment to daily physical activity for a minimum of 30 minutes discussed and encouraged, as a part of hypertension management. The importance of attaining a healthy weight is also discussed.  BP/Weight 09/27/2016 05/23/2016 02/01/2016 11/16/2015 09/03/2015 07/20/2015 [id number]  Systolic BP 123456 123456 123456 123456 - [id number] 123456  Diastolic BP 76 72 78 74 - 60 70  Wt. (Lbs) 200 196 189 190 195 193 187.12  BMI 34.33 33.64 32.43 32.6 33.46 33.11 32.1       Obesity (BMI 30.0-34.9) Deteriorated. Patient re-educated about  the importance of commitment to a  minimum of 150 minutes of exercise per week.  The importance of healthy food choices with portion control discussed. Encouraged to start a food diary, count calories and to consider  joining a support group. Sample diet sheets offered. Goals set by the patient for the next several months.   Weight /BMI 09/27/2016 05/23/2016 02/01/2016  WEIGHT 200 lb 196 lb 189 lb  HEIGHT 5\' 4"  5\' 4"  5\' 4"   BMI 34.33 kg/m2 33.64 kg/m2 32.43 kg/m2   Needs to commit to exercise  Prediabetes Patient educated about the importance of limiting  Carbohydrate intake , the need to commit to daily physical activity for a minimum of 30 minutes , and to commit weight loss. The fact that changes in all these areas will reduce or eliminate  all together the development of diabetes is stressed.   Diabetic Labs Latest Ref Rng & Units 09/25/2016 05/23/2016 11/15/2015 07/19/2015 02/15/2015  HbA1c <5.7 % 5.7(H) 5.8(H) 6.0(H) 6.1(H) 6.0(H)  Chol <200 mg/dL 140 143 151 - 160  HDL >50 mg/dL 70 77 82 - 86  Calc LDL <100 mg/dL 57 49 55 - 60  Triglycerides <150 mg/dL 64 87 71 - 69  Creatinine 0.50 - 0.99 mg/dL 0.73 0.81 0.87 0.77 0.73   BP/Weight 09/27/2016 05/23/2016 02/01/2016 11/16/2015 09/03/2015 07/20/2015 [id number]  Systolic BP 123456 123456 123456 123456 - 99991111  123456  Diastolic BP 76 72 78 74 - 60 70  Wt. (Lbs) 200 196 189 190 195 193 187.12  BMI 34.33 33.64 32.43 32.6 33.46 33.11 32.1   Foot/eye exam completion dates Latest Ref Rng & Units 07/05/2016  Eye Exam No Retinopathy No Retinopathy  Foot Form Completion - -    Improved, no med chnage  FATIGUE Increased fatigue with intermittent palpitations and tachycardia  EKG in office : NSR no ischemia or lVH Pt has metabolic synd which increases her cV risk, refer to cardiology for assesment  Depression with anxiety Add fluoxetine low dose. Pt to commit to exercise 3 days per week, and "girl night out"  On a regular basis F/u in 8 weeks  Hot flashes Uncontrolled , add fluoxetine daily. Behavioral changes discussed also  Palpitations Chronic palpitations and worsening fatigue. EKG and cardiology eval  Metabolic syndrome X The increased risk of cardiovascular disease associated with this diagnosis, and the need to consistently work on lifestyle to change this is discussed. Following  a  heart healthy diet ,commitment to 30 minutes of exercise at least 5 days per week, as well as control of blood sugar and cholesterol , and achieving a healthy weight are all the areas to be addressed .

## 2016-09-27 NOTE — Assessment & Plan Note (Signed)
Uncontrolled , add fluoxetine daily. Behavioral changes discussed also

## 2016-09-27 NOTE — Assessment & Plan Note (Signed)
Increased fatigue with intermittent palpitations and tachycardia  EKG in office : NSR no ischemia or lVH Pt has metabolic synd which increases her cV risk, refer to cardiology for assesment

## 2016-09-27 NOTE — Assessment & Plan Note (Signed)
The increased risk of cardiovascular disease associated with this diagnosis, and the need to consistently work on lifestyle to change this is discussed. Following  a  heart healthy diet ,commitment to 30 minutes of exercise at least 5 days per week, as well as control of blood sugar and cholesterol , and achieving a healthy weight are all the areas to be addressed .  

## 2016-09-27 NOTE — Assessment & Plan Note (Signed)
Add fluoxetine low dose. Pt to commit to exercise 3 days per week, and "girl night out"  On a regular basis F/u in 8 weeks

## 2016-09-27 NOTE — Assessment & Plan Note (Signed)
Patient educated about the importance of limiting  Carbohydrate intake , the need to commit to daily physical activity for a minimum of 30 minutes , and to commit weight loss. The fact that changes in all these areas will reduce or eliminate all together the development of diabetes is stressed.   Diabetic Labs Latest Ref Rng & Units 09/25/2016 05/23/2016 11/15/2015 07/19/2015 02/15/2015  HbA1c <5.7 % 5.7(H) 5.8(H) 6.0(H) 6.1(H) 6.0(H)  Chol <200 mg/dL 140 143 151 - 160  HDL >50 mg/dL 70 77 82 - 86  Calc LDL <100 mg/dL 57 49 55 - 60  Triglycerides <150 mg/dL 64 87 71 - 69  Creatinine 0.50 - 0.99 mg/dL 0.73 0.81 0.87 0.77 0.73   BP/Weight 09/27/2016 05/23/2016 02/01/2016 11/16/2015 09/03/2015 07/20/2015 [id number]  Systolic BP 123456 123456 123456 123456 - [id number] 123456  Diastolic BP 76 72 78 74 - 60 70  Wt. (Lbs) 200 196 189 190 195 193 187.12  BMI 34.33 33.64 32.43 32.6 33.46 33.11 32.1   Foot/eye exam completion dates Latest Ref Rng & Units 07/05/2016  Eye Exam No Retinopathy No Retinopathy  Foot Form Completion - -    Improved, no med chnage

## 2016-09-27 NOTE — Assessment & Plan Note (Signed)
Chronic palpitations and worsening fatigue. EKG and cardiology eval

## 2016-10-02 ENCOUNTER — Other Ambulatory Visit: Payer: Self-pay | Admitting: Family Medicine

## 2016-10-02 MED ORDER — ACYCLOVIR 400 MG PO TABS: 400.0000 mg | ORAL_TABLET | Freq: Three times a day (TID) | ORAL | 0 refills | Status: DC

## 2016-10-03 ENCOUNTER — Other Ambulatory Visit: Payer: Self-pay

## 2016-10-03 DIAGNOSIS — I10 Essential (primary) hypertension: Secondary | ICD-10-CM

## 2016-10-03 MED ORDER — AMLODIPINE BESYLATE 5 MG PO TABS: 5.0000 mg | ORAL_TABLET | Freq: Every day | ORAL | 3 refills | Status: DC

## 2016-10-03 MED ORDER — ZOLPIDEM TARTRATE ER 12.5 MG PO TBCR: EXTENDED_RELEASE_TABLET | ORAL | 3 refills | Status: DC

## 2016-10-03 MED ORDER — CARVEDILOL 6.25 MG PO TABS: 6.2500 mg | ORAL_TABLET | Freq: Two times a day (BID) | ORAL | 3 refills | Status: DC

## 2016-10-03 MED ORDER — VALSARTAN-HYDROCHLOROTHIAZIDE 80-12.5 MG PO TABS: 0.5000 | ORAL_TABLET | Freq: Every day | ORAL | 3 refills | Status: DC

## 2016-10-24 ENCOUNTER — Other Ambulatory Visit: Payer: Self-pay | Admitting: Family Medicine

## 2016-10-28 ENCOUNTER — Telehealth: Payer: Self-pay | Admitting: Family Medicine

## 2016-10-28 MED ORDER — OSELTAMIVIR PHOSPHATE 75 MG PO CAPS: 75.0000 mg | ORAL_CAPSULE | Freq: Two times a day (BID) | ORAL | 0 refills | Status: DC

## 2016-10-28 NOTE — Telephone Encounter (Signed)
Requests tamiflu be sent for her Mom as her grandson dx with influenza, will send in the event pt becomes symptomatic, I discussed the value of isolation and limiting droplet spread with use of a mask with her also keeping surfaces clean

## 2016-11-15 ENCOUNTER — Other Ambulatory Visit: Payer: Self-pay | Admitting: Family Medicine

## 2016-11-30 ENCOUNTER — Encounter: Payer: Self-pay | Admitting: Family Medicine

## 2016-11-30 ENCOUNTER — Ambulatory Visit (INDEPENDENT_AMBULATORY_CARE_PROVIDER_SITE_OTHER): Payer: BC Managed Care – PPO | Admitting: Family Medicine

## 2016-11-30 VITALS — BP 122/80 | HR 106 | Resp 16 | Ht 64.0 in | Wt 204.0 lb

## 2016-11-30 DIAGNOSIS — F419 Anxiety disorder, unspecified: Secondary | ICD-10-CM | POA: Diagnosis not present

## 2016-11-30 DIAGNOSIS — I10 Essential (primary) hypertension: Secondary | ICD-10-CM

## 2016-11-30 DIAGNOSIS — F5105 Insomnia due to other mental disorder: Secondary | ICD-10-CM

## 2016-11-30 DIAGNOSIS — R7303 Prediabetes: Secondary | ICD-10-CM | POA: Diagnosis not present

## 2016-11-30 DIAGNOSIS — F418 Other specified anxiety disorders: Secondary | ICD-10-CM

## 2016-11-30 DIAGNOSIS — E8881 Metabolic syndrome: Secondary | ICD-10-CM | POA: Diagnosis not present

## 2016-11-30 DIAGNOSIS — E785 Hyperlipidemia, unspecified: Secondary | ICD-10-CM | POA: Diagnosis not present

## 2016-11-30 DIAGNOSIS — E669 Obesity, unspecified: Secondary | ICD-10-CM

## 2016-11-30 MED ORDER — FLUOXETINE HCL 20 MG PO TABS: 20.0000 mg | ORAL_TABLET | Freq: Every day | ORAL | 4 refills | Status: DC

## 2016-11-30 MED ORDER — FLUOXETINE HCL 20 MG PO TABS: 20.0000 mg | ORAL_TABLET | Freq: Every day | ORAL | 1 refills | Status: DC

## 2016-11-30 NOTE — Patient Instructions (Addendum)
Annual physical exam in 4 month, call if you need me before  Increase in dose of fluoxetine to 20 mg daily  Fasting lipid, cmp and eGFR, hBA1C, CBC, TSh, order is sent to the lab  Please work on good  health habits so that your health will improve. 1. Commitment to daily physical activity for 30 to 60  minutes, if you are able to do this.  2. Commitment to wise food choices. Aim for half of your  food intake to be vegetable and fruit, one quarter starchy foods, and one quarter protein. Try to eat on a regular schedule  3 meals per day, snacking between meals should be limited to vegetables or fruits or small portions of nuts. 64 ounces of water per day is generally recommended, unless you have specific health conditions, like heart failure or kidney failure where you will need to limit fluid intake.  3. Commitment to sufficient and a  good quality of physical and mental rest daily, generally between 6 to 8 hours per day.  WITH PERSISTANCE AND PERSEVERANCE, THE IMPOSSIBLE , BECOMES THE NORM! Thank you  for choosing [facility]. We consider it a privelige to serve you.  Delivering excellent health care in a caring and  compassionate way is our goal.  Partnering with you,  so that together we can achieve this goal is our strategy.

## 2016-12-02 NOTE — Assessment & Plan Note (Signed)
Controlled, no change in medication DASH diet and commitment to daily physical activity for a minimum of 30 minutes discussed and encouraged, as a part of hypertension management. The importance of attaining a healthy weight is also discussed.  BP/Weight 11/30/2016 09/27/2016 05/23/2016 02/01/2016 11/16/2015 09/03/2015 38/93/7342  Systolic BP 876 811 572 620 355 - 974  Diastolic BP 80 76 72 78 74 - 60  Wt. (Lbs) 204 200 196 189 190 195 193  BMI 35.02 34.33 33.64 32.43 32.6 33.46 33.11

## 2016-12-02 NOTE — Assessment & Plan Note (Signed)
Improved, increase fluoxetine dose

## 2016-12-02 NOTE — Assessment & Plan Note (Signed)
Sleep hygiene reviewed and written information offered also. Prescription sent for  medication needed.  

## 2016-12-02 NOTE — Progress Notes (Signed)
Alexis Hurst     MRN: [id number]      DOB: 1951-08-25   HPI [name] is here for follow up and re-evaluation of chronic medical conditions, medication management and review of any available recent lab and radiology data.  Preventive health is updated, specifically  Cancer screening and Immunization.   Questions or concerns regarding consultations or procedures which the PT has had in the interim are  addressed. The PT denies any adverse reactions to current medications since the last visit.  Ongoing family stress which she is coping better with, no adverse effects from the fluoxetine Concerned about weight gain and is aware that she is in part self medicating with food  ROS Denies recent fever or chills. Denies sinus pressure, nasal congestion, ear pain or sore throat. Denies chest congestion, productive cough or wheezing. Denies chest pains, palpitations and leg swelling Denies abdominal pain, nausea, vomiting,diarrhea or constipation.   Denies dysuria, frequency, hesitancy or incontinence. Denies joint pain, swelling and limitation in mobility. Denies headaches, seizures, numbness, or tingling. Denies uncontrolled  depression, anxiety or insomnia. Denies skin break down or rash.   PE  BP 122/80   Pulse (!) 106   Resp 16   Ht 5\' 4"  (1.626 m)   Wt 204 lb (92.5 kg)   SpO2 98%   BMI 35.02 kg/m   Patient alert and oriented and in no cardiopulmonary distress.  HEENT: No facial asymmetry, EOMI,   oropharynx pink and moist.  Neck supple no JVD, no mass.  Chest: Clear to auscultation bilaterally.  CVS: S1, S2 no murmurs, no S3.Regular rate.  ABD: Soft non tender.   Ext: No edema  MS: Adequate ROM spine, shoulders, hips and knees.  Skin: Intact, no ulcerations or rash noted.  Psych: Good eye contact, normal affect. Memory intact not anxious or depressed appearing.  CNS: CN 2-12 intact, power,  normal throughout.no focal deficits noted.   Assessment &  Plan Essential hypertension Controlled, no change in medication DASH diet and commitment to daily physical activity for a minimum of 30 minutes discussed and encouraged, as a part of hypertension management. The importance of attaining a healthy weight is also discussed.  BP/Weight 11/30/2016 09/27/2016 05/23/2016 02/01/2016 11/16/2015 09/03/2015 81/85/6314  Systolic BP 970 263 785 885 027 - 741  Diastolic BP 80 76 72 78 74 - 60  Wt. (Lbs) 204 200 196 189 190 195 193  BMI 35.02 34.33 33.64 32.43 32.6 33.46 33.11       Hyperlipidemia LDL goal <100 Hyperlipidemia:Low fat diet discussed and encouraged.   Lipid Panel  Lab Results  Component Value Date   CHOL 140 09/25/2016   HDL 70 09/25/2016   LDLCALC 57 09/25/2016   TRIG 64 09/25/2016   CHOLHDL 2.0 09/25/2016  Controlled, no change in medication Updated lab needed at/ before next visit.      Obesity (BMI 30.0-34.9) Deteriorated. Patient re-educated about  the importance of commitment to a  minimum of 150 minutes of exercise per week.  The importance of healthy food choices with portion control discussed. Encouraged to start a food diary, count calories and to consider  joining a support group. Sample diet sheets offered. Goals set by the patient for the next several months.   Weight /BMI 11/30/2016 09/27/2016 05/23/2016  WEIGHT 204 lb 200 lb 196 lb  HEIGHT 5\' 4"  5\' 4"  5\' 4"   BMI 35.02 kg/m2 34.33 kg/m2 33.64 kg/m2      Depression with anxiety Improved, increase fluoxetine  dose  Hyposomnia, insomnia or sleeplessness associated with anxiety Sleep hygiene reviewed and written information offered also. Prescription sent for  medication needed.   Metabolic syndrome X The increased risk of cardiovascular disease associated with this diagnosis, and the need to consistently work on lifestyle to change this is discussed. Following  a  heart healthy diet ,commitment to 30 minutes of exercise at least 5 days per week, as well  as control of blood sugar and cholesterol , and achieving a healthy weight are all the areas to be addressed .

## 2016-12-02 NOTE — Assessment & Plan Note (Signed)
Hyperlipidemia:Low fat diet discussed and encouraged.   Lipid Panel  Lab Results  Component Value Date   CHOL 140 09/25/2016   HDL 70 09/25/2016   LDLCALC 57 09/25/2016   TRIG 64 09/25/2016   CHOLHDL 2.0 09/25/2016  Controlled, no change in medication Updated lab needed at/ before next visit.

## 2016-12-02 NOTE — Assessment & Plan Note (Signed)
Deteriorated. Patient re-educated about  the importance of commitment to a  minimum of 150 minutes of exercise per week.  The importance of healthy food choices with portion control discussed. Encouraged to start a food diary, count calories and to consider  joining a support group. Sample diet sheets offered. Goals set by the patient for the next several months.   Weight /BMI 11/30/2016 09/27/2016 05/23/2016  WEIGHT 204 lb 200 lb 196 lb  HEIGHT 5\' 4"  5\' 4"  5\' 4"   BMI 35.02 kg/m2 34.33 kg/m2 33.64 kg/m2

## 2016-12-02 NOTE — Assessment & Plan Note (Signed)
The increased risk of cardiovascular disease associated with this diagnosis, and the need to consistently work on lifestyle to change this is discussed. Following  a  heart healthy diet ,commitment to 30 minutes of exercise at least 5 days per week, as well as control of blood sugar and cholesterol , and achieving a healthy weight are all the areas to be addressed .  

## 2016-12-25 ENCOUNTER — Other Ambulatory Visit: Payer: Self-pay | Admitting: Family Medicine

## 2017-01-31 ENCOUNTER — Encounter: Payer: Self-pay | Admitting: Family Medicine

## 2017-01-31 ENCOUNTER — Ambulatory Visit (INDEPENDENT_AMBULATORY_CARE_PROVIDER_SITE_OTHER): Payer: BC Managed Care – PPO | Admitting: Family Medicine

## 2017-01-31 VITALS — BP 168/90 | HR 108 | Temp 97.4°F | Resp 16 | Ht 64.0 in | Wt 207.0 lb

## 2017-01-31 DIAGNOSIS — Z91038 Other insect allergy status: Secondary | ICD-10-CM | POA: Diagnosis not present

## 2017-01-31 MED ORDER — METHYLPREDNISOLONE 4 MG PO TBPK: ORAL_TABLET | ORAL | 0 refills | Status: DC

## 2017-01-31 NOTE — Patient Instructions (Signed)
Take the prednisone pak Take all of day one today Continue antihistamines as needed for itching Call for problems

## 2017-01-31 NOTE — Progress Notes (Signed)
Chief Complaint  Patient presents with  . Insect Bite    neck, left ear   Insect bites today Does not recall when they happened but woke up with them One on the R ear, one back of neck She has had increasing reactions to insect bites lately with larger and more swollen reactions Itch terribly No pain or fever No shortness of breath, maybe a vague feeling of throat swelling, maybe anxiety.  Patient Active Problem List   Diagnosis Date Noted  . Hot flashes 09/27/2016  . Metabolic syndrome X 57/84/6962  . 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 Piles (hemorrhoids) 07/20/2015  . Hyperlipidemia LDL goal <100 02/15/2015  . Diverticulosis 12/11/2013  . FH: colon cancer 04/30/2013  . DDD (degenerative disc disease), lumbosacral 01/21/2013  . Allergic sinusitis 12/18/2012  . Prediabetes 05/28/2011  . ARTHRITIS 04/11/2010  . Palpitations 06/08/2009  . FATIGUE 02/26/2009  . Obesity (BMI 30.0-34.9) 02/04/2008  . ANXIETY 02/04/2008  . Depression with anxiety 02/04/2008  . Essential hypertension 02/04/2008  . Hyposomnia, insomnia or sleeplessness associated with anxiety 02/04/2008    Outpatient Encounter Prescriptions as of 01/31/2017  Medication Sig  . acyclovir (ZOVIRAX) 400 MG tablet Take 1 tablet (400 mg total) by mouth 3 (three) times daily.  Marland Kitchen amLODipine (NORVASC) 5 MG tablet Take 1 tablet (5 mg total) by mouth daily.  Marland Kitchen aspirin (ASPIRIN LOW DOSE) 81 MG EC tablet Take 81 mg by mouth daily.    Marland Kitchen atorvastatin (LIPITOR) 20 MG tablet TAKE 1 TABLET(20 MG) BY MOUTH DAILY  . Calcium Carbonate-Vitamin D (CALCIUM + D) 600-200 MG-UNIT TABS Take 1 tablet by mouth daily.   . carvedilol (COREG) 6.25 MG tablet Take 1 tablet (6.25 mg total) by mouth 2 (two) times daily with a meal.  . FLUoxetine (PROZAC) 20 MG tablet Take 1 tablet (20 mg total) by mouth daily.  Marland Kitchen glucose blood (ONE TOUCH ULTRA TEST) test strip Use as instructed once daily dx e11.9  . metFORMIN (GLUCOPHAGE) 1000 MG tablet  TAKE 1 TABLET BY MOUTH EVERY DAY WITH BREAKFAST  . mometasone (NASONEX) 50 MCG/ACT nasal spray 2 sprays into each nostril daily  . Multiple Vitamin (MULTIVITAMIN WITH MINERALS) TABS tablet Take 1 tablet by mouth daily.  Marland Kitchen oseltamivir (TAMIFLU) 75 MG capsule Take 1 capsule (75 mg total) by mouth 2 (two) times daily at 10 AM and 5 PM.  . valsartan-hydrochlorothiazide (DIOVAN-HCT) 80-12.5 MG tablet Take 0.5 tablets by mouth daily.  Marland Kitchen venlafaxine XR (EFFEXOR-XR) 75 MG 24 hr capsule TAKE 1 CAPSULE(75 MG) BY MOUTH DAILY  . vitamin B-12 (CYANOCOBALAMIN) 1000 MCG tablet Take 1,000 mcg by mouth daily.  Marland Kitchen zolpidem (AMBIEN CR) 12.5 MG CR tablet TAKE 1 TABLET BY MOUTH EVERY DAY AT BEDTIME AS NEEDED FOR SLEEP  . methylPREDNISolone (MEDROL DOSEPAK) 4 MG TBPK tablet Tad   No facility-administered encounter medications on file as of 01/31/2017.     Allergies  Allergen Reactions  . Lactose Intolerance (Gi) Diarrhea and Nausea And Vomiting    Review of Systems  Constitutional: Negative for chills and fever.  HENT: Negative for trouble swallowing and voice change.   Respiratory: Negative for shortness of breath.   Cardiovascular: Negative for chest pain and leg swelling.  Skin: Positive for rash.  All other systems reviewed and are negative.   BP (!) 168/90 (BP Location: Right Arm, Patient Position: Sitting, Cuff Size: Normal)   Pulse (!) 108   Temp 97.4 F (36.3 C) (Temporal)   Resp 16  Ht 5\' 4"  (1.626 m)   Wt 207 lb (93.9 kg)   SpO2 98%   BMI 35.53 kg/m   Physical Exam  Constitutional: She is oriented to person, place, and time. She appears well-developed and well-nourished. No distress.  HENT:  Head: Normocephalic and atraumatic.  Mouth/Throat: Oropharynx is clear and moist.  Eyes: Conjunctivae are normal. Pupils are equal, round, and reactive to light.  Neck: Normal range of motion. Neck supple. Thyromegaly present.    Cardiovascular: Normal rate, regular rhythm and normal heart  sounds.   Pulmonary/Chest: Effort normal and breath sounds normal. She has no wheezes.  Lymphadenopathy:    She has cervical adenopathy.  Neurological: She is alert and oriented to person, place, and time.  Skin: Skin is warm. There is erythema.  Pinna R ear swollen and red  Psychiatric: She has a normal mood and affect. Her behavior is normal.    ASSESSMENT/PLAN:  1. Allergy to insect bites Local allergic reaction   Patient Instructions  Take the prednisone pak Take all of day one today Continue antihistamines as needed for itching Call for problems   Raylene Everts, MD

## 2017-02-03 ENCOUNTER — Other Ambulatory Visit: Payer: Self-pay | Admitting: Family Medicine

## 2017-02-12 ENCOUNTER — Other Ambulatory Visit: Payer: Self-pay | Admitting: Family Medicine

## 2017-02-12 DIAGNOSIS — I10 Essential (primary) hypertension: Secondary | ICD-10-CM

## 2017-02-21 ENCOUNTER — Other Ambulatory Visit: Payer: Self-pay | Admitting: Family Medicine

## 2017-02-22 ENCOUNTER — Other Ambulatory Visit: Payer: Self-pay | Admitting: Family Medicine

## 2017-03-28 LAB — CBC
HEMATOCRIT: 38.1 % (ref 35.0–45.0)
HEMOGLOBIN: 12.5 g/dL (ref 11.7–15.5)
MCH: 30.2 pg (ref 27.0–33.0)
MCHC: 32.8 g/dL (ref 32.0–36.0)
MCV: 92 fL (ref 80.0–100.0)
MPV: 10.2 fL (ref 7.5–12.5)
Platelets: 290 10*3/uL (ref 140–400)
RBC: 4.14 MIL/uL (ref 3.80–5.10)
RDW: 14.4 % (ref 11.0–15.0)
WBC: 4.9 10*3/uL (ref 3.8–10.8)

## 2017-03-29 LAB — COMPLETE METABOLIC PANEL WITH GFR
ALBUMIN: 3.9 g/dL (ref 3.6–5.1)
ALK PHOS: 106 U/L (ref 33–130)
ALT: 39 U/L — ABNORMAL HIGH (ref 6–29)
AST: 30 U/L (ref 10–35)
BUN: 10 mg/dL (ref 7–25)
CO2: 26 mmol/L (ref 20–31)
Calcium: 9 mg/dL (ref 8.6–10.4)
Chloride: 104 mmol/L (ref 98–110)
Creat: 0.79 mg/dL (ref 0.50–0.99)
GFR, EST NON AFRICAN AMERICAN: 80 mL/min (ref >=60)
GFR, Est African American: >89 mL/min (ref >=60)
GLUCOSE: 90 mg/dL (ref 65–99)
POTASSIUM: 3.8 mmol/L (ref 3.5–5.3)
SODIUM: 140 mmol/L (ref 135–146)
Total Bilirubin: 0.5 mg/dL (ref 0.2–1.2)
Total Protein: 6.9 g/dL (ref 6.1–8.1)

## 2017-03-29 LAB — LIPID PANEL
CHOL/HDL RATIO: 2.1 ratio (ref <5.0)
Cholesterol: 150 mg/dL (ref <200)
HDL: 72 mg/dL (ref >50)
LDL CALC: 59 mg/dL (ref <100)
Triglycerides: 94 mg/dL (ref <150)
VLDL: 19 mg/dL (ref <30)

## 2017-03-29 LAB — HEMOGLOBIN A1C
Hgb A1c MFr Bld: 5.9 % — ABNORMAL HIGH (ref <5.7)
Mean Plasma Glucose: 123 mg/dL

## 2017-03-29 LAB — TSH: TSH: 0.8 mIU/L

## 2017-04-02 ENCOUNTER — Other Ambulatory Visit (HOSPITAL_COMMUNITY)
Admission: RE | Admit: 2017-04-02 | Discharge: 2017-04-02 | Disposition: A | Discharge status: Home / Self Care | Payer: BC Managed Care – PPO | Source: Ambulatory Visit | Attending: Family Medicine | Admitting: Family Medicine

## 2017-04-02 ENCOUNTER — Encounter: Payer: Self-pay | Admitting: Family Medicine

## 2017-04-02 ENCOUNTER — Ambulatory Visit (INDEPENDENT_AMBULATORY_CARE_PROVIDER_SITE_OTHER): Payer: BC Managed Care – PPO | Admitting: Family Medicine

## 2017-04-02 VITALS — BP 130/80 | HR 101 | Temp 97.7°F | Resp 16 | Ht 64.0 in | Wt 206.2 lb

## 2017-04-02 DIAGNOSIS — Z0000 Encounter for general adult medical examination without abnormal findings: Secondary | ICD-10-CM

## 2017-04-02 DIAGNOSIS — R740 Nonspecific elevation of levels of transaminase and lactic acid dehydrogenase [LDH]: Secondary | ICD-10-CM

## 2017-04-02 DIAGNOSIS — I10 Essential (primary) hypertension: Secondary | ICD-10-CM

## 2017-04-02 DIAGNOSIS — L500 Allergic urticaria: Secondary | ICD-10-CM | POA: Insufficient documentation

## 2017-04-02 DIAGNOSIS — Z1211 Encounter for screening for malignant neoplasm of colon: Secondary | ICD-10-CM

## 2017-04-02 DIAGNOSIS — R7401 Elevation of levels of liver transaminase levels: Secondary | ICD-10-CM

## 2017-04-02 DIAGNOSIS — Z01419 Encounter for gynecological examination (general) (routine) without abnormal findings: Secondary | ICD-10-CM

## 2017-04-02 HISTORY — DX: Allergic urticaria: L50.0

## 2017-04-02 LAB — HEMOCCULT GUIAC POC 1CARD (OFFICE): FECAL OCCULT BLD: NEGATIVE

## 2017-04-02 MED ORDER — ATORVASTATIN CALCIUM 10 MG PO TABS: 10.0000 mg | ORAL_TABLET | Freq: Every day | ORAL | 3 refills | Status: DC

## 2017-04-02 MED ORDER — OLMESARTAN MEDOXOMIL-HCTZ 20-12.5 MG PO TABS: ORAL_TABLET | ORAL | 3 refills | Status: DC

## 2017-04-02 MED ORDER — BETAMETHASONE DIPROPIONATE 0.05 % EX CREA: TOPICAL_CREAM | Freq: Two times a day (BID) | CUTANEOUS | 0 refills | Status: DC

## 2017-04-02 MED ORDER — EPINEPHRINE 0.3 MG/0.3ML IJ SOAJ: 0.3000 mg | Freq: Once | INTRAMUSCULAR | 0 refills | Status: AC

## 2017-04-02 NOTE — Patient Instructions (Addendum)
F/u in 4 month, call if you need me before  Reduce atorvastatin to HALF 20 mg tablet daily, new script is for 10 mg daily  Work on weight loss  You are referred for [place] of liver we will call with appt.  New medication for blood pressure due to recall of valsartan  New steroid cream for rash , betamethasone which is stronger than hydrocortisone, use sparingly  You are referred to allergist  Epi pen prescribed in case yo have difficulty breathing with allergic reaction  Fasting lipid, cmp and eGFR and Vit D 1 week before follow up  Thank you  for choosing [facility]. We consider it a privelige to serve you.  Delivering excellent health care in a caring and  compassionate way is our goal.  Partnering with you,  so that together we can achieve this goal is our strategy.   Nurse visit for blood pressure e re eval in 6 weeks

## 2017-04-02 NOTE — Assessment & Plan Note (Signed)
Drinks approx 1 bottle of wine per week, which is new, advised to reduce amt, will get [place] of liver, likely fatty liver, weight loss advised. Reduce lipitor dose also as cholesterol is excellent

## 2017-04-02 NOTE — Progress Notes (Signed)
    Alexis Hurst     MRN: [id number]      DOB: 1950-10-16  HPI: Patient is in for annual physical exam. C/o new onset recurrent redness , swelling and itching of neck , arms and around waistline, very severe, unknown cause Recent labs,  are reviewed., medications are adjusted and tests ordered Immunization is reviewed , holding on tdaP Asked about hR of 101, denies chest pain, reports a lot of caffeine use, I recommend reduce caffeine intake PE:  BP 130/80 (BP Location: Left Arm, Patient Position: Sitting, Cuff Size: Normal)   Pulse (!) 101   Temp 97.7 F (36.5 C) (Other (Comment))   Resp 16   Ht 5\' 4"  (1.626 m)   Wt 206 lb 4 oz (93.6 kg)   SpO2 97%   BMI 35.40 kg/m   Pleasant  female, alert and oriented x 3, in no cardio-pulmonary distress. Afebrile. HEENT No facial trauma or asymetry. Sinuses non tender.  Extra occullar muscles intact, External ears normal, tympanic membranes clear. Oropharynx moist, . Neck: supple, no adenopathy,JVD or thyromegaly.No bruits.  Chest: Clear to ascultation bilaterally.No crackles or wheezes. Non tender to palpation  Breast: No asymetry,no masses or lumps. No tenderness. No nipple discharge or inversion. No axillary or supraclavicular adenopathy  Cardiovascular system; Heart sounds normal,  S1 and  S2 ,no S3.  No murmur, or thrill. Apical beat not displaced Peripheral pulses normal.  Abdomen: Soft, non tender, no organomegaly or masses. No bruits. Bowel sounds normal. No guarding, tenderness or rebound.  Rectal:  Normal sphincter tone. No rectal mass. Guaiac negative stool.  GU: External genitalia normal female genitalia , normal female distribution of hair. No lesions. Urethral meatus normal in size, no  Prolapse, no lesions visibly  Present. Bladder non tender. Vagina pink and moist , with no visible lesions , discharge present . Adequate pelvic support no  cystocele or rectocele noted  Uterus absent, no adnexal  masses, no  adnexal tenderness.   Musculoskeletal exam: Full ROM of spine, hips , shoulders and knees. No deformity ,swelling or crepitus noted. No muscle wasting or atrophy.   Neurologic: Cranial nerves 2 to 12 intact. Power, tone ,sensation and reflexes normal throughout. No disturbance in gait. No tremor.  Skin: Intact,erythema , and macula papular rash noted on both forearms Pigmentation normal throughout  Psych; Normal mood and affect. Judgement and concentration normal   Assessment & Plan:  Annual physical exam Annual exam as documented. Counseling done  re healthy lifestyle involving commitment to 150 minutes exercise per week, heart healthy diet, and attaining healthy weight.The importance of adequate sleep also discussed. Regular seat belt use and home safety, is also discussed. Changes in health habits are decided on by the patient with goals and time frames  set for achieving them. Immunization and cancer screening needs are specifically addressed at this visit.   Allergic urticaria 4 recent episodes of severe urticaria , unknown etiology, affecting back of neck, each upper arm, and waistline in past 6 weeks approximately. Commit to daily zyrtec, refer to allergist, betamethasone cream prescribed Epi pen script written  Essential hypertension Divan recall, changed to benicar/hctz  Transaminitis Drinks approx 1 bottle of wine per week, which is new, advised to reduce amt, will get [place] of liver, likely fatty liver, weight loss advised. Reduce lipitor dose also as cholesterol is excellent

## 2017-04-02 NOTE — Assessment & Plan Note (Signed)
Divan recall, changed to benicar/hctz

## 2017-04-02 NOTE — Assessment & Plan Note (Signed)

## 2017-04-02 NOTE — Assessment & Plan Note (Addendum)
4 recent episodes of severe urticaria , unknown etiology, affecting back of neck, each upper arm, and waistline in past 6 weeks approximately. Commit to daily zyrtec, refer to allergist, betamethasone cream prescribed Epi pen script written

## 2017-04-04 ENCOUNTER — Encounter: Payer: Self-pay | Admitting: Family Medicine

## 2017-04-04 LAB — CYTOLOGY - PAP
DIAGNOSIS: NEGATIVE
HPV: NOT DETECTED

## 2017-04-12 ENCOUNTER — Ambulatory Visit (HOSPITAL_COMMUNITY)
Admission: RE | Admit: 2017-04-12 | Discharge: 2017-04-12 | Disposition: A | Discharge status: Home / Self Care | Payer: BC Managed Care – PPO | Source: Ambulatory Visit | Attending: Family Medicine | Admitting: Family Medicine

## 2017-04-12 DIAGNOSIS — R740 Nonspecific elevation of levels of transaminase and lactic acid dehydrogenase [LDH]: Secondary | ICD-10-CM | POA: Diagnosis not present

## 2017-04-12 DIAGNOSIS — R7401 Elevation of levels of liver transaminase levels: Secondary | ICD-10-CM

## 2017-04-13 ENCOUNTER — Encounter: Payer: Self-pay | Admitting: Family Medicine

## 2017-04-13 DIAGNOSIS — E559 Vitamin D deficiency, unspecified: Secondary | ICD-10-CM

## 2017-04-13 DIAGNOSIS — R7303 Prediabetes: Secondary | ICD-10-CM

## 2017-04-13 DIAGNOSIS — E8881 Metabolic syndrome: Secondary | ICD-10-CM

## 2017-04-13 DIAGNOSIS — I10 Essential (primary) hypertension: Secondary | ICD-10-CM

## 2017-04-16 NOTE — Telephone Encounter (Signed)
-----   Message from Fayrene Helper, MD sent at 04/02/2017 12:54 PM EDT ----- Regarding: pls follow up Needs fasting lipid, cmp and vit D 1 week before her 4 month f/u with me  Scripts to be faxed in due to med changes, she will be at the pharmacy today looking for them!!!

## 2017-04-28 ENCOUNTER — Other Ambulatory Visit: Payer: Self-pay | Admitting: Family Medicine

## 2017-05-08 ENCOUNTER — Ambulatory Visit (INDEPENDENT_AMBULATORY_CARE_PROVIDER_SITE_OTHER): Payer: BC Managed Care – PPO | Admitting: Family Medicine

## 2017-05-08 VITALS — BP 124/80

## 2017-05-08 DIAGNOSIS — I10 Essential (primary) hypertension: Secondary | ICD-10-CM

## 2017-06-13 ENCOUNTER — Other Ambulatory Visit: Payer: Self-pay | Admitting: Family Medicine

## 2017-06-19 ENCOUNTER — Other Ambulatory Visit: Payer: Self-pay | Admitting: Family Medicine

## 2017-06-19 DIAGNOSIS — Z1231 Encounter for screening mammogram for malignant neoplasm of breast: Secondary | ICD-10-CM

## 2017-06-21 ENCOUNTER — Ambulatory Visit (HOSPITAL_COMMUNITY)
Admission: RE | Admit: 2017-06-21 | Discharge: 2017-06-21 | Disposition: A | Discharge status: Home / Self Care | Payer: BC Managed Care – PPO | Source: Ambulatory Visit | Attending: Family Medicine | Admitting: Family Medicine

## 2017-06-21 DIAGNOSIS — Z1231 Encounter for screening mammogram for malignant neoplasm of breast: Secondary | ICD-10-CM

## 2017-07-27 ENCOUNTER — Other Ambulatory Visit: Payer: Self-pay | Admitting: Family Medicine

## 2017-07-30 ENCOUNTER — Other Ambulatory Visit: Payer: Self-pay | Admitting: Family Medicine

## 2017-07-30 MED ORDER — ZOLPIDEM TARTRATE ER 12.5 MG PO TBCR: 12.5000 mg | EXTENDED_RELEASE_TABLET | Freq: Every evening | ORAL | 5 refills | Status: DC | PRN

## 2017-07-31 ENCOUNTER — Encounter: Payer: Self-pay | Admitting: Allergy & Immunology

## 2017-07-31 ENCOUNTER — Other Ambulatory Visit (HOSPITAL_COMMUNITY)
Admission: RE | Admit: 2017-07-31 | Discharge: 2017-07-31 | Disposition: A | Discharge status: Home / Self Care | Payer: BC Managed Care – PPO | Source: Ambulatory Visit | Attending: Allergy & Immunology | Admitting: Allergy & Immunology

## 2017-07-31 ENCOUNTER — Ambulatory Visit (INDEPENDENT_AMBULATORY_CARE_PROVIDER_SITE_OTHER): Payer: BC Managed Care – PPO | Admitting: Allergy & Immunology

## 2017-07-31 VITALS — BP 130/75 | HR 75 | Temp 98.0°F | Resp 16 | Ht 64.17 in | Wt 208.6 lb

## 2017-07-31 DIAGNOSIS — J310 Chronic rhinitis: Secondary | ICD-10-CM

## 2017-07-31 DIAGNOSIS — W57XXXD Bitten or stung by nonvenomous insect and other nonvenomous arthropods, subsequent encounter: Secondary | ICD-10-CM | POA: Insufficient documentation

## 2017-07-31 NOTE — Progress Notes (Signed)
NEW PATIENT  Date of Service/Encounter:  07/31/17  Referring provider: Fayrene Helper, MD   Assessment:   Chronic rhinitis  Exaggerated response to insect bites  Plan/Recommendations:   1. Chronic rhinitis - We will send some blood work to look for environmental allergies. - We did offer skin testing during her visit, but she preferred blood testing since we were going to be getting blood anyway.  - In the meantime, continue with fluticasone 1-2 sprays per nostril daily as well in conjunction with Zyrtec 10mg  daily as needed.  2. Insect bite - We will get a stinging insect panel today as well as a tryptase. - We did discuss the fact that although mosquito IgE testing was available in the past, this is no longer possible to order. - There are mere case reports of anaphylaxis to mosquitos, so the testing never changed management for the most part.  - EpiPen is up to date. - Although she has has had no systemic reactions, large local reactions are an indication for receiving immunotherapy for stinging insects. - Therefore, if testing is revealing, we could discuss venom immunotherapy as a means of controlling future reactions.  - If the blood testing is negative, we will likely recommend skin testing for stinging insects.   3. Return in about 3 months (around 10/31/2017).   Subjective:   Alexis Hurst is a 66 y.o. female presenting today for evaluation of No chief complaint on file.   Alexis Hurst has a history of the following: Patient Active Problem List   Diagnosis Date Noted  . Allergic urticaria 04/02/2017  . Transaminitis 04/02/2017  . Hot flashes 09/27/2016  . Metabolic syndrome X 79/10/4095  . 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 Annual physical exam 11/16/2015  . Piles (hemorrhoids) 07/20/2015  . Hyperlipidemia LDL goal <100 02/15/2015  . Diverticulosis 12/11/2013  . FH: colon cancer 04/30/2013  . DDD (degenerative disc disease), lumbosacral  01/21/2013  . Allergic sinusitis 12/18/2012  . Prediabetes 05/28/2011  . ARTHRITIS 04/11/2010  . Palpitations 06/08/2009  . FATIGUE 02/26/2009  . Obesity (BMI 30.0-34.9) 02/04/2008  . ANXIETY 02/04/2008  . Depression with anxiety 02/04/2008  . Essential hypertension 02/04/2008  . Hyposomnia, insomnia or sleeplessness associated with anxiety 02/04/2008    History obtained from: chart review and patient.  Alexis Hurst was referred by Fayrene Helper, MD.     Alexis Hurst is a 66 y.o. female presenting for an evaluation due to hyperreactivity to insect bites. She does not think that it has all been mosquitoes, as one of them might have been a spider bite. She swells, beyond that which would have been considered normal. She did get an EpiPen at the last episode from her PCP. These episodes occurred over the summer, but have been less of an issue since the weather has changed. At each of these episodes, she would have marked swelling that did pass beyond joints. Swelling was very pruritic and would last for 2-3 days. It did respond to Zyrtec. She denies SOB, whreezing, or passing out. She has never needed to go to the ED for these symptoms, but has needed steroids on at least one occasion. She denies problems with wasps, hornets, or bees. She has had episodes both inside and outside. She thinks that one was a spider. She denies bed bugs and her spouse had no problems with these symptoms.   She does have a history of seasonal allergies. Symptoms are worse in the spring. She has had  problems only recently as she becomes older. She does have a nose spray - fluticasone - which she has kept on hand but does not use on a regular basis. Symptoms overall are better in the winter, but never resolve. She does endorse chronic postnasal drip and constant throat clearing. She does sleep at night and actually takes a sleep medication.   She does endorse lactose intolerance but otherwise tolerates all of the  major food allergens without a problem. She has no history of asthma. Otherwise, there is no history of other atopic diseases, including drug allergies or urticaria. There is no significant infectious history. Vaccinations are up to date.    Past Medical History: Patient Active Problem List   Diagnosis Date Noted  . Allergic urticaria 04/02/2017  . Transaminitis 04/02/2017  . Hot flashes 09/27/2016  . Metabolic syndrome X 42/70/6237  . 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 Annual physical exam 11/16/2015  . Piles (hemorrhoids) 07/20/2015  . Hyperlipidemia LDL goal <100 02/15/2015  . Diverticulosis 12/11/2013  . FH: colon cancer 04/30/2013  . DDD (degenerative disc disease), lumbosacral 01/21/2013  . Allergic sinusitis 12/18/2012  . Prediabetes 05/28/2011  . ARTHRITIS 04/11/2010  . Palpitations 06/08/2009  . FATIGUE 02/26/2009  . Obesity (BMI 30.0-34.9) 02/04/2008  . ANXIETY 02/04/2008  . Depression with anxiety 02/04/2008  . Essential hypertension 02/04/2008  . Hyposomnia, insomnia or sleeplessness associated with anxiety 02/04/2008    Medication List:  Allergies as of 07/31/2017      Reactions   Lactose Intolerance (gi) Diarrhea, Nausea And Vomiting      Medication List        Accurate as of 07/31/17 12:54 PM. Always use your most recent med list.          acyclovir 400 MG tablet Commonly known as:  ZOVIRAX Take 1 tablet (400 mg total) by mouth 3 (three) times daily.   amLODipine 5 MG tablet Commonly known as:  NORVASC TAKE 1 TABLET(5 MG) BY MOUTH DAILY   ASPIRIN LOW DOSE 81 MG EC tablet Generic drug:  aspirin Take 81 mg by mouth daily.   atorvastatin 10 MG tablet Commonly known as:  LIPITOR Take 1 tablet (10 mg total) by mouth daily.   betamethasone dipropionate 0.05 % cream Commonly known as:  DIPROLENE Apply topically 2 (two) times daily.   Calcium Carbonate-Vitamin D 600-200 MG-UNIT Tabs Take 1 tablet by mouth daily.   carvedilol 6.25 MG  tablet Commonly known as:  COREG TAKE 1 TABLET BY MOUTH TWICE DAILY WITH A MEAL   FLUoxetine 20 MG tablet Commonly known as:  PROZAC TAKE 1 TABLET BY MOUTH DAILY   glucose blood test strip Commonly known as:  ONE TOUCH ULTRA TEST Use as instructed once daily dx e11.9   metFORMIN 1000 MG tablet Commonly known as:  GLUCOPHAGE TAKE 1 TABLET BY MOUTH EVERY DAY WITH BREAKFAST   mometasone 50 MCG/ACT nasal spray Commonly known as:  NASONEX 2 sprays into each nostril daily   multivitamin with minerals Tabs tablet Take 1 tablet by mouth daily.   olmesartan-hydrochlorothiazide 20-12.5 MG tablet Commonly known as:  BENICAR HCT One half tablet once daily   oseltamivir 75 MG capsule Commonly known as:  TAMIFLU Take 1 capsule (75 mg total) by mouth 2 (two) times daily at 10 AM and 5 PM.   venlafaxine XR 75 MG 24 hr capsule Commonly known as:  EFFEXOR-XR TAKE 1 CAPSULE(75 MG) BY MOUTH DAILY   vitamin B-12 1000 MCG tablet Commonly known as:  CYANOCOBALAMIN Take 1,000 mcg by mouth daily.   zolpidem 12.5 MG CR tablet Commonly known as:  AMBIEN CR TAKE 1 TABLET BY MOUTH EVERY DAY AT BEDTIME AS NEEDED FOR SLEEP       Birth History: non-contributory.    Developmental History: non-contributory.   Past Surgical History: Past Surgical History:  Procedure Laterality Date  . ABDOMINAL HYSTERECTOMY    . BREAST SURGERY    . COLONOSCOPY  06/11/2003   Smith:multiple medium scatered diverticula in the cecum, a sending colon, transverse colon, descending colon, sigmoid colon.  . COLONOSCOPY N/A 05/20/2013   TICS, SML IH  . cystectomy L breast, benign    . VESICOVAGINAL FISTULA CLOSURE W/ TAH  1989     Family History: Family History  Problem Relation Age of Onset  . Hypertension Mother   . Urticaria Mother   . Heart failure Father   . Cancer Father        lung   . Asthma Sister   . Hypertension Sister   . Hypertension Sister   . Diabetes Brother   . Colon cancer Maternal  Grandfather        age greater than 32  . Crohn's disease Brother   . Allergic rhinitis Neg Hx   . Angioedema Neg Hx   . Atopy Neg Hx   . Eczema Neg Hx   . Immunodeficiency Neg Hx      Social History: Alexis Hurst lives at home with her husband of 50 years. She is a retired Radio broadcast assistant and taught for 37 years. They live in a house that is 64yrs old. There is carpeting throughout the home with some parquet flooring in some of the main living areas. There is electric heating and central cooling. There are no animals inside or outside of the home. She does not have dust mite coverings on her bedding. There is no tobacco exposure.     Review of Systems: a 14-point review of systems is pertinent for what is mentioned in HPI.  Otherwise, all other systems were negative. Constitutional: negative other than that listed in the HPI Eyes: negative other than that listed in the HPI Ears, nose, mouth, throat, and face: negative other than that listed in the HPI Respiratory: negative other than that listed in the HPI Cardiovascular: negative other than that listed in the HPI Gastrointestinal: negative other than that listed in the HPI Genitourinary: negative other than that listed in the HPI Integument: negative other than that listed in the HPI Hematologic: negative other than that listed in the HPI Musculoskeletal: negative other than that listed in the HPI Neurological: negative other than that listed in the HPI Allergy/Immunologic: negative other than that listed in the HPI    Objective:   Blood pressure 130/75, pulse 75, temperature 98 F (36.7 C), temperature source Oral, resp. rate 16, height 5' 4.17" (1.63 m), weight 208 lb 9.6 oz (94.6 kg), SpO2 98 %. Body mass index is 35.61 kg/m.   Physical Exam:  General: Alert, interactive, in no acute distress. Pleasant female. Very effable.  Eyes: No conjunctival injection bilaterally, no discharge on the right, no discharge on the left and no  Horner-Trantas dots present. PERRL bilaterally. EOMI without pain. No photophobia.  Ears: Right TM pearly gray with normal light reflex, Left TM pearly gray with normal light reflex, Right TM intact without perforation and Left TM intact without perforation.  Nose/Throat: External nose within normal limits and septum midline. Turbinates edematous and pale with clear discharge. Posterior  oropharynx mildly erythematous without cobblestoning in the posterior oropharynx. Tonsils 2+ without exudates.  Tongue without thrush. Neck: Supple without thyromegaly. Trachea midline. Adenopathy: shoddy bilateral anterior cervical lymphadenopathy and no enlarged lymph nodes appreciated in the occipital, axillary, epitrochlear, inguinal, or popliteal regions. Lungs: Clear to auscultation without wheezing, rhonchi or rales. No increased work of breathing. CV: Normal S1/S2. No murmurs. Capillary refill <2 seconds.  Abdomen: Nondistended, nontender. No guarding or rebound tenderness. Bowel sounds present in all fields and hypoactive  Skin: Warm and dry, without lesions or rashes. Extremities:  No clubbing, cyanosis or edema. Neuro:   Grossly intact. No focal deficits appreciated. Responsive to questions.  Diagnostic studies: none       Salvatore Marvel, MD [facility]

## 2017-07-31 NOTE — Patient Instructions (Addendum)
1. Chronic rhinitis - We will send some blood work to look for environmental allergies. - In the meantime, continue with fluticasone 1-2 sprays per nostril daily as well in conjunction with Zyrtec 10mg  daily as needed.  2. Insect bite - We will get a stinging insect panel today as well as a tryptase. - EpiPen is up to date. - We will call you in 1-2 weeks with the results.   3. Return in about 3 months (around 10/31/2017).   Please inform us of any Emergency Department visits, hospitalizations, or changes in symptoms. Call us before going to the ED for breathing or allergy symptoms since we might be able to fit you in for a sick visit. Feel free to contact us anytime with any questions, problems, or concerns.  It was a pleasure to meet you today! Enjoy the fall season! 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   Websites that have reliable patient information: 1. American Academy of Asthma, Allergy, and Immunology: www.aaaai.org 2. Food Allergy Research and Education (FARE): foodallergy.org 3. Mothers of Asthmatics: http://www.asthmacommunitynetwork.org 4. American College of Allergy, Asthma, and Immunology: www.acaai.org

## 2017-08-02 LAB — MISC LABCORP TEST (SEND OUT)
LABCORP TEST CODE: 602627
Labcorp test code: 671871

## 2017-08-02 LAB — TRYPTASE: Tryptase: 9.4 ug/L (ref 2.2–13.2)

## 2017-08-03 LAB — LIPID PANEL
CHOL/HDL RATIO: 1.8 (calc) (ref <5.0)
CHOLESTEROL: 150 mg/dL (ref <200)
HDL: 83 mg/dL (ref >50)
LDL CHOLESTEROL (CALC): 51 mg/dL
Non-HDL Cholesterol (Calc): 67 mg/dL (calc) (ref <130)
TRIGLYCERIDES: 76 mg/dL (ref <150)

## 2017-08-03 LAB — COMPLETE METABOLIC PANEL WITH GFR
AG Ratio: 1.2 (calc) (ref 1.0–2.5)
ALKALINE PHOSPHATASE (APISO): 102 U/L (ref 33–130)
ALT: 40 U/L — AB (ref 6–29)
AST: 32 U/L (ref 10–35)
Albumin: 3.8 g/dL (ref 3.6–5.1)
BUN: 12 mg/dL (ref 7–25)
CALCIUM: 8.9 mg/dL (ref 8.6–10.4)
CO2: 29 mmol/L (ref 20–32)
CREATININE: 0.7 mg/dL (ref 0.50–0.99)
Chloride: 103 mmol/L (ref 98–110)
GFR, EST NON AFRICAN AMERICAN: 92 mL/min/{1.73_m2} (ref >=60)
GFR, Est African American: 106 mL/min/{1.73_m2} (ref >=60)
GLUCOSE: 92 mg/dL (ref 65–99)
Globulin: 3.1 g/dL (calc) (ref 1.9–3.7)
Potassium: 3.9 mmol/L (ref 3.5–5.3)
SODIUM: 141 mmol/L (ref 135–146)
Total Bilirubin: 0.4 mg/dL (ref 0.2–1.2)
Total Protein: 6.9 g/dL (ref 6.1–8.1)

## 2017-08-03 LAB — VITAMIN D 25 HYDROXY (VIT D DEFICIENCY, FRACTURES): Vit D, 25-Hydroxy: 27 ng/mL — ABNORMAL LOW (ref 30–100)

## 2017-08-06 ENCOUNTER — Encounter: Payer: Self-pay | Admitting: Family Medicine

## 2017-08-06 ENCOUNTER — Ambulatory Visit (INDEPENDENT_AMBULATORY_CARE_PROVIDER_SITE_OTHER): Payer: BC Managed Care – PPO | Admitting: Family Medicine

## 2017-08-06 VITALS — BP 118/78 | HR 112 | Resp 16 | Ht 64.0 in | Wt 208.0 lb

## 2017-08-06 DIAGNOSIS — I10 Essential (primary) hypertension: Secondary | ICD-10-CM | POA: Diagnosis not present

## 2017-08-06 DIAGNOSIS — F418 Other specified anxiety disorders: Secondary | ICD-10-CM | POA: Diagnosis not present

## 2017-08-06 DIAGNOSIS — Z23 Encounter for immunization: Secondary | ICD-10-CM

## 2017-08-06 DIAGNOSIS — R7303 Prediabetes: Secondary | ICD-10-CM | POA: Diagnosis not present

## 2017-08-06 DIAGNOSIS — F419 Anxiety disorder, unspecified: Secondary | ICD-10-CM

## 2017-08-06 DIAGNOSIS — E785 Hyperlipidemia, unspecified: Secondary | ICD-10-CM

## 2017-08-06 DIAGNOSIS — F5105 Insomnia due to other mental disorder: Secondary | ICD-10-CM

## 2017-08-06 DIAGNOSIS — R232 Flushing: Secondary | ICD-10-CM | POA: Diagnosis not present

## 2017-08-06 LAB — HM DIABETES EYE EXAM

## 2017-08-06 MED ORDER — AMLODIPINE BESYLATE 5 MG PO TABS: ORAL_TABLET | ORAL | 1 refills | Status: DC

## 2017-08-06 MED ORDER — METFORMIN HCL 1000 MG PO TABS: ORAL_TABLET | ORAL | 1 refills | Status: DC

## 2017-08-06 MED ORDER — FLUOXETINE HCL 20 MG PO TABS: 20.0000 mg | ORAL_TABLET | Freq: Every day | ORAL | 1 refills | Status: DC

## 2017-08-06 MED ORDER — CARVEDILOL 6.25 MG PO TABS: ORAL_TABLET | ORAL | 1 refills | Status: DC

## 2017-08-06 MED ORDER — VENLAFAXINE HCL ER 75 MG PO CP24: ORAL_CAPSULE | ORAL | 1 refills | Status: DC

## 2017-08-06 NOTE — Patient Instructions (Addendum)
F/u in 4.5 months, call if  You need me before  TdAP and flu vaccine today  BP is excellent   Medications remain the same, I will contact  You re the amlodipine   It is important that you exercise regularly at least 30 minutes 5 times a week. If you develop chest pain, have severe difficulty breathing, or feel very tired, stop exercising immediately and seek medical attention    Please work on good  health habits so that your health will improve. 1. Commitment to daily physical activity for 30 to 60  minutes, if you are able to do this.  2. Commitment to wise food choices. Aim for half of your  food intake to be vegetable and fruit, one quarter starchy foods, and one quarter protein. Try to eat on a regular schedule  3 meals per day, snacking between meals should be limited to vegetables or fruits or small portions of nuts. 64 ounces of water per day is generally recommended, unless you have specific health conditions, like heart failure or kidney failure where you will need to limit fluid intake.  3. Commitment to sufficient and a  good quality of physical and mental rest daily, generally between 6 to 8 hours per day.  WITH PERSISTANCE AND PERSEVERANCE, THE IMPOSSIBLE , BECOMES THE NORM!   Fatty liver management is with weight loss  Quiet time and solitary space is imoportant, enjoy!

## 2017-08-12 NOTE — Assessment & Plan Note (Signed)
Hyperlipidemia:Low fat diet discussed and encouraged.   Lipid Panel  Lab Results  Component Value Date   CHOL 150 08/03/2017   HDL 83 08/03/2017   LDLCALC 59 03/28/2017   TRIG 76 08/03/2017   CHOLHDL 1.8 08/03/2017   Controlled, no change in medication

## 2017-08-12 NOTE — Assessment & Plan Note (Signed)
Sleep hygiene reviewed and written information offered also. Prescription sent for  medication needed.  

## 2017-08-12 NOTE — Assessment & Plan Note (Signed)
Controlled, no change in medication  

## 2017-08-12 NOTE — Assessment & Plan Note (Signed)
Patient educated about the importance of limiting  Carbohydrate intake , the need to commit to daily physical activity for a minimum of 30 minutes , and to commit weight loss. The fact that changes in all these areas will reduce or eliminate all together the development of diabetes is stressed.  Continue metformin  Diabetic Labs Latest Ref Rng & Units 08/03/2017 03/28/2017 09/25/2016 05/23/2016 11/15/2015  HbA1c <5.7 % - 5.9(H) 5.7(H) 5.8(H) 6.0(H)  Chol <200 mg/dL 150 150 140 143 151  HDL >50 mg/dL 83 72 70 77 82  Calc LDL <100 mg/dL - 59 57 49 55  Triglycerides <150 mg/dL 76 94 64 87 71  Creatinine 0.50 - 0.99 mg/dL 0.70 0.79 0.73 0.81 0.87   BP/Weight 08/06/2017 07/31/2017 05/08/2017 04/02/2017 01/31/2017 11/30/2016 8/63/8177  Systolic BP 116 579 038 333 832 919 166  Diastolic BP 78 75 80 80 90 80 76  Wt. (Lbs) 208 208.6 - 206.25 207 204 200  BMI 35.7 35.61 - 35.4 35.53 35.02 34.33   Foot/eye exam completion dates Latest Ref Rng & Units 08/06/2017 07/05/2016  Eye Exam No Retinopathy No Retinopathy No Retinopathy  Foot Form Completion - - -

## 2017-08-12 NOTE — Progress Notes (Signed)
Alexis Hurst     MRN: [id number]      DOB: [birth year]   HPI Ms. Hoel is here for follow up and re-evaluation of chronic medical conditions, medication management and review of any available recent lab and radiology data.  Preventive health is updated, specifically  Cancer screening and Immunization.   Questions or concerns regarding consultations or procedures which the PT has had in the interim are  addressed. The PT denies any adverse reactions to current medications since the last visit.  There are no new concerns.  There are no specific complaints   ROS Denies recent fever or chills. Denies sinus pressure, nasal congestion, ear pain or sore throat. Denies chest congestion, productive cough or wheezing. Denies chest pains, palpitations and leg swelling Denies abdominal pain, nausea, vomiting,diarrhea or constipation.   Denies dysuria, frequency, hesitancy or incontinence. Denies joint pain, swelling and limitation in mobility. Denies headaches, seizures, numbness, or tingling. Denies depression, c/o increased  anxiety denies  Insomnia.on current medication Denies skin break down or rash.   PE  BP 118/78   Pulse (!) 112   Resp 16   Ht 5\' 4"  (1.626 m)   Wt 208 lb (94.3 kg)   SpO2 98%   BMI 35.70 kg/m   Patient alert and oriented and in no cardiopulmonary distress.  HEENT: No facial asymmetry, EOMI,   oropharynx pink and moist.  Neck supple no JVD, no mass.  Chest: Clear to auscultation bilaterally.  CVS: S1, S2 no murmurs, no S3.Regular rate.  ABD: Soft non tender.   Ext: No edema  MS: Adequate ROM spine, shoulders, hips and knees.  Skin: Intact, no ulcerations or rash noted.  Psych: Good eye contact, normal affect. Memory intact not anxious or depressed appearing.  CNS: CN 2-12 intact, power,  normal throughout.no focal deficits noted.   Assessment & Plan  Essential hypertension Controlled, no change in medication DASH diet and commitment to daily  physical activity for a minimum of 30 minutes discussed and encouraged, as a part of hypertension management. The importance of attaining a healthy weight is also discussed.  BP/Weight 08/06/2017 07/31/2017 05/08/2017 04/02/2017 01/31/2017 11/30/2016 3/00/7622  Systolic BP 633 354 562 563 893 734 287  Diastolic BP 78 75 80 80 90 80 76  Wt. (Lbs) 208 208.6 - 206.25 207 204 200  BMI 35.7 35.61 - 35.4 35.53 35.02 34.33       Hot flashes Controlled, no change in medication   Prediabetes Patient educated about the importance of limiting  Carbohydrate intake , the need to commit to daily physical activity for a minimum of 30 minutes , and to commit weight loss. The fact that changes in all these areas will reduce or eliminate all together the development of diabetes is stressed.  Continue metformin  Diabetic Labs Latest Ref Rng & Units 08/03/2017 03/28/2017 09/25/2016 05/23/2016 11/15/2015  HbA1c <5.7 % - 5.9(H) 5.7(H) 5.8(H) 6.0(H)  Chol <200 mg/dL 150 150 140 143 151  HDL >50 mg/dL 83 72 70 77 82  Calc LDL <100 mg/dL - 59 57 49 55  Triglycerides <150 mg/dL 76 94 64 87 71  Creatinine 0.50 - 0.99 mg/dL 0.70 0.79 0.73 0.81 0.87   BP/Weight 08/06/2017 07/31/2017 05/08/2017 04/02/2017 01/31/2017 11/30/2016 6/81/1572  Systolic BP 620 355 974 163 845 364 680  Diastolic BP 78 75 80 80 90 80 76  Wt. (Lbs) 208 208.6 - 206.25 207 204 200  BMI 35.7 35.61 - 35.4 35.53 35.02 34.33  Foot/eye exam completion dates Latest Ref Rng & Units 08/06/2017 07/05/2016  Eye Exam No Retinopathy No Retinopathy No Retinopathy  Foot Form Completion - - -      Hyperlipidemia LDL goal <100 Hyperlipidemia:Low fat diet discussed and encouraged.   Lipid Panel  Lab Results  Component Value Date   CHOL 150 08/03/2017   HDL 83 08/03/2017   LDLCALC 59 03/28/2017   TRIG 76 08/03/2017   CHOLHDL 1.8 08/03/2017   Controlled, no change in medication     Depression with anxiety Controlled with medications Increased  stress and anxiety , her daughter and 3 grandchildren have moved in and live with her, her son has many children, is often incarcerated and is irresponsible, in her words, "I am surviving" Copes overall well despite never going to therapy uses her faith and small support group  Hyposomnia, insomnia or sleeplessness associated with anxiety Sleep hygiene reviewed and written information offered also. Prescription sent for  medication needed.   Need for Tdap vaccination After obtaining informed consent, the vaccine is  administered by LPN.

## 2017-08-12 NOTE — Assessment & Plan Note (Signed)
Controlled with medications Increased stress and anxiety , her daughter and 3 grandchildren have moved in and live with her, her son has many children, is often incarcerated and is irresponsible, in her words, "I am surviving" Copes overall well despite never going to therapy uses her faith and small support group

## 2017-08-12 NOTE — Assessment & Plan Note (Signed)
After obtaining informed consent, the vaccine is  administered by LPN.  

## 2017-08-12 NOTE — Assessment & Plan Note (Signed)
Controlled, no change in medication DASH diet and commitment to daily physical activity for a minimum of 30 minutes discussed and encouraged, as a part of hypertension management. The importance of attaining a healthy weight is also discussed.  BP/Weight 08/06/2017 07/31/2017 05/08/2017 04/02/2017 01/31/2017 11/30/2016 09/09/2692  Systolic BP 854 627 035 009 381 829 937  Diastolic BP 78 75 80 80 90 80 76  Wt. (Lbs) 208 208.6 - 206.25 207 204 200  BMI 35.7 35.61 - 35.4 35.53 35.02 34.33

## 2017-08-16 ENCOUNTER — Telehealth: Payer: Self-pay | Admitting: Family Medicine

## 2017-08-16 NOTE — Telephone Encounter (Signed)
Patient left message on nurse line regarding a possible cold. She states she is coughing a lot but is more concerned with the way her chest feels. She states it feels like her cold is settling in her chest and she can hear a wheeze. She would like an appointment today.  Callback# [phone]

## 2017-08-20 NOTE — Telephone Encounter (Signed)
Called Alexis Hurst, states she went to urgent care today and " got a shot"  FYI

## 2017-08-21 ENCOUNTER — Ambulatory Visit (INDEPENDENT_AMBULATORY_CARE_PROVIDER_SITE_OTHER): Payer: BC Managed Care – PPO | Admitting: Family Medicine

## 2017-08-21 ENCOUNTER — Telehealth: Payer: Self-pay | Admitting: Family Medicine

## 2017-08-21 ENCOUNTER — Ambulatory Visit (HOSPITAL_COMMUNITY)
Admission: RE | Admit: 2017-08-21 | Discharge: 2017-08-21 | Disposition: A | Discharge status: Home / Self Care | Payer: BC Managed Care – PPO | Source: Ambulatory Visit | Attending: Family Medicine | Admitting: Family Medicine

## 2017-08-21 ENCOUNTER — Encounter: Payer: Self-pay | Admitting: Family Medicine

## 2017-08-21 VITALS — BP 140/80 | HR 101 | Temp 98.7°F | Resp 16 | Ht 64.0 in | Wt 206.0 lb

## 2017-08-21 DIAGNOSIS — J309 Allergic rhinitis, unspecified: Secondary | ICD-10-CM | POA: Diagnosis not present

## 2017-08-21 DIAGNOSIS — I10 Essential (primary) hypertension: Secondary | ICD-10-CM

## 2017-08-21 DIAGNOSIS — R918 Other nonspecific abnormal finding of lung field: Secondary | ICD-10-CM | POA: Insufficient documentation

## 2017-08-21 DIAGNOSIS — J209 Acute bronchitis, unspecified: Secondary | ICD-10-CM

## 2017-08-21 MED ORDER — FLUCONAZOLE 150 MG PO TABS
ORAL_TABLET | ORAL | 0 refills | Status: DC
Start: 2017-08-21 — End: 2018-02-07

## 2017-08-21 MED ORDER — CEFTRIAXONE SODIUM 500 MG IJ SOLR
500.0000 mg | Freq: Once | INTRAMUSCULAR | Status: AC
  Administered 2017-08-21: 500 mg via INTRAMUSCULAR

## 2017-08-21 MED ORDER — PROMETHAZINE-DM 6.25-15 MG/5ML PO SYRP: ORAL_SOLUTION | ORAL | 0 refills | Status: DC

## 2017-08-21 MED ORDER — LEVOFLOXACIN 500 MG PO TABS: 500.0000 mg | ORAL_TABLET | Freq: Every day | ORAL | 0 refills | Status: DC

## 2017-08-21 NOTE — Telephone Encounter (Signed)
Patient's daughter called in stating patient was very sick, dry cough, runny nose, no fever but has been sick since thurs. Of last week. She went to Urgent care in [place] Saturday and received a shot and some tessalon  pearls for cough but as of now no relief. Cb#: [phone]

## 2017-08-21 NOTE — Progress Notes (Signed)
   Alexis Hurst     MRN: [id number]      DOB: June 26, 1951   HPI Alexis Hurst is  1 week h/o worsening head and chest congestion, associated with fever and chills intermittently. Nasal drainage has thickened , and is yellowish green, and at times bloody. Sputum is thick and yellow. C/o bilateral ear pressure, denies hearing loss and sore throat. Increasing fatigue , poor appetitie and sleep disturbed by cough. No improvement with OTC medication.   ROS See HPI  Denies abdominal pain, nausea, vomiting,diarrhea or constipation.   Denies dysuria, frequency, hesitancy or incontinence. Denies joint pain, swelling and limitation in mobility. Denies headaches, seizures, numbness, or tingling. Denies depression, anxiety or insomnia. Denies skin break down or rash.   PE  BP 140/80   Pulse (!) 101   Temp 98.7 F (37.1 C) (Oral)   Resp 16   Ht 5\' 4"  (1.626 m)   Wt 206 lb (93.4 kg)   SpO2 95%   BMI 35.36 kg/m   Patient alert and oriented and in no cardiopulmonary distress.  HEENT: No facial asymmetry, EOMI,   oropharynx pink and moist.  Neck supple no JVD, no mass.Bilateral maxillary sinus tenderness, TM clear, bilateral anterior  Cervical adenitis  Chest: Decreased air entry, scattered crackles , no wheezes CVS: S1, S2 no murmurs, no S3.Regular rate.  ABD: Soft non tender.   Ext: No edema  MS: Adequate ROM spine, shoulders, hips and knees.  Skin: Intact, no ulcerations or rash noted.  Psych: Good eye contact, normal affect. Memory intact not anxious or depressed appearing.  CNS: CN 2-12 intact, power,  normal throughout.no focal deficits noted.   Assessment & Plan  Bronchitis, acute Rocephin 500 mg IM in office followed by 10 day course of Levaquin and phenergan DM at bedtime, continue tessalon Perles as before, rest and fluids. CXR today  Allergic sinusitis Levaquin prescribed and Rocephin administered in office  Essential hypertension Elevated BP at visit ,  however , she has been using a lot of oTC decongestants, no med change DASH diet and commitment to daily physical activity for a minimum of 30 minutes discussed and encouraged, as a part of hypertension management. The importance of attaining a healthy weight is also discussed.  BP/Weight 08/21/2017 08/06/2017 07/31/2017 05/08/2017 04/02/2017 01/31/2017 8/46/6599  Systolic BP 357 017 793 903 009 233 007  Diastolic BP 80 78 75 80 80 90 80  Wt. (Lbs) 206 208 208.6 - 206.25 207 204  BMI 35.36 35.7 35.61 - 35.4 35.53 35.02       FATIGUE Increased with current illness, advised rest , fluids and medications

## 2017-08-21 NOTE — Patient Instructions (Addendum)
F/u as before, call you need me sooner  Rocephin 500 mg IM in office today  I will send the CXR report to my chart , and if  You do have pneumonia I will make a special effort to directly  speak with you tomorrow  Levaquin and a cough suppressant syrup are sent to your pharmacy, also fluconazole  REST, fluids and meds      Acute Bronchitis, Adult Acute bronchitis is when air tubes (bronchi) in the lungs suddenly get swollen. The condition can make it hard to breathe. It can also cause these symptoms:  A cough.  Coughing up clear, yellow, or green mucus.  Wheezing.  Chest congestion.  Shortness of breath.  A fever.  Body aches.  Chills.  A sore throat.  Follow these instructions at home: Medicines  Take over-the-counter and prescription medicines only as told by your doctor.  If you were prescribed an antibiotic medicine, take it as told by your doctor. Do not stop taking the antibiotic even if you start to feel better. General instructions  Rest.  Drink enough fluids to keep your pee (urine) clear or pale yellow.  Avoid smoking and secondhand smoke. If you smoke and you need help quitting, ask your doctor. Quitting will help your lungs heal faster.  Use an inhaler, cool mist vaporizer, or humidifier as told by your doctor.  Keep all follow-up visits as told by your doctor. This is important. How is this prevented? To lower your risk of getting this condition again:  Wash your hands often with soap and water. If you cannot use soap and water, use hand sanitizer.  Avoid contact with people who have cold symptoms.  Try not to touch your hands to your mouth, nose, or eyes.  Make sure to get the flu shot every year.  Contact a doctor if:  Your symptoms do not get better in 2 weeks. Get help right away if:  You cough up blood.  You have chest pain.  You have very bad shortness of breath.  You become dehydrated.  You faint (pass out) or keep feeling  like you are going to pass out.  You keep throwing up (vomiting).  You have a very bad headache.  Your fever or chills gets worse. This information is not intended to replace advice given to you by your health care provider. Make sure you discuss any questions you have with your health care provider. Document Released: 02/07/2008 Document Revised: 03/29/2016 Document Reviewed: 02/09/2016 Elsevier Interactive Patient Education  2017 [employer].

## 2017-08-21 NOTE — Telephone Encounter (Signed)
Worked in today

## 2017-08-21 NOTE — Assessment & Plan Note (Addendum)
Rocephin 500 mg IM in office followed by 10 day course of Levaquin and phenergan DM at bedtime, continue tessalon Perles as before, rest and fluids. CXR today

## 2017-08-23 ENCOUNTER — Other Ambulatory Visit: Payer: Self-pay | Admitting: Family Medicine

## 2017-08-23 DIAGNOSIS — J189 Pneumonia, unspecified organism: Secondary | ICD-10-CM

## 2017-08-26 ENCOUNTER — Encounter: Payer: Self-pay | Admitting: Family Medicine

## 2017-08-26 NOTE — Assessment & Plan Note (Signed)
Elevated BP at visit , however , she has been using a lot of oTC decongestants, no med change DASH diet and commitment to daily physical activity for a minimum of 30 minutes discussed and encouraged, as a part of hypertension management. The importance of attaining a healthy weight is also discussed.  BP/Weight 08/21/2017 08/06/2017 07/31/2017 05/08/2017 04/02/2017 01/31/2017 5/52/0802  Systolic BP 233 612 244 975 300 511 021  Diastolic BP 80 78 75 80 80 90 80  Wt. (Lbs) 206 208 208.6 - 206.25 207 204  BMI 35.36 35.7 35.61 - 35.4 35.53 35.02

## 2017-08-26 NOTE — Assessment & Plan Note (Signed)
Increased with current illness, advised rest , fluids and medications

## 2017-08-26 NOTE — Assessment & Plan Note (Signed)
Levaquin prescribed and Rocephin administered in office

## 2017-10-30 ENCOUNTER — Encounter: Payer: Self-pay | Admitting: Allergy & Immunology

## 2017-10-30 ENCOUNTER — Ambulatory Visit (INDEPENDENT_AMBULATORY_CARE_PROVIDER_SITE_OTHER): Payer: BC Managed Care – PPO | Admitting: Allergy & Immunology

## 2017-10-30 VITALS — BP 118/76 | HR 97 | Resp 18

## 2017-10-30 DIAGNOSIS — J310 Chronic rhinitis: Secondary | ICD-10-CM | POA: Diagnosis not present

## 2017-10-30 DIAGNOSIS — T6391XD Toxic effect of contact with unspecified venomous animal, accidental (unintentional), subsequent encounter: Secondary | ICD-10-CM

## 2017-10-30 NOTE — Progress Notes (Signed)
FOLLOW UP  Date of Service/Encounter:  10/30/17   Assessment:   Non-allergic rhinitis  Venom-induced anaphylaxis  Plan/Recommendations:   1. Non-allergy rhinitis - Continue with Nasacort 1-2 sprays per nostril daily as needed. - Continue with Zyrtec 10mg  daily as needed. - Samples of Allegra and Xyzal provided to see if these work better.   2. Anaphylaxis (honeybee, hornet, wasp, and yellow jacket) - We could consider venom immunotherapy in the future as a means of protecting you from anaphylaxis. - Consider this in the future.  - In the meantime, try to avoid stinging insects as best you can.  - EpiPen is up to date.  3. Return in about 1 year (around 10/30/2018).   Subjective:   Alexis Hurst is a 67 y.o. female presenting today for follow up of  Chief Complaint  Patient presents with  . Allergic Rhinitis   . Follow-up  . Results    Alexis Hurst has a history of the following: Patient Active Problem List   Diagnosis Date Noted  . Venom-induced anaphylaxis 10/31/2017  . Non-allergic rhinitis 10/31/2017  . Allergic urticaria 04/02/2017  . Transaminitis 04/02/2017  . Hot flashes 09/27/2016  . Metabolic syndrome X 16/06/9603  . 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 Piles (hemorrhoids) 07/20/2015  . Hyperlipidemia LDL goal <100 02/15/2015  . Diverticulosis 12/11/2013  . Bronchitis, acute 07/25/2013  . FH: colon cancer 04/30/2013  . DDD (degenerative disc disease), lumbosacral 01/21/2013  . Allergic sinusitis 12/18/2012  . Prediabetes 05/28/2011  . ARTHRITIS 04/11/2010  . Palpitations 06/08/2009  . FATIGUE 02/26/2009  . Obesity (BMI 30.0-34.9) 02/04/2008  . ANXIETY 02/04/2008  . Depression with anxiety 02/04/2008  . Essential hypertension 02/04/2008  . Hyposomnia, insomnia or sleeplessness associated with anxiety 02/04/2008    History obtained from: chart review and patient.  [facility] Primary Care Provider is Alexis Helper, MD.      Alexis Hurst is a 67 y.o. female presenting for a follow up visit. She was last seen in November 2018. At that time, she was endorsing problems with exaggerated reactions to insect bites, including marked swelling that passed beyond joints. These episodes were responsive to cetirizine. She had no systemic symptoms with these. She also endorsed a history of seasonal allergies which are worse in the spring. We obtained an environmental allergy panel which was negative as well as a stinging insect panel that demonstrated very low positives to honeybee, hornet, yellow jacket, paper wasp, and yellow hornet. I recommended avoidance and discussed immunotherapy, but she declined to pursue immunotherapy at that time.   Since the last visit, she has done very well. She has been using the cetirizine on a fairly regular basis but is not using the Nasacort very often. She is unsure how well the cetirizine is working for her symptoms. She does not think that she has ever tried Xyzal, but she has been on Allegra in the past.   She remains uninterested in venom immunotherapy. She does keep her EpiPen with her at all times in case of emergencies. Otherwise, there have been no changes to her past medical history, surgical history, family history, or social history.    Review of Systems: a 14-point review of systems is pertinent for what is mentioned in HPI.  Otherwise, all other systems were negative. Constitutional: negative other than that listed in the HPI Eyes: negative other than that listed in the HPI Ears, nose, mouth, throat, and face: negative other than that listed in the HPI  Respiratory: negative other than that listed in the HPI Cardiovascular: negative other than that listed in the HPI Gastrointestinal: negative other than that listed in the HPI Genitourinary: negative other than that listed in the HPI Integument: negative other than that listed in the HPI Hematologic: negative other than that listed in  the HPI Musculoskeletal: negative other than that listed in the HPI Neurological: negative other than that listed in the HPI Allergy/Immunologic: negative other than that listed in the HPI    Objective:   Blood pressure 118/76, pulse 97, resp. rate 18, SpO2 96 %. There is no height or weight on file to calculate BMI.   Physical Exam:  General: Alert, interactive, in no acute distress. Pleasant female.  Eyes: No conjunctival injection bilaterally, no discharge on the right, no discharge on the left and no Horner-Trantas dots present. PERRL bilaterally. EOMI without pain. No photophobia.  Ears: Right TM pearly gray with normal light reflex, Left TM pearly gray with normal light reflex, Right TM intact without perforation and Left TM intact without perforation.  Nose/Throat: External nose within normal limits and septum midline. Turbinates edematous and pale with clear discharge. Posterior oropharynx mildly erythematous without cobblestoning in the posterior oropharynx. Tonsils 2+ without exudates.  Tongue without thrush. Lungs: Clear to auscultation without wheezing, rhonchi or rales. No increased work of breathing. CV: Normal S1/S2. No murmurs. Capillary refill <2 seconds.  Skin: Warm and dry, without lesions or rashes. Neuro:   Grossly intact. No focal deficits appreciated. Responsive to questions.  Diagnostic studies: none     Alexis Marvel, MD [facility]

## 2017-10-30 NOTE — Patient Instructions (Addendum)
1. Non-allergy rhinitis - Continue with Nasacort 1-2 sprays per nostril daily as needed. - Continue with Zyrtec 10mg  daily as needed. - Samples of Allegra and Xyzal provided to see if these work better.   2. Anaphylaxis (honeybee, hornet, wasp, and yellow jacket) - We could consider venom immunotherapy in the future as a means of protecting you from anaphylaxis. - Consider this in the future.  - In the meantime, try to avoid stinging insects as best you can.  - EpiPen is up to date.  3. Return in about 1 year (around 10/30/2018).   Please inform us of any Emergency Department visits, hospitalizations, or changes in symptoms. Call us before going to the ED for breathing or allergy symptoms since we might be able to fit you in for a sick visit. Feel free to contact us anytime with any questions, problems, or concerns.  It was a pleasure to see you again today! Enjoy the spring season!   Websites that have reliable patient information: 1. American Academy of Asthma, Allergy, and Immunology: www.aaaai.org 2. Food Allergy Research and Education (FARE): foodallergy.org 3. Mothers of Asthmatics: http://www.asthmacommunitynetwork.org 4. American College of Allergy, Asthma, and Immunology: www.acaai.org

## 2017-10-31 ENCOUNTER — Encounter: Payer: Self-pay | Admitting: Allergy & Immunology

## 2017-10-31 DIAGNOSIS — J310 Chronic rhinitis: Secondary | ICD-10-CM

## 2017-10-31 DIAGNOSIS — T6391XA Toxic effect of contact with unspecified venomous animal, accidental (unintentional), initial encounter: Secondary | ICD-10-CM | POA: Insufficient documentation

## 2017-10-31 HISTORY — DX: Chronic rhinitis: J31.0

## 2017-11-01 ENCOUNTER — Telehealth: Payer: Self-pay | Admitting: Family Medicine

## 2017-11-01 ENCOUNTER — Other Ambulatory Visit: Payer: Self-pay | Admitting: Family Medicine

## 2017-11-01 MED ORDER — OSELTAMIVIR PHOSPHATE 75 MG PO CAPS: 75.0000 mg | ORAL_CAPSULE | Freq: Two times a day (BID) | ORAL | 0 refills | Status: DC

## 2017-11-01 NOTE — Telephone Encounter (Signed)
Patient's daughter left message on nurse line stating that one of the twins was diagnosed last night in the ED with flu A, and she is asking for rx Tamiflu. She uses [facility]

## 2017-11-01 NOTE — Telephone Encounter (Signed)
Med sent and her daughter is aware to [facility] which is the pharmacy

## 2017-12-05 ENCOUNTER — Telehealth: Payer: Self-pay | Admitting: Family Medicine

## 2017-12-05 ENCOUNTER — Ambulatory Visit: Payer: Self-pay | Admitting: Family Medicine

## 2017-12-05 NOTE — Telephone Encounter (Signed)
Patient requesting you to work her in for a sick appt " feels bad with cough"  No more sick visits today with any doctor.  I offered an appt for tomorrow with Dr.Nelson patient said she would rather you address Dr.Simpson and "squeeze her into your schedule."   Cb#: [phone]

## 2017-12-06 ENCOUNTER — Ambulatory Visit (INDEPENDENT_AMBULATORY_CARE_PROVIDER_SITE_OTHER): Payer: BC Managed Care – PPO | Admitting: Family Medicine

## 2017-12-06 ENCOUNTER — Other Ambulatory Visit: Payer: Self-pay

## 2017-12-06 ENCOUNTER — Encounter: Payer: Self-pay | Admitting: Family Medicine

## 2017-12-06 VITALS — BP 120/58 | HR 102 | Temp 99.2°F | Ht 64.0 in | Wt 209.0 lb

## 2017-12-06 DIAGNOSIS — J111 Influenza due to unidentified influenza virus with other respiratory manifestations: Secondary | ICD-10-CM | POA: Diagnosis not present

## 2017-12-06 MED ORDER — AZITHROMYCIN 250 MG PO TABS: ORAL_TABLET | ORAL | 0 refills | Status: DC

## 2017-12-06 MED ORDER — BENZONATATE 200 MG PO CAPS: 200.0000 mg | ORAL_CAPSULE | Freq: Three times a day (TID) | ORAL | 0 refills | Status: DC | PRN

## 2017-12-06 NOTE — Progress Notes (Signed)
Chief Complaint  Patient presents with  . Cough  . chest congestion  . Headache  . Nasal Congestion  Alexis Hurst is here to be evaluated for possible influenza. She states her daughter lives in the home with her with her 3 children.  The daughter came home with flu a week and a half ago.  This patient developed flu symptoms 5 days ago.  She felt well Saturday and Sunday could not get out of the bed with extreme body aches, fatigue, cough, fever or chills, and mild runny stuffy nose.  She is been coughing since that time.  She states that today she feels a little bit better than yesterday.  She is coughing green sputum.  She is very tired.  Yesterday 1 of the grandchildren was seen in the medical clinic and diagnosed with influenza.  Another grandchild is at the doctor today.  She is eating and drinking well.  No fever today.  She feels like she is recovering.  No chest pain.  No wheezing or tightness.  No underlying lung disease asthma or COPD.  Patient Active Problem List   Diagnosis Date Noted  . Venom-induced anaphylaxis 10/31/2017  . Non-allergic rhinitis 10/31/2017  . Allergic urticaria 04/02/2017  . Transaminitis 04/02/2017  . Hot flashes 09/27/2016  . Metabolic syndrome X 97/98/9211  . 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 Piles (hemorrhoids) 07/20/2015  . Hyperlipidemia LDL goal <100 02/15/2015  . Diverticulosis 12/11/2013  . Bronchitis, acute 07/25/2013  . FH: colon cancer 04/30/2013  . DDD (degenerative disc disease), lumbosacral 01/21/2013  . Allergic sinusitis 12/18/2012  . Prediabetes 05/28/2011  . ARTHRITIS 04/11/2010  . Palpitations 06/08/2009  . FATIGUE 02/26/2009  . Obesity (BMI 30.0-34.9) 02/04/2008  . ANXIETY 02/04/2008  . Depression with anxiety 02/04/2008  . Essential hypertension 02/04/2008  . Hyposomnia, insomnia or sleeplessness associated with anxiety 02/04/2008    Outpatient Encounter Medications as of 12/06/2017  Medication Sig  . amLODipine  (NORVASC) 5 MG tablet TAKE 1 TABLET(5 MG) BY MOUTH DAILY  . aspirin (ASPIRIN LOW DOSE) 81 MG EC tablet Take 81 mg by mouth daily.    Marland Kitchen atorvastatin (LIPITOR) 10 MG tablet Take 1 tablet (10 mg total) by mouth daily.  . Calcium Carbonate-Vitamin D (CALCIUM + D) 600-200 MG-UNIT TABS Take 1 tablet by mouth daily.   . carvedilol (COREG) 6.25 MG tablet TAKE 1 TABLET BY MOUTH TWICE DAILY WITH A MEAL  . fluconazole (DIFLUCAN) 150 MG tablet One tablet once daily , as needed, for vaginal itch associated with prolonged antibiotic use  . FLUoxetine (PROZAC) 20 MG tablet Take 1 tablet (20 mg total) by mouth daily.  Marland Kitchen glucose blood (ONE TOUCH ULTRA TEST) test strip Use as instructed once daily dx e11.9  . levofloxacin (LEVAQUIN) 500 MG tablet Take 1 tablet (500 mg total) by mouth daily.  . metFORMIN (GLUCOPHAGE) 1000 MG tablet TAKE 1 TABLET BY MOUTH EVERY DAY WITH BREAKFAST  . mometasone (NASONEX) 50 MCG/ACT nasal spray 2 sprays into each nostril daily  . Multiple Vitamin (MULTIVITAMIN WITH MINERALS) TABS tablet Take 1 tablet by mouth daily.  Marland Kitchen olmesartan-hydrochlorothiazide (BENICAR HCT) 20-12.5 MG tablet One half tablet once daily  . promethazine-dextromethorphan (PROMETHAZINE-DM) 6.25-15 MG/5ML syrup One teaspoon at bedtime, as needed, for excessive cough  . venlafaxine XR (EFFEXOR-XR) 75 MG 24 hr capsule TAKE 1 CAPSULE(75 MG) BY MOUTH DAILY  . vitamin B-12 (CYANOCOBALAMIN) 1000 MCG tablet Take 1,000 mcg by mouth daily.  Marland Kitchen zolpidem (AMBIEN CR) 12.5 MG  CR tablet TAKE 1 TABLET BY MOUTH EVERY DAY AT BEDTIME AS NEEDED FOR SLEEP  . azithromycin (ZITHROMAX Z-PAK) 250 MG tablet tad  . benzonatate (TESSALON) 200 MG capsule Take 1 capsule (200 mg total) by mouth 3 (three) times daily as needed for cough.   No facility-administered encounter medications on file as of 12/06/2017.     Allergies  Allergen Reactions  . Lactose Intolerance (Gi) Diarrhea and Nausea And Vomiting    Review of Systems  Constitutional:  Positive for chills, diaphoresis, fatigue and fever. Negative for activity change, appetite change and unexpected weight change.       Initially had all symptoms, now just fatigue remains  HENT: Positive for congestion, postnasal drip and rhinorrhea. Negative for dental problem, sinus pressure and sore throat.   Eyes: Negative for redness and visual disturbance.  Respiratory: Positive for cough. Negative for shortness of breath and wheezing.   Cardiovascular: Negative for chest pain, palpitations and leg swelling.  Gastrointestinal: Negative for abdominal pain, diarrhea, nausea and vomiting.  Genitourinary: Negative for difficulty urinating and frequency.  Musculoskeletal: Negative for arthralgias and back pain.  Neurological: Positive for headaches. Negative for dizziness.  Psychiatric/Behavioral: Negative for dysphoric mood and sleep disturbance. The patient is not nervous/anxious.     BP (!) 120/58   Pulse (!) 102   Temp 99.2 F (37.3 C) (Oral)   Ht 5\' 4"  (1.626 m)   Wt 209 lb 0.6 oz (94.8 kg)   SpO2 95%   BMI 35.88 kg/m   Physical Exam  Constitutional: She is oriented to person, place, and time. She appears well-developed and well-nourished. She appears ill.  Mildly ill, tired  HENT:  Head: Normocephalic and atraumatic.  Mouth/Throat: Oropharynx is clear and moist.  Eyes: Pupils are equal, round, and reactive to light. EOM are normal.  Neck: Normal range of motion.  Cardiovascular: Normal heart sounds.  Tachycardia  Pulmonary/Chest: Effort normal and breath sounds normal. She has no wheezes. She has no rales.  Abdominal: Soft. Bowel sounds are normal.  Musculoskeletal: Normal range of motion. She exhibits no edema.  Lymphadenopathy:    She has no cervical adenopathy.  Neurological: She is alert and oriented to person, place, and time.  Skin: Skin is warm and dry.  Psychiatric: She has a normal mood and affect. Her behavior is normal.    ASSESSMENT/PLAN:  1.  Influenza Discussed that they had influenza going through the household.  First the daughter, than her, now the grandchildren.  There is no point in testing her for influenza since she is out of the window to benefit from Tamiflu.  We discussed symptomatic care of the infection, fluids, over-the-counter medications, Tylenol or ibuprofen for pain and fever.  I am going to give her Tessalon for cough. The patient is worried that she is going to get worse instead of better and might need an antibiotic.  I explained her that since she is improving it is not likely to be necessary.  I am giving her a written prescription for a Z-Pak to take if she fails to improve, or has worsening symptoms.  I explained her that I did not think she had a bacterial infection that required antibiotics today   Patient Instructions  Rest Push fluids Take the tessalon to help control cough Take the antibiotic if you fail to improve  Call for problems   Raylene Everts, MD

## 2017-12-06 NOTE — Patient Instructions (Signed)
Rest Push fluids Take the tessalon to help control cough Take the antibiotic if you fail to improve  Call for problems

## 2017-12-06 NOTE — Telephone Encounter (Signed)
appt scheduled

## 2017-12-06 NOTE — Telephone Encounter (Signed)
No availability for me to work in any patient today, sorry!

## 2018-01-01 ENCOUNTER — Telehealth: Payer: Self-pay

## 2018-01-01 DIAGNOSIS — E785 Hyperlipidemia, unspecified: Secondary | ICD-10-CM

## 2018-01-01 DIAGNOSIS — I10 Essential (primary) hypertension: Secondary | ICD-10-CM

## 2018-01-01 DIAGNOSIS — R7303 Prediabetes: Secondary | ICD-10-CM

## 2018-01-01 LAB — LIPID PANEL
CHOL/HDL RATIO: 2.3 (calc) (ref <5.0)
Cholesterol: 171 mg/dL (ref <200)
HDL: 74 mg/dL (ref >50)
LDL CHOLESTEROL (CALC): 80 mg/dL
Non-HDL Cholesterol (Calc): 97 mg/dL (calc) (ref <130)
TRIGLYCERIDES: 90 mg/dL (ref <150)

## 2018-01-01 LAB — COMPLETE METABOLIC PANEL WITH GFR
AG Ratio: 1.2 (calc) (ref 1.0–2.5)
ALT: 31 U/L — AB (ref 6–29)
AST: 24 U/L (ref 10–35)
Albumin: 4.1 g/dL (ref 3.6–5.1)
Alkaline phosphatase (APISO): 100 U/L (ref 33–130)
BUN: 12 mg/dL (ref 7–25)
CHLORIDE: 102 mmol/L (ref 98–110)
CO2: 29 mmol/L (ref 20–32)
CREATININE: 0.81 mg/dL (ref 0.50–0.99)
Calcium: 9 mg/dL (ref 8.6–10.4)
GFR, EST AFRICAN AMERICAN: 89 mL/min/{1.73_m2} (ref >=60)
GFR, Est Non African American: 77 mL/min/{1.73_m2} (ref >=60)
GLUCOSE: 93 mg/dL (ref 65–99)
Globulin: 3.3 g/dL (calc) (ref 1.9–3.7)
Potassium: 3.7 mmol/L (ref 3.5–5.3)
Sodium: 140 mmol/L (ref 135–146)
TOTAL PROTEIN: 7.4 g/dL (ref 6.1–8.1)
Total Bilirubin: 0.5 mg/dL (ref 0.2–1.2)

## 2018-01-01 NOTE — Telephone Encounter (Signed)
Labs ordered.

## 2018-01-02 LAB — HEMOGLOBIN A1C
EAG (MMOL/L): 7.1 (calc)
HEMOGLOBIN A1C: 6.1 %{Hb} — AB (ref <5.7)
Mean Plasma Glucose: 128 (calc)

## 2018-01-03 ENCOUNTER — Ambulatory Visit: Payer: Self-pay | Admitting: Family Medicine

## 2018-01-06 ENCOUNTER — Encounter: Payer: Self-pay | Admitting: Family Medicine

## 2018-02-05 ENCOUNTER — Other Ambulatory Visit: Payer: Self-pay | Admitting: Family Medicine

## 2018-02-07 ENCOUNTER — Ambulatory Visit (INDEPENDENT_AMBULATORY_CARE_PROVIDER_SITE_OTHER): Payer: PRIVATE HEALTH INSURANCE | Admitting: Family Medicine

## 2018-02-07 ENCOUNTER — Encounter: Payer: Self-pay | Admitting: Family Medicine

## 2018-02-07 VITALS — BP 130/80 | HR 100 | Resp 16 | Ht 64.0 in | Wt 209.0 lb

## 2018-02-07 DIAGNOSIS — F418 Other specified anxiety disorders: Secondary | ICD-10-CM

## 2018-02-07 DIAGNOSIS — R7401 Elevation of levels of liver transaminase levels: Secondary | ICD-10-CM

## 2018-02-07 DIAGNOSIS — R7303 Prediabetes: Secondary | ICD-10-CM

## 2018-02-07 DIAGNOSIS — E785 Hyperlipidemia, unspecified: Secondary | ICD-10-CM | POA: Diagnosis not present

## 2018-02-07 DIAGNOSIS — E559 Vitamin D deficiency, unspecified: Secondary | ICD-10-CM

## 2018-02-07 DIAGNOSIS — E8881 Metabolic syndrome: Secondary | ICD-10-CM

## 2018-02-07 DIAGNOSIS — R740 Nonspecific elevation of levels of transaminase and lactic acid dehydrogenase [LDH]: Secondary | ICD-10-CM

## 2018-02-07 DIAGNOSIS — I10 Essential (primary) hypertension: Secondary | ICD-10-CM

## 2018-02-07 DIAGNOSIS — F419 Anxiety disorder, unspecified: Secondary | ICD-10-CM

## 2018-02-07 DIAGNOSIS — F5105 Insomnia due to other mental disorder: Secondary | ICD-10-CM

## 2018-02-07 MED ORDER — METFORMIN HCL 1000 MG PO TABS: ORAL_TABLET | ORAL | 1 refills | Status: DC

## 2018-02-07 MED ORDER — AMLODIPINE BESYLATE 5 MG PO TABS: ORAL_TABLET | ORAL | 1 refills | Status: DC

## 2018-02-07 MED ORDER — OLMESARTAN MEDOXOMIL-HCTZ 20-12.5 MG PO TABS: ORAL_TABLET | ORAL | 3 refills | Status: DC

## 2018-02-07 MED ORDER — CARVEDILOL 6.25 MG PO TABS: ORAL_TABLET | ORAL | 1 refills | Status: DC

## 2018-02-07 NOTE — Patient Instructions (Addendum)
[facility] to [employer] early October , call if you need me before   Tylenol uo to 2000 mg per day is oK, with your liver I would recommend maxing at 1000 per day, Naproxen 241m one per day is good for joint relief when there is a flare   Lab order will be given at checkout for all fasting labs to include CBC, TSH vit D, lipid, hBA1C, cmp and EGFR  Please work on good  health habits so that your health will improve. 1. Commitment to daily physical activity for 30 to 60  minutes, if you are able to do this.  2. Commitment to wise food choices. Aim for half of your  food intake to be vegetable and fruit, one quarter starchy foods, and one quarter protein. Try to eat on a regular schedule  3 meals per day, snacking between meals should be limited to vegetables or fruits or small portions of nuts. 64 ounces of water per day is generally recommended, unless you have specific health conditions, like heart failure or kidney failure where you will need to limit fluid intake.  3. Commitment to sufficient and a  good quality of physical and mental rest daily, generally between 6 to 8 hours per day.  WITH PERSISTANCE AND PERSEVERANCE, THE IMPOSSIBLE , BECOMES THE NORM! It is important that you exercise regularly at least 30 minutes 5 times a week. If you develop chest pain, have severe difficulty breathing, or feel very tired, stop exercising immediately and seek medical attention

## 2018-02-08 ENCOUNTER — Encounter: Payer: Self-pay | Admitting: Family Medicine

## 2018-02-08 MED ORDER — ZOLPIDEM TARTRATE ER 12.5 MG PO TBCR: 12.5000 mg | EXTENDED_RELEASE_TABLET | Freq: Every day | ORAL | 5 refills | Status: DC

## 2018-02-08 NOTE — Assessment & Plan Note (Signed)
Hyperlipidemia:Low fat diet discussed and encouraged.   Lipid Panel  Lab Results  Component Value Date   CHOL 171 01/01/2018   HDL 74 01/01/2018   LDLCALC 80 01/01/2018   TRIG 90 01/01/2018   CHOLHDL 2.3 01/01/2018   Controlled, no change in medication

## 2018-02-08 NOTE — Assessment & Plan Note (Signed)
Controlled, no change in medication DASH diet and commitment to daily physical activity for a minimum of 30 minutes discussed and encouraged, as a part of hypertension management. The importance of attaining a healthy weight is also discussed.  BP/Weight 02/07/2018 12/06/2017 10/30/2017 08/21/2017 08/06/2017 61/60/7371 0/02/2693  Systolic BP 854 627 035 009 381 829 937  Diastolic BP 80 58 76 80 78 75 80  Wt. (Lbs) 209 209.04 - 206 208 208.6 -  BMI 35.87 35.88 - 35.36 35.7 35.61 -

## 2018-02-08 NOTE — Assessment & Plan Note (Signed)
Patient educated about the importance of limiting  Carbohydrate intake , the need to commit to daily physical activity for a minimum of 30 minutes , and to commit weight loss. The fact that changes in all these areas will reduce or eliminate all together the development of diabetes is stressed.  deteriorated  Diabetic Labs Latest Ref Rng & Units 01/01/2018 08/03/2017 03/28/2017 09/25/2016 05/23/2016  HbA1c <5.7 % of total Hgb 6.1(H) - 5.9(H) 5.7(H) 5.8(H)  Chol <200 mg/dL 171 150 150 140 143  HDL >50 mg/dL 74 83 72 70 77  Calc LDL mg/dL (calc) 80 51 59 57 49  Triglycerides <150 mg/dL 90 76 94 64 87  Creatinine 0.50 - 0.99 mg/dL 0.81 0.70 0.79 0.73 0.81   BP/Weight 02/07/2018 12/06/2017 10/30/2017 08/21/2017 08/06/2017 50/35/4656 04/04/2750  Systolic BP 700 174 944 967 591 638 466  Diastolic BP 80 58 76 80 78 75 80  Wt. (Lbs) 209 209.04 - 206 208 208.6 -  BMI 35.87 35.88 - 35.36 35.7 35.61 -   Foot/eye exam completion dates Latest Ref Rng & Units 08/06/2017 07/05/2016  Eye Exam No Retinopathy No Retinopathy No Retinopathy  Foot Form Completion - - -

## 2018-02-08 NOTE — Assessment & Plan Note (Signed)
Controlled, no change in medication  

## 2018-02-08 NOTE — Assessment & Plan Note (Signed)
Deteriorated. Patient re-educated about  the importance of commitment to a  minimum of 150 minutes of exercise per week.  The importance of healthy food choices with portion control discussed. Encouraged to start a food diary, count calories and to consider  joining a support group. Sample diet sheets offered. Goals set by the patient for the next several months.   Weight /BMI 02/07/2018 12/06/2017 08/21/2017  WEIGHT 209 lb 209 lb 0.6 oz 206 lb  HEIGHT 5\' 4"  5\' 4"  5\' 4"   BMI 35.87 kg/m2 35.88 kg/m2 35.36 kg/m2

## 2018-02-08 NOTE — Assessment & Plan Note (Signed)
Tylenol 500 mg , 1 to 2 tabs for maintenance and 400 mg naproxen for flare

## 2018-02-08 NOTE — Assessment & Plan Note (Signed)
Sleep hygiene reviewed and written information offered also. Prescription sent for  medication needed.  

## 2018-02-08 NOTE — Progress Notes (Signed)
Alexis Hurst     MRN: [id number]      DOB: September 28, 1950   HPI Ms. Menor is here for follow up and re-evaluation of chronic medical conditions, medication management and review of any available recent lab and radiology data.  Preventive health is updated, specifically  Cancer screening and Immunization.   Questions or concerns regarding consultations or procedures which the PT has had in the interim are  addressed. The PT denies any adverse reactions to current medications since the last visit.  There are no new concerns.  There are no specific complaints   ROS Denies recent fever or chills. Denies sinus pressure, nasal congestion, ear pain or sore throat. Denies chest congestion, productive cough or wheezing. Denies chest pains, palpitations and leg swelling Denies abdominal pain, nausea, vomiting,diarrhea or constipation.   Denies dysuria, frequency, hesitancy or incontinence. Denies joint pain, swelling and limitation in mobility. Denies headaches, seizures, numbness, or tingling. Denies depression, anxiety or insomnia. Denies skin break down or rash.   PE  BP 130/80   Pulse 100   Resp 16   Ht 5\' 4"  (1.626 m)   Wt 209 lb (94.8 kg)   SpO2 97%   BMI 35.87 kg/m   Patient alert and oriented and in no cardiopulmonary distress.  HEENT: No facial asymmetry, EOMI,   oropharynx pink and moist.  Neck supple no JVD, no mass.  Chest: Clear to auscultation bilaterally.  CVS: S1, S2 no murmurs, no S3.Regular rate.  ABD: Soft non tender.   Ext: No edema  MS: Adequate ROM spine, shoulders, hips and knees.  Skin: Intact, no ulcerations or rash noted.  Psych: Good eye contact, normal affect. Memory intact not anxious or depressed appearing.  CNS: CN 2-12 intact, power,  normal throughout.no focal deficits noted.   Assessment & Plan  Essential hypertension Controlled, no change in medication DASH diet and commitment to daily physical activity for a minimum of 30  minutes discussed and encouraged, as a part of hypertension management. The importance of attaining a healthy weight is also discussed.  BP/Weight 02/07/2018 12/06/2017 10/30/2017 08/21/2017 08/06/2017 93/26/7124 01/09/997  Systolic BP 338 250 539 767 341 937 902  Diastolic BP 80 58 76 80 78 75 80  Wt. (Lbs) 209 209.04 - 206 208 208.6 -  BMI 35.87 35.88 - 35.36 35.7 35.61 -       Morbid obesity (HCC) Deteriorated. Patient re-educated about  the importance of commitment to a  minimum of 150 minutes of exercise per week.  The importance of healthy food choices with portion control discussed. Encouraged to start a food diary, count calories and to consider  joining a support group. Sample diet sheets offered. Goals set by the patient for the next several months.   Weight /BMI 02/07/2018 12/06/2017 08/21/2017  WEIGHT 209 lb 209 lb 0.6 oz 206 lb  HEIGHT 5\' 4"  5\' 4"  5\' 4"   BMI 35.87 kg/m2 35.88 kg/m2 35.36 kg/m2      Depression with anxiety Controlled, no change in medication   ARTHRITIS Tylenol 500 mg , 1 to 2 tabs for maintenance and 400 mg naproxen for flare  Hyposomnia, insomnia or sleeplessness associated with anxiety Sleep hygiene reviewed and written information offered also. Prescription sent for  medication needed.   Prediabetes Patient educated about the importance of limiting  Carbohydrate intake , the need to commit to daily physical activity for a minimum of 30 minutes , and to commit weight loss. The fact that changes in all  these areas will reduce or eliminate all together the development of diabetes is stressed.  deteriorated  Diabetic Labs Latest Ref Rng & Units 01/01/2018 08/03/2017 03/28/2017 09/25/2016 05/23/2016  HbA1c <5.7 % of total Hgb 6.1(H) - 5.9(H) 5.7(H) 5.8(H)  Chol <200 mg/dL 171 150 150 140 143  HDL >50 mg/dL 74 83 72 70 77  Calc LDL mg/dL (calc) 80 51 59 57 49  Triglycerides <150 mg/dL 90 76 94 64 87  Creatinine 0.50 - 0.99 mg/dL 0.81 0.70 0.79 0.73 0.81    BP/Weight 02/07/2018 12/06/2017 10/30/2017 08/21/2017 08/06/2017 70/62/3762 04/06/1516  Systolic BP 616 073 710 626 948 546 270  Diastolic BP 80 58 76 80 78 75 80  Wt. (Lbs) 209 209.04 - 206 208 208.6 -  BMI 35.87 35.88 - 35.36 35.7 35.61 -   Foot/eye exam completion dates Latest Ref Rng & Units 08/06/2017 07/05/2016  Eye Exam No Retinopathy No Retinopathy No Retinopathy  Foot Form Completion - - -      Hyperlipidemia LDL goal <100 Hyperlipidemia:Low fat diet discussed and encouraged.   Lipid Panel  Lab Results  Component Value Date   CHOL 171 01/01/2018   HDL 74 01/01/2018   LDLCALC 80 01/01/2018   TRIG 90 01/01/2018   CHOLHDL 2.3 01/01/2018   Controlled, no change in medication

## 2018-03-13 ENCOUNTER — Other Ambulatory Visit: Payer: Self-pay | Admitting: Family Medicine

## 2018-03-30 ENCOUNTER — Other Ambulatory Visit: Payer: Self-pay | Admitting: Family Medicine

## 2018-06-12 ENCOUNTER — Other Ambulatory Visit: Payer: Self-pay | Admitting: Family Medicine

## 2018-06-12 DIAGNOSIS — Z1231 Encounter for screening mammogram for malignant neoplasm of breast: Secondary | ICD-10-CM

## 2018-06-13 ENCOUNTER — Other Ambulatory Visit: Payer: Self-pay | Admitting: Family Medicine

## 2018-06-17 ENCOUNTER — Encounter: Payer: Self-pay | Admitting: Family Medicine

## 2018-06-17 ENCOUNTER — Ambulatory Visit (INDEPENDENT_AMBULATORY_CARE_PROVIDER_SITE_OTHER): Payer: Medicare Other | Admitting: Family Medicine

## 2018-06-17 ENCOUNTER — Other Ambulatory Visit: Payer: Self-pay

## 2018-06-17 VITALS — BP 142/82 | HR 92 | Resp 12 | Ht 64.0 in | Wt 208.1 lb

## 2018-06-17 DIAGNOSIS — F5105 Insomnia due to other mental disorder: Secondary | ICD-10-CM

## 2018-06-17 DIAGNOSIS — I10 Essential (primary) hypertension: Secondary | ICD-10-CM | POA: Diagnosis not present

## 2018-06-17 DIAGNOSIS — F419 Anxiety disorder, unspecified: Secondary | ICD-10-CM

## 2018-06-17 DIAGNOSIS — E785 Hyperlipidemia, unspecified: Secondary | ICD-10-CM

## 2018-06-17 DIAGNOSIS — N951 Menopausal and female climacteric states: Secondary | ICD-10-CM | POA: Diagnosis not present

## 2018-06-17 DIAGNOSIS — Z0000 Encounter for general adult medical examination without abnormal findings: Secondary | ICD-10-CM | POA: Diagnosis not present

## 2018-06-17 DIAGNOSIS — R7303 Prediabetes: Secondary | ICD-10-CM

## 2018-06-17 DIAGNOSIS — Z23 Encounter for immunization: Secondary | ICD-10-CM | POA: Diagnosis not present

## 2018-06-17 DIAGNOSIS — R232 Flushing: Secondary | ICD-10-CM

## 2018-06-17 DIAGNOSIS — R350 Frequency of micturition: Secondary | ICD-10-CM

## 2018-06-17 DIAGNOSIS — E669 Obesity, unspecified: Secondary | ICD-10-CM

## 2018-06-17 LAB — POCT URINALYSIS DIPSTICK
Bilirubin, UA: NEGATIVE
GLUCOSE UA: NEGATIVE
Ketones, UA: NEGATIVE
LEUKOCYTES UA: NEGATIVE
Nitrite, UA: NEGATIVE
PH UA: 7.5 (ref 5.0–8.0)
Protein, UA: POSITIVE — AB
Spec Grav, UA: 1.02 (ref 1.010–1.025)
UROBILINOGEN UA: 1 U/dL

## 2018-06-17 MED ORDER — ATORVASTATIN CALCIUM 10 MG PO TABS
10.0000 mg | ORAL_TABLET | Freq: Every day | ORAL | 2 refills | Status: DC
Start: 2018-06-17 — End: 2018-11-13

## 2018-06-17 MED ORDER — TRAZODONE HCL 50 MG PO TABS: ORAL_TABLET | ORAL | 3 refills | Status: DC

## 2018-06-17 MED ORDER — CLONIDINE HCL 0.1 MG PO TABS: ORAL_TABLET | ORAL | 5 refills | Status: DC

## 2018-06-17 NOTE — Progress Notes (Signed)
Preventive Screening-Counseling & Management   Patient present here today for a welcome to  Medicare wellness visit. C/o poor sleep and is concerned that he insurance will no longer cover Ambien which she has relied on for years Increased stress currently as her brother is ill and has moved in with their Mother while he should be at a family home care C/o increased hot flashe   Current Problems (verified)   Medications Prior to Visit Allergies (verified)   PAST HISTORY  Family History Mother is still alive and 64 years old. Father has passed.  Social History Is active in her church.Married x  More than 77m years, Mom of 2  adult children   Risk Factors  Current exercise habits:  Encouraged to exercise 150 minutes weekly  Dietary issues discussed: Encouraged to eat a heart healthy low fat low carb diet   Cardiac risk factors:   Depression Screen  (Note: if answer to either of the following is "Yes", a more complete depression screening is indicated)   Over the past two weeks, have you felt down, depressed or hopeless? No  Over the past two weeks, have you felt little interest or pleasure in doing things? No  Have you lost interest or pleasure in daily life? No  Do you often feel hopeless? No  Do you cry easily over simple problems? No   Activities of Daily Living  In your present state of health, do you have any difficulty performing the following activities?  Driving?: No Managing money?: No Feeding yourself?:No Getting from bed to chair?:No Climbing a flight of stairs?:No Preparing food and eating?:No Bathing or showering?:No Getting dressed?:No Getting to the toilet?:No Using the toilet?:No Moving around from place to place?: No  Fall Risk Assessment In the past year have you fallen or had a near fall?:No Are you currently taking any medications that make you dizzi?:No   Hearing Difficulties: No Do you often ask people to speak up or repeat themselves?:No Do  you experience ringing or noises in your ears?:No Do you have difficulty understanding soft or whispered voices?:No  Cognitive Testing  Alert? Yes Normal Appearance?Yes  Oriented to person? Yes Place? Yes  Time? Yes  Displays appropriate judgment?Yes  Can read the correct time from a watch face? yes Are you having problems remembering things?No  Advanced Directives have been discussed with the patient?Yes    List the Names of Other Physician/Practitioners you currently use:    Indicate any recent Medical Services you may have received from other than [facility] providers in the past year (date may be approximate).     Medicare Attestation  I have personally reviewed:  The patient's medical and social history  Their use of alcohol, tobacco or illicit drugs  Their current medications and supplements  The patient's functional ability including ADLs,fall risks, home safety risks, cognitive, and hearing and visual impairment  Diet and physical activities  Evidence for depression or mood disorders  The patient's weight, height, BMI, and visual acuity have been recorded in the chart. I have made referrals, counseling, and provided education to the patient based on review of the above and I have provided the patient with a written personalized care plan for preventive services.    Physical Exam BP (!) 142/82 (BP Location: Right Arm, Patient Position: Sitting, Cuff Size: Large)   Pulse 92   Resp 12   Ht 5\' 4"  (1.626 m)   Wt 208 lb 1.9 oz (94.4 kg)   SpO2 96% Comment: room  air  BMI 35.72 kg/m    Chest : CTA bilaterally  CVS; heart sound 1 and 2 heard, n murmur ,no S3  EKG: sinus rhythm , rate 88, no ischemia or LVH UA: abn sent fo c/s , report as no growth, wil repeat   Assessment & Plan:  Welcome to Medicare preventive visit Visit as documented. History and medications are reviewed EKG and UA obtained due to long standing HTN. EKG shows sinus rhythm with a rate of 88, no  ischemia or LVH noted Immunization needs addressed, both influenza and Pneumonia vaccines ar adminkstered  Essential hypertension Uncontrolled and not at goal.  Add clonidine , which be benficial both with sleep and hot flashes also DASH diet and commitment to daily physical activity for a minimum of 30 minutes discussed and encouraged, as a part of hypertension management. The importance of attaining a healthy weight is also discussed. EKG obtained,: sinus rhythm rate 88, no LVH , no ischemia UA mildly abn: trace protein and RBC, culture negative for infectiion  BP/Weight 06/17/2018 02/07/2018 12/06/2017 10/30/2017 08/21/2017 08/06/2017 80/16/5537  Systolic BP 482 707 867 544 920 100 712  Diastolic BP 82 80 58 76 80 78 75  Wt. (Lbs) 208.12 209 209.04 - 206 208 208.6  BMI 35.72 35.87 35.88 - 35.36 35.7 35.61  f/u in 6 weeks     Hyposomnia, insomnia or sleeplessness associated with anxiety New anxiety as Azolide not covered Sleep hygiene reviewed Start trazodone and clonidine F/u in 6 to 8 weeks  Hot flashes due to menopause Worsened over past several months Already on effexor. Add clonidine and re educated re general measures to take to reduce symptoms F/u in 8 weeks  Need for prophylactic vaccination against Streptococcus pneumoniae (pneumococcus) and influenza After obtaining informed consent, the vaccines are  administered by LPN.

## 2018-06-17 NOTE — Patient Instructions (Addendum)
F/U with mD in 5 to 6 weeks, call if you need me sooner  Flu and pneumonia vaccines today   New for blood pressure, sleep and hot flashes,is clonidine at bedtime and trazodone    Fasting lipid, cmp and EGFr, TSH, CBC, hBA1C

## 2018-06-18 ENCOUNTER — Encounter: Payer: Self-pay | Admitting: Family Medicine

## 2018-06-18 LAB — CBC
HEMATOCRIT: 37.1 % (ref 35.0–45.0)
HEMOGLOBIN: 12.4 g/dL (ref 11.7–15.5)
MCH: 29.8 pg (ref 27.0–33.0)
MCHC: 33.4 g/dL (ref 32.0–36.0)
MCV: 89.2 fL (ref 80.0–100.0)
MPV: 10.7 fL (ref 7.5–12.5)
Platelets: 295 10*3/uL (ref 140–400)
RBC: 4.16 10*6/uL (ref 3.80–5.10)
RDW: 13.4 % (ref 11.0–15.0)
WBC: 4 10*3/uL (ref 3.8–10.8)

## 2018-06-18 LAB — COMPLETE METABOLIC PANEL WITH GFR
AG Ratio: 1.3 (calc) (ref 1.0–2.5)
ALKALINE PHOSPHATASE (APISO): 99 U/L (ref 33–130)
ALT: 34 U/L — AB (ref 6–29)
AST: 23 U/L (ref 10–35)
Albumin: 4 g/dL (ref 3.6–5.1)
BUN: 11 mg/dL (ref 7–25)
CALCIUM: 8.8 mg/dL (ref 8.6–10.4)
CO2: 28 mmol/L (ref 20–32)
CREATININE: 0.73 mg/dL (ref 0.50–0.99)
Chloride: 105 mmol/L (ref 98–110)
GFR, EST AFRICAN AMERICAN: 100 mL/min/{1.73_m2} (ref >=60)
GFR, EST NON AFRICAN AMERICAN: 86 mL/min/{1.73_m2} (ref >=60)
GLOBULIN: 3 g/dL (ref 1.9–3.7)
GLUCOSE: 92 mg/dL (ref 65–139)
Potassium: 4.1 mmol/L (ref 3.5–5.3)
SODIUM: 140 mmol/L (ref 135–146)
Total Bilirubin: 0.5 mg/dL (ref 0.2–1.2)
Total Protein: 7 g/dL (ref 6.1–8.1)

## 2018-06-18 LAB — URINE CULTURE
MICRO NUMBER: 91234718
RESULT: NO GROWTH
SPECIMEN QUALITY: ADEQUATE

## 2018-06-18 LAB — HEMOGLOBIN A1C
Hgb A1c MFr Bld: 6.1 % of total Hgb — ABNORMAL HIGH (ref <5.7)
Mean Plasma Glucose: 128 (calc)
eAG (mmol/L): 7.1 (calc)

## 2018-06-18 LAB — LIPID PANEL
CHOL/HDL RATIO: 2.3 (calc) (ref <5.0)
Cholesterol: 167 mg/dL (ref <200)
HDL: 72 mg/dL (ref >50)
LDL CHOLESTEROL (CALC): 80 mg/dL
Non-HDL Cholesterol (Calc): 95 mg/dL (calc) (ref <130)
Triglycerides: 71 mg/dL (ref <150)

## 2018-06-18 LAB — VITAMIN D 25 HYDROXY (VIT D DEFICIENCY, FRACTURES): VIT D 25 HYDROXY: 24 ng/mL — AB (ref 30–100)

## 2018-06-18 LAB — TSH: TSH: 0.56 m[IU]/L (ref 0.40–4.50)

## 2018-06-19 ENCOUNTER — Encounter: Payer: Self-pay | Admitting: Family Medicine

## 2018-06-23 ENCOUNTER — Encounter: Payer: Self-pay | Admitting: Family Medicine

## 2018-06-23 DIAGNOSIS — N951 Menopausal and female climacteric states: Secondary | ICD-10-CM | POA: Insufficient documentation

## 2018-06-23 DIAGNOSIS — Z0000 Encounter for general adult medical examination without abnormal findings: Secondary | ICD-10-CM | POA: Insufficient documentation

## 2018-06-23 DIAGNOSIS — Z0001 Encounter for general adult medical examination with abnormal findings: Secondary | ICD-10-CM | POA: Insufficient documentation

## 2018-06-23 NOTE — Assessment & Plan Note (Signed)
Worsened over past several months Already on effexor. Add clonidine and re educated re general measures to take to reduce symptoms F/u in 8 weeks

## 2018-06-23 NOTE — Assessment & Plan Note (Signed)
New anxiety as Azolide not covered Sleep hygiene reviewed Start trazodone and clonidine F/u in 6 to 8 weeks

## 2018-06-23 NOTE — Assessment & Plan Note (Signed)
Visit as documented. History and medications are reviewed EKG and UA obtained due to long standing HTN. EKG shows sinus rhythm with a rate of 88, no ischemia or LVH noted Immunization needs addressed, both influenza and Pneumonia vaccines ar adminkstered

## 2018-06-23 NOTE — Assessment & Plan Note (Addendum)
Uncontrolled and not at goal.  Add clonidine , which be benficial both with sleep and hot flashes also DASH diet and commitment to daily physical activity for a minimum of 30 minutes discussed and encouraged, as a part of hypertension management. The importance of attaining a healthy weight is also discussed. EKG obtained,: sinus rhythm rate 88, no LVH , no ischemia UA mildly abn: trace protein and RBC, culture negative for infectiion  BP/Weight 06/17/2018 02/07/2018 12/06/2017 10/30/2017 08/21/2017 08/06/2017 81/85/9093  Systolic BP 112 162 446 950 722 575 051  Diastolic BP 82 80 58 76 80 78 75  Wt. (Lbs) 208.12 209 209.04 - 206 208 208.6  BMI 35.72 35.87 35.88 - 35.36 35.7 35.61  f/u in 6 weeks

## 2018-06-23 NOTE — Assessment & Plan Note (Signed)
After obtaining informed consent, the vaccines are  administered by LPN.  

## 2018-06-26 ENCOUNTER — Ambulatory Visit (HOSPITAL_COMMUNITY)
Admission: RE | Admit: 2018-06-26 | Discharge: 2018-06-26 | Disposition: A | Discharge status: Home / Self Care | Payer: Medicare Other | Source: Ambulatory Visit | Attending: Family Medicine | Admitting: Family Medicine

## 2018-06-26 DIAGNOSIS — Z1231 Encounter for screening mammogram for malignant neoplasm of breast: Secondary | ICD-10-CM

## 2018-07-04 ENCOUNTER — Other Ambulatory Visit: Payer: Self-pay | Admitting: Family Medicine

## 2018-07-09 ENCOUNTER — Telehealth: Payer: Self-pay | Admitting: Family Medicine

## 2018-07-09 NOTE — Telephone Encounter (Signed)
Please contact pt and specifically inquire and document her response re trazodone prescribed and it's effectiveness  On sleep. She has a request in for zolpidem but this is not covered and she has to "fail" other agents before her ins will consider it, please send me back a message

## 2018-07-10 ENCOUNTER — Other Ambulatory Visit: Payer: Self-pay | Admitting: Family Medicine

## 2018-07-10 MED ORDER — ZOLPIDEM TARTRATE ER 12.5 MG PO TBCR: 12.5000 mg | EXTENDED_RELEASE_TABLET | Freq: Every evening | ORAL | 5 refills | Status: DC | PRN

## 2018-07-10 NOTE — Progress Notes (Signed)
Zolpidem cr

## 2018-07-10 NOTE — Telephone Encounter (Signed)
Spoke with patient and she states that Trazodone is really not helping with her sleep, and that she feels "drugged" in the mornings. She is continuing to try it. Wants her Ambien back.

## 2018-07-10 NOTE — Telephone Encounter (Signed)
Spoke with patient she is aware

## 2018-07-10 NOTE — Telephone Encounter (Signed)
Sent to [facility] pls let her know

## 2018-07-24 ENCOUNTER — Ambulatory Visit: Payer: Self-pay | Admitting: Family Medicine

## 2018-08-12 LAB — HM DIABETES EYE EXAM

## 2018-08-19 ENCOUNTER — Other Ambulatory Visit: Payer: Self-pay | Admitting: Family Medicine

## 2018-08-21 ENCOUNTER — Encounter: Payer: Self-pay | Admitting: Family Medicine

## 2018-08-21 ENCOUNTER — Encounter

## 2018-08-21 ENCOUNTER — Ambulatory Visit (INDEPENDENT_AMBULATORY_CARE_PROVIDER_SITE_OTHER): Payer: Medicare Other | Admitting: Family Medicine

## 2018-08-21 VITALS — BP 126/62 | HR 98 | Resp 14 | Ht 64.0 in | Wt 209.1 lb

## 2018-08-21 DIAGNOSIS — Z13220 Encounter for screening for lipoid disorders: Secondary | ICD-10-CM

## 2018-08-21 DIAGNOSIS — F5101 Primary insomnia: Secondary | ICD-10-CM

## 2018-08-21 DIAGNOSIS — Z780 Asymptomatic menopausal state: Secondary | ICD-10-CM

## 2018-08-21 DIAGNOSIS — F418 Other specified anxiety disorders: Secondary | ICD-10-CM

## 2018-08-21 DIAGNOSIS — E785 Hyperlipidemia, unspecified: Secondary | ICD-10-CM

## 2018-08-21 DIAGNOSIS — Z1211 Encounter for screening for malignant neoplasm of colon: Secondary | ICD-10-CM

## 2018-08-21 DIAGNOSIS — R7303 Prediabetes: Secondary | ICD-10-CM

## 2018-08-21 DIAGNOSIS — I10 Essential (primary) hypertension: Secondary | ICD-10-CM | POA: Diagnosis not present

## 2018-08-21 LAB — GLUCOSE, POCT (MANUAL RESULT ENTRY): POC Glucose: 86 mg/dl (ref 70–99)

## 2018-08-21 MED ORDER — FLUOXETINE HCL 40 MG PO CAPS: 40.0000 mg | ORAL_CAPSULE | Freq: Every day | ORAL | 5 refills | Status: DC

## 2018-08-21 NOTE — Patient Instructions (Signed)
F/u in 2.4 5 months, calll if you need me before  We will call ins re sleep med and get back to you asap  Reduce lipitor to 3 days/ week and stay on low fat diet  Increase fluoxetine to 40 mg daily, OK to take TWO 29 mg tablets daily till done  Fingerstick for blood sugar in office today  Fasting lipid, cmp and EGFR 1 week before next visit  You are referred for bone density test , pls get appt at front desk at checkout   Cologuard test for colon cancer screen  Thank you  for choosing [facility]. We consider it a privelige to serve you.  Delivering excellent health care in a caring and  compassionate way is our goal.  Partnering with you,  so that together we can achieve this goal is our strategy.

## 2018-08-22 ENCOUNTER — Telehealth: Payer: Self-pay | Admitting: Family Medicine

## 2018-08-22 ENCOUNTER — Encounter: Payer: Self-pay | Admitting: Family Medicine

## 2018-08-22 MED ORDER — SUVOREXANT 10 MG PO TABS: 1.0000 | ORAL_TABLET | Freq: Every day | ORAL | 2 refills | Status: DC

## 2018-08-22 NOTE — Progress Notes (Signed)
Alexis Hurst     MRN: [id number]      DOB: 02-26-1951   HPI Alexis Hurst is here for follow up and re-evaluation of chronic medical conditions, medication management and review of any available recent lab and radiology data.  Preventive health is updated, specifically  Cancer screening and Immunization.   C/o increased fatigue, wonders if sh is now diabetic, requests fingerstick at visit, has had nothing to eat this morning Denies polyuria, polydipsia, blurred vision , or hypoglycemic episodes.   ROS Denies recent fever or chills.c/o excess fatigue and poor t no sleep, takes care  Of everyone but herself Denies sinus pressure, nasal congestion, ear pain or sore throat. Denies chest congestion, productive cough or wheezing. Denies chest pains, palpitations and leg swelling Denies abdominal pain, nausea, vomiting,diarrhea or constipation.   Denies dysuria, frequency, hesitancy or incontinence. Denies uncontrolled  joint pain, swelling and limitation in mobility. Denies headaches, seizures, numbness, or tingling. C/o  depression, anxiety and  Insomnia.Depression improved but not recovered, and sleep is impossible , no medication approved Denies skin break down or rash.   PE  BP 126/62 (BP Location: Right Arm, Patient Position: Sitting, Cuff Size: Large)   Pulse 98   Resp 14   Ht 5\' 4"  (1.626 m)   Wt 209 lb 1.3 oz (94.8 kg)   SpO2 96% Comment: room air  BMI 35.89 kg/m   Patient alert and oriented and in no cardiopulmonary distress.  HEENT: No facial asymmetry, EOMI,   oropharynx pink and moist.  Neck supple no JVD, no mass.  Chest: Clear to auscultation bilaterally.  CVS: S1, S2 no murmurs, no S3.Regular rate.  ABD: Soft non tender.   Ext: No edema  MS: Adequate ROM spine, shoulders, hips and knees.  Skin: Intact, no ulcerations or rash noted.  Psych: Good eye contact, normal affect. Memory intact not anxious or depressed appearing.  CNS: CN 2-12 intact, power,   normal throughout.no focal deficits noted.   Assessment & Plan  Insomnia Chronic insomnia x over 10 years, relies on medication for sleep. Has responded best to [facility] ER in the past , unable to get this prescribed because of insurance coverage. Tried trazodone , and had significant hangover effect. Response from her ins is that she needs to try BOTH Besomra and Rozerem. Sleep hygiene is reviewed with her and she does practice good sleep hygiene  Depression with anxiety Improved , however still function is not as as desired , will increase the dose of fluoxetine and review in next approx 10 weeks, she is not suicidal or homicidal  Hyperlipidemia LDL goal <100 Hyperlipidemia:Low fat diet discussed and encouraged.   Lipid Panel  Lab Results  Component Value Date   CHOL 167 06/17/2018   HDL 72 06/17/2018   LDLCALC 80 06/17/2018   TRIG 71 06/17/2018   CHOLHDL 2.3 06/17/2018    At goal, reduce frequency of lipitor because of elevated liver enzyme , which is mild   Essential hypertension Controlled, no change in medication DASH diet and commitment to daily physical activity for a minimum of 30 minutes discussed and encouraged, as a part of hypertension management. The importance of attaining a healthy weight is also discussed.  BP/Weight 08/21/2018 06/17/2018 02/07/2018 12/06/2017 10/30/2017 08/21/2017 66/0/6301  Systolic BP 601 093 235 573 220 254 270  Diastolic BP 62 82 80 58 76 80 78  Wt. (Lbs) 209.08 208.12 209 209.04 - 206 208  BMI 35.89 35.72 35.87 35.88 - 35.36 35.7  Morbid obesity ([place]) Deteriorated. Patient re-educated about  the importance of commitment to a  minimum of 150 minutes of exercise per week.  The importance of healthy food choices with portion control discussed. Encouraged to start a food diary, count calories and to consider  joining a support group. Sample diet sheets offered. Goals set by the patient for the next several months.   Weight /BMI  08/21/2018 06/17/2018 02/07/2018  WEIGHT 209 lb 1.3 oz 208 lb 1.9 oz 209 lb  HEIGHT 5\' 4"  5\' 4"  5\' 4"   BMI 35.89 kg/m2 35.72 kg/m2 35.87 kg/m2

## 2018-08-22 NOTE — Assessment & Plan Note (Signed)
Chronic insomnia x over 10 years, relies on medication for sleep. Has responded best to [facility] ER in the past , unable to get this prescribed because of insurance coverage. Tried trazodone , and had significant hangover effect. Response from her ins is that she needs to try BOTH Besomra and Rozerem. Sleep hygiene is reviewed with her and she does practice good sleep hygiene

## 2018-08-22 NOTE — Assessment & Plan Note (Signed)
Deteriorated. Patient re-educated about  the importance of commitment to a  minimum of 150 minutes of exercise per week.  The importance of healthy food choices with portion control discussed. Encouraged to start a food diary, count calories and to consider  joining a support group. Sample diet sheets offered. Goals set by the patient for the next several months.   Weight /BMI 08/21/2018 06/17/2018 02/07/2018  WEIGHT 209 lb 1.3 oz 208 lb 1.9 oz 209 lb  HEIGHT 5\' 4"  5\' 4"  5\' 4"   BMI 35.89 kg/m2 35.72 kg/m2 35.87 kg/m2

## 2018-08-22 NOTE — Assessment & Plan Note (Signed)
Controlled, no change in medication DASH diet and commitment to daily physical activity for a minimum of 30 minutes discussed and encouraged, as a part of hypertension management. The importance of attaining a healthy weight is also discussed.  BP/Weight 08/21/2018 06/17/2018 02/07/2018 12/06/2017 10/30/2017 08/21/2017 89/0/2284  Systolic BP 069 861 483 073 543 014 840  Diastolic BP 62 82 80 58 76 80 78  Wt. (Lbs) 209.08 208.12 209 209.04 - 206 208  BMI 35.89 35.72 35.87 35.88 - 35.36 35.7

## 2018-08-22 NOTE — Assessment & Plan Note (Signed)
Improved , however still function is not as as desired , will increase the dose of fluoxetine and review in next approx 10 weeks, she is not suicidal or homicidal

## 2018-08-22 NOTE — Telephone Encounter (Signed)
Spoke with patient and let her know that insurance wants her to try other medications first. Belsomra script has been sent in for her with verbal understanding. Told patient to call us and let us know if it doesn't work.

## 2018-08-22 NOTE — Telephone Encounter (Signed)
Pls call and let pt know we have heard from her ins. She needs to ry these 2 other medications and if they do not work for her insomnia, then she will be able to go back on [place] ER as before  I have sent in [place], she can start today, the other med is Rozerem, hopefully , the Belsomra will work!

## 2018-08-22 NOTE — Assessment & Plan Note (Addendum)
Hyperlipidemia:Low fat diet discussed and encouraged.   Lipid Panel  Lab Results  Component Value Date   CHOL 167 06/17/2018   HDL 72 06/17/2018   LDLCALC 80 06/17/2018   TRIG 71 06/17/2018   CHOLHDL 2.3 06/17/2018    At goal, reduce frequency of lipitor because of elevated liver enzyme , which is mild

## 2018-08-30 ENCOUNTER — Other Ambulatory Visit: Payer: Self-pay | Admitting: Family Medicine

## 2018-09-02 ENCOUNTER — Other Ambulatory Visit (HOSPITAL_COMMUNITY): Payer: Self-pay

## 2018-09-08 ENCOUNTER — Encounter: Payer: Self-pay | Admitting: Family Medicine

## 2018-09-28 ENCOUNTER — Telehealth: Payer: Self-pay | Admitting: Family Medicine

## 2018-09-28 NOTE — Telephone Encounter (Signed)
Needs to try both belsomra and rozerem prior to approval of zolpidem tartrate ER

## 2018-10-17 ENCOUNTER — Other Ambulatory Visit: Payer: Self-pay | Admitting: Family Medicine

## 2018-10-17 DIAGNOSIS — F419 Anxiety disorder, unspecified: Secondary | ICD-10-CM

## 2018-10-17 DIAGNOSIS — F5105 Insomnia due to other mental disorder: Principal | ICD-10-CM

## 2018-10-17 LAB — COLOGUARD: Cologuard: NEGATIVE

## 2018-11-12 LAB — LIPID PANEL
Cholesterol: 172 mg/dL (ref <200)
HDL: 74 mg/dL (ref >=50)
LDL Cholesterol (Calc): 81 mg/dL (calc)
Non-HDL Cholesterol (Calc): 98 mg/dL (calc) (ref <130)
Total CHOL/HDL Ratio: 2.3 (calc) (ref <5.0)
Triglycerides: 86 mg/dL (ref <150)

## 2018-11-12 LAB — COMPLETE METABOLIC PANEL WITH GFR
AG Ratio: 1.3 (calc) (ref 1.0–2.5)
ALT: 23 U/L (ref 6–29)
AST: 20 U/L (ref 10–35)
Albumin: 4.1 g/dL (ref 3.6–5.1)
Alkaline phosphatase (APISO): 105 U/L (ref 37–153)
BUN: 10 mg/dL (ref 7–25)
CO2: 30 mmol/L (ref 20–32)
Calcium: 9.3 mg/dL (ref 8.6–10.4)
Chloride: 102 mmol/L (ref 98–110)
Creat: 0.85 mg/dL (ref 0.50–0.99)
GFR, EST AFRICAN AMERICAN: 83 mL/min/{1.73_m2} (ref >=60)
GFR, Est Non African American: 72 mL/min/{1.73_m2} (ref >=60)
Globulin: 3.2 g/dL (calc) (ref 1.9–3.7)
Glucose, Bld: 89 mg/dL (ref 65–99)
Potassium: 4 mmol/L (ref 3.5–5.3)
Sodium: 141 mmol/L (ref 135–146)
Total Bilirubin: 0.4 mg/dL (ref 0.2–1.2)
Total Protein: 7.3 g/dL (ref 6.1–8.1)

## 2018-11-13 ENCOUNTER — Ambulatory Visit (INDEPENDENT_AMBULATORY_CARE_PROVIDER_SITE_OTHER): Payer: Medicare Other | Admitting: Family Medicine

## 2018-11-13 ENCOUNTER — Other Ambulatory Visit: Payer: Self-pay

## 2018-11-13 ENCOUNTER — Ambulatory Visit (HOSPITAL_COMMUNITY)
Admission: RE | Admit: 2018-11-13 | Discharge: 2018-11-13 | Disposition: A | Discharge status: Home / Self Care | Payer: Medicare Other | Source: Ambulatory Visit | Attending: Family Medicine | Admitting: Family Medicine

## 2018-11-13 ENCOUNTER — Encounter: Payer: Self-pay | Admitting: Family Medicine

## 2018-11-13 VITALS — BP 132/80 | HR 95 | Resp 15 | Ht 64.0 in | Wt 210.0 lb

## 2018-11-13 DIAGNOSIS — R002 Palpitations: Secondary | ICD-10-CM

## 2018-11-13 DIAGNOSIS — I10 Essential (primary) hypertension: Secondary | ICD-10-CM | POA: Diagnosis not present

## 2018-11-13 DIAGNOSIS — F418 Other specified anxiety disorders: Secondary | ICD-10-CM

## 2018-11-13 DIAGNOSIS — M7989 Other specified soft tissue disorders: Secondary | ICD-10-CM

## 2018-11-13 DIAGNOSIS — F419 Anxiety disorder, unspecified: Secondary | ICD-10-CM

## 2018-11-13 DIAGNOSIS — R05 Cough: Secondary | ICD-10-CM | POA: Diagnosis present

## 2018-11-13 DIAGNOSIS — R5383 Other fatigue: Secondary | ICD-10-CM

## 2018-11-13 DIAGNOSIS — J309 Allergic rhinitis, unspecified: Secondary | ICD-10-CM

## 2018-11-13 DIAGNOSIS — R7303 Prediabetes: Secondary | ICD-10-CM

## 2018-11-13 DIAGNOSIS — F5101 Primary insomnia: Secondary | ICD-10-CM

## 2018-11-13 DIAGNOSIS — E785 Hyperlipidemia, unspecified: Secondary | ICD-10-CM

## 2018-11-13 DIAGNOSIS — R059 Cough, unspecified: Secondary | ICD-10-CM

## 2018-11-13 DIAGNOSIS — F5105 Insomnia due to other mental disorder: Secondary | ICD-10-CM

## 2018-11-13 MED ORDER — BENZONATATE 100 MG PO CAPS: 100.0000 mg | ORAL_CAPSULE | Freq: Two times a day (BID) | ORAL | 0 refills | Status: DC | PRN

## 2018-11-13 MED ORDER — CHLORPHENIRAMINE MALEATE 4 MG PO TABS: 4.0000 mg | ORAL_TABLET | Freq: Two times a day (BID) | ORAL | 0 refills | Status: DC | PRN

## 2018-11-13 MED ORDER — MONTELUKAST SODIUM 10 MG PO TABS: 10.0000 mg | ORAL_TABLET | Freq: Every day | ORAL | 3 refills | Status: DC

## 2018-11-13 MED ORDER — PREDNISONE 5 MG PO TABS: 5.0000 mg | ORAL_TABLET | Freq: Two times a day (BID) | ORAL | 0 refills | Status: AC

## 2018-11-13 MED ORDER — FLUOXETINE HCL 40 MG PO CAPS: 40.0000 mg | ORAL_CAPSULE | Freq: Every day | ORAL | 5 refills | Status: DC

## 2018-11-13 MED ORDER — ATORVASTATIN CALCIUM 10 MG PO TABS: 10.0000 mg | ORAL_TABLET | Freq: Every day | ORAL | 2 refills | Status: DC

## 2018-11-13 MED ORDER — CLONIDINE HCL 0.1 MG PO TABS: ORAL_TABLET | ORAL | 5 refills | Status: DC

## 2018-11-13 MED ORDER — PROMETHAZINE-DM 6.25-15 MG/5ML PO SYRP: ORAL_SOLUTION | ORAL | 0 refills | Status: DC

## 2018-11-13 NOTE — Patient Instructions (Addendum)
F/U  In 5 month, call if you need me before  HBA1C, chem 7 and EGFr, and CBC 1 week before next visit  We will reschedule dexa and call you  Please getr CXr today Medication is sent for chronic cough  You are being referred to Cardiology as discussed,  for evaluation of your heart and for circulation in your legs  Thank you  for choosing [facility]. We consider it a privelige to serve you.  Delivering excellent health care in a caring and  compassionate way is our goal.  Partnering with you,  so that together we can achieve this goal is our strategy.

## 2018-11-16 ENCOUNTER — Other Ambulatory Visit: Payer: Self-pay | Admitting: Family Medicine

## 2018-11-16 DIAGNOSIS — R05 Cough: Secondary | ICD-10-CM | POA: Insufficient documentation

## 2018-11-16 DIAGNOSIS — R053 Chronic cough: Secondary | ICD-10-CM | POA: Insufficient documentation

## 2018-11-16 DIAGNOSIS — M7989 Other specified soft tissue disorders: Secondary | ICD-10-CM | POA: Insufficient documentation

## 2018-11-16 DIAGNOSIS — R059 Cough, unspecified: Secondary | ICD-10-CM

## 2018-11-16 MED ORDER — TRAZODONE HCL 50 MG PO TABS: 50.0000 mg | ORAL_TABLET | Freq: Every day | ORAL | 5 refills | Status: DC

## 2018-11-16 NOTE — Assessment & Plan Note (Signed)
Patient concerned about possible PAD will request cardiology evaluation for PAD also

## 2018-11-16 NOTE — Assessment & Plan Note (Signed)
Over 2 month h/o cough, associated with post nasal drainage CXR on day of visit Clinical impression is that cough is primarily due to post nasal drainage

## 2018-11-16 NOTE — Assessment & Plan Note (Signed)
Hyperlipidemia:Low fat diet discussed and encouraged.   Lipid Panel  Lab Results  Component Value Date   CHOL 172 11/12/2018   HDL 74 11/12/2018   LDLCALC 81 11/12/2018   TRIG 86 11/12/2018   CHOLHDL 2.3 11/12/2018  Controlled, no change in medication

## 2018-11-16 NOTE — Assessment & Plan Note (Signed)
Controlled on current medication, despite acute grief. Continue current medications Re address in 5 months

## 2018-11-16 NOTE — Assessment & Plan Note (Signed)
Deteriorated.Obesity associated with hypertension and osteoarthritis  Patient re-educated about  the importance of commitment to a  minimum of 150 minutes of exercise per week as able.  The importance of healthy food choices with portion control discussed, as well as eating regularly and within a 12 hour window most days. The need to choose "clean , green" food 50 to 75% of the time is discussed, as well as to make water the primary drink and set a goal of 64 ounces water daily.  Encouraged to start a food diary,  and to consider  joining a support group. Sample diet sheets offered. Goals set by the patient for the next several months.   Weight /BMI 11/13/2018 08/21/2018 06/17/2018  WEIGHT 210 lb 209 lb 1.3 oz 208 lb 1.9 oz  HEIGHT 5\' 4"  5\' 4"  5\' 4"   BMI 36.05 kg/m2 35.89 kg/m2 35.72 kg/m2

## 2018-11-16 NOTE — Assessment & Plan Note (Signed)
Good dd response to trazodone, will continue same  Sleep hygiene reviewed and written information offered also. Prescription sent for  medication needed.

## 2018-11-16 NOTE — Assessment & Plan Note (Signed)
Reports post nasal drainage takes no allergy medication regularly and wants to use only as needed, encouraged daily use to see effect on cough

## 2018-11-16 NOTE — Progress Notes (Signed)
Alexis Hurst     MRN: [id number]      DOB: 1951-04-19   HPI Alexis Hurst is here for follow up and re-evaluation of chronic medical conditions, medication management and review of any available recent lab and radiology data.  Preventive health is updated, specifically  Cancer screening and Immunization.   C/o cough for more than 2 months with post nasal drainage, she denies fevr or chills or any significant sputum production. C/o fatigue and no regular exercise stays busy caring for her elderly Mother and a brother who is also ill. Recently lost a sister , who had both heart disease and PAD, patient is now very concerned that she also may have both conditions and wants to be fully evaluated. Trying to deal with the immediate grief of loss unexpected in some ways , although she was aware that her sister had inoperable CAD  Good response to trazodone which she will continue ROS Denies recent fever or chills. Denies sinus pressure, nasal congestion, ear pain or sore throat. Denies chest congestion, productive cough or wheezing. C/o bilateral leg swelling  Denies abdominal pain, nausea, vomiting,diarrhea or constipation.   Denies dysuria, frequency, hesitancy or incontinence. Chronic  joint pain, swelling and limitation in mobility. . Denies uncontrolled  depression, anxiety or insomnia. Denies skin break down or rash.   PE  BP 132/80   Pulse 95   Resp 15   Ht 5\' 4"  (1.626 m)   Wt 210 lb (95.3 kg)   SpO2 98%   BMI 36.05 kg/m   Patient alert and oriented and in no cardiopulmonary distress.  HEENT: No facial asymmetry, EOMI,   oropharynx pink and moist.  Neck supple no JVD, no mass.  Chest: Clear to auscultation bilaterally.  CVS: S1, S2 no murmurs, no S3.Regular rate.  ABD: Soft non tender.   Ext: No edema  MS: Adequate ROM spine, shoulders, hips and reduced in  knees.  Skin: Intact, no ulcerations or rash noted.  Psych: Good eye contact, flat  affect. Memory intact  not anxious slightly depressed appearing.and at times tearful  CNS: CN 2-12 intact, power,  normal throughout.no focal deficits noted.   Assessment & Plan  Cough Over 2 month h/o cough, associated with post nasal drainage CXR on day of visit Clinical impression is that cough is primarily due to post nasal drainage  FATIGUE Reports increased fatigue and leg swelling. Longstanding h/o hypertension , and has not had cardiology evaluation for 5 years. Refer to cardiology for re eval  Palpitations No recent c/o palpitation on current medications, however, needs Cardiology eval for increased fatigue and longstanding hypertension  Leg swelling Patient concerned about possible PAD will request cardiology evaluation for PAD also  Morbid obesity ([place]) Deteriorated.Obesity associated with hypertension and osteoarthritis  Patient re-educated about  the importance of commitment to a  minimum of 150 minutes of exercise per week as able.  The importance of healthy food choices with portion control discussed, as well as eating regularly and within a 12 hour window most days. The need to choose "clean , green" food 50 to 75% of the time is discussed, as well as to make water the primary drink and set a goal of 64 ounces water daily.  Encouraged to start a food diary,  and to consider  joining a support group. Sample diet sheets offered. Goals set by the patient for the next several months.   Weight /BMI 11/13/2018 08/21/2018 06/17/2018  WEIGHT 210 lb 209 lb 1.3  oz 208 lb 1.9 oz  HEIGHT 5\' 4"  5\' 4"  5\' 4"   BMI 36.05 kg/m2 35.89 kg/m2 35.72 kg/m2      Insomnia Good dd response to trazodone, will continue same  Sleep hygiene reviewed and written information offered also. Prescription sent for  medication needed.   Hyperlipidemia LDL goal <100 Hyperlipidemia:Low fat diet discussed and encouraged.   Lipid Panel  Lab Results  Component Value Date   CHOL 172 11/12/2018   HDL 74  11/12/2018   LDLCALC 81 11/12/2018   TRIG 86 11/12/2018   CHOLHDL 2.3 11/12/2018  Controlled, no change in medication      Depression with anxiety Controlled on current medication, despite acute grief. Continue current medications Re address in 5 months  Allergic sinusitis Reports post nasal drainage takes no allergy medication regularly and wants to use only as needed, encouraged daily use to see effect on cough

## 2018-11-16 NOTE — Assessment & Plan Note (Signed)
No recent c/o palpitation on current medications, however, needs Cardiology eval for increased fatigue and longstanding hypertension

## 2018-11-16 NOTE — Assessment & Plan Note (Signed)
Reports increased fatigue and leg swelling. Longstanding h/o hypertension , and has not had cardiology evaluation for 5 years. Refer to cardiology for re eval

## 2018-12-26 ENCOUNTER — Encounter: Payer: Self-pay | Admitting: Family Medicine

## 2018-12-26 ENCOUNTER — Ambulatory Visit (INDEPENDENT_AMBULATORY_CARE_PROVIDER_SITE_OTHER): Payer: Medicare Other | Admitting: Family Medicine

## 2018-12-26 VITALS — BP 132/80 | Resp 15 | Ht 64.0 in | Wt 210.0 lb

## 2018-12-26 DIAGNOSIS — I10 Essential (primary) hypertension: Secondary | ICD-10-CM

## 2018-12-26 DIAGNOSIS — J309 Allergic rhinitis, unspecified: Secondary | ICD-10-CM

## 2018-12-26 DIAGNOSIS — F418 Other specified anxiety disorders: Secondary | ICD-10-CM | POA: Diagnosis not present

## 2018-12-26 DIAGNOSIS — R05 Cough: Secondary | ICD-10-CM

## 2018-12-26 DIAGNOSIS — R059 Cough, unspecified: Secondary | ICD-10-CM

## 2018-12-26 MED ORDER — MONTELUKAST SODIUM 10 MG PO TABS: 10.0000 mg | ORAL_TABLET | Freq: Every day | ORAL | 3 refills | Status: DC

## 2018-12-26 MED ORDER — FLUTICASONE PROPIONATE 50 MCG/ACT NA SUSP: 2.0000 | Freq: Every day | NASAL | 6 refills | Status: DC

## 2018-12-26 MED ORDER — PREDNISONE 5 MG PO TABS: 5.0000 mg | ORAL_TABLET | Freq: Two times a day (BID) | ORAL | 0 refills | Status: AC

## 2018-12-26 NOTE — Progress Notes (Signed)
Virtual Visit via Telephone Note  I connected with Alexis Hurst on 12/26/18 at  3:20 PM EDT by telephone and verified that I am speaking with the correct person using two identifiers.   I discussed the limitations, risks, security and privacy concerns of performing an evaluation and management service by telephone and the availability of in person appointments. I also discussed with the patient that there may be a patient responsible charge related to this service. The patient expressed understanding and agreed to proceed. Patient is in her home and I am also calling from my home. Video contact desired but unable   History of Present Illness: C/o intermittent cough x 4 months, reports improved somewhat with last medication course, but has recurred once more Not taking any allergy medication, though these were prescribed at her March visit Denies fever, chills or sinus pressure , productive of scant sputum. Cough is worse at night / when lying down and she feels post nasal drainage Review of Systems  Constitutional: Negative for chills, fever and malaise/fatigue.  HENT: Negative for ear pain.   Respiratory: Positive for cough and sputum production.   Cardiovascular: Negative for chest pain and palpitations.  Gastrointestinal: Negative for abdominal pain.  Genitourinary: Negative for dysuria.  Musculoskeletal: Positive for joint pain.  Skin: Negative for rash.      Observations/Objective: BP 132/80   Resp 15   Ht 5\' 4"  (1.626 m)   Wt 210 lb (95.3 kg)   BMI 36.05 kg/m    Assessment and Plan: Allergic sinusitis persitent symptoms of post nasal drainage and cough, no fever, chills or sinus pressure. Needs to commit to daily se of same and she is re educated about this, and medications x 2 are re sent along with a short course of prednisone. If persists , she is to call back for referral to allergist  Cough 4 month h/o deep cough intermittently with abnormal CXR , referred for  Chest scan, needs to be scheduled, past due , she is advised to call back by mid week if not done  Depression with anxiety Controlled, no change in medication   Essential hypertension Controlled, no change in medication DASH diet and commitment to daily physical activity for a minimum of 30 minutes discussed and encouraged, as a part of hypertension management. The importance of attaining a healthy weight is also discussed.  BP/Weight 12/26/2018 11/13/2018 08/21/2018 06/17/2018 02/07/2018 12/06/2017 1/93/7902  Systolic BP 409 735 329 924 268 341 962  Diastolic BP 80 80 62 82 80 58 76  Wt. (Lbs) 210 210 209.08 208.12 209 209.04 -  BMI 36.05 36.05 35.89 35.72 35.87 35.88 -       Morbid obesity ([place]) Obesity associated with hypertension, and  , depression  Patient re-educated about  the importance of commitment to a  minimum of 150 minutes of exercise per week as able.  The importance of healthy food choices with portion control discussed, as well as eating regularly and within a 12 hour window most days. The need to choose "clean , green" food 50 to 75% of the time is discussed, as well as to make water the primary drink and set a goal of 64 ounces water daily.  Encouraged to start a food diary,  and to consider  joining a support group. Sample diet sheets offered. Goals set by the patient for the next several months.   Weight /BMI 12/26/2018 11/13/2018 08/21/2018  WEIGHT 210 lb 210 lb 209 lb 1.3 oz  HEIGHT  5\' 4"  5\' 4"  5\' 4"   BMI 36.05 kg/m2 36.05 kg/m2 35.89 kg/m2        Follow Up Instructions:    I discussed the assessment and treatment plan with the patient. The patient was provided an opportunity to ask questions and all were answered. The patient agreed with the plan and demonstrated an understanding of the instructions.   The patient was advised to call back or seek an in-person evaluation if the symptoms worsen or if the condition fails to improve as anticipated.  I  provided 22 minutes of non-face-to-face time during this encounter.   Tula Nakayama, MD

## 2018-12-26 NOTE — Patient Instructions (Addendum)
Follow up as before, call if you need me sooner  I have prescribed 3 medications for uncontrolled allergies, please commit to taking the montelukast and using the flonase nasal spray every day for control. Five days of prednisone are prescribed for short term use only  This will help your chronic cough tremendously and hopefully eradicate it  You will be contacted re the appointment information for your chest scan due to cough x 4 months , if you do not get a call by next Wednesday re appt info , please call us back or send a message  Thanks for choosing [facility], we consider it a privelige to serve you.   Social distancing. Frequent hand washing with soap and water Keeping your hands off of your face.Use  A face mask whenever you leave your home These 3 practices will help to keep both you and your community healthy during this time. Please practice them faithfully!

## 2018-12-27 NOTE — Assessment & Plan Note (Signed)
Controlled, no change in medication DASH diet and commitment to daily physical activity for a minimum of 30 minutes discussed and encouraged, as a part of hypertension management. The importance of attaining a healthy weight is also discussed.  BP/Weight 12/26/2018 11/13/2018 08/21/2018 06/17/2018 02/07/2018 12/06/2017 1/75/1025  Systolic BP 852 778 242 353 614 431 540  Diastolic BP 80 80 62 82 80 58 76  Wt. (Lbs) 210 210 209.08 208.12 209 209.04 -  BMI 36.05 36.05 35.89 35.72 35.87 35.88 -

## 2018-12-27 NOTE — Assessment & Plan Note (Signed)
Controlled, no change in medication  

## 2018-12-27 NOTE — Assessment & Plan Note (Signed)
persitent symptoms of post nasal drainage and cough, no fever, chills or sinus pressure. Needs to commit to daily se of same and Alexis Hurst is re educated about this, and medications x 2 are re sent along with a short course of prednisone. If persists , Alexis Hurst is to call back for referral to allergist

## 2018-12-27 NOTE — Assessment & Plan Note (Signed)
4 month h/o deep cough intermittently with abnormal CXR , referred for Chest scan, needs to be scheduled, past due , she is advised to call back by mid week if not done

## 2018-12-27 NOTE — Assessment & Plan Note (Addendum)
Obesity associated with hypertension, and  , depression  Patient re-educated about  the importance of commitment to a  minimum of 150 minutes of exercise per week as able.  The importance of healthy food choices with portion control discussed, as well as eating regularly and within a 12 hour window most days. The need to choose "clean , green" food 50 to 75% of the time is discussed, as well as to make water the primary drink and set a goal of 64 ounces water daily.  Encouraged to start a food diary,  and to consider  joining a support group. Sample diet sheets offered. Goals set by the patient for the next several months.   Weight /BMI 12/26/2018 11/13/2018 08/21/2018  WEIGHT 210 lb 210 lb 209 lb 1.3 oz  HEIGHT 5\' 4"  5\' 4"  5\' 4"   BMI 36.05 kg/m2 36.05 kg/m2 35.89 kg/m2

## 2019-01-02 ENCOUNTER — Other Ambulatory Visit: Payer: Self-pay | Admitting: Family Medicine

## 2019-01-02 DIAGNOSIS — I10 Essential (primary) hypertension: Secondary | ICD-10-CM

## 2019-01-02 DIAGNOSIS — F5105 Insomnia due to other mental disorder: Secondary | ICD-10-CM

## 2019-01-02 DIAGNOSIS — F419 Anxiety disorder, unspecified: Secondary | ICD-10-CM

## 2019-01-09 ENCOUNTER — Encounter: Payer: Self-pay | Admitting: Cardiology

## 2019-01-10 ENCOUNTER — Telehealth: Payer: Self-pay | Admitting: Cardiology

## 2019-01-10 ENCOUNTER — Encounter: Payer: Self-pay | Admitting: Cardiology

## 2019-01-10 NOTE — Progress Notes (Signed)
Virtual Visit via Telephone Note   This visit type was conducted due to national recommendations for restrictions regarding the COVID-19 Pandemic (e.g. social distancing) in an effort to limit this patient's exposure and mitigate transmission in our community.  Due to her co-morbid illnesses, this patient is at least at moderate risk for complications without adequate follow up.  This format is felt to be most appropriate for this patient at this time.  The patient did not have access to video technology/had technical difficulties with video requiring transitioning to audio format only (telephone).  All issues noted in this document were discussed and addressed.  No physical exam could be performed with this format.  Please refer to the patient's chart for her  consent to telehealth for [facility].   Date:  01/13/2019   ID:  Alexis Hurst, DOB 04-17-1951, MRN [id number]  Patient Location: Home Provider Location: Home  PCP:  Fayrene Helper, MD  Consulting Cardiologist: Satira Sark, MD   Evaluation Performed:  New Patient Evaluation  Chief Complaint:   Cardiac evaluation  History of Present Illness:    Alexis Hurst is a 68 y.o. female referred for cardiology consultation by Dr. Moshe Cipro for cardiac evaluation.  We had technical difficulties establishing video access and spoke on the phone today.  I reviewed her records and we discussed her symptoms and concerns.  She states that she recently lost her older sister at 73 years of age with progressive heart problems.  She does have some family history of heart disease but most people are in their 68s are older.  She reports dyspnea on exertion over a period of several months, also difficulty singing as easily as she has in the past.  She does not exercise, has been primary caregiver for most of her family members.  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  I reviewed her most recent lab work and ECG.  We also  went over her medications.  She reports compliance.  She was previously evaluated by Dr. Lattie Haw and subsequently Dr. Harrington Challenger, last seen in 2015.  She had a negative exercise echocardiogram for ischemia in 2010.  The patient does not have symptoms concerning for COVID-19 infection (fever, chills, cough, or new shortness of breath).    Past Medical History:  Diagnosis Date  . Anxiety   . Depression   . Dizziness    Mild Orthostatic  . Essential hypertension   . Fatigue   . History of chest pain    Negative stress echo 2010  . Hyperlipidemia   . Insomnia   . Mitral valve prolapse   . Obesity   . Paresthesias    Upper extremity /episode sensation of coldness and numbness   . Raynauds phenomenon    Possible  . Type 2 diabetes mellitus ([place])    Past Surgical History:  Procedure Laterality Date  . ABDOMINAL HYSTERECTOMY    . BREAST SURGERY    . COLONOSCOPY  06/11/2003   Smith:multiple medium scatered diverticula in the cecum, a sending colon, transverse colon, descending colon, sigmoid colon.  . COLONOSCOPY N/A 05/20/2013   TICS, SML IH  . Cystectomy L breast, benign    . VESICOVAGINAL FISTULA CLOSURE W/ TAH  1989     Current Meds  Medication Sig  . amLODipine (NORVASC) 5 MG tablet TAKE 1 TABLET(5 MG) BY MOUTH DAILY  . aspirin (ASPIRIN LOW DOSE) 81 MG EC tablet Take 81 mg by mouth daily.    Marland Kitchen  atorvastatin (LIPITOR) 10 MG tablet Take 1 tablet (10 mg total) by mouth daily.  . Calcium Carbonate-Vitamin D (CALCIUM + D) 600-200 MG-UNIT TABS Take 1 tablet by mouth daily.   . carvedilol (COREG) 6.25 MG tablet TAKE 1 TABLET BY MOUTH TWICE DAILY WITH A MEAL  . chlorpheniramine (CHLOR-TRIMETON) 4 MG tablet Take 1 tablet (4 mg total) by mouth 2 (two) times daily as needed for allergies.  . cloNIDine (CATAPRES) 0.1 MG tablet TAKE 1 TABLET BY MOUTH AT BEDTIME FOR BLOOD PRESSURE, SLEEP AND HOT FLASHES  . FLUoxetine (PROZAC) 40 MG capsule Take 1 capsule (40 mg total) by mouth daily.  .  fluticasone (FLONASE) 50 MCG/ACT nasal spray Place 2 sprays into both nostrils daily.  Marland Kitchen glucose blood (ONE TOUCH ULTRA TEST) test strip Use as instructed once daily dx e11.9  . metFORMIN (GLUCOPHAGE) 1000 MG tablet TAKE 1 TABLET BY MOUTH EVERY DAY WITH BREAKFAST  . montelukast (SINGULAIR) 10 MG tablet Take 1 tablet (10 mg total) by mouth at bedtime.  Marland Kitchen olmesartan-hydrochlorothiazide (BENICAR HCT) 20-12.5 MG tablet TAKE 1/2 TABLET BY MOUTH ONCE DAILY  . traZODone (DESYREL) 50 MG tablet Take 1 tablet (50 mg total) by mouth at bedtime.  Marland Kitchen venlafaxine XR (EFFEXOR-XR) 75 MG 24 hr capsule TAKE 1 CAPSULE(75 MG) BY MOUTH DAILY     Allergies:   Lactose intolerance (gi)   Social History   Tobacco Use  . Smoking status: Former Smoker    Types: Cigarettes    Last attempt to quit: 09/04/1986    Years since quitting: 32.3  . Smokeless tobacco: Never Used  Substance Use Topics  . Alcohol use: No    Alcohol/week: 0.0 standard drinks  . Drug use: No     Family Hx: The patient's family history includes Asthma in her sister; Colon cancer in her maternal grandfather; Crohn's disease in her brother; Diabetes in her brother; Heart failure in her father; Hypertension in her mother, sister, and sister; Lung cancer in her father; Urticaria in her mother. There is no history of Allergic rhinitis, Angioedema, Atopy, Eczema, or Immunodeficiency.  ROS:   Please see the history of present illness.    All other systems reviewed and are negative.   Prior CV studies:   The following studies were reviewed today:  Exercise echocardiogram 04/12/2009: Study Conclusions  1. Stress ECG conclusions: The stress ECG was normal. 2. Staged echo: There was no echocardiographic evidence for  stress-induced ischemia.  Labs/Other Tests and Data Reviewed:    EKG:  An ECG dated 06/17/2018 was personally reviewed today and demonstrated:  Normal sinus rhythm.  Recent Labs: 06/17/2018: Hemoglobin 12.4; Platelets 295;  TSH 0.56 11/12/2018: ALT 23; BUN 10; Creat 0.85; Potassium 4.0; Sodium 141   Recent Lipid Panel Lab Results  Component Value Date/Time   CHOL 172 11/12/2018 11:08 AM   TRIG 86 11/12/2018 11:08 AM   HDL 74 11/12/2018 11:08 AM   CHOLHDL 2.3 11/12/2018 11:08 AM   LDLCALC 81 11/12/2018 11:08 AM    Wt Readings from Last 3 Encounters:  01/13/19 210 lb (95.3 kg)  12/26/18 210 lb (95.3 kg)  11/13/18 210 lb (95.3 kg)     Objective:    Vital Signs:  BP (!) 150/90   Pulse (!) 104   Ht 5\' 4"  (1.626 m)   Wt 210 lb (95.3 kg)   BMI 36.05 kg/m    Blood pressure was 132/80 in April, 132/80 in March, 126/62 in December Patient spoke in full sentences on the  phone, not short of breath.  Voice tone and speech pattern were normal.  No audible wheezing.  ASSESSMENT & PLAN:    1.  Dyspnea on exertion in a 68 year old woman with a history of hypertension, type 2 diabetes mellitus, hyperlipidemia, documented aortic atherosclerosis by chest x-ray.  She underwent previous ischemic testing although none in several years.  Plan is to obtain an echocardiogram for cardiac structural assessment and a Lexiscan Myoview for ischemic evaluation.  2.  Essential hypertension.  She is on multimodal therapy as outlined above and follows regularly with Dr. Moshe Cipro.  3.  Mixed hyperlipidemia, on statin therapy with recent LDL 81.  4.  Type 2 diabetes mellitus, on metformin.  She follows with Dr. Moshe Cipro.  Hemoglobin A1c 6.1% in October 2019.  COVID-19 Education: The signs and symptoms of COVID-19 were discussed with the patient and how to seek care for testing (follow up with PCP or arrange E-visit).  The importance of social distancing was discussed today.  Time:   Today, I have spent 12 minutes with the patient with telehealth technology discussing the above problems.     Medication Adjustments/Labs and Tests Ordered: Current medicines are reviewed at length with the patient today.  Concerns regarding  medicines are outlined above.   Tests Ordered: Orders Placed This Encounter  Procedures  . NM Myocar Multi W/Spect W/Wall Motion / EF  . ECHOCARDIOGRAM COMPLETE    Medication Changes: No orders of the defined types were placed in this encounter.   Disposition:  Follow up test results.  Signed, Rozann Lesches, MD  01/13/2019 3:25 PM    [place]

## 2019-01-10 NOTE — Telephone Encounter (Signed)
CONSENT OBTAINED ° ° °YOUR CARDIOLOGY TEAM HAS ARRANGED FOR AN E-VISIT FOR YOUR APPOINTMENT - PLEASE REVIEW IMPORTANT INFORMATION BELOW SEVERAL DAYS PRIOR TO YOUR APPOINTMENT ° °Due to the recent COVID-19 pandemic, we are transitioning in-person office visits to tele-medicine visits in an effort to decrease unnecessary exposure to our patients and staff. Medicare and most insurances are covering these visits without a copay needed. We also encourage you to sign up for MyChart if you have not already done so. You will need a smartphone if possible. For patients that do not have this, we can still complete the visit using a regular telephone but do prefer a smartphone to enable video when possible. You may have a close family member that lives with you that can help. If possible, we also ask that you have a blood pressure cuff and scale at home to measure your blood pressure, heart rate and weight prior to your scheduled appointment. Patients with clinical needs that need an in-person evaluation and testing will still be able to come to the office if absolutely necessary. If you have any questions, feel free to call our office. ° ° ° °IF YOU HAVE A SMARTPHONE, PLEASE DOWNLOAD THE WEBEX APP TO YOUR SMARTPHONE ° °- If Apple, go to App Store and type in WebEx in the search bar. Download [employer] Webex Meetings, the blue/green circle. The app is free but as with any other app download, your phone may require you to verify saved payment information or Apple password. You do NOT have to create a WebEx account. ° °- If Android, go to Google Play Store and type in WebEx in the search bar. Download [employer] Webex Meetings, the blue/green circle. The app is free but as with any other app download, your phone may require you to verify saved payment information or Android password. You do NOT have to create a WebEx account. ° °It is very helpful to have this downloaded before your visit. ° ° ° °2-3 DAYS BEFORE YOUR APPOINTMENT ° °You  will receive a telephone call from one of our [facility] team members - your caller ID may say "Unknown caller." If this is a video visit, we will confirm that you have been able to download the WebEx app. We will remind you check your blood pressure, heart rate and weight prior to your scheduled appointment. If you have an Apple Watch or Kardia, please upload any pertinent ECG strips the day before or morning of your appointment to MyChart. Our staff will also make sure you have reviewed the consent and agree to move forward with your scheduled tele-health visit.  ° ° ° °THE DAY OF YOUR APPOINTMENT ° °Approximately 15 minutes prior to your scheduled appointment, you will receive a telephone call from one of [facility] team - your caller ID may say "Unknown caller."  Our staff will confirm medications, vital signs for the day and any symptoms you may be experiencing. Please have this information available prior to the time of visit start. It may also be helpful for you to have a pad of paper and pen handy for any instructions given during your visit. They will also walk you through joining the WebEx smartphone meeting if this is a video visit. ° ° ° °CONSENT FOR TELE-HEALTH VISIT - PLEASE REVIEW ° °I hereby voluntarily request, consent and authorize [facility] and its employed or contracted physicians, physician assistants, nurse practitioners or other licensed health care professionals (the Practitioner), to provide me with telemedicine health care   services (the “Services") as deemed necessary by the treating Practitioner. I acknowledge and consent to receive the Services by the Practitioner via telemedicine. I understand that the telemedicine visit will involve communicating with the Practitioner through live audiovisual communication technology and the disclosure of certain medical information by electronic transmission. I acknowledge that I have been given the opportunity to request an in-person assessment or  other available alternative prior to the telemedicine visit and am voluntarily participating in the telemedicine visit. ° °I understand that I have the right to withhold or withdraw my consent to the use of telemedicine in the course of my care at any time, without affecting my right to future care or treatment, and that the Practitioner or I may terminate the telemedicine visit at any time. I understand that I have the right to inspect all information obtained and/or recorded in the course of the telemedicine visit and may receive copies of available information for a reasonable fee.  I understand that some of the potential risks of receiving the Services via telemedicine include:  °• Delay or interruption in medical evaluation due to technological equipment failure or disruption; °• Information transmitted may not be sufficient (e.g. poor resolution of images) to allow for appropriate medical decision making by the Practitioner; and/or  °• In rare instances, security protocols could fail, causing a breach of personal health information. ° °Furthermore, I acknowledge that it is my responsibility to provide information about my medical history, conditions and care that is complete and accurate to the best of my ability. I acknowledge that Practitioner's advice, recommendations, and/or decision may be based on factors not within their control, such as incomplete or inaccurate data provided by me or distortions of diagnostic images or specimens that may result from electronic transmissions. I understand that the practice of medicine is not an exact science and that Practitioner makes no warranties or guarantees regarding treatment outcomes. I acknowledge that I will receive a copy of this consent concurrently upon execution via email to the email address I last provided but may also request a printed copy by calling the office of [facility].   ° °I understand that my insurance will be billed for this visit.  ° °I  have read or had this consent read to me. °• I understand the contents of this consent, which adequately explains the benefits and risks of the Services being provided via telemedicine.  °• I have been provided ample opportunity to ask questions regarding this consent and the Services and have had my questions answered to my satisfaction. °• I give my informed consent for the services to be provided through the use of telemedicine in my medical care ° °By participating in this telemedicine visit I agree to the above. ° °

## 2019-01-13 ENCOUNTER — Encounter: Payer: Self-pay | Admitting: Cardiology

## 2019-01-13 ENCOUNTER — Ambulatory Visit (INDEPENDENT_AMBULATORY_CARE_PROVIDER_SITE_OTHER): Payer: Medicare Other | Admitting: Cardiology

## 2019-01-13 VITALS — BP 150/90 | HR 104 | Ht 64.0 in | Wt 210.0 lb

## 2019-01-13 DIAGNOSIS — I10 Essential (primary) hypertension: Secondary | ICD-10-CM

## 2019-01-13 DIAGNOSIS — R0609 Other forms of dyspnea: Secondary | ICD-10-CM | POA: Diagnosis not present

## 2019-01-13 DIAGNOSIS — E782 Mixed hyperlipidemia: Secondary | ICD-10-CM | POA: Diagnosis not present

## 2019-01-13 DIAGNOSIS — Z7189 Other specified counseling: Secondary | ICD-10-CM | POA: Diagnosis not present

## 2019-01-13 DIAGNOSIS — E119 Type 2 diabetes mellitus without complications: Secondary | ICD-10-CM

## 2019-01-13 NOTE — Patient Instructions (Signed)
Medication Instructions: Your physician recommends that you continue on your current medications as directed. Please refer to the Current Medication list given to you today.   Labwork: None today  Procedures/Testing: Your physician has requested that you have an echocardiogram. Echocardiography is a painless test that uses sound waves to create images of your heart. It provides your doctor with information about the size and shape of your heart and how well your heart's chambers and valves are working. This procedure takes approximately one hour. There are no restrictions for this procedure.  Your physician has requested that you have a lexiscan myoview. For further information please visit [email or web address]. Please follow instruction sheet, as given.    Follow-Up: To be determined after testing, we will call you  Any Additional Special Instructions Will Be Listed Below (If Applicable).     If you need a refill on your cardiac medications before your next appointment, please call your pharmacy.

## 2019-01-23 ENCOUNTER — Encounter (HOSPITAL_COMMUNITY): Payer: Self-pay

## 2019-01-23 ENCOUNTER — Other Ambulatory Visit: Payer: Self-pay

## 2019-01-23 ENCOUNTER — Ambulatory Visit (HOSPITAL_COMMUNITY)
Admission: RE | Admit: 2019-01-23 | Discharge: 2019-01-23 | Disposition: A | Discharge status: Home / Self Care | Payer: Medicare Other | Source: Ambulatory Visit | Attending: Family Medicine | Admitting: Family Medicine

## 2019-01-23 ENCOUNTER — Encounter (HOSPITAL_COMMUNITY)
Admission: RE | Admit: 2019-01-23 | Discharge: 2019-01-23 | Disposition: A | Discharge status: Home / Self Care | Payer: Medicare Other | Source: Ambulatory Visit | Attending: Cardiology | Admitting: Cardiology

## 2019-01-23 ENCOUNTER — Encounter (HOSPITAL_BASED_OUTPATIENT_CLINIC_OR_DEPARTMENT_OTHER)
Admission: RE | Admit: 2019-01-23 | Discharge: 2019-01-23 | Disposition: A | Discharge status: Home / Self Care | Payer: Medicare Other | Source: Ambulatory Visit | Attending: Cardiology | Admitting: Cardiology

## 2019-01-23 ENCOUNTER — Ambulatory Visit (HOSPITAL_COMMUNITY)
Admission: RE | Admit: 2019-01-23 | Discharge: 2019-01-23 | Disposition: A | Discharge status: Home / Self Care | Payer: Medicare Other | Source: Ambulatory Visit | Attending: Cardiology | Admitting: Cardiology

## 2019-01-23 DIAGNOSIS — R0609 Other forms of dyspnea: Secondary | ICD-10-CM | POA: Insufficient documentation

## 2019-01-23 DIAGNOSIS — R05 Cough: Secondary | ICD-10-CM | POA: Insufficient documentation

## 2019-01-23 DIAGNOSIS — R059 Cough, unspecified: Secondary | ICD-10-CM

## 2019-01-23 LAB — NM MYOCAR MULTI W/SPECT W/WALL MOTION / EF
LV dias vol: 35 mL (ref 46–106)
LV sys vol: 9 mL
Peak HR: 112 {beats}/min
RATE: 0.31
Rest HR: 92 {beats}/min
SDS: 3
SRS: 1
SSS: 4
TID: 1.02

## 2019-01-23 MED ORDER — TECHNETIUM TC 99M TETROFOSMIN IV KIT
30.0000 | PACK | Freq: Once | INTRAVENOUS | Status: AC | PRN
  Administered 2019-01-23: 28 via INTRAVENOUS

## 2019-01-23 MED ORDER — SODIUM CHLORIDE 0.9% FLUSH
INTRAVENOUS | Status: AC
  Administered 2019-01-23: 09:00:00
  Filled 2019-01-23: qty 10

## 2019-01-23 MED ORDER — TECHNETIUM TC 99M TETROFOSMIN IV KIT
10.0000 | PACK | Freq: Once | INTRAVENOUS | Status: AC | PRN
  Administered 2019-01-23: 8 via INTRAVENOUS

## 2019-01-23 MED ORDER — REGADENOSON 0.4 MG/5ML IV SOLN
INTRAVENOUS | Status: AC
  Administered 2019-01-23: 09:00:00
  Filled 2019-01-23: qty 5

## 2019-01-23 NOTE — Progress Notes (Signed)
*  PRELIMINARY RESULTS* Echocardiogram 2D Echocardiogram has been performed.  Alexis Hurst 01/23/2019, 10:30 AM

## 2019-02-05 ENCOUNTER — Ambulatory Visit (INDEPENDENT_AMBULATORY_CARE_PROVIDER_SITE_OTHER): Payer: Medicare Other | Admitting: Family Medicine

## 2019-02-05 ENCOUNTER — Encounter: Payer: Self-pay | Admitting: Family Medicine

## 2019-02-05 ENCOUNTER — Ambulatory Visit (HOSPITAL_COMMUNITY): Payer: Medicare Other

## 2019-02-05 ENCOUNTER — Other Ambulatory Visit: Payer: Self-pay

## 2019-02-05 ENCOUNTER — Ambulatory Visit (HOSPITAL_COMMUNITY)
Admission: RE | Admit: 2019-02-05 | Discharge: 2019-02-05 | Disposition: A | Discharge status: Home / Self Care | Payer: Medicare Other | Source: Ambulatory Visit | Attending: Family Medicine | Admitting: Family Medicine

## 2019-02-05 VITALS — BP 134/82 | HR 102 | Ht 64.0 in | Wt 216.0 lb

## 2019-02-05 DIAGNOSIS — J328 Other chronic sinusitis: Secondary | ICD-10-CM

## 2019-02-05 DIAGNOSIS — E785 Hyperlipidemia, unspecified: Secondary | ICD-10-CM | POA: Diagnosis not present

## 2019-02-05 DIAGNOSIS — R059 Cough, unspecified: Secondary | ICD-10-CM

## 2019-02-05 DIAGNOSIS — J309 Allergic rhinitis, unspecified: Secondary | ICD-10-CM

## 2019-02-05 DIAGNOSIS — K219 Gastro-esophageal reflux disease without esophagitis: Secondary | ICD-10-CM

## 2019-02-05 DIAGNOSIS — R9389 Abnormal findings on diagnostic imaging of other specified body structures: Secondary | ICD-10-CM

## 2019-02-05 DIAGNOSIS — R7303 Prediabetes: Secondary | ICD-10-CM

## 2019-02-05 DIAGNOSIS — R05 Cough: Secondary | ICD-10-CM

## 2019-02-05 DIAGNOSIS — I10 Essential (primary) hypertension: Secondary | ICD-10-CM

## 2019-02-05 DIAGNOSIS — R1310 Dysphagia, unspecified: Secondary | ICD-10-CM

## 2019-02-05 DIAGNOSIS — F418 Other specified anxiety disorders: Secondary | ICD-10-CM

## 2019-02-05 MED ORDER — PANTOPRAZOLE SODIUM 40 MG PO TBEC: 40.0000 mg | DELAYED_RELEASE_TABLET | Freq: Every day | ORAL | 3 refills | Status: DC

## 2019-02-05 MED ORDER — AZELASTINE HCL 0.1 % NA SOLN: 2.0000 | Freq: Two times a day (BID) | NASAL | 12 refills | Status: DC

## 2019-02-05 MED ORDER — PROMETHAZINE-DM 6.25-15 MG/5ML PO SYRP: ORAL_SOLUTION | ORAL | 0 refills | Status: DC

## 2019-02-05 MED ORDER — METHYLPREDNISOLONE ACETATE 80 MG/ML IJ SUSP
80.0000 mg | Freq: Once | INTRAMUSCULAR | Status: AC
  Administered 2019-02-05: 80 mg via INTRAMUSCULAR

## 2019-02-05 MED ORDER — PREDNISONE 10 MG PO TABS: ORAL_TABLET | ORAL | 0 refills | Status: DC

## 2019-02-05 MED ORDER — SPIRONOLACTONE 25 MG PO TABS: 25.0000 mg | ORAL_TABLET | Freq: Every day | ORAL | 3 refills | Status: DC

## 2019-02-05 NOTE — Patient Instructions (Addendum)
F/U in 10 weeks, call if you need me before  Please stop olmesartan/ hctz, this may be causing you to cough  New for your blood pressure is spironolactone 25 mg daily  Please get sinus x ray today  Depomedrol 80 mg IM in office today, prednisone short term by mouth, cough suppressant syrup at bedtime as needed, short term and astellin nasal spray all are new, continue current allergy medication  You are referred to [facility]  to evaluate your chronic cough and review you chest scan with f/u plan  Please STOP caffeine, tea and sodas  And coffee as this makes reflux worse  New for reflux is protonix 40 mg one daily, and you will be referred to Dr Oneida Alar  To further evaluate this as you report food sticking in the past approx 6 months since you have been coughing constantly  Fasting lipid, cmp and EGFR 1 weelk before next visit and HBA1C  Thanks for choosing [facility], we consider it a privelige to serve you.

## 2019-02-06 ENCOUNTER — Encounter: Payer: Self-pay | Admitting: Family Medicine

## 2019-02-07 ENCOUNTER — Encounter: Payer: Self-pay | Admitting: Family Medicine

## 2019-02-09 ENCOUNTER — Encounter: Payer: Self-pay | Admitting: Family Medicine

## 2019-02-09 DIAGNOSIS — R9389 Abnormal findings on diagnostic imaging of other specified body structures: Secondary | ICD-10-CM | POA: Insufficient documentation

## 2019-02-09 DIAGNOSIS — K219 Gastro-esophageal reflux disease without esophagitis: Secondary | ICD-10-CM | POA: Insufficient documentation

## 2019-02-09 NOTE — Progress Notes (Signed)
Alexis Hurst     MRN: [id number]      DOB: [birth year]   HPI [name] is here for follow up and re-evaluation of chronic medical conditions, medication management and review of any available recent lab and radiology data.  Preventive health is updated, specifically  Cancer screening and Immunization.   Questions or concerns regarding consultations or procedures which the PT has had in the interim are  addressed. The PT denies any adverse reactions to current medications since the last visit.  There are no new concerns.  There are no specific complaints   ROS Denies recent fever or chills. Denies sinus pressure, nasal congestion, ear pain or sore throat. Denies chest congestion, c/o chronic disabling cough and is here to discuss recent chest scan and management of her ongoing cough C/o dysphagia Denies chest pains, palpitations and leg swelling Denies abdominal pain, nausea, vomiting,diarrhea or constipation.   Denies dysuria, frequency, hesitancy or incontinence. Denies joint pain, swelling and limitation in mobility. Denies headaches, seizures, numbness, or tingling. Denies uncontrolled  depression, anxiety or insomnia. Denies skin break down or rash.   PE  BP 134/82   Pulse (!) 102   Ht 5\' 4"  (1.626 m)   Wt 216 lb (98 kg)   SpO2 97%   BMI 37.08 kg/m   Patient alert and oriented and in no cardiopulmonary distress.  HEENT: No facial asymmetry, EOMI,   oropharynx pink and moist.  Neck supple no JVD, no mass.  Chest: Clear to auscultation bilaterally.  CVS: S1, S2 no murmurs, no S3.Regular rate.  Ext: No edema  MS: Adequate ROM spine, shoulders, hips and knees.  Skin: Intact, no ulcerations or rash noted.  Psych: Good eye contact, normal affect. Memory intact not anxious or depressed appearing.  CNS: CN 2-12 intact, power,  normal throughout.no focal deficits noted.   Assessment & Plan  Cough Multifactorial, however not infectious. Stop ARB, maximize  treatment of GERD and allergies, refer to pulmonary/ Allergy for assistance. Add symptomatic meds, tessalon perles and phenergan DM  Depression with anxiety Controlled, no change in medication   Essential hypertension Controlled , but medication changed as ARB may be contributing to caough. New is spironolactone Re eval in 10 weeks DASH diet and commitment to daily physical activity for a minimum of 30 minutes discussed and encouraged, as a part of hypertension management. The importance of attaining a healthy weight is also discussed.  BP/Weight 02/05/2019 01/13/2019 12/26/2018 11/13/2018 08/21/2018 05/39/7673 12/03/9377  Systolic BP 024 097 353 299 242 683 419  Diastolic BP 82 90 80 80 62 82 80  Wt. (Lbs) 216 210 210 210 209.08 208.12 209  BMI 37.08 36.05 36.05 36.05 35.89 35.72 35.87       GERD (gastroesophageal reflux disease) Chronic cough and dysphagia, start protonix and refer to GI , educate re stopping caffeine and weight loss   Allergic sinusitis Depo medrol 80 mg iM and short courswe of steroids, add astelin and refer to pulmonary allergy, sinus X ray  Abnormal CT scan, chest Refer to [facility]  Morbid obesity ([place]) Deteriorated.Obesity linked with hypertension, arthritis and depression  Patient re-educated about  the importance of commitment to a  minimum of 150 minutes of exercise per week as able.  The importance of healthy food choices with portion control discussed, as well as eating regularly and within a 12 hour window most days. The need to choose "clean , green" food 50 to 75% of the time is discussed,  as well as to make water the primary drink and set a goal of 64 ounces water daily.  Encouraged to start a food diary,  and to consider  joining a support group. Sample diet sheets offered. Goals set by the patient for the next several months.   Weight /BMI 02/05/2019 01/13/2019 12/26/2018  WEIGHT 216 lb 210 lb 210 lb  HEIGHT 5\' 4"  5\' 4"  5\' 4"   BMI 37.08  kg/m2 36.05 kg/m2 36.05 kg/m2

## 2019-02-09 NOTE — Assessment & Plan Note (Signed)
Chronic cough and dysphagia, start protonix and refer to GI , educate re stopping caffeine and weight loss

## 2019-02-09 NOTE — Assessment & Plan Note (Addendum)
Depo medrol 80 mg iM and short courswe of steroids, add astelin and refer to pulmonary allergy, sinus X ray

## 2019-02-09 NOTE — Assessment & Plan Note (Signed)
Controlled , but medication changed as ARB may be contributing to caough. New is spironolactone Re eval in 10 weeks DASH diet and commitment to daily physical activity for a minimum of 30 minutes discussed and encouraged, as a part of hypertension management. The importance of attaining a healthy weight is also discussed.  BP/Weight 02/05/2019 01/13/2019 12/26/2018 11/13/2018 08/21/2018 83/77/9396 04/12/6483  Systolic BP 720 721 828 833 744 514 604  Diastolic BP 82 90 80 80 62 82 80  Wt. (Lbs) 216 210 210 210 209.08 208.12 209  BMI 37.08 36.05 36.05 36.05 35.89 35.72 35.87

## 2019-02-09 NOTE — Assessment & Plan Note (Signed)
Deteriorated.Obesity linked with hypertension, arthritis and depression  Patient re-educated about  the importance of commitment to a  minimum of 150 minutes of exercise per week as able.  The importance of healthy food choices with portion control discussed, as well as eating regularly and within a 12 hour window most days. The need to choose "clean , green" food 50 to 75% of the time is discussed, as well as to make water the primary drink and set a goal of 64 ounces water daily.  Encouraged to start a food diary,  and to consider  joining a support group. Sample diet sheets offered. Goals set by the patient for the next several months.   Weight /BMI 02/05/2019 01/13/2019 12/26/2018  WEIGHT 216 lb 210 lb 210 lb  HEIGHT 5\' 4"  5\' 4"  5\' 4"   BMI 37.08 kg/m2 36.05 kg/m2 36.05 kg/m2

## 2019-02-09 NOTE — Assessment & Plan Note (Signed)
Controlled, no change in medication  

## 2019-02-09 NOTE — Assessment & Plan Note (Signed)
Multifactorial, however not infectious. Stop ARB, maximize treatment of GERD and allergies, refer to pulmonary/ Allergy for assistance. Add symptomatic meds, tessalon perles and phenergan DM

## 2019-02-09 NOTE — Assessment & Plan Note (Signed)
Refer to [facility]is [facility]

## 2019-02-10 ENCOUNTER — Encounter: Payer: Self-pay | Admitting: Gastroenterology

## 2019-03-03 ENCOUNTER — Other Ambulatory Visit: Payer: Self-pay | Admitting: Family Medicine

## 2019-03-04 ENCOUNTER — Other Ambulatory Visit: Payer: Self-pay | Admitting: Family Medicine

## 2019-03-05 ENCOUNTER — Other Ambulatory Visit: Payer: Self-pay | Admitting: Family Medicine

## 2019-03-12 ENCOUNTER — Other Ambulatory Visit: Payer: Self-pay | Admitting: Family Medicine

## 2019-03-12 ENCOUNTER — Telehealth: Payer: Self-pay | Admitting: *Deleted

## 2019-03-12 NOTE — Telephone Encounter (Signed)
Pt and [facility] called back to back from each other wondering why her venlafaxine had been denied for refills. Pt requesting call back at [phone]

## 2019-03-13 ENCOUNTER — Other Ambulatory Visit: Payer: Self-pay

## 2019-03-13 ENCOUNTER — Telehealth: Payer: Self-pay | Admitting: Family Medicine

## 2019-03-13 MED ORDER — VENLAFAXINE HCL ER 75 MG PO CP24: 75.0000 mg | ORAL_CAPSULE | Freq: Every day | ORAL | 1 refills | Status: DC

## 2019-03-13 NOTE — Telephone Encounter (Signed)
Spoke with patient and let her know that medication was refilled on 03/05/2019 #90 with 1 refill and confirmed received by the pharmacy. She verbalized understanding.

## 2019-03-13 NOTE — Telephone Encounter (Signed)
[facility] LVM for someone to call them back regarding prescription

## 2019-03-13 NOTE — Telephone Encounter (Signed)
Spoke with Cathie Olden at [facility] and she stated the pharmacy never received the refill. Medication refilled again

## 2019-03-13 NOTE — Telephone Encounter (Signed)
PT called returning a call from the office

## 2019-04-01 ENCOUNTER — Encounter: Payer: Self-pay | Admitting: Gastroenterology

## 2019-04-01 ENCOUNTER — Telehealth: Payer: Self-pay | Admitting: Gastroenterology

## 2019-04-01 ENCOUNTER — Ambulatory Visit: Payer: Medicare Other | Admitting: Gastroenterology

## 2019-04-01 NOTE — Telephone Encounter (Signed)
Patient was a no show and letter sent  °

## 2019-04-03 ENCOUNTER — Ambulatory Visit (INDEPENDENT_AMBULATORY_CARE_PROVIDER_SITE_OTHER): Payer: Medicare Other | Admitting: Pulmonary Disease

## 2019-04-03 ENCOUNTER — Encounter: Payer: Self-pay | Admitting: Pulmonary Disease

## 2019-04-03 ENCOUNTER — Other Ambulatory Visit: Payer: Self-pay

## 2019-04-03 VITALS — BP 128/84 | HR 101 | Temp 98.4°F | Ht 64.0 in | Wt 216.0 lb

## 2019-04-03 DIAGNOSIS — R053 Chronic cough: Secondary | ICD-10-CM

## 2019-04-03 DIAGNOSIS — R9389 Abnormal findings on diagnostic imaging of other specified body structures: Secondary | ICD-10-CM

## 2019-04-03 DIAGNOSIS — R05 Cough: Secondary | ICD-10-CM | POA: Diagnosis not present

## 2019-04-03 MED ORDER — ALBUTEROL SULFATE HFA 108 (90 BASE) MCG/ACT IN AERS: 2.0000 | INHALATION_SPRAY | Freq: Four times a day (QID) | RESPIRATORY_TRACT | 6 refills | Status: DC | PRN

## 2019-04-03 NOTE — Progress Notes (Signed)
Subjective:   PATIENT ID: Alexis Hurst GENDER: female DOB: [birth year], MRN: [id number]   HPI  Chief Complaint  Patient presents with  . Consult    Abnormal CT and cough    Reason for Visit: New consult for cough and abnormal CT  Mr. Caprice Mccaffrey is a 68 year old female with HTN, GERD who presents for chronic cough.  She reports cough started in December. Denies preceding illness. Seen by PCP. She was prescribed by prednisone did not help. Follow-up was delayed to pandemic and family (sister) passing.  Previously sang in choir but now not able to due to lack of stamina. She reports her coughing is associate with shortness of breath and wheezing. Worsens with laying down at night. Denies association with certain locations, no difference indoors and outdoors. She reports her lungs feel dirty. Something in the air would trigger her cough but unclear of what things cause it.  When on combination of low-dose prednisone x 1 week and nasal sprays, this helped. Tried pantoprazole and unsure if it helped. Cough is now improved by 90% compared to when it started. She previously had severe coughing fits into the late winter. Currently her cough is occasionally productive. Associated with wheezing and sensation of mucous in her throat.She underwent CT chest in May She reports 35lb weight gain in the last three years. Denies fevers, chills, chest pain, lower extremity swelling.  No childhood asthma.  Social History: Smoked as a teenager. Quit in 1980s. 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  Past Medical History:  Diagnosis Date  . Anxiety   . Depression   . Dizziness    Mild Orthostatic  . Essential hypertension   . Fatigue   . History of chest pain    Negative stress echo 2010  . Hyperlipidemia   . Insomnia   . Mitral valve prolapse   .  Obesity   . Paresthesias    Upper extremity /episode sensation of coldness and numbness   . Raynauds phenomenon    Possible  . Type 2 diabetes mellitus (HCC)      Family History  Problem Relation Age of Onset  . Hypertension Mother   . Urticaria Mother   . Heart failure Father   . Lung cancer Father   . Asthma Sister   . Hypertension Sister   . Hypertension Sister   . Diabetes Brother   . Colon cancer Maternal Grandfather        Age greater than 30  . Crohn's disease Brother   . Allergic rhinitis Neg Hx   . Angioedema Neg Hx   . Atopy Neg Hx   . Eczema Neg Hx   . Immunodeficiency Neg Hx      Social History   Occupational History  . Occupation: Pharmacist, hospital   Tobacco Use  . Smoking status: Former Smoker    Types: Cigarettes    Quit date: 09/04/1986    Years since quitting: 32.6  . Smokeless tobacco: Never Used  Substance and Sexual Activity  . Alcohol use: No    Alcohol/week: 0.0 standard drinks  . Drug use: No  . Sexual activity: Not on file    Allergies  Allergen Reactions  . Olmesartan Cough  . Lactose Intolerance (Gi) Diarrhea and Nausea And Vomiting     Outpatient Medications Prior to Visit  Medication Sig Dispense Refill  . amLODipine (NORVASC) 5 MG  tablet TAKE 1 TABLET(5 MG) BY MOUTH DAILY 90 tablet 1  . aspirin (ASPIRIN LOW DOSE) 81 MG EC tablet Take 81 mg by mouth daily.      Marland Kitchen atorvastatin (LIPITOR) 10 MG tablet Take 1 tablet (10 mg total) by mouth daily. 90 tablet 2  . azelastine (ASTELIN) 0.1 % nasal spray Place 2 sprays into both nostrils 2 (two) times daily. Use in each nostril as directed 30 mL 12  . Calcium Carbonate-Vitamin D (CALCIUM + D) 600-200 MG-UNIT TABS Take 1 tablet by mouth daily.     . carvedilol (COREG) 6.25 MG tablet TAKE 1 TABLET BY MOUTH TWICE DAILY WITH A MEAL 180 tablet 1  . chlorpheniramine (CHLOR-TRIMETON) 4 MG tablet Take 1 tablet (4 mg total) by mouth 2 (two) times daily as needed for allergies. 14 tablet 0  . cloNIDine  (CATAPRES) 0.1 MG tablet TAKE 1 TABLET BY MOUTH AT BEDTIME FOR BLOOD PRESSURE, SLEEP AND HOT FLASHES 30 tablet 5  . FLUoxetine (PROZAC) 40 MG capsule Take 1 capsule (40 mg total) by mouth daily. 30 capsule 5  . fluticasone (FLONASE) 50 MCG/ACT nasal spray Place 2 sprays into both nostrils daily. 16 g 6  . glucose blood (ONE TOUCH ULTRA TEST) test strip Use as instructed once daily dx e11.9 50 each 5  . metFORMIN (GLUCOPHAGE) 1000 MG tablet TAKE 1 TABLET BY MOUTH EVERY DAY WITH BREAKFAST 90 tablet 1  . montelukast (SINGULAIR) 10 MG tablet Take 1 tablet (10 mg total) by mouth at bedtime. 30 tablet 3  . pantoprazole (PROTONIX) 40 MG tablet Take 1 tablet (40 mg total) by mouth daily. 30 tablet 3  . predniSONE (DELTASONE) 10 MG tablet Take one tablet three times daily for 2 days, then one tablet two times daily for 2 days, then one tablet once daily for 3 days 13 tablet 0  . promethazine-dextromethorphan (PROMETHAZINE-DM) 6.25-15 MG/5ML syrup One teaspoon at bedtime as needed, for excessive coough 180 mL 0  . spironolactone (ALDACTONE) 25 MG tablet Take 1 tablet (25 mg total) by mouth daily. 30 tablet 3  . traZODone (DESYREL) 50 MG tablet Take 1 tablet (50 mg total) by mouth at bedtime. 30 tablet 5  . venlafaxine XR (EFFEXOR-XR) 75 MG 24 hr capsule Take 1 capsule (75 mg total) by mouth daily with breakfast. 90 capsule 1   No facility-administered medications prior to visit.     Review of Systems  Constitutional: Positive for diaphoresis and malaise/fatigue. Negative for chills, fever and weight loss.  HENT: Negative for congestion, ear pain and sore throat.   Respiratory: Positive for cough and sputum production. Negative for hemoptysis, shortness of breath and wheezing.   Cardiovascular: Negative for chest pain, palpitations and leg swelling.  Gastrointestinal: Negative for abdominal pain, heartburn and nausea.  Genitourinary: Negative for frequency.  Musculoskeletal: Negative for joint pain and  myalgias.  Skin: Negative for itching and rash.  Neurological: Negative for dizziness, weakness and headaches.  Endo/Heme/Allergies: Does not bruise/bleed easily.  Psychiatric/Behavioral: Negative for depression. The patient is not nervous/anxious.      Objective:   Vitals:   04/03/19 1048  BP: 128/84  Pulse: (!) 101  Temp: 98.4 F (36.9 C)  TempSrc: Oral  SpO2: 98%  Weight: 216 lb (98 kg)  Height: 5\' 4"  (1.626 m)      Physical Exam: General: Well-appearing, no acute distress HENT: [place], AT, OP clear, MMM Eyes: EOMI, no scleral icterus Respiratory: Clear to auscultation bilaterally.  No crackles, wheezing or rales  Cardiovascular: RRR, -M/R/G, no JVD GI: BS+, soft, nontender Extremities:-Edema,-tenderness Neuro: AAO x4, CNII-XII grossly intact Skin: Intact, no rashes or bruising Psych: Normal mood, normal affect  Data Reviewed:  Imaging: CT Chest 01/23/19 - Minimal scarring in lingula, minimal parseptal emphysema, tiny pulmonary nodules <45mm  PFT: None on file  Labs: 11/12/18 CMP. Overall normal. CO2 30  Imaging, labs and tests noted above have been reviewed independently by me.    Assessment & Plan:   Discussion: 68 year old female with HTN, GERD who presents for chronic cough abnormal CT. Cough has resolved have steroids and PPI therapy. This likely represents chronic bronchitis however will obtain PFTs to rule out underlying lung disease. We reviewed CT Chest imaging together in-clinic. Her lung nodules are <17mm and with her 8pack smoking history, she is considered low risk for malignancy.   Chronic cough Medications:  Albuterol inhaler as needed for shortness of breath or wheezing Tests:  Pulmonary function test ordered  Tiny pulmonary nodules Low risk for malignancy. Would not recommend further imaging.  Health Maintenance Pneumonia - Pneumovax 04/20/14, Prevnar 06/17/18 Influenza 06/17/18  Greater than 50% of this patient 25-minute office visit was  spent face-to-face in counseling with the patient/family. We discussed medical diagnosis and treatment plan as noted.  Orders Placed This Encounter  Procedures  . Pulmonary function test    Standing Status:   Future    Standing Expiration Date:   04/02/2020    Order Specific Question:   Where should this test be performed?    Answer:   [facility]    Order Specific Question:   Full PFT: includes the following: basic spirometry, spirometry pre & post bronchodilator, diffusion capacity (DLCO), lung volumes    Answer:   Full PFT   Meds ordered this encounter  Medications  . albuterol (VENTOLIN HFA) 108 (90 Base) MCG/ACT inhaler    Sig: Inhale 2 puffs into the lungs every 6 (six) hours as needed for wheezing or shortness of breath.    Dispense:  8 g    Refill:  6    No follow-ups on file.  Corte [place], MD [facility] 04/03/2019 9:42 AM  Office Number [phone]

## 2019-04-03 NOTE — Patient Instructions (Signed)
Take albuterol as needed  Will schedule PFT in 1 month and follow up with Dr. Loanne Drilling   Call sooner if you need anything

## 2019-04-14 ENCOUNTER — Ambulatory Visit: Payer: Self-pay | Admitting: Family Medicine

## 2019-04-14 ENCOUNTER — Ambulatory Visit (INDEPENDENT_AMBULATORY_CARE_PROVIDER_SITE_OTHER): Payer: Medicare Other | Admitting: Family Medicine

## 2019-04-14 ENCOUNTER — Other Ambulatory Visit: Payer: Self-pay

## 2019-04-14 ENCOUNTER — Encounter: Payer: Self-pay | Admitting: Family Medicine

## 2019-04-14 VITALS — BP 140/80 | HR 109 | Temp 98.3°F | Resp 15 | Ht 64.0 in | Wt 217.0 lb

## 2019-04-14 DIAGNOSIS — F5101 Primary insomnia: Secondary | ICD-10-CM | POA: Diagnosis not present

## 2019-04-14 DIAGNOSIS — F418 Other specified anxiety disorders: Secondary | ICD-10-CM

## 2019-04-14 DIAGNOSIS — I10 Essential (primary) hypertension: Secondary | ICD-10-CM

## 2019-04-14 DIAGNOSIS — M549 Dorsalgia, unspecified: Secondary | ICD-10-CM | POA: Diagnosis not present

## 2019-04-14 MED ORDER — CLONIDINE HCL 0.2 MG PO TABS: 0.2000 mg | ORAL_TABLET | Freq: Every day | ORAL | 2 refills | Status: DC

## 2019-04-14 MED ORDER — PREDNISONE 10 MG PO TABS: 10.0000 mg | ORAL_TABLET | Freq: Two times a day (BID) | ORAL | 0 refills | Status: AC

## 2019-04-14 MED ORDER — KETOROLAC TROMETHAMINE 60 MG/2ML IM SOLN
60.0000 mg | Freq: Once | INTRAMUSCULAR | Status: AC
  Administered 2019-04-14: 17:00:00 60 mg via INTRAMUSCULAR

## 2019-04-14 MED ORDER — METHYLPREDNISOLONE ACETATE 80 MG/ML IJ SUSP
80.0000 mg | Freq: Once | INTRAMUSCULAR | Status: AC
  Administered 2019-04-14: 17:00:00 80 mg via INTRAMUSCULAR

## 2019-04-14 MED ORDER — CYCLOBENZAPRINE HCL 10 MG PO TABS: 10.0000 mg | ORAL_TABLET | Freq: Every day | ORAL | 0 refills | Status: DC

## 2019-04-14 NOTE — Assessment & Plan Note (Addendum)
Uncontrolled increased dose of clonidine 2.2 mg daily reevaluate in 6 to 8 weeks. DASH diet and commitment to daily physical activity for a minimum of 30 minutes discussed and encouraged, as a part of hypertension management. The importance of attaining a healthy weight is also discussed.  BP/Weight 04/14/2019 04/03/2019 02/05/2019 01/13/2019 12/26/2018 11/13/2018 40/97/3532  Systolic BP 992 426 834 196 222 979 892  Diastolic BP 80 84 82 90 80 80 62  Wt. (Lbs) 217 216 216 210 210 210 209.08  BMI 37.25 37.08 37.08 36.05 36.05 36.05 35.89

## 2019-04-14 NOTE — Patient Instructions (Addendum)
Annual physical exam with MD , and  to reevaluate blood pressure in 6 to 8 weeks call if you need me sooner.  Increased dose of clonidine to 0.2 mg 1 at bedtime, you may take to 0.1 mg tablets together until done if necessary.  For acute back pain for the past 3 days Toradol 60 mg IM and Depo-Medrol 80 mg IM administered.  A 5-day course of prednisone is prescribed as is a muscle relaxant for bedtime use only.    Since sleep is much improved trazodone has been discontinued and also clonidine does have a sleepy side effect  Please get fasting labs ordered in June 1 week before your next appointment in the office

## 2019-04-16 ENCOUNTER — Other Ambulatory Visit: Payer: Self-pay | Admitting: Family Medicine

## 2019-04-20 ENCOUNTER — Encounter: Payer: Self-pay | Admitting: Family Medicine

## 2019-04-20 NOTE — Progress Notes (Signed)
   Alexis Hurst     MRN: [id number]      DOB: 17-Jun-1951   HPI Alexis Hurst is here with a 5 day history of acute back pain radiating down the leg.  The pain started when she was moving furniture.  She denies any new weakness or numbness.  She denies any incontinence of stool or urine.  Patient also has a concern that her heart rate tends to be over 100 most of the time and wonders if this is normal.  She denies chest pain palpitations PND or orthopnea.  ROS Denies recent fever or chills. Denies sinus pressure, nasal congestion, ear pain or sore throat. Denies chest congestion, productive cough or vomiting,diarrhea or constipation.   Denies dysuria, frequency, hesitancy or incontinence.  Denies headaches, seizures, numbness, or tingling. Denies depression, anxiety or insomnia. Denies skin break down or rash.   PE  BP 140/80   Pulse (!) 109   Temp 98.3 F (36.8 C) (Temporal)   Resp 15   Ht 5\' 4"  (1.626 m)   Wt 217 lb (98.4 kg)   SpO2 97%   BMI 37.25 kg/m   Patient alert and oriented and in no cardiopulmonary distress.  HEENT: No facial asymmetry, EOMI,   oropharynx pink and moist.  Neck supple no JVD, no mass.  Chest: Clear to auscultation bilaterally.  CVS: S1, S2 no murmurs, no S3.Regular rate.  ABD: Soft non tender.   Ext: No edema  MS: Decreased ROM lumbar  Spine,adequate in  shoulders, hips and knees.  Skin: Intact, no ulcerations or rash noted.  Psych: Good eye contact, normal affect. Memory intact not anxious or depressed appearing.  CNS: CN 2-12 intact, power,  normal throughout.no focal deficits noted.   Assessment & Plan  Essential hypertension Uncontrolled increased dose of clonidine 2.2 mg daily reevaluate in 6 to 8 weeks. DASH diet and commitment to daily physical activity for a minimum of 30 minutes discussed and encouraged, as a part of hypertension management. The importance of attaining a healthy weight is also discussed.  BP/Weight  04/14/2019 04/03/2019 02/05/2019 01/13/2019 12/26/2018 11/13/2018 01/74/9449  Systolic BP 675 916 384 665 993 570 177  Diastolic BP 80 84 82 90 80 80 62  Wt. (Lbs) 217 216 216 210 210 210 209.08  BMI 37.25 37.08 37.08 36.05 36.05 36.05 35.89       Back pain with radiation Uncontrolled.Toradol and depo medrol administered IM in the office , to be followed by a short course of oral prednisone and muscle relaxants.   Depression with anxiety Controlled, no change in medication   Insomnia Resolved, no medication needed, hence discontinued  Morbid obesity ([place]) Obesity linked with hypertension and arthritis.  Patient re-educated about  the importance of commitment to a  minimum of 150 minutes of exercise per week as able.  The importance of healthy food choices with portion control discussed, as well as eating regularly and within a 12 hour window most days. The need to choose "clean , green" food 50 to 75% of the time is discussed, as well as to make water the primary drink and set a goal of 64 ounces water daily.    Weight /BMI 04/14/2019 04/03/2019 02/05/2019  WEIGHT 217 lb 216 lb 216 lb  HEIGHT 5\' 4"  5\' 4"  5\' 4"   BMI 37.25 kg/m2 37.08 kg/m2 37.08 kg/m2

## 2019-04-20 NOTE — Assessment & Plan Note (Signed)
Resolved, no medication needed, hence discontinued

## 2019-04-20 NOTE — Assessment & Plan Note (Signed)
Controlled, no change in medication  

## 2019-04-20 NOTE — Assessment & Plan Note (Signed)
Uncontrolled.Toradol and depo medrol administered IM in the office , to be followed by a short course of oral prednisone and muscle relaxants.

## 2019-04-20 NOTE — Assessment & Plan Note (Signed)
Obesity linked with hypertension and arthritis.  Patient re-educated about  the importance of commitment to a  minimum of 150 minutes of exercise per week as able.  The importance of healthy food choices with portion control discussed, as well as eating regularly and within a 12 hour window most days. The need to choose "clean , green" food 50 to 75% of the time is discussed, as well as to make water the primary drink and set a goal of 64 ounces water daily.    Weight /BMI 04/14/2019 04/03/2019 02/05/2019  WEIGHT 217 lb 216 lb 216 lb  HEIGHT 5\' 4"  5\' 4"  5\' 4"   BMI 37.25 kg/m2 37.08 kg/m2 37.08 kg/m2

## 2019-04-21 NOTE — Addendum Note (Signed)
Addended by: Eual Fines on: 04/21/2019 08:49 AM   Modules accepted: Orders

## 2019-04-28 ENCOUNTER — Ambulatory Visit (INDEPENDENT_AMBULATORY_CARE_PROVIDER_SITE_OTHER): Payer: Medicare Other

## 2019-04-28 ENCOUNTER — Other Ambulatory Visit: Payer: Self-pay

## 2019-04-28 DIAGNOSIS — Z23 Encounter for immunization: Secondary | ICD-10-CM | POA: Diagnosis not present

## 2019-04-28 NOTE — Progress Notes (Signed)
Flu vaccine given.

## 2019-05-06 ENCOUNTER — Telehealth: Payer: Self-pay | Admitting: Pulmonary Disease

## 2019-05-06 NOTE — Telephone Encounter (Signed)
Patient supposed to follow up in 1 month with PFT by 05/06/19. Any chance we can get her in for a pft soon? Not urgent just in last OV of JE

## 2019-05-08 ENCOUNTER — Encounter: Payer: Self-pay | Admitting: Nurse Practitioner

## 2019-05-08 ENCOUNTER — Ambulatory Visit (INDEPENDENT_AMBULATORY_CARE_PROVIDER_SITE_OTHER): Payer: Medicare Other | Admitting: Nurse Practitioner

## 2019-05-08 ENCOUNTER — Other Ambulatory Visit: Payer: Self-pay

## 2019-05-08 VITALS — BP 141/86 | HR 99 | Temp 96.9°F | Ht 64.0 in | Wt 216.4 lb

## 2019-05-08 DIAGNOSIS — K219 Gastro-esophageal reflux disease without esophagitis: Secondary | ICD-10-CM | POA: Diagnosis not present

## 2019-05-08 MED ORDER — PANTOPRAZOLE SODIUM 40 MG PO TBEC: 40.0000 mg | DELAYED_RELEASE_TABLET | Freq: Two times a day (BID) | ORAL | 0 refills | Status: DC

## 2019-05-08 NOTE — Progress Notes (Signed)
Primary Care Physician:  Fayrene Helper, MD Primary Gastroenterologist:  Dr. Oneida Alar  Chief Complaint  Patient presents with  . Gastroesophageal Reflux    cough at night for a while. does not happen every night. Saw PCP and gave nasal sprays-helped some, saw pulmonary and prescribed albuterol.     HPI:   Alexis Hurst is a 68 y.o. female who presents on referral from primary care.  Reviewed information associated with the referral including office visit dated 02/05/2019 with primary care.  At that time she was complaining of a chronic disabling cough and dysphagia.  Her cough was felt to be likely multifactorial however not infectious.  Recommended stopping ARB, maximize treatment of GERD and allergies and refer to pulmonary/allergy for assistance.  Add symptomatic meds such as Phenergan DM and Tessalon Perles.  GERD with chronic cough and dysphagia recommended start Protonix and referred to GI.  Sinus x-ray was completed 02/06/2019 which was completely normal.  Initial referral to our office she was a no-show.  The patient was seen by pulmonary on 04/03/2019 for chronic cough and abnormal CT of the chest.  At that time indicating cough since December without precipitating illness.  Prednisone did not help.  Unable to sing due to lack of stamina with her chronic cough and associated shortness of breath and wheezing.  Worse when laying down at night.  States "my lungs feel dirty."  Low-dose prednisone for 1 week and nasal sprays did help somewhat.  Tried pantoprazole but not sure if this helped.  Cough not improved by 90% compared to when it started.  CT of the chest in May 2020 which found minimal bandlike scarring in the lingula consistent with previous findings, minimal paraseptal emphysema, occasional tiny parenchymal pulmonary nodules and recommended consider follow-up in 12 months with CT.  Overall pulmonary felt cough was improved but recommended PFTs to rule out underlying lung  disease.  Considered low risk for lung cancer and recommended against further imaging.  Prescribed albuterol inhaler as needed.  PFTs were scheduled for the end of August but these have not been completed.  The patient was last seen in our office 04/30/2013 for family history of colon cancer.  At that time she was here to schedule a colonoscopy.  Previous colonoscopy in 2004 by Dr. Tamala Julian which showed diverticula.  Maternal grandfather with colon cancer at an advanced age.  Denied rectal bleeding.  Occasional inner rectal itching.  Recommended colonoscopy.  Colonoscopy completed 05/20/2013 which found moderate diverticulosis in the descending colon, sigmoid colon.  Moderate diverticulosis in the transverse colon and ascending colon.  Small internal hemorrhoids.  Recommended continue weight loss efforts, high-fiber/low-fat diet, repeat colonoscopy in 10 years.  Today she states she's doing better. Cough is persistent but significantly improved from previous. She feels her cough is more URI (throat) rather than deep down in her lung or "bell cough." She is on Protonix daily. She feels this is controlling her reflux well. Never had "bad reflux" and was just started on Protonix for the cough. She notes she could tell a difference when she started Protonix, although she started several things at the same time. She admits she hasn't used the inhaler much. She is waiting on a call from pulmonary for PFTs. Denies abdominal pain, N/V, hematochezia, melena, fever, chills, unintentional weight loss. Denies URI or flu-like symptoms. Denies loss of sense of taste or smell. Denies chest pain, dyspnea, dizziness, lightheadedness, syncope, near syncope. Denies any other upper or lower GI  symptoms.  Past Medical History:  Diagnosis Date  . Anxiety   . Depression   . Dizziness    Mild Orthostatic  . Essential hypertension   . Fatigue   . History of chest pain    Negative stress echo 2010  . Hyperlipidemia   .  Insomnia   . Mitral valve prolapse   . Obesity   . Paresthesias    Upper extremity /episode sensation of coldness and numbness   . Raynauds phenomenon    Possible  . Type 2 diabetes mellitus ([place])     Past Surgical History:  Procedure Laterality Date  . ABDOMINAL HYSTERECTOMY    . BREAST SURGERY    . COLONOSCOPY  06/11/2003   Smith:multiple medium scatered diverticula in the cecum, a sending colon, transverse colon, descending colon, sigmoid colon.  . COLONOSCOPY N/A 05/20/2013   TICS, SML IH  . Cystectomy L breast, benign    . VESICOVAGINAL FISTULA CLOSURE W/ TAH  1989    Current Outpatient Medications  Medication Sig Dispense Refill  . albuterol (VENTOLIN HFA) 108 (90 Base) MCG/ACT inhaler Inhale 2 puffs into the lungs every 6 (six) hours as needed for wheezing or shortness of breath. 8 g 6  . amLODipine (NORVASC) 5 MG tablet TAKE 1 TABLET(5 MG) BY MOUTH DAILY 90 tablet 1  . aspirin (ASPIRIN LOW DOSE) 81 MG EC tablet Take 81 mg by mouth daily.      Marland Kitchen atorvastatin (LIPITOR) 10 MG tablet Take 1 tablet (10 mg total) by mouth daily. 90 tablet 2  . azelastine (ASTELIN) 0.1 % nasal spray Place 2 sprays into both nostrils 2 (two) times daily. Use in each nostril as directed 30 mL 12  . Calcium Carbonate-Vitamin D (CALCIUM + D) 600-200 MG-UNIT TABS Take 1 tablet by mouth daily.     . carvedilol (COREG) 6.25 MG tablet TAKE 1 TABLET BY MOUTH TWICE DAILY WITH A MEAL 180 tablet 1  . chlorpheniramine (CHLOR-TRIMETON) 4 MG tablet Take 1 tablet (4 mg total) by mouth 2 (two) times daily as needed for allergies. 14 tablet 0  . cloNIDine (CATAPRES) 0.1 MG tablet TAKE 1 TABLET BY MOUTH AT BEDTIME FOR BLOOD PRESSURE, SLEEP AND HOT FLASHES 30 tablet 5  . cloNIDine (CATAPRES) 0.2 MG tablet Take 1 tablet (0.2 mg total) by mouth daily. 90 tablet 2  . cyclobenzaprine (FLEXERIL) 10 MG tablet Take 1 tablet (10 mg total) by mouth at bedtime. 10 tablet 0  . FLUoxetine (PROZAC) 40 MG capsule Take 1 capsule (40  mg total) by mouth daily. 30 capsule 5  . fluticasone (FLONASE) 50 MCG/ACT nasal spray Place 2 sprays into both nostrils daily. 16 g 6  . glucose blood (ONE TOUCH ULTRA TEST) test strip Use as instructed once daily dx e11.9 50 each 5  . metFORMIN (GLUCOPHAGE) 1000 MG tablet TAKE 1 TABLET BY MOUTH EVERY DAY WITH BREAKFAST 90 tablet 1  . montelukast (SINGULAIR) 10 MG tablet TAKE 1 TABLET(10 MG) BY MOUTH AT BEDTIME 30 tablet 3  . pantoprazole (PROTONIX) 40 MG tablet Take 1 tablet (40 mg total) by mouth daily. 30 tablet 3  . spironolactone (ALDACTONE) 25 MG tablet Take 1 tablet (25 mg total) by mouth daily. 30 tablet 3  . venlafaxine XR (EFFEXOR-XR) 75 MG 24 hr capsule Take 1 capsule (75 mg total) by mouth daily with breakfast. 90 capsule 1   No current facility-administered medications for this visit.     Allergies as of 05/08/2019 - Review Complete 05/08/2019  Allergen Reaction Noted  . Olmesartan Cough 02/05/2019  . Lactose intolerance (gi) Diarrhea and Nausea And Vomiting 05/07/2013    Family History  Problem Relation Age of Onset  . Hypertension Mother   . Urticaria Mother   . Heart failure Father   . Lung cancer Father   . Heart disease Father   . Asthma Sister   . Heart disease Sister   . Hypertension Brother   . Crohn's disease Brother   . Diabetes Brother   . Hypertension Sister   . Hypertension Sister   . Colon cancer Maternal Grandfather        Age greater than 72  . Allergic rhinitis Neg Hx   . Angioedema Neg Hx   . Atopy Neg Hx   . Eczema Neg Hx   . Immunodeficiency Neg Hx     Social History   Socioeconomic History  . Marital status: Married    Spouse name: Not on file  . Number of children: 2  . Years of education: Not on file  . Highest education level: Not on file  Occupational History  . Occupation: Pharmacist, hospital   Social Needs  . Financial resource strain: Not on file  . Food insecurity    Worry: Not on file    Inability: Not on file  . Transportation  needs    Medical: Not on file    Non-medical: Not on file  Tobacco Use  . Smoking status: Former Smoker    Packs/day: 0.50    Years: 16.00    Pack years: 8.00    Types: Cigarettes    Start date: 23    Quit date: 09/04/1986    Years since quitting: 32.6  . Smokeless tobacco: Never Used  Substance and Sexual Activity  . Alcohol use: No    Alcohol/week: 0.0 standard drinks  . Drug use: No  . Sexual activity: Not on file  Lifestyle  . Physical activity    Days per week: Not on file    Minutes per session: Not on file  . Stress: Not on file  Relationships  . Social Herbalist on phone: Not on file    Gets together: Not on file    Attends religious service: Not on file    Active member of club or organization: Not on file    Attends meetings of clubs or organizations: Not on file    Relationship status: Not on file  . Intimate partner violence    Fear of current or ex partner: Not on file    Emotionally abused: Not on file    Physically abused: Not on file    Forced sexual activity: Not on file  Other Topics Concern  . Not on file  Social History Narrative  . Not on file    Review of Systems: Complete ROS negative except as per HPI.    Physical Exam: BP (!) 141/86   Pulse 99   Temp (!) 96.9 F (36.1 C) (Oral)   Ht 5\' 4"  (1.626 m)   Wt 216 lb 6.4 oz (98.2 kg)   BMI 37.14 kg/m  General:   Alert and oriented. Pleasant and cooperative. Well-nourished and well-developed.  Head:  Normocephalic and atraumatic. Eyes:  Without icterus, sclera clear and conjunctiva pink.  Ears:  Normal auditory acuity. Cardiovascular:  S1, S2 present without murmurs appreciated. Extremities without clubbing or edema. Respiratory:  Clear to auscultation bilaterally. No wheezes, rales, or rhonchi. No distress.  Gastrointestinal:  +BS,  soft, non-tender and non-distended. No HSM noted. No guarding or rebound. No masses appreciated.  Rectal:  Deferred  Musculoskalatal:   Symmetrical without gross deformities. Neurologic:  Alert and oriented x4;  grossly normal neurologically. Psych:  Alert and cooperative. Normal mood and affect. Heme/Lymph/Immune: No excessive bruising noted.    05/08/2019 3:01 PM   Disclaimer: This note was dictated with voice recognition software. Similar sounding words can inadvertently be transcribed and may not be corrected upon review.

## 2019-05-08 NOTE — Assessment & Plan Note (Signed)
The patient has a chronic cough which is felt to be multifactorial in nature.  Theorized GERD as a possible component.  She was started on Protonix.  She states she is 90% better compared to what she was prior, although she does still have a persistent cough.  However, she notes she was started on several medications at the same time so it is difficult to discern which one is likely resulting in improvement.  I reassured her related to CT imaging.  After discussion we decided we would increase her Protonix to twice daily and see if this makes a significant difference in her residual symptoms.  I will send in a 30-day supply for the twice daily dosing.  She will call us in 3 to 4 weeks with a progress report.  If no difference we can reduce back to once a day.  Overall I feel that pulmonary is likely the major component here.

## 2019-05-08 NOTE — Patient Instructions (Signed)
Your health issues we discussed today were:   Ongoing chronic cough, possibly made worse by GERD (reflux/heartburn): 1. Because you are not sure if the acid blocker made a difference I will send in a prescription for Protonix 40 mg twice a day.  Take this 30 minutes before your first meal the day and 30 minutes for your last meal today 2. Call us in 3 to 4 weeks and let us know if this makes a difference.   3. If it does then we can keep a twice daily dosing.  If it does not then your residual cough symptoms are likely not related to GERD 4. Call us if you have any worsening or severe symptoms  Overall I recommend:  1. Continue your other current medications 2. Return for follow-up in 4 months 3. Call us if you have any questions or concerns 4. Call pulmonary if you do not hear from them within the next week in order to schedule your lung function tests   Because of recent events of COVID-19 ("Coronavirus"), follow CDC recommendations:  1. Wash your hand frequently 2. Avoid touching your face 3. Stay away from people who are sick 4. If you have symptoms such as fever, cough, shortness of breath then call your healthcare provider for further guidance 5. If you are sick, STAY AT HOME unless otherwise directed by your healthcare provider. 6. Follow directions from state and national officials regarding staying safe   At [facility] we value your feedback. You may receive a survey about your visit today. Please share your experience as we strive to create trusting relationships with our patients to provide genuine, compassionate, quality care.  We appreciate your understanding and patience as we review any laboratory studies, imaging, and other diagnostic tests that are ordered as we care for you. Our office policy is 5 business days for review of these results, and any emergent or urgent results are addressed in a timely manner for your best interest. If you do not hear from  our office in 1 week, please contact us.   We also encourage the use of MyChart, which contains your medical information for your review as well. If you are not enrolled in this feature, an access code is on this after visit summary for your convenience. Thank you for allowing [place] to be involved in your care.  It was great to see you today!  I hope you have a great summer!!

## 2019-05-09 ENCOUNTER — Encounter: Payer: Self-pay | Admitting: Gastroenterology

## 2019-05-13 NOTE — Telephone Encounter (Signed)
Patient is returning phone call for PFT.  Patient phone number is [phone] h or [phone] c.

## 2019-05-13 NOTE — Telephone Encounter (Signed)
Scheduled pft 06/02/2019-pr

## 2019-05-22 ENCOUNTER — Other Ambulatory Visit: Payer: Self-pay | Admitting: Pulmonary Disease

## 2019-05-26 ENCOUNTER — Ambulatory Visit (INDEPENDENT_AMBULATORY_CARE_PROVIDER_SITE_OTHER): Payer: Medicare Other | Admitting: Family Medicine

## 2019-05-26 ENCOUNTER — Encounter: Payer: Self-pay | Admitting: Family Medicine

## 2019-05-26 ENCOUNTER — Other Ambulatory Visit: Payer: Self-pay

## 2019-05-26 VITALS — BP 124/80 | HR 98 | Temp 98.0°F | Resp 15 | Ht 64.0 in | Wt 221.0 lb

## 2019-05-26 DIAGNOSIS — Z1231 Encounter for screening mammogram for malignant neoplasm of breast: Secondary | ICD-10-CM | POA: Diagnosis not present

## 2019-05-26 DIAGNOSIS — E559 Vitamin D deficiency, unspecified: Secondary | ICD-10-CM

## 2019-05-26 DIAGNOSIS — Z0000 Encounter for general adult medical examination without abnormal findings: Secondary | ICD-10-CM

## 2019-05-26 DIAGNOSIS — Z780 Asymptomatic menopausal state: Secondary | ICD-10-CM

## 2019-05-26 MED ORDER — ORLISTAT 120 MG PO CAPS: 120.0000 mg | ORAL_CAPSULE | Freq: Three times a day (TID) | ORAL | 5 refills | Status: DC

## 2019-05-26 NOTE — Patient Instructions (Addendum)
F/u with MD in 4 months, call if you need me before  Pls schedule mammogram and dexa, 10 when due , afternoon appt preferred, on same day if possible  Xenical is prescribed to help with weight loss by better food choice (less fat)  Please stops sweet drinks  Increase vegetables,fruit , beans, and white meats baked  Try to start 15 mins every day duering school time of physical activity for mental health and weight loss and heart health  Fasting labs as ordered in August , 2020, with additional vit D and TSH added  Cologuard test from 08/2018 to be verified and let pt know result and enter in chart ( if no  Message in 1 week pls let us know)

## 2019-05-28 ENCOUNTER — Encounter: Payer: Self-pay | Admitting: Family Medicine

## 2019-05-28 NOTE — Assessment & Plan Note (Signed)
  Patient re-educated about  the importance of commitment to a  minimum of 150 minutes of exercise per week as able.  The importance of healthy food choices with portion control discussed, as well as eating regularly and within a 12 hour window most days. The need to choose "clean , green" food 50 to 75% of the time is discussed, as well as to make water the primary drink and set a goal of 64 ounces water daily.    Weight /BMI 05/26/2019 05/08/2019 04/14/2019  WEIGHT 221 lb 216 lb 6.4 oz 217 lb  HEIGHT 5\' 4"  5\' 4"  5\' 4"   BMI 37.93 kg/m2 37.14 kg/m2 37.25 kg/m2  Obesity associated with hypertension and depression Start xenical

## 2019-05-28 NOTE — Progress Notes (Signed)
    Alexis Hurst     MRN: [id number]      DOB: Jan 31, 1951  HPI: Patient is in for annual physical exam. No other health concerns are expressed or addressed at the visit. ded.   PE: BP 124/80   Pulse 98   Temp 98 F (36.7 C) (Temporal)   Resp 15   Ht 5\' 4"  (1.626 m)   Wt 221 lb (100.2 kg)   SpO2 98%   BMI 37.93 kg/m  Pleasant  female, alert and oriented x 3, in no cardio-pulmonary distress. Afebrile. HEENT No facial trauma or asymetry. Sinuses non tender.  Extra occullar muscles intact, External ears normal,  Neck: supple, no adenopathy,JVD or thyromegaly.No bruits.  Chest: Clear to ascultation bilaterally.No crackles or wheezes. Non tender to palpation  Breast: Not examined  Cardiovascular system; Heart sounds normal,  S1 and  S2 ,no S3.  No murmur, or thrill. Apical beat not displaced Peripheral pulses normal.  Abdomen: Soft, non tender,  No guarding, tenderness or rebound.     Musculoskeletal exam: Full ROM of spine, hips , shoulders and knees. No deformity ,swelling or crepitus noted. No muscle wasting or atrophy.   Neurologic: Cranial nerves 2 to 12 intact. Power, tone ,sensation and reflexes normal throughout. No disturbance in gait. No tremor.  Skin: Intact, no ulceration, erythema , scaling or rash noted. Pigmentation normal throughout  Psych; Normal mood and affect. Judgement and concentration normal   Assessment & Plan:  Annual physical exam Annual exam as documented. Counseling done  re healthy lifestyle involving commitment to 150 minutes exercise per week, heart healthy diet, and attaining healthy weight.The importance of adequate sleep also discussed. Regular seat belt use and home safety, is also discussed. Changes in health habits are decided on by the patient with goals and time frames  set for achieving them. Immunization and cancer screening needs are specifically addressed at this visit.   Morbid obesity ([place])  Patient  re-educated about  the importance of commitment to a  minimum of 150 minutes of exercise per week as able.  The importance of healthy food choices with portion control discussed, as well as eating regularly and within a 12 hour window most days. The need to choose "clean , green" food 50 to 75% of the time is discussed, as well as to make water the primary drink and set a goal of 64 ounces water daily.    Weight /BMI 05/26/2019 05/08/2019 04/14/2019  WEIGHT 221 lb 216 lb 6.4 oz 217 lb  HEIGHT 5\' 4"  5\' 4"  5\' 4"   BMI 37.93 kg/m2 37.14 kg/m2 37.25 kg/m2  Obesity associated with hypertension and depression Start xenical

## 2019-05-28 NOTE — Assessment & Plan Note (Signed)

## 2019-05-30 ENCOUNTER — Other Ambulatory Visit (HOSPITAL_COMMUNITY)
Admission: RE | Admit: 2019-05-30 | Discharge: 2019-05-30 | Disposition: A | Discharge status: Home / Self Care | Payer: Medicare Other | Source: Ambulatory Visit | Attending: Pulmonary Disease | Admitting: Pulmonary Disease

## 2019-05-30 DIAGNOSIS — Z20828 Contact with and (suspected) exposure to other viral communicable diseases: Secondary | ICD-10-CM | POA: Insufficient documentation

## 2019-05-30 DIAGNOSIS — Z01812 Encounter for preprocedural laboratory examination: Secondary | ICD-10-CM | POA: Diagnosis not present

## 2019-05-30 LAB — SARS CORONAVIRUS 2 (TAT 6-24 HRS): SARS Coronavirus 2: NEGATIVE

## 2019-06-02 ENCOUNTER — Other Ambulatory Visit: Payer: Self-pay | Admitting: Family Medicine

## 2019-06-02 ENCOUNTER — Encounter: Payer: Self-pay | Admitting: Pulmonary Disease

## 2019-06-02 ENCOUNTER — Ambulatory Visit: Payer: Medicare Other | Admitting: Pulmonary Disease

## 2019-06-02 ENCOUNTER — Ambulatory Visit (INDEPENDENT_AMBULATORY_CARE_PROVIDER_SITE_OTHER): Payer: Medicare Other | Admitting: Pulmonary Disease

## 2019-06-02 ENCOUNTER — Other Ambulatory Visit: Payer: Self-pay

## 2019-06-02 VITALS — BP 126/72 | HR 109 | Temp 97.7°F | Ht 64.0 in | Wt 215.0 lb

## 2019-06-02 DIAGNOSIS — R05 Cough: Secondary | ICD-10-CM | POA: Diagnosis not present

## 2019-06-02 DIAGNOSIS — J42 Unspecified chronic bronchitis: Secondary | ICD-10-CM | POA: Insufficient documentation

## 2019-06-02 DIAGNOSIS — R053 Chronic cough: Secondary | ICD-10-CM

## 2019-06-02 LAB — PULMONARY FUNCTION TEST
DL/VA % pred: 107 %
DL/VA: 4.47 ml/min/mmHg/L
DLCO unc % pred: 106 %
DLCO unc: 21.04 ml/min/mmHg
FEF 25-75 Post: 2.72 L/sec
FEF 25-75 Pre: 2.79 L/sec
FEF2575-%Change-Post: -2 %
FEF2575-%Pred-Post: 147 %
FEF2575-%Pred-Pre: 151 %
FEV1-%Change-Post: 2 %
FEV1-%Pred-Post: 127 %
FEV1-%Pred-Pre: 124 %
FEV1-Post: 2.47 L
FEV1-Pre: 2.41 L
FEV1FVC-%Change-Post: 5 %
FEV1FVC-%Pred-Pre: 105 %
FEV6-%Change-Post: -1 %
FEV6-%Pred-Post: 117 %
FEV6-%Pred-Pre: 119 %
FEV6-Post: 2.83 L
FEV6-Pre: 2.87 L
FEV6FVC-%Change-Post: 0 %
FEV6FVC-%Pred-Post: 103 %
FEV6FVC-%Pred-Pre: 103 %
FVC-%Change-Post: -2 %
FVC-%Pred-Post: 114 %
FVC-%Pred-Pre: 116 %
FVC-Post: 2.85 L
FVC-Pre: 2.91 L
Post FEV1/FVC ratio: 87 %
Post FEV6/FVC ratio: 100 %
Pre FEV1/FVC ratio: 83 %
Pre FEV6/FVC Ratio: 99 %
RV % pred: 101 %
RV: 2.15 L
TLC % pred: 104 %
TLC: 5.3 L

## 2019-06-02 MED ORDER — SPIRIVA RESPIMAT 1.25 MCG/ACT IN AERS: 2.0000 | INHALATION_SPRAY | Freq: Every day | RESPIRATORY_TRACT | 0 refills | Status: DC

## 2019-06-02 NOTE — Progress Notes (Signed)
Patient seen in the office today and instructed on use of spiriva respimat .  Patient expressed understanding and demonstrated technique. 

## 2019-06-02 NOTE — Progress Notes (Signed)
Full PFT performed today. °

## 2019-06-02 NOTE — Patient Instructions (Signed)
Chronic Bronchitis START Spiriva 1.25 two puffs once a day CONTINUE Albuterol as needed  F/u virtual video visit in 3 months with me

## 2019-06-02 NOTE — Progress Notes (Signed)
Subjective:   PATIENT ID: Alexis Hurst GENDER: female DOB: 02-Sep-1951, MRN: [id number]   HPI  Chief Complaint  Patient presents with  . Follow-up    PFT    Reason for Visit: Follow-up for cough and PFT evaluation  Ms. Mariajose Neitz is a 68 year old female with HTN, GERD who presents for follow-up.  On our last visit, she reports significant improved cough however she reports redevelopment of productive cough this weekend. With the change in weather, she has had worsening productive cough with clear sputum. Denies fevers or chills. Now has associated sore throat from coughing. Tried albuterol which she felt cleared her up but limited her use due to palpitations and dizziness. Her cough last year started similarly to this around December. Denies fevers, chills, shortness of breath or chest pain.  Social History: Smoked as a teenager. Quit in 1980s. 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  Past Medical History:  Diagnosis Date  . Anxiety   . Depression   . Dizziness    Mild Orthostatic  . Essential hypertension   . Fatigue   . History of chest pain    Negative stress echo 2010  . Hyperlipidemia   . Insomnia   . Mitral valve prolapse   . Obesity   . Paresthesias    Upper extremity /episode sensation of coldness and numbness   . Raynauds phenomenon    Possible  . Type 2 diabetes mellitus (HCC)      Family History  Problem Relation Age of Onset  . Hypertension Mother   . Urticaria Mother   . Heart failure Father   . Lung cancer Father   . Heart disease Father   . Asthma Sister   . Heart disease Sister   . Hypertension Brother   . Crohn's disease Brother   . Diabetes Brother   . Hypertension Sister   . Hypertension Sister   . Colon cancer Maternal Grandfather        Age greater than 56  . Allergic rhinitis Neg Hx   . Angioedema Neg Hx   . Atopy Neg Hx    . Eczema Neg Hx   . Immunodeficiency Neg Hx      Social History   Occupational History  . Occupation: Pharmacist, hospital   Tobacco Use  . Smoking status: Former Smoker    Packs/day: 0.50    Years: 16.00    Pack years: 8.00    Types: Cigarettes    Start date: 32    Quit date: 09/04/1986    Years since quitting: 32.7  . Smokeless tobacco: Never Used  Substance and Sexual Activity  . Alcohol use: No    Alcohol/week: 0.0 standard drinks  . Drug use: No  . Sexual activity: Not on file    Allergies  Allergen Reactions  . Olmesartan Cough  . Lactose Intolerance (Gi) Diarrhea and Nausea And Vomiting     Outpatient Medications Prior to Visit  Medication Sig Dispense Refill  . albuterol (VENTOLIN HFA) 108 (90 Base) MCG/ACT inhaler Inhale 2 puffs into the lungs every 6 (six) hours as needed for wheezing or shortness of breath. 8 g 6  . amLODipine (NORVASC) 5 MG tablet TAKE 1 TABLET(5 MG) BY MOUTH DAILY 90 tablet 1  . aspirin (ASPIRIN LOW DOSE) 81 MG EC tablet Take 81 mg by mouth daily.      Marland Kitchen atorvastatin (LIPITOR) 10 MG tablet Take 1 tablet (  10 mg total) by mouth daily. 90 tablet 2  . azelastine (ASTELIN) 0.1 % nasal spray Place 2 sprays into both nostrils 2 (two) times daily. Use in each nostril as directed 30 mL 12  . Calcium Carbonate-Vitamin D (CALCIUM + D) 600-200 MG-UNIT TABS Take 1 tablet by mouth daily.     . carvedilol (COREG) 6.25 MG tablet TAKE 1 TABLET BY MOUTH TWICE DAILY WITH A MEAL 180 tablet 1  . chlorpheniramine (CHLOR-TRIMETON) 4 MG tablet Take 1 tablet (4 mg total) by mouth 2 (two) times daily as needed for allergies. 14 tablet 0  . cloNIDine (CATAPRES) 0.2 MG tablet Take 1 tablet (0.2 mg total) by mouth daily. 90 tablet 2  . cyclobenzaprine (FLEXERIL) 10 MG tablet Take 1 tablet (10 mg total) by mouth at bedtime. 10 tablet 0  . FLUoxetine (PROZAC) 40 MG capsule Take 1 capsule (40 mg total) by mouth daily. 30 capsule 5  . fluticasone (FLONASE) 50 MCG/ACT nasal spray Place  2 sprays into both nostrils daily. 16 g 6  . glucose blood (ONE TOUCH ULTRA TEST) test strip Use as instructed once daily dx e11.9 50 each 5  . metFORMIN (GLUCOPHAGE) 1000 MG tablet TAKE 1 TABLET BY MOUTH EVERY DAY WITH BREAKFAST 90 tablet 1  . montelukast (SINGULAIR) 10 MG tablet TAKE 1 TABLET(10 MG) BY MOUTH AT BEDTIME 30 tablet 3  . orlistat (XENICAL) 120 MG capsule Take 1 capsule (120 mg total) by mouth 3 (three) times daily with meals. 90 capsule 5  . pantoprazole (PROTONIX) 40 MG tablet Take 1 tablet (40 mg total) by mouth 2 (two) times daily before a meal. 60 tablet 0  . spironolactone (ALDACTONE) 25 MG tablet TAKE 1 TABLET(25 MG) BY MOUTH DAILY 30 tablet 3  . venlafaxine XR (EFFEXOR-XR) 75 MG 24 hr capsule Take 1 capsule (75 mg total) by mouth daily with breakfast. 90 capsule 1   No facility-administered medications prior to visit.     Review of Systems  Constitutional: Positive for malaise/fatigue. Negative for chills and fever.  HENT: Positive for congestion.   Eyes: Negative for blurred vision.  Respiratory: Positive for cough and sputum production. Negative for hemoptysis, shortness of breath and wheezing.   Cardiovascular: Positive for palpitations. Negative for chest pain and leg swelling.  Gastrointestinal: Negative for heartburn and nausea.  Endo/Heme/Allergies: Negative for environmental allergies.     Objective:   Vitals:   06/02/19 1200 06/02/19 1201  BP:  126/72  Pulse:  (!) 109  Temp: 97.7 F (36.5 C)   TempSrc: Temporal   SpO2:  96%  Weight: 215 lb (97.5 kg)   Height: 5\' 4"  (1.626 m)    SpO2: 96 % O2 Device: None (Room air)  Physical Exam: General: Well-appearing, no acute distress HENT: [place], AT, OP clear, MMM Eyes: EOMI, no scleral icterus Respiratory: Clear to auscultation bilaterally.  No crackles, wheezing or rales Cardiovascular: RRR, -M/R/G, no JVD GI: BS+, soft, nontender Extremities:-Edema,-tenderness Neuro: AAO x4, CNII-XII grossly intact  Skin: Intact, no rashes or bruising Psych: Normal mood, normal affect  Data Reviewed:  Imaging: CT Chest 01/23/19 - Minimal scarring in lingula, minimal parseptal emphysema, tiny pulmonary nodules <24mm  PFT: 06/02/19 FVC 2.85 (114%) FEV1 2.47 (127%) Ratio 83  TLC 104% DLCO 106% Interpretation: No obstructive or restrictive defect is present  Labs: 11/12/18 CMP. Overall normal. CO2 30  Imaging, labs and test noted above have been reviewed independently by me.    Assessment & Plan:   Discussion: 68  year old female with hx of HTN, GERD who presents with acute productive cough. We reviewed PFTs which was normal. However this does not preclude benefit of bronchodilator therapy. Since she was not able to tolerate SABA, will trial LAMA. I suspect her need for bronchodilator therapy will only be supportive in her symptoms.  Chronic Bronchitis START Spiriva 1.25 two puffs once a day CONTINUE Albuterol as needed  Tiny pulmonary nodules Low risk for malignancy. Would not recommend further imaging.  Health Maintenance Immunization History  Administered Date(s) Administered  . Fluad Quad(high Dose 65+) 04/28/2019  . Influenza Split 05/23/2011, 05/16/2012  . Influenza Whole 06/21/2008, 06/08/2009, 05/13/2010  . Influenza, High Dose Seasonal PF 06/17/2018  . Influenza,inj,Quad PF,6+ Mos 06/04/2013, 07/21/2014, 07/20/2015, 05/23/2016, 08/06/2017  . Pneumococcal Conjugate-13 04/20/2014  . Pneumococcal Polysaccharide-23 06/17/2018  . Td 03/26/2007  . Tdap 08/06/2017  . Zoster 08/05/2013    No orders of the defined types were placed in this encounter.  Meds ordered this encounter  Medications  . Tiotropium Bromide Monohydrate (SPIRIVA RESPIMAT) 1.25 MCG/ACT AERS    Sig: Inhale 2 puffs into the lungs daily.    Dispense:  4 g    Refill:  0    Order Specific Question:   Lot Number?    Answer:   [id number]    Order Specific Question:   Expiration Date?    Answer:   12/03/2020    Order  Specific Question:   Quantity    Answer:   2   Greater than 50% of this patient 25-minute office visit was spent face-to-face in counseling with the patient/family. We discussed medical diagnosis and treatment plan as noted.  Return in about 3 months (around 09/01/2019).  [place], MD [facility] 06/02/2019 9:09 PM  Office Number [phone]

## 2019-06-03 ENCOUNTER — Other Ambulatory Visit: Payer: Self-pay | Admitting: Family Medicine

## 2019-06-03 DIAGNOSIS — K219 Gastro-esophageal reflux disease without esophagitis: Secondary | ICD-10-CM

## 2019-06-03 LAB — TSH: TSH: 1.19 mIU/L (ref 0.40–4.50)

## 2019-06-03 LAB — VITAMIN D 25 HYDROXY (VIT D DEFICIENCY, FRACTURES): Vit D, 25-Hydroxy: 18 ng/mL — ABNORMAL LOW (ref 30–100)

## 2019-06-03 MED ORDER — ERGOCALCIFEROL 1.25 MG (50000 UT) PO CAPS: 50000.0000 [IU] | ORAL_CAPSULE | ORAL | 1 refills | Status: DC

## 2019-06-03 NOTE — Progress Notes (Signed)
Vit d 

## 2019-06-05 ENCOUNTER — Telehealth: Payer: Self-pay

## 2019-06-05 DIAGNOSIS — I10 Essential (primary) hypertension: Secondary | ICD-10-CM

## 2019-06-05 DIAGNOSIS — R7303 Prediabetes: Secondary | ICD-10-CM

## 2019-06-05 DIAGNOSIS — E785 Hyperlipidemia, unspecified: Secondary | ICD-10-CM

## 2019-06-05 NOTE — Telephone Encounter (Signed)
Labs ordered per MD order

## 2019-06-07 ENCOUNTER — Encounter: Payer: Self-pay | Admitting: Family Medicine

## 2019-06-07 LAB — COMPLETE METABOLIC PANEL WITH GFR
AG Ratio: 1.2 (calc) (ref 1.0–2.5)
ALT: 20 U/L (ref 6–29)
AST: 16 U/L (ref 10–35)
Albumin: 4 g/dL (ref 3.6–5.1)
Alkaline phosphatase (APISO): 113 U/L (ref 37–153)
BUN: 18 mg/dL (ref 7–25)
CO2: 29 mmol/L (ref 20–32)
Calcium: 9.1 mg/dL (ref 8.6–10.4)
Chloride: 104 mmol/L (ref 98–110)
Creat: 0.88 mg/dL (ref 0.50–0.99)
GFR, Est African American: 79 mL/min/{1.73_m2} (ref >=60)
GFR, Est Non African American: 68 mL/min/{1.73_m2} (ref >=60)
Globulin: 3.4 g/dL (calc) (ref 1.9–3.7)
Glucose, Bld: 105 mg/dL — ABNORMAL HIGH (ref 65–99)
Potassium: 4.2 mmol/L (ref 3.5–5.3)
Sodium: 140 mmol/L (ref 135–146)
Total Bilirubin: 0.2 mg/dL (ref 0.2–1.2)
Total Protein: 7.4 g/dL (ref 6.1–8.1)

## 2019-06-07 LAB — CBC
HCT: 37.4 % (ref 35.0–45.0)
Hemoglobin: 12.4 g/dL (ref 11.7–15.5)
MCH: 30 pg (ref 27.0–33.0)
MCHC: 33.2 g/dL (ref 32.0–36.0)
MCV: 90.6 fL (ref 80.0–100.0)
MPV: 10.1 fL (ref 7.5–12.5)
Platelets: 330 10*3/uL (ref 140–400)
RBC: 4.13 10*6/uL (ref 3.80–5.10)
RDW: 13.1 % (ref 11.0–15.0)
WBC: 7.1 10*3/uL (ref 3.8–10.8)

## 2019-06-07 LAB — LIPID PANEL
Cholesterol: 159 mg/dL (ref <200)
HDL: 68 mg/dL (ref >=50)
LDL Cholesterol (Calc): 77 mg/dL (calc)
Non-HDL Cholesterol (Calc): 91 mg/dL (calc) (ref <130)
Total CHOL/HDL Ratio: 2.3 (calc) (ref <5.0)
Triglycerides: 67 mg/dL (ref <150)

## 2019-06-07 LAB — HEMOGLOBIN A1C
Hgb A1c MFr Bld: 6.1 % of total Hgb — ABNORMAL HIGH (ref <5.7)
Mean Plasma Glucose: 128 (calc)
eAG (mmol/L): 7.1 (calc)

## 2019-06-15 ENCOUNTER — Other Ambulatory Visit: Payer: Self-pay | Admitting: Family Medicine

## 2019-06-19 ENCOUNTER — Other Ambulatory Visit: Payer: Self-pay

## 2019-06-19 ENCOUNTER — Encounter: Payer: Self-pay | Admitting: Family Medicine

## 2019-06-19 ENCOUNTER — Ambulatory Visit (INDEPENDENT_AMBULATORY_CARE_PROVIDER_SITE_OTHER): Payer: Medicare Other | Admitting: Family Medicine

## 2019-06-19 VITALS — BP 126/72 | HR 98 | Resp 15 | Ht 64.0 in | Wt 215.0 lb

## 2019-06-19 DIAGNOSIS — Z0000 Encounter for general adult medical examination without abnormal findings: Secondary | ICD-10-CM | POA: Diagnosis not present

## 2019-06-19 NOTE — Patient Instructions (Signed)
Alexis Hurst , Thank you for taking time to come for your Medicare Wellness Visit. I appreciate your ongoing commitment to your health goals. Please review the following plan we discussed and let me know if I can assist you in the future.   Please continue to practice social distancing to keep you, your family, and our community safe.  If you must go out, please wear a Mask and practice good handwashing.  Screening recommendations/referrals: Colonoscopy: Due 2024 Mammogram: Up to date Bone Density: Completed, good bone health from 2012-make sure you get calcium and vitamin D in diet to keep this. Recommended yearly ophthalmology/optometry visit for glaucoma screening and checkup Recommended yearly dental visit for hygiene and checkup  Vaccinations: Influenza vaccine: Completed Pneumococcal vaccine: Completed Tdap vaccine: Due to date Shingles vaccine: Completed  Advanced directives: Please let us know if we can help you with this  Conditions/risks identified: Fall  Next appointment: 09/25/2019  Preventive Care 29 Years and Older, Female Preventive care refers to lifestyle choices and visits with your health care provider that can promote health and wellness. What does preventive care include?  A yearly physical exam. This is also called an annual well check.  Dental exams once or twice a year.  Routine eye exams. Ask your health care provider how often you should have your eyes checked.  Personal lifestyle choices, including:  Daily care of your teeth and gums.  Regular physical activity.  Eating a healthy diet.  Avoiding tobacco and drug use.  Limiting alcohol use.  Practicing safe sex.  Taking low-dose aspirin every day.  Taking vitamin and mineral supplements as recommended by your health care provider. What happens during an annual well check? The services and screenings done by your health care provider during your annual well check will depend on your age,  overall health, lifestyle risk factors, and family history of disease. Counseling  Your health care provider may ask you questions about your:  Alcohol use.  Tobacco use.  Drug use.  Emotional well-being.  Home and relationship well-being.  Sexual activity.  Eating habits.  History of falls.  Memory and ability to understand (cognition).  Work and work Statistician.  Reproductive health. Screening  You may have the following tests or measurements:  Height, weight, and BMI.  Blood pressure.  Lipid and cholesterol levels. These may be checked every 5 years, or more frequently if you are over 83 years old.  Skin check.  Lung cancer screening. You may have this screening every year starting at age 66 if you have a 30-pack-year history of smoking and currently smoke or have quit within the past 15 years.  Fecal occult blood test (FOBT) of the stool. You may have this test every year starting at age 35.  Flexible sigmoidoscopy or colonoscopy. You may have a sigmoidoscopy every 5 years or a colonoscopy every 10 years starting at age 47.  Hepatitis C blood test.  Hepatitis B blood test.  Sexually transmitted disease (STD) testing.  Diabetes screening. This is done by checking your blood sugar (glucose) after you have not eaten for a while (fasting). You may have this done every 1-3 years.  Bone density scan. This is done to screen for osteoporosis. You may have this done starting at age 19.  Mammogram. This may be done every 1-2 years. Talk to your health care provider about how often you should have regular mammograms. Talk with your health care provider about your test results, treatment options, and if necessary, the  need for more tests. Vaccines  Your health care provider may recommend certain vaccines, such as:  Influenza vaccine. This is recommended every year.  Tetanus, diphtheria, and acellular pertussis (Tdap, Td) vaccine. You may need a Td booster every 10  years.  Zoster vaccine. You may need this after age 60.  Pneumococcal 13-valent conjugate (PCV13) vaccine. One dose is recommended after age 61.  Pneumococcal polysaccharide (PPSV23) vaccine. One dose is recommended after age 12. Talk to your health care provider about which screenings and vaccines you need and how often you need them. This information is not intended to replace advice given to you by your health care provider. Make sure you discuss any questions you have with your health care provider. Document Released: 09/17/2015 Document Revised: 05/10/2016 Document Reviewed: 06/22/2015 Elsevier Interactive Patient Education  2017 [place] Prevention in the Home Falls can cause injuries. They can happen to people of all ages. There are many things you can do to make your home safe and to help prevent falls. What can I do on the outside of my home?  Regularly fix the edges of walkways and driveways and fix any cracks.  Remove anything that might make you trip as you walk through a door, such as a raised step or threshold.  Trim any bushes or trees on the path to your home.  Use bright outdoor lighting.  Clear any walking paths of anything that might make someone trip, such as rocks or tools.  Regularly check to see if handrails are loose or broken. Make sure that both sides of any steps have handrails.  Any raised decks and porches should have guardrails on the edges.  Have any leaves, snow, or ice cleared regularly.  Use sand or salt on walking paths during winter.  Clean up any spills in your garage right away. This includes oil or grease spills. What can I do in the bathroom?  Use night lights.  Install grab bars by the toilet and in the tub and shower. Do not use towel bars as grab bars.  Use non-skid mats or decals in the tub or shower.  If you need to sit down in the shower, use a plastic, non-slip stool.  Keep the floor dry. Clean up any water that  spills on the floor as soon as it happens.  Remove soap buildup in the tub or shower regularly.  Attach bath mats securely with double-sided non-slip rug tape.  Do not have throw rugs and other things on the floor that can make you trip. What can I do in the bedroom?  Use night lights.  Make sure that you have a light by your bed that is easy to reach.  Do not use any sheets or blankets that are too big for your bed. They should not hang down onto the floor.  Have a firm chair that has side arms. You can use this for support while you get dressed.  Do not have throw rugs and other things on the floor that can make you trip. What can I do in the kitchen?  Clean up any spills right away.  Avoid walking on wet floors.  Keep items that you use a lot in easy-to-reach places.  If you need to reach something above you, use a strong step stool that has a grab bar.  Keep electrical cords out of the way.  Do not use floor polish or wax that makes floors slippery. If you must use wax,  use non-skid floor wax.  Do not have throw rugs and other things on the floor that can make you trip. What can I do with my stairs?  Do not leave any items on the stairs.  Make sure that there are handrails on both sides of the stairs and use them. Fix handrails that are broken or loose. Make sure that handrails are as long as the stairways.  Check any carpeting to make sure that it is firmly attached to the stairs. Fix any carpet that is loose or worn.  Avoid having throw rugs at the top or bottom of the stairs. If you do have throw rugs, attach them to the floor with carpet tape.  Make sure that you have a light switch at the top of the stairs and the bottom of the stairs. If you do not have them, ask someone to add them for you. What else can I do to help prevent falls?  Wear shoes that:  Do not have high heels.  Have rubber bottoms.  Are comfortable and fit you well.  Are closed at the  toe. Do not wear sandals.  If you use a stepladder:  Make sure that it is fully opened. Do not climb a closed stepladder.  Make sure that both sides of the stepladder are locked into place.  Ask someone to hold it for you, if possible.  Clearly mark and make sure that you can see:  Any grab bars or handrails.  First and last steps.  Where the edge of each step is.  Use tools that help you move around (mobility aids) if they are needed. These include:  Canes.  Walkers.  Scooters.  Crutches.  Turn on the lights when you go into a dark area. Replace any light bulbs as soon as they burn out.  Set up your furniture so you have a clear path. Avoid moving your furniture around.  If any of your floors are uneven, fix them.  If there are any pets around you, be aware of where they are.  Review your medicines with your doctor. Some medicines can make you feel dizzy. This can increase your chance of falling. Ask your doctor what other things that you can do to help prevent falls. This information is not intended to replace advice given to you by your health care provider. Make sure you discuss any questions you have with your health care provider. Document Released: 06/17/2009 Document Revised: 01/27/2016 Document Reviewed: 09/25/2014 Elsevier Interactive Patient Education  2017 [employer].

## 2019-06-19 NOTE — Progress Notes (Signed)
Subjective:   Alexis Hurst is a 68 y.o. female who presents for Medicare Annual (Subsequent) preventive examination.  Location of Patient: Home Location of Provider: Telehealth Consent was obtain for visit to be over via telehealth. I verified that I am speaking with the correct person using two identifiers.   Review of Systems:    Cardiac Risk Factors include: advanced age (>17men, >67 women);dyslipidemia;hypertension;obesity (BMI >30kg/m2)     Objective:     Vitals: BP 126/72   Pulse 98   Resp 15   Ht 5\' 4"  (1.626 m)   Wt 215 lb (97.5 kg)   BMI 36.90 kg/m   Body mass index is 36.9 kg/m.  Advanced Directives 09/03/2015 05/20/2013  Does Patient Have a Medical Advance Directive? No Patient does not have advance directive;Patient would not like information  Would patient like information on creating a medical advance directive? No - patient declined information -  Pre-existing out of facility DNR order (yellow form or pink MOST form) - No    Tobacco Social History   Tobacco Use  Smoking Status Former Smoker  . Packs/day: 0.50  . Years: 16.00  . Pack years: 8.00  . Types: Cigarettes  . Start date: 88  . Quit date: 09/04/1986  . Years since quitting: 32.8  Smokeless Tobacco Never Used     Counseling given: Yes   Clinical Intake:  Pre-visit preparation completed: Yes  Pain : No/denies pain Pain Score: 0-No pain     BMI - recorded: 36.9 Nutritional Status: BMI > 30  Obese Nutritional Risks: None Diabetes: No  How often do you need to have someone help you when you read instructions, pamphlets, or other written materials from your doctor or pharmacy?: 1 - Never What is the last grade level you completed in school?: masters  Interpreter Needed?: No     Past Medical History:  Diagnosis Date  . Anxiety   . Depression   . Dizziness    Mild Orthostatic  . Essential hypertension   . Fatigue   . History of chest pain    Negative stress echo 2010   . Hyperlipidemia   . Insomnia   . Mitral valve prolapse   . Obesity   . Paresthesias    Upper extremity /episode sensation of coldness and numbness   . Raynauds phenomenon    Possible  . Type 2 diabetes mellitus ([place])    Past Surgical History:  Procedure Laterality Date  . ABDOMINAL HYSTERECTOMY    . BREAST SURGERY    . COLONOSCOPY  06/11/2003   Smith:multiple medium scatered diverticula in the cecum, a sending colon, transverse colon, descending colon, sigmoid colon.  . COLONOSCOPY N/A 05/20/2013   TICS, SML IH  . Cystectomy L breast, benign    . VESICOVAGINAL FISTULA CLOSURE W/ TAH  1989   Family History  Problem Relation Age of Onset  . Hypertension Mother   . Urticaria Mother   . Heart failure Father   . Lung cancer Father   . Heart disease Father   . Asthma Sister   . Heart disease Sister   . Hypertension Brother   . Crohn's disease Brother   . Diabetes Brother   . Hypertension Sister   . Hypertension Sister   . Colon cancer Maternal Grandfather        Age greater than 46  . Allergic rhinitis Neg Hx   . Angioedema Neg Hx   . Atopy Neg Hx   . Eczema Neg Hx   .  Immunodeficiency Neg Hx    Social History   Socioeconomic History  . Marital status: Married    Spouse name: Not on file  . Number of children: 2  . Years of education: Not on file  . Highest education level: Not on file  Occupational History  . Occupation: Pharmacist, hospital   Social Needs  . Financial resource strain: Not hard at all  . Food insecurity    Worry: Never true    Inability: Never true  . Transportation needs    Medical: No    Non-medical: No  Tobacco Use  . Smoking status: Former Smoker    Packs/day: 0.50    Years: 16.00    Pack years: 8.00    Types: Cigarettes    Start date: 67    Quit date: 09/04/1986    Years since quitting: 32.8  . Smokeless tobacco: Never Used  Substance and Sexual Activity  . Alcohol use: No    Alcohol/week: 0.0 standard drinks  . Drug use: No  . Sexual  activity: Not on file  Lifestyle  . Physical activity    Days per week: 0 days    Minutes per session: 0 min  . Stress: Only a little  Relationships  . Social connections    Talks on phone: More than three times a week    Gets together: More than three times a week    Attends religious service: More than 4 times per year    Active member of club or organization: Yes    Attends meetings of clubs or organizations: More than 4 times per year    Relationship status: Married  Other Topics Concern  . Not on file  Social History Narrative  . Not on file    Outpatient Encounter Medications as of 06/19/2019  Medication Sig  . albuterol (VENTOLIN HFA) 108 (90 Base) MCG/ACT inhaler Inhale 2 puffs into the lungs every 6 (six) hours as needed for wheezing or shortness of breath.  Marland Kitchen amLODipine (NORVASC) 5 MG tablet TAKE 1 TABLET(5 MG) BY MOUTH DAILY  . aspirin (ASPIRIN LOW DOSE) 81 MG EC tablet Take 81 mg by mouth daily.    Marland Kitchen atorvastatin (LIPITOR) 10 MG tablet Take 1 tablet (10 mg total) by mouth daily.  Marland Kitchen azelastine (ASTELIN) 0.1 % nasal spray Place 2 sprays into both nostrils 2 (two) times daily. Use in each nostril as directed  . Calcium Carbonate-Vitamin D (CALCIUM + D) 600-200 MG-UNIT TABS Take 1 tablet by mouth daily.   . carvedilol (COREG) 6.25 MG tablet TAKE 1 TABLET BY MOUTH TWICE DAILY WITH A MEAL  . chlorpheniramine (CHLOR-TRIMETON) 4 MG tablet Take 1 tablet (4 mg total) by mouth 2 (two) times daily as needed for allergies.  . cloNIDine (CATAPRES) 0.2 MG tablet Take 1 tablet (0.2 mg total) by mouth daily.  . cyclobenzaprine (FLEXERIL) 10 MG tablet Take 1 tablet (10 mg total) by mouth at bedtime.  . ergocalciferol (VITAMIN D2) 1.25 MG (50000 UT) capsule Take 1 capsule (50,000 Units total) by mouth once a week. One capsule once weekly  . FLUoxetine (PROZAC) 40 MG capsule Take 1 capsule (40 mg total) by mouth daily.  . fluticasone (FLONASE) 50 MCG/ACT nasal spray Place 2 sprays into both  nostrils daily.  Marland Kitchen glucose blood (ONE TOUCH ULTRA TEST) test strip Use as instructed once daily dx e11.9  . metFORMIN (GLUCOPHAGE) 1000 MG tablet TAKE 1 TABLET BY MOUTH EVERY DAY WITH BREAKFAST  . montelukast (SINGULAIR) 10 MG tablet  TAKE 1 TABLET(10 MG) BY MOUTH AT BEDTIME  . orlistat (XENICAL) 120 MG capsule Take 1 capsule (120 mg total) by mouth 3 (three) times daily with meals.  . pantoprazole (PROTONIX) 40 MG tablet TAKE 1 TABLET(40 MG) BY MOUTH DAILY  . spironolactone (ALDACTONE) 25 MG tablet TAKE 1 TABLET(25 MG) BY MOUTH DAILY  . Tiotropium Bromide Monohydrate (SPIRIVA RESPIMAT) 1.25 MCG/ACT AERS Inhale 2 puffs into the lungs daily.  Marland Kitchen venlafaxine XR (EFFEXOR-XR) 75 MG 24 hr capsule Take 1 capsule (75 mg total) by mouth daily with breakfast.   No facility-administered encounter medications on file as of 06/19/2019.     Activities of Daily Living In your present state of health, do you have any difficulty performing the following activities: 06/19/2019 04/14/2019  Hearing? N N  Vision? N N  Difficulty concentrating or making decisions? Y N  Comment sometimes -  Walking or climbing stairs? N N  Dressing or bathing? N N  Doing errands, shopping? N N  Preparing Food and eating ? N -  Using the Toilet? N -  In the past six months, have you accidently leaked urine? N -  Do you have problems with loss of bowel control? N -  Managing your Medications? N -  Managing your Finances? N -  Some recent data might be hidden    Patient Care Team: Fayrene Helper, MD as PCP - [facility], Marga Melnick, MD as Consulting Physician (Gastroenterology)    Assessment:   This is a routine wellness examination for Arnita.  Exercise Activities and Dietary recommendations Current Exercise Habits: The patient does not participate in regular exercise at present, Exercise limited by: None identified  Goals   None     Fall Risk Fall Risk  06/19/2019 05/26/2019 04/14/2019 02/05/2019 12/26/2018   Falls in the past year? 0 0 0 0 0  Number falls in past yr: 0 0 0 0 0  Injury with Fall? 0 0 0 0 0  Follow up - - - - -   Is the patient's home free of loose throw rugs in walkways, pet beds, electrical cords, etc?   yes      Grab bars in the bathroom? yes      Handrails on the stairs?   yes      Adequate lighting?   yes     Depression Screen PHQ 2/9 Scores 06/19/2019 05/26/2019 04/14/2019 02/05/2019  PHQ - 2 Score 1 0 0 0  PHQ- 9 Score - - - -     Cognitive Function     6CIT Screen 06/19/2019  What Year? 0 points  What month? 0 points  What time? 0 points  Count back from 20 0 points  Months in reverse 0 points  Repeat phrase 0 points  Total Score 0    Immunization History  Administered Date(s) Administered  . Fluad Quad(high Dose 65+) 04/28/2019  . Influenza Split 05/23/2011, 05/16/2012  . Influenza Whole 06/21/2008, 06/08/2009, 05/13/2010  . Influenza, High Dose Seasonal PF 06/17/2018  . Influenza,inj,Quad PF,6+ Mos 06/04/2013, 07/21/2014, 07/20/2015, 05/23/2016, 08/06/2017  . Pneumococcal Conjugate-13 04/20/2014  . Pneumococcal Polysaccharide-23 06/17/2018  . Td 03/26/2007  . Tdap 08/06/2017  . Zoster 08/05/2013    Qualifies for Shingles Vaccine?  completed  Screening Tests Health Maintenance  Topic Date Due  . MAMMOGRAM  06/26/2020  . COLONOSCOPY  05/21/2023  . TETANUS/TDAP  08/07/2027  . INFLUENZA VACCINE  Completed  . DEXA SCAN  Completed  . Hepatitis C Screening  Completed  . PNA vac Low Risk Adult  Completed    Cancer Screenings: Lung: Low Dose CT Chest recommended if Age 84-80 years, 30 pack-year currently smoking OR have quit w/in 15years. Patient does not qualify. Breast:  Up to date on Mammogram? Yes   Up to date of Bone Density/Dexa? Yes Colorectal:  Due 2024  Additional Screenings:   Hepatitis C Screening:  completed     Plan:      1. Encounter for Medicare annual wellness exam  I have personally reviewed and noted the following in  the patient's chart:   . Medical and social history . Use of alcohol, tobacco or illicit drugs  . Current medications and supplements . Functional ability and status . Nutritional status . Physical activity . Advanced directives . List of other physicians . Hospitalizations, surgeries, and ER visits in previous 12 months . Vitals . Screenings to include cognitive, depression, and falls . Referrals and appointments  In addition, I have reviewed and discussed with patient certain preventive protocols, quality metrics, and best practice recommendations. A written personalized care plan for preventive services as well as general preventive health recommendations were provided to patient.     I provided 20 minutes of non-face-to-face time during this encounter.   Perlie Mayo, NP  06/19/2019

## 2019-06-27 ENCOUNTER — Other Ambulatory Visit: Payer: Self-pay

## 2019-06-27 DIAGNOSIS — Z20822 Contact with and (suspected) exposure to covid-19: Secondary | ICD-10-CM

## 2019-06-29 LAB — NOVEL CORONAVIRUS, NAA: SARS-CoV-2, NAA: NOT DETECTED

## 2019-06-30 ENCOUNTER — Ambulatory Visit (HOSPITAL_COMMUNITY)
Admission: RE | Admit: 2019-06-30 | Discharge: 2019-06-30 | Disposition: A | Discharge status: Home / Self Care | Payer: Medicare Other | Source: Ambulatory Visit | Attending: Family Medicine | Admitting: Family Medicine

## 2019-06-30 ENCOUNTER — Other Ambulatory Visit: Payer: Self-pay

## 2019-06-30 ENCOUNTER — Encounter (HOSPITAL_COMMUNITY): Payer: Self-pay

## 2019-06-30 DIAGNOSIS — Z1231 Encounter for screening mammogram for malignant neoplasm of breast: Secondary | ICD-10-CM | POA: Insufficient documentation

## 2019-06-30 DIAGNOSIS — Z780 Asymptomatic menopausal state: Secondary | ICD-10-CM | POA: Diagnosis not present

## 2019-07-02 ENCOUNTER — Other Ambulatory Visit: Payer: Self-pay | Admitting: Nurse Practitioner

## 2019-07-02 ENCOUNTER — Encounter: Payer: Self-pay | Admitting: Family Medicine

## 2019-07-02 DIAGNOSIS — K219 Gastro-esophageal reflux disease without esophagitis: Secondary | ICD-10-CM

## 2019-08-04 ENCOUNTER — Other Ambulatory Visit: Payer: Self-pay | Admitting: Family Medicine

## 2019-08-11 ENCOUNTER — Other Ambulatory Visit: Payer: Self-pay | Admitting: Family Medicine

## 2019-08-27 ENCOUNTER — Other Ambulatory Visit: Payer: Self-pay

## 2019-08-27 MED ORDER — MONTELUKAST SODIUM 10 MG PO TABS: ORAL_TABLET | ORAL | 5 refills | Status: DC

## 2019-08-31 ENCOUNTER — Other Ambulatory Visit: Payer: Self-pay | Admitting: Family Medicine

## 2019-09-01 ENCOUNTER — Other Ambulatory Visit: Payer: Self-pay

## 2019-09-01 MED ORDER — CARVEDILOL 6.25 MG PO TABS: 6.2500 mg | ORAL_TABLET | Freq: Two times a day (BID) | ORAL | 1 refills | Status: DC

## 2019-09-05 DIAGNOSIS — U071 COVID-19: Secondary | ICD-10-CM

## 2019-09-05 HISTORY — DX: COVID-19: U07.1

## 2019-09-09 ENCOUNTER — Other Ambulatory Visit: Payer: Self-pay

## 2019-09-09 DIAGNOSIS — E785 Hyperlipidemia, unspecified: Secondary | ICD-10-CM

## 2019-09-09 MED ORDER — ATORVASTATIN CALCIUM 10 MG PO TABS: 10.0000 mg | ORAL_TABLET | Freq: Every day | ORAL | 2 refills | Status: DC

## 2019-09-10 ENCOUNTER — Other Ambulatory Visit: Payer: Self-pay | Admitting: Family Medicine

## 2019-09-16 ENCOUNTER — Ambulatory Visit: Payer: Medicare PPO | Attending: Internal Medicine

## 2019-09-16 ENCOUNTER — Other Ambulatory Visit: Payer: Self-pay

## 2019-09-16 ENCOUNTER — Ambulatory Visit (INDEPENDENT_AMBULATORY_CARE_PROVIDER_SITE_OTHER): Payer: Medicare PPO | Admitting: Nurse Practitioner

## 2019-09-16 ENCOUNTER — Encounter: Payer: Self-pay | Admitting: Nurse Practitioner

## 2019-09-16 ENCOUNTER — Other Ambulatory Visit: Payer: Medicare PPO

## 2019-09-16 ENCOUNTER — Encounter: Payer: Self-pay | Admitting: Gastroenterology

## 2019-09-16 DIAGNOSIS — K219 Gastro-esophageal reflux disease without esophagitis: Secondary | ICD-10-CM

## 2019-09-16 DIAGNOSIS — R531 Weakness: Secondary | ICD-10-CM | POA: Diagnosis not present

## 2019-09-16 DIAGNOSIS — Z20822 Contact with and (suspected) exposure to covid-19: Secondary | ICD-10-CM

## 2019-09-16 DIAGNOSIS — R053 Chronic cough: Secondary | ICD-10-CM

## 2019-09-16 DIAGNOSIS — R05 Cough: Secondary | ICD-10-CM

## 2019-09-16 NOTE — Patient Instructions (Signed)
Your health issues we discussed today were:   GERD (reflux/heartburn) with chronic cough: 1. As we discussed, continue taking Protonix twice daily.  Take this first thing in the morning and 30 minutes for your last meal the day 2. Call us if you have any worsening reflux symptoms  Worsening cough with fatigue/weakness and possible COVID exposure: 1. I am sorry you are not feeling well! 2. Hopefully your test turns out well 3. As we discussed, if you have worsening shortness of breath/difficulty breathing you should proceed to the emergency room  Overall I recommend:  1. Continue your other current medications 2. Return for follow-up in 6 months 3. Call us if you have any questions or concerns.   Because of recent events of COVID-19 ("Coronavirus"), follow CDC recommendations:  Wash your hand frequently Avoid touching your face Stay away from people who are sick If you have symptoms such as fever, cough, shortness of breath then call your healthcare provider for further guidance If you are sick, STAY AT HOME unless otherwise directed by your healthcare provider. Follow directions from state and national officials regarding staying safe   At [facility] we value your feedback. You may receive a survey about your visit today. Please share your experience as we strive to create trusting relationships with our patients to provide genuine, compassionate, quality care.  We appreciate your understanding and patience as we review any laboratory studies, imaging, and other diagnostic tests that are ordered as we care for you. Our office policy is 5 business days for review of these results, and any emergent or urgent results are addressed in a timely manner for your best interest. If you do not hear from our office in 1 week, please contact us.   We also encourage the use of MyChart, which contains your medical information for your review as well. If you are not enrolled in this  feature, an access code is on this after visit summary for your convenience. Thank you for allowing [place] to be involved in your care.  It was great to see you today!  I hope you have a Happy New Year!!

## 2019-09-16 NOTE — Progress Notes (Signed)
CC'ED TO PCP 

## 2019-09-16 NOTE — Progress Notes (Signed)
Referring Provider: Fayrene Helper, MD Primary Care Physician:  Fayrene Helper, MD Primary GI:  Dr. Oneida Alar  NOTE: Service was provided via telemedicine and was requested by the patient due to COVID-19 pandemic.  Method of visit: Telephone  Patient Location: Home  Provider Location: Office  Reason for Phone Visit: Follow-up  The patient was consented to phone follow-up via telephone encounter including billing of the encounter (yes/no): Yes  Persons present on the phone encounter, with roles: None  Total time (minutes) spent on medical discussion: 15 minutes  Chief Complaint  Patient presents with   Gastroesophageal Reflux    doing ok    HPI:   Alexis Hurst is a 69 y.o. female who presents for virtual visit regarding: Follow-up on GERD.  Patient was last seen in our office 05/08/2019 for the same.  Previously complaining of chronic cough and dysphagia felt to be multifactorial but not infectious.  At primary care recommended stopping ARB, maximizing GERD treatment, allergy treatment, pulmonary/allergy for assistance.  Previously evaluated by by pulmonary with noted cough since December and no assistance from prednisone.  This affected her singing.  Worse at night.  Prednisone for 1 week and nasal sprays helped somewhat.  Pantoprazole did not improve her symptoms.  CT of the chest with abnormal findings including bandlike scarring in the lingula, minimal paraseptal emphysema, occasional tiny parenchymal pulmonary nodules with recommended 65-month follow-up.  Overall pulmonary felt cough improved, but recommended PFTs.  Colonoscopy up-to-date 2014 with recommended 10-year repeat (2024). At her last visit her cough is persistent but significantly improved and feels more related to upper respiratory rather than down in her lungs or "belly cough".  On Protonix daily which is controlling reflux well.  Has never had significant reflux symptoms just coughing.  She notes that  she could tell a difference when she started Protonix.  She was still awaiting PFT results.  No other overt GI complaints.  Recommended increase Protonix to 40 mg twice a day, progress report in 3 to 4 weeks, if residual symptoms likely cough unrelated to GERD.  Follow-up in 4 months.  No progress report received from patient.  Last saw pulmonary 06/02/2019 with PFT resulted to no obstructive or restrictive defect.  Felt to have chronic bronchitis and tiny pulmonary nodules with low risk for malignancy.  Recommended against further imaging.  Start her on a LAMA due to unable to tolerate SABA.  Prescription given for Spiriva daily and continue albuterol.  Today she states she's doing well overall. Chronic cough is improved but still occasionally "has it's moments." Recently she was recently exposed to [place]. Last COVID test 06/27/2019 was negative. Has an appointment today for COVID testing at [facility]. Having a lot of weakness and fatigue with her symptoms. Denies significant dyspnea. Still on Protonix which she feels has helped. Rare pills dysphagia despite half glass of water; will drink more water if she has pill dysphagia. Denies abdominal pain, N/V, hematochezia, melena, fever, chills, unintentional weight loss. Denies chest pain, dyspnea, dizziness, lightheadedness, syncope, near syncope. Denies any other upper or lower GI symptoms.  Past Medical History:  Diagnosis Date   Anxiety    Depression    Dizziness    Mild Orthostatic   Essential hypertension    Fatigue    History of chest pain    Negative stress echo 2010   Hyperlipidemia    Insomnia    Mitral valve prolapse    Obesity    Paresthesias  Upper extremity /episode sensation of coldness and numbness    Raynauds phenomenon    Possible   Type 2 diabetes mellitus ([place])     Past Surgical History:  Procedure Laterality Date   ABDOMINAL HYSTERECTOMY     BREAST SURGERY     COLONOSCOPY  06/11/2003    Smith:multiple medium scatered diverticula in the cecum, a sending colon, transverse colon, descending colon, sigmoid colon.   COLONOSCOPY N/A 05/20/2013   TICS, SML IH   Cystectomy L breast, benign     VESICOVAGINAL FISTULA CLOSURE W/ TAH  1989    Current Outpatient Medications  Medication Sig Dispense Refill   albuterol (VENTOLIN HFA) 108 (90 Base) MCG/ACT inhaler Inhale 2 puffs into the lungs every 6 (six) hours as needed for wheezing or shortness of breath. 8 g 6   amLODipine (NORVASC) 5 MG tablet TAKE 1 TABLET(5 MG) BY MOUTH DAILY 90 tablet 1   aspirin (ASPIRIN LOW DOSE) 81 MG EC tablet Take 81 mg by mouth daily.       atorvastatin (LIPITOR) 10 MG tablet Take 1 tablet (10 mg total) by mouth daily. 90 tablet 2   azelastine (ASTELIN) 0.1 % nasal spray Place 2 sprays into both nostrils 2 (two) times daily. Use in each nostril as directed 30 mL 12   Calcium Carbonate-Vitamin D (CALCIUM + D) 600-200 MG-UNIT TABS Take 1 tablet by mouth daily.      carvedilol (COREG) 6.25 MG tablet Take 1 tablet (6.25 mg total) by mouth 2 (two) times daily with a meal. 180 tablet 1   chlorpheniramine (CHLOR-TRIMETON) 4 MG tablet Take 1 tablet (4 mg total) by mouth 2 (two) times daily as needed for allergies. 14 tablet 0   cloNIDine (CATAPRES) 0.2 MG tablet Take 1 tablet (0.2 mg total) by mouth daily. 90 tablet 2   ergocalciferol (VITAMIN D2) 1.25 MG (50000 UT) capsule Take 1 capsule (50,000 Units total) by mouth once a week. One capsule once weekly 12 capsule 1   FLUoxetine (PROZAC) 40 MG capsule TAKE 1 CAPSULE(40 MG) BY MOUTH DAILY 30 capsule 5   fluticasone (FLONASE) 50 MCG/ACT nasal spray SHAKE LIQUID AND USE 2 SPRAYS IN EACH NOSTRIL DAILY 16 g 6   glucose blood (ONE TOUCH ULTRA TEST) test strip Use as instructed once daily dx e11.9 50 each 5   metFORMIN (GLUCOPHAGE) 1000 MG tablet TAKE 1 TABLET BY MOUTH EVERY DAY WITH BREAKFAST 90 tablet 1   montelukast (SINGULAIR) 10 MG tablet TAKE 1  TABLET(10 MG) BY MOUTH AT BEDTIME 30 tablet 5   pantoprazole (PROTONIX) 40 MG tablet TAKE 1 TABLET(40 MG) BY MOUTH TWICE DAILY BEFORE A MEAL 60 tablet 5   spironolactone (ALDACTONE) 25 MG tablet TAKE 1 TABLET(25 MG) BY MOUTH DAILY 30 tablet 3   Tiotropium Bromide Monohydrate (SPIRIVA RESPIMAT) 1.25 MCG/ACT AERS Inhale 2 puffs into the lungs daily. 4 g 0   venlafaxine XR (EFFEXOR-XR) 75 MG 24 hr capsule TAKE 1 CAPSULE(75 MG) BY MOUTH DAILY WITH BREAKFAST 90 capsule 1   No current facility-administered medications for this visit.    Allergies as of 09/16/2019 - Review Complete 09/16/2019  Allergen Reaction Noted   Olmesartan Cough 02/05/2019   Lactose intolerance (gi) Diarrhea and Nausea And Vomiting 05/07/2013    Family History  Problem Relation Age of Onset   Hypertension Mother    Urticaria Mother    Heart failure Father    Lung cancer Father    Heart disease Father    Asthma Sister  Heart disease Sister    Hypertension Brother    Crohn's disease Brother    Diabetes Brother    Hypertension Sister    Hypertension Sister    Colon cancer Maternal Grandfather        Age greater than 12   Allergic rhinitis Neg Hx    Angioedema Neg Hx    Atopy Neg Hx    Eczema Neg Hx    Immunodeficiency Neg Hx     Social History   Socioeconomic History   Marital status: Married    Spouse name: Not on file   Number of children: 2   Years of education: Not on file   Highest education level: Not on file  Occupational History   Occupation: Pharmacist, hospital   Tobacco Use   Smoking status: Former Smoker    Packs/day: 0.50    Years: 16.00    Pack years: 8.00    Types: Cigarettes    Start date: 89    Quit date: 09/04/1986    Years since quitting: 33.0   Smokeless tobacco: Never Used  Substance and Sexual Activity   Alcohol use: Yes    Alcohol/week: 0.0 standard drinks    Comment: occ   Drug use: No   Sexual activity: Not on file  Other Topics Concern    Not on file  Social History Narrative   Not on file   Social Determinants of Health   Financial Resource Strain: Low Risk    Difficulty of Paying Living Expenses: Not hard at all  Food Insecurity: No Food Insecurity   Worried About Charity fundraiser in the Last Year: Never true   [place] in the Last Year: Never true  Transportation Needs: No Transportation Needs   Lack of Transportation (Medical): No   Lack of Transportation (Non-Medical): No  Physical Activity: Inactive   Days of Exercise per Week: 0 days   Minutes of Exercise per Session: 0 min  Stress: No Stress Concern Present   Feeling of Stress : Only a little  Social Connections: Not Isolated   Frequency of Communication with Friends and Family: More than three times a week   Frequency of Social Gatherings with Friends and Family: More than three times a week   Attends Religious Services: More than 4 times per year   Active Member of [employer] or Organizations: Yes   Attends Music therapist: More than 4 times per year   Marital Status: Married    Review of Systems: General: Negative for anorexia, weight loss, fever, chills, fatigue, weakness. ENT: Negative for hoarseness. Admits rare pill dysphagia.  CV: Negative for chest pain, angina, palpitations, peripheral edema.  Respiratory: Negative for dyspnea at rest, cough, sputum, wheezing.  GI: See history of present illness. Neuro: Admits weakness. Denies abnormal sensation, seizure, frequent headaches, memory loss, confusion.  Endo: Negative for unusual weight change.  Heme: Negative for bruising or bleeding. Allergy: Negative for rash or hives.  Physical Exam: Note: limited exam due to virtual visit General:   Alert and oriented. Pleasant and cooperative. Ears:  Normal auditory acuity. Neurologic:  Alert and oriented x4 Psych:  Alert and cooperative. Normal mood and affect.

## 2019-09-16 NOTE — Assessment & Plan Note (Signed)
Having worsening cough and thinks she was exposed to Covid.  She has an appointment today to be tested for Covid.  Denies overt dyspnea.  Her primary symptoms are some increased coughing and weakness/fatigue.  Before that her symptoms had improved on PPI.  Recommend continue her current medications and follow-up in 6 months.  I gave her ER precautions related to dyspnea and other concerning symptoms for possible Covid.

## 2019-09-16 NOTE — Assessment & Plan Note (Signed)
GERD currently doing well on Protonix twice daily.  No overt worsening of her reflux.  Some increased coughing but possible exposure to Covid and has an appointment today to be tested.  Continue her current medications and follow-up in 6 months.  ER precautions given.

## 2019-09-17 LAB — NOVEL CORONAVIRUS, NAA: SARS-CoV-2, NAA: DETECTED — AB

## 2019-09-23 ENCOUNTER — Other Ambulatory Visit: Payer: Self-pay

## 2019-09-23 MED ORDER — ALBUTEROL SULFATE HFA 108 (90 BASE) MCG/ACT IN AERS: 2.0000 | INHALATION_SPRAY | Freq: Four times a day (QID) | RESPIRATORY_TRACT | 6 refills | Status: DC | PRN

## 2019-09-24 ENCOUNTER — Ambulatory Visit (INDEPENDENT_AMBULATORY_CARE_PROVIDER_SITE_OTHER): Payer: Medicare PPO | Admitting: Family Medicine

## 2019-09-24 ENCOUNTER — Encounter: Payer: Self-pay | Admitting: Family Medicine

## 2019-09-24 ENCOUNTER — Other Ambulatory Visit: Payer: Self-pay

## 2019-09-24 VITALS — BP 120/80 | HR 84 | Ht 62.0 in | Wt 220.0 lb

## 2019-09-24 DIAGNOSIS — E785 Hyperlipidemia, unspecified: Secondary | ICD-10-CM

## 2019-09-24 DIAGNOSIS — I10 Essential (primary) hypertension: Secondary | ICD-10-CM

## 2019-09-24 DIAGNOSIS — U071 COVID-19: Secondary | ICD-10-CM | POA: Diagnosis not present

## 2019-09-24 DIAGNOSIS — R7301 Impaired fasting glucose: Secondary | ICD-10-CM

## 2019-09-24 HISTORY — DX: COVID-19: U07.1

## 2019-09-24 MED ORDER — FLUOXETINE HCL 40 MG PO CAPS: ORAL_CAPSULE | ORAL | 5 refills | Status: DC

## 2019-09-24 MED ORDER — PREDNISONE 5 MG PO TABS: 5.0000 mg | ORAL_TABLET | Freq: Two times a day (BID) | ORAL | 0 refills | Status: AC

## 2019-09-24 NOTE — Assessment & Plan Note (Signed)
Only loss of tsaste and smell, never fever, improving, 5 day course of prednisone is prescribed

## 2019-09-24 NOTE — Patient Instructions (Addendum)
F/U in office with MD early May, call if you need me sooner  5 day course of prednisone is prescribed  Please get fasting lipid, cmp and EGFr and hBa1C 1 week before next visit  Best wishes for 2021!    10 Things You Can Do to Manage Your COVID-19 Symptoms at Home If you have possible or confirmed COVID-19: 1. Stay home from work and school. And stay away from other public places. If you must go out, avoid using any kind of public transportation, ridesharing, or taxis. 2. Monitor your symptoms carefully. If your symptoms get worse, call your healthcare provider immediately. 3. Get rest and stay hydrated. 4. If you have a medical appointment, call the healthcare provider ahead of time and tell them that you have or may have COVID-19. 5. For medical emergencies, call 911 and notify the dispatch personnel that you have or may have COVID-19. 6. Cover your cough and sneezes with a tissue or use the inside of your elbow. 7. Wash your hands often with soap and water for at least 20 seconds or clean your hands with an alcohol-based hand sanitizer that contains at least 60% alcohol. 8. As much as possible, stay in a specific room and away from other people in your home. Also, you should use a separate bathroom, if available. If you need to be around other people in or outside of the home, wear a mask. 9. Avoid sharing personal items with other people in your household, like dishes, towels, and bedding. 10. Clean all surfaces that are touched often, like counters, tabletops, and doorknobs. Use household cleaning sprays or wipes according to the label instructions. [email or web address] 03/05/2019 This information is not intended to replace advice given to you by your health care provider. Make sure you discuss any questions you have with your health care provider. Document Revised: 08/07/2019 Document Reviewed: 08/07/2019 Elsevier Patient Education  [place].

## 2019-09-24 NOTE — Progress Notes (Signed)
Virtual Visit via Telephone Note  I connected with Alexis Hurst on 09/24/19 at  4:00 PM EST by telephone and verified that I am speaking with the correct person using two identifiers.  Location: Patient: home Provider: office   I discussed the limitations, risks, security and privacy concerns of performing an evaluation and management service by telephone and the availability of in person appointments. I also discussed with the patient that there may be a patient responsible charge related to this service. The patient expressed understanding and agreed to proceed.   History of Present Illness: Diagnosed with covid on 09/16/2019, twelve days ago. Denies fever, chills, cough  C/O loss of taste and smell which persisitsa. Energy level also reduced  Otherwise has been doing well. Spouse also  Infected Denies recent fever or chills. Denies sinus pressure, nasal congestion, ear pain or sore throat. Denies chest congestion, productive cough or wheezing. Denies chest pains, palpitations and leg swelling Denies abdominal pain, nausea, vomiting,diarrhea or constipation.   Denies dysuria, frequency, hesitancy or incontinence. Denies joint pain, swelling and limitation in mobility. Denies headaches, seizures, numbness, or tingling. Denies depression, anxiety or insomnia. Denies skin break down or rash.       Observations/Objective: BP 120/80   Pulse 84   Ht 5\' 2"  (1.575 m)   Wt 220 lb (99.8 kg)   BMI 40.24 kg/m   Good communication with no confusion and intact memory. Alert and oriented x 3 No signs of respiratory distress during speech    Assessment and Plan:  Lab test positive for detection of COVID-19 virus Only loss of tsaste and smell, never fever, improving, 5 day course of prednisone is prescribed  Essential hypertension Controlled, no change in medication   Hyperlipidemia LDL goal <100 Hyperlipidemia:Low fat diet discussed and encouraged.   Lipid Panel  Lab  Results  Component Value Date   CHOL 159 06/06/2019   HDL 68 06/06/2019   LDLCALC 77 06/06/2019   TRIG 67 06/06/2019   CHOLHDL 2.3 06/06/2019   Controlled, no change in medication      Follow Up Instructions:    I discussed the assessment and treatment plan with the patient. The patient was provided an opportunity to ask questions and all were answered. The patient agreed with the plan and demonstrated an understanding of the instructions.   The patient was advised to call back or seek an in-person evaluation if the symptoms worsen or if the condition fails to improve as anticipated.  I provided 12  minutes of non-face-to-face time during this encounter.   Tula Nakayama, MD

## 2019-09-25 ENCOUNTER — Ambulatory Visit: Payer: Medicare Other | Admitting: Family Medicine

## 2019-09-28 NOTE — Assessment & Plan Note (Signed)
Controlled, no change in medication  

## 2019-09-28 NOTE — Assessment & Plan Note (Signed)
Hyperlipidemia:Low fat diet discussed and encouraged.   Lipid Panel  Lab Results  Component Value Date   CHOL 159 06/06/2019   HDL 68 06/06/2019   LDLCALC 77 06/06/2019   TRIG 67 06/06/2019   CHOLHDL 2.3 06/06/2019   Controlled, no change in medication

## 2019-10-07 ENCOUNTER — Other Ambulatory Visit: Payer: Self-pay | Admitting: Family Medicine

## 2019-11-18 DIAGNOSIS — H2513 Age-related nuclear cataract, bilateral: Secondary | ICD-10-CM | POA: Diagnosis not present

## 2019-11-18 DIAGNOSIS — H25013 Cortical age-related cataract, bilateral: Secondary | ICD-10-CM | POA: Diagnosis not present

## 2019-11-18 DIAGNOSIS — H35033 Hypertensive retinopathy, bilateral: Secondary | ICD-10-CM | POA: Diagnosis not present

## 2019-11-18 DIAGNOSIS — R7309 Other abnormal glucose: Secondary | ICD-10-CM | POA: Diagnosis not present

## 2019-11-18 LAB — HM DIABETES EYE EXAM

## 2019-12-04 ENCOUNTER — Other Ambulatory Visit: Payer: Self-pay | Admitting: Family Medicine

## 2019-12-24 ENCOUNTER — Other Ambulatory Visit: Payer: Self-pay | Admitting: Family Medicine

## 2019-12-31 ENCOUNTER — Encounter: Payer: Self-pay | Admitting: Family Medicine

## 2019-12-31 ENCOUNTER — Other Ambulatory Visit: Payer: Self-pay

## 2019-12-31 ENCOUNTER — Ambulatory Visit (INDEPENDENT_AMBULATORY_CARE_PROVIDER_SITE_OTHER): Payer: Medicare PPO | Admitting: Family Medicine

## 2019-12-31 DIAGNOSIS — I10 Essential (primary) hypertension: Secondary | ICD-10-CM | POA: Diagnosis not present

## 2019-12-31 DIAGNOSIS — N898 Other specified noninflammatory disorders of vagina: Secondary | ICD-10-CM | POA: Insufficient documentation

## 2019-12-31 DIAGNOSIS — H6122 Impacted cerumen, left ear: Secondary | ICD-10-CM

## 2019-12-31 DIAGNOSIS — E785 Hyperlipidemia, unspecified: Secondary | ICD-10-CM | POA: Diagnosis not present

## 2019-12-31 DIAGNOSIS — K219 Gastro-esophageal reflux disease without esophagitis: Secondary | ICD-10-CM | POA: Diagnosis not present

## 2019-12-31 LAB — POCT URINALYSIS DIP (CLINITEK)
Bilirubin, UA: NEGATIVE
Glucose, UA: NEGATIVE mg/dL
Ketones, POC UA: NEGATIVE mg/dL
Leukocytes, UA: NEGATIVE
Nitrite, UA: NEGATIVE
POC PROTEIN,UA: NEGATIVE
Spec Grav, UA: 1.025 (ref 1.010–1.025)
Urobilinogen, UA: 0.2 E.U./dL
pH, UA: 5 (ref 5.0–8.0)

## 2019-12-31 MED ORDER — ESTROGENS, CONJUGATED 0.625 MG/GM VA CREA: 1.0000 | TOPICAL_CREAM | Freq: Every day | VAGINAL | 12 refills | Status: DC

## 2019-12-31 MED ORDER — CARBAMIDE PEROXIDE 6.5 % OT SOLN: 5.0000 [drp] | Freq: Two times a day (BID) | OTIC | 0 refills | Status: AC

## 2019-12-31 NOTE — Assessment & Plan Note (Signed)
Denies having any trouble with her heartburn.  Continues Protonix at this time.

## 2019-12-31 NOTE — Assessment & Plan Note (Signed)
Alexis Hurst is encouraged to maintain a well balanced diet that is low in salt. Controlled, continue current medication regimen.   Additionally, she is also reminded that exercise is beneficial for heart health and control of  Blood pressure. 30-60 minutes daily is recommended-walking was suggested.

## 2019-12-31 NOTE — Assessment & Plan Note (Signed)
Needs to get updated labs.  Encourage low-fat diet

## 2019-12-31 NOTE — Assessment & Plan Note (Signed)
Irrigation of the left ear.  Inability to clear the ear fully to visualize the tympanic membrane.  Supplied with Debrox prescription follow-up in 10 days

## 2019-12-31 NOTE — Assessment & Plan Note (Signed)
Excessive vaginal itching and irritation will try Premarin cream to see if this will help

## 2019-12-31 NOTE — Progress Notes (Signed)
Subjective:  Patient ID: Alexis Hurst, female    DOB: 06-Jul-1951  Age: 69 y.o. MRN: [id number]  CC:  Chief Complaint  Patient presents with  . Ear Pain    left ear pain washed hair water got in it and it has felt full ever since sore when she pushes behind ear x2 weeks  . Vaginal Pain    ithcing on the outside has been going on for awhile has tried monistat a few times       HPI  HPI  Alexis Hurst is a 69 year old female patient who presents today with left ear pain and some vaginal itching and discomfort.  Left ear: She reports washing her hair about 2 weeks ago and noticed that she got water in it.  She reports that it is felt full ever since and a little bit sore when she pushes behind the ear about 2 weeks going on now.  Denies having any nasal drainage, fever, chills, headache, facial pain, sinus pressure, sore throat or any other signs or symptoms of infection.  Vaginal pain: Reports has been itching outside along the labia, been going on for a while she is tried Monistat at times but nothing ever helps.  She denies having any vaginal drainage or discharge.  Denies having any exposure to STDs.  Denies having any changes in personal hygiene products.  Denies having any back pain, pelvic pain or abdominal pain.  Denies having any nausea or vomiting or associated symptoms.     Today patient denies signs and symptoms of COVID 19 infection including fever, chills, cough, shortness of breath, and headache. Past Medical, Surgical, Social History, Allergies, and Medications have been Reviewed.   Past Medical History:  Diagnosis Date  . Allergic urticaria 04/02/2017  . Anxiety   . Depression   . Diverticulosis 12/11/2013   Colonoscopy in 2014 per Dr Oneida Alar, also has small hemmorhoids   . Dizziness    Mild Orthostatic  . Essential hypertension   . Fatigue   . History of chest pain    Negative stress echo 2010  . Hyperlipidemia   . Insomnia   . Lab test positive for  detection of COVID-19 virus 09/24/2019  . Mitral valve prolapse   . Non-allergic rhinitis 10/31/2017  . Obesity   . Paresthesias    Upper extremity /episode sensation of coldness and numbness   . Piles (hemorrhoids) 07/20/2015  . Raynauds phenomenon    Possible  . Type 2 diabetes mellitus (HCC)     Current Meds  Medication Sig  . albuterol (VENTOLIN HFA) 108 (90 Base) MCG/ACT inhaler Inhale 2 puffs into the lungs every 6 (six) hours as needed for wheezing or shortness of breath.  Marland Kitchen amLODipine (NORVASC) 5 MG tablet TAKE 1 TABLET(5 MG) BY MOUTH DAILY  . aspirin (ASPIRIN LOW DOSE) 81 MG EC tablet Take 81 mg by mouth daily.    Marland Kitchen atorvastatin (LIPITOR) 10 MG tablet Take 1 tablet (10 mg total) by mouth daily.  Marland Kitchen azelastine (ASTELIN) 0.1 % nasal spray Place 2 sprays into both nostrils 2 (two) times daily. Use in each nostril as directed  . Calcium Carbonate-Vitamin D (CALCIUM + D) 600-200 MG-UNIT TABS Take 1 tablet by mouth daily.   . carvedilol (COREG) 6.25 MG tablet Take 1 tablet (6.25 mg total) by mouth 2 (two) times daily with a meal.  . chlorpheniramine (CHLOR-TRIMETON) 4 MG tablet Take 1 tablet (4 mg total) by mouth 2 (two) times daily as needed  for allergies.  . cloNIDine (CATAPRES) 0.2 MG tablet Take 1 tablet (0.2 mg total) by mouth daily.  Marland Kitchen FLUoxetine (PROZAC) 40 MG capsule TAKE 1 CAPSULE(40 MG) BY MOUTH DAILY  . fluticasone (FLONASE) 50 MCG/ACT nasal spray SHAKE LIQUID AND USE 2 SPRAYS IN EACH NOSTRIL DAILY  . glucose blood (ONE TOUCH ULTRA TEST) test strip Use as instructed once daily dx e11.9  . metFORMIN (GLUCOPHAGE) 1000 MG tablet TAKE 1 TABLET BY MOUTH EVERY DAY WITH BREAKFAST  . montelukast (SINGULAIR) 10 MG tablet TAKE 1 TABLET(10 MG) BY MOUTH AT BEDTIME  . pantoprazole (PROTONIX) 40 MG tablet TAKE 1 TABLET(40 MG) BY MOUTH TWICE DAILY BEFORE A MEAL  . spironolactone (ALDACTONE) 25 MG tablet TAKE 1 TABLET(25 MG) BY MOUTH DAILY  . Tiotropium Bromide Monohydrate (SPIRIVA RESPIMAT)  1.25 MCG/ACT AERS Inhale 2 puffs into the lungs daily.  Marland Kitchen venlafaxine XR (EFFEXOR-XR) 75 MG 24 hr capsule TAKE 1 CAPSULE(75 MG) BY MOUTH DAILY WITH BREAKFAST  . Vitamin D, Ergocalciferol, (DRISDOL) 1.25 MG (50000 UNIT) CAPS capsule TAKE 1 CAPSULE BY MOUTH ONCE A WEEK    ROS:  Review of Systems  HENT: Positive for ear pain.   Eyes: Negative.   Respiratory: Negative.   Cardiovascular: Negative.   Gastrointestinal: Negative.   Genitourinary: Negative.        See HPI  Musculoskeletal: Negative.   Skin: Negative.   Neurological: Negative.   Endo/Heme/Allergies: Negative.   Psychiatric/Behavioral: Negative.   All other systems reviewed and are negative.    Objective:   Today's Vitals: BP 140/88 (BP Location: Right Arm, Patient Position: Sitting, Cuff Size: Normal)   Pulse (!) 107   Temp 97.8 F (36.6 C) (Temporal)   Ht 5\' 4"  (1.626 m)   Wt 218 lb 6.4 oz (99.1 kg)   SpO2 97%   BMI 37.49 kg/m  Vitals with BMI 12/31/2019 09/28/2019 09/16/2019  Height 5\' 4"  5\' 2"  -  Weight 218 lbs 6 oz 220 lbs -  BMI 0000000 [id number] -  Systolic [id number] (No Data)  Diastolic 88 80 (No Data)  Pulse 107 84 -     Physical Exam Vitals and nursing note reviewed.  Constitutional:      Appearance: Normal appearance. She is well-developed and well-groomed. She is obese.  HENT:     Head: Normocephalic and atraumatic.     Right Ear: Hearing, tympanic membrane, ear canal and external ear normal.     Left Ear: External ear normal. Decreased hearing noted. There is impacted cerumen.     Nose: Nose normal.     Mouth/Throat:     Mouth: Mucous membranes are moist.     Pharynx: Oropharynx is clear.     Comments: Mask in place Eyes:     General:        Right eye: No discharge.        Left eye: No discharge.     Conjunctiva/sclera: Conjunctivae normal.  Cardiovascular:     Rate and Rhythm: Normal rate and regular rhythm.     Pulses: Normal pulses.     Heart sounds: Normal heart sounds.  Pulmonary:      Effort: Pulmonary effort is normal.     Breath sounds: Normal breath sounds.  Musculoskeletal:        General: Normal range of motion.     Cervical back: Normal range of motion and neck supple.  Skin:    General: Skin is warm.  Neurological:     General: No focal deficit  present.     Mental Status: She is alert and oriented to person, place, and time.  Psychiatric:        Attention and Perception: Attention normal.        Mood and Affect: Mood normal.        Speech: Speech normal.        Behavior: Behavior normal. Behavior is cooperative.        Thought Content: Thought content normal.        Cognition and Memory: Cognition normal.        Judgment: Judgment normal.     Procedure: Unilateral ear irrigation to the left, post irrigation still no visibility of the tympanic membrane.  Patient noted tenderness to procedure was stopped.    Assessment   1. Morbid obesity ([place])   2. Essential hypertension   3. Hyperlipidemia LDL goal <100   4. Gastroesophageal reflux disease, unspecified whether esophagitis present   5. Vaginal itching   6. Impacted cerumen of left ear     Tests ordered Orders Placed This Encounter  Procedures  . POCT URINALYSIS DIP (CLINITEK)     Plan: Please see assessment and plan per problem list above.   Meds ordered this encounter  Medications  . carbamide peroxide (DEBROX) 6.5 % OTIC solution    Sig: Place 5 drops into the left ear 2 (two) times daily for 7 days.    Dispense:  15 mL    Refill:  0    Order Specific Question:   Supervising Provider    Answer:   SIMPSON, MARGARET E [id number]  . conjugated estrogens (PREMARIN) vaginal cream    Sig: Place 1 Applicatorful vaginally daily.    Dispense:  42.5 g    Refill:  12    Order Specific Question:   Supervising Provider    Answer:   Fayrene Helper [id number]      Patient to follow-up in 4 months Perlie Mayo, NP

## 2019-12-31 NOTE — Assessment & Plan Note (Signed)
Alexis Hurst is re-educated about the importance of exercise daily to help with weight management. A minumum of 30 minutes daily is recommended. Additionally, importance of healthy food choices with portion control discussed.   Wt Readings from Last 3 Encounters:  12/31/19 218 lb 6.4 oz (99.1 kg)  09/28/19 220 lb (99.8 kg)  06/19/19 215 lb (97.5 kg)

## 2019-12-31 NOTE — Patient Instructions (Addendum)
I appreciate the opportunity to provide you with care for your health and wellness. Today we discussed: ear pain and over chronic conditions  Follow up: 10 days for ear check and move May appt out by 4 months   No labs or referrals today  Use cream and ear drops as directed on container. If you have any issues please call the office.  See you back in 10 days for ear check :)  Please continue to practice social distancing to keep you, your family, and our community safe.  If you must go out, please wear a mask and practice good handwashing.  It was a pleasure to see you and I look forward to continuing to work together on your health and well-being. Please do not hesitate to call the office if you need care or have questions about your care.  Have a wonderful day and week. With Gratitude, Cherly Beach, DNP, AGNP-BC

## 2020-01-13 ENCOUNTER — Ambulatory Visit: Payer: Medicare PPO | Admitting: Family Medicine

## 2020-01-14 ENCOUNTER — Other Ambulatory Visit: Payer: Self-pay | Admitting: Family Medicine

## 2020-01-19 DIAGNOSIS — H2511 Age-related nuclear cataract, right eye: Secondary | ICD-10-CM | POA: Diagnosis not present

## 2020-01-19 DIAGNOSIS — H25013 Cortical age-related cataract, bilateral: Secondary | ICD-10-CM | POA: Diagnosis not present

## 2020-01-19 DIAGNOSIS — H2589 Other age-related cataract: Secondary | ICD-10-CM | POA: Diagnosis not present

## 2020-01-19 DIAGNOSIS — H2513 Age-related nuclear cataract, bilateral: Secondary | ICD-10-CM | POA: Diagnosis not present

## 2020-01-19 LAB — HM DIABETES EYE EXAM

## 2020-01-20 ENCOUNTER — Encounter: Payer: Self-pay | Admitting: *Deleted

## 2020-01-23 ENCOUNTER — Other Ambulatory Visit: Payer: Self-pay

## 2020-01-23 ENCOUNTER — Ambulatory Visit (INDEPENDENT_AMBULATORY_CARE_PROVIDER_SITE_OTHER): Payer: Medicare PPO | Admitting: Family Medicine

## 2020-01-23 ENCOUNTER — Encounter: Payer: Self-pay | Admitting: Family Medicine

## 2020-01-23 VITALS — BP 132/77 | HR 93 | Temp 97.1°F | Ht 64.0 in | Wt 218.4 lb

## 2020-01-23 DIAGNOSIS — I10 Essential (primary) hypertension: Secondary | ICD-10-CM | POA: Diagnosis not present

## 2020-01-23 DIAGNOSIS — E785 Hyperlipidemia, unspecified: Secondary | ICD-10-CM | POA: Diagnosis not present

## 2020-01-23 DIAGNOSIS — H6122 Impacted cerumen, left ear: Secondary | ICD-10-CM

## 2020-01-23 LAB — POCT GLYCOSYLATED HEMOGLOBIN (HGB A1C)
HbA1c POC (<> result, manual entry): 5.8 % (ref 4.0–5.6)
HbA1c, POC (controlled diabetic range): 5.8 % (ref 0.0–7.0)
HbA1c, POC (prediabetic range): 5.8 % (ref 5.7–6.4)
Hemoglobin A1C: 5.8 % — AB (ref 4.0–5.6)

## 2020-01-23 NOTE — Patient Instructions (Addendum)
I appreciate the opportunity to provide you with care for your health and wellness. Today we discussed: Left ear check  Follow up: 3-4 weeks for Left ear check   Labs today at [facility] in the office  Continue Debrox 2-3 days a week  Please continue to practice social distancing to keep you, your family, and our community safe.  If you must go out, please wear a mask and practice good handwashing.  It was a pleasure to see you and I look forward to continuing to work together on your health and well-being. Please do not hesitate to call the office if you need care or have questions about your care.  Have a wonderful day and week. With Gratitude, Cherly Beach, DNP, AGNP-BC

## 2020-01-23 NOTE — Progress Notes (Signed)
Subjective:  Patient ID: Alexis Hurst, female    DOB: [birth year]  Age: 69 y.o. MRN: [id number]  CC:  Chief Complaint  Patient presents with  . Ear Pain  . Follow-up      HPI  HPI  Alexis Hurst is a 69 year old female patient who presents today for follow-up 10 days post having irrigation in the office.  Alexis Hurst was sent home on Debrox taken to help it unclogged.  Today Alexis Hurst is back to see if we can flush her ear out.  Alexis Hurst reports that her left ear pain improved.  Still clogged has not seen any earwax since doing the Debrox.  Additionally Alexis Hurst wants to report that Alexis Hurst has an uncomfortable feeling under the right arm it feels swollen and uncomfortable, no redness, no injury no trauma.  Denies having any recent bug bites or insect bites.  Updated mammogram was in October negative for any findings.  Alexis Hurst does report that Alexis Hurst did a lot of gardening over the week.  And is right-handed which is aside that Alexis Hurst is having the discomfort on.  Alexis Hurst reports that just started this morning.  Alexis Hurst reports taking all her medications without any issues or concerns.  Overall Alexis Hurst feels like Alexis Hurst is doing well her ear is not hurting anymore Alexis Hurst just feels like it is a little bit clogged.  And Alexis Hurst has this discomfort as stated above under her right arm.  Alexis Hurst denies having any chest pain, shortness of breath, fevers, chills, leg swelling, palpitations, headaches, dizziness, vision changes.  Alexis Hurst denies having any changes in bowel or bladder habits.  Today patient denies signs and symptoms of COVID 19 infection including fever, chills, cough, shortness of breath, and headache. Past Medical, Surgical, Social History, Allergies, and Medications have been Reviewed.   Past Medical History:  Diagnosis Date  . Allergic urticaria 04/02/2017  . Anxiety   . Depression   . Diverticulosis 12/11/2013   Colonoscopy in 2014 per Dr Oneida Alar, also has small hemmorhoids   . Dizziness    Mild Orthostatic  . Essential hypertension    . Fatigue   . History of chest pain    Negative stress echo 2010  . Hyperlipidemia   . Insomnia   . Lab test positive for detection of COVID-19 virus 09/24/2019  . Mitral valve prolapse   . Non-allergic rhinitis 10/31/2017  . Obesity   . Paresthesias    Upper extremity /episode sensation of coldness and numbness   . Piles (hemorrhoids) 07/20/2015  . Raynauds phenomenon    Possible  . Type 2 diabetes mellitus (HCC)     Current Meds  Medication Sig  . albuterol (VENTOLIN HFA) 108 (90 Base) MCG/ACT inhaler Inhale 2 puffs into the lungs every 6 (six) hours as needed for wheezing or shortness of breath.  Marland Kitchen amLODipine (NORVASC) 5 MG tablet TAKE 1 TABLET(5 MG) BY MOUTH DAILY  . aspirin (ASPIRIN LOW DOSE) 81 MG EC tablet Take 81 mg by mouth daily.    Marland Kitchen atorvastatin (LIPITOR) 10 MG tablet Take 1 tablet (10 mg total) by mouth daily.  Marland Kitchen azelastine (ASTELIN) 0.1 % nasal spray Place 2 sprays into both nostrils 2 (two) times daily. Use in each nostril as directed  . Calcium Carbonate-Vitamin D (CALCIUM + D) 600-200 MG-UNIT TABS Take 1 tablet by mouth daily.   . carvedilol (COREG) 6.25 MG tablet Take 1 tablet (6.25 mg total) by mouth 2 (two) times daily with a meal.  . chlorpheniramine (CHLOR-TRIMETON) 4  MG tablet Take 1 tablet (4 mg total) by mouth 2 (two) times daily as needed for allergies.  . cloNIDine (CATAPRES) 0.2 MG tablet TAKE 1 TABLET BY MOUTH EVERY DAY  . conjugated estrogens (PREMARIN) vaginal cream Place 1 Applicatorful vaginally daily.  Marland Kitchen FLUoxetine (PROZAC) 40 MG capsule TAKE 1 CAPSULE(40 MG) BY MOUTH DAILY  . fluticasone (FLONASE) 50 MCG/ACT nasal spray SHAKE LIQUID AND USE 2 SPRAYS IN EACH NOSTRIL DAILY  . glucose blood (ONE TOUCH ULTRA TEST) test strip Use as instructed once daily dx e11.9  . metFORMIN (GLUCOPHAGE) 1000 MG tablet TAKE 1 TABLET BY MOUTH EVERY DAY WITH BREAKFAST  . montelukast (SINGULAIR) 10 MG tablet TAKE 1 TABLET(10 MG) BY MOUTH AT BEDTIME  . pantoprazole  (PROTONIX) 40 MG tablet TAKE 1 TABLET(40 MG) BY MOUTH TWICE DAILY BEFORE A MEAL  . spironolactone (ALDACTONE) 25 MG tablet TAKE 1 TABLET(25 MG) BY MOUTH DAILY  . Tiotropium Bromide Monohydrate (SPIRIVA RESPIMAT) 1.25 MCG/ACT AERS Inhale 2 puffs into the lungs daily.  Marland Kitchen venlafaxine XR (EFFEXOR-XR) 75 MG 24 hr capsule TAKE 1 CAPSULE(75 MG) BY MOUTH DAILY WITH BREAKFAST  . Vitamin D, Ergocalciferol, (DRISDOL) 1.25 MG (50000 UNIT) CAPS capsule TAKE 1 CAPSULE BY MOUTH ONCE A WEEK    ROS:  Review of Systems  Constitutional: Negative.   HENT: Negative.        See HPI  Eyes: Negative.   Respiratory: Negative.   Cardiovascular: Negative.   Gastrointestinal: Negative.   Genitourinary: Negative.   Musculoskeletal: Negative.        As stated above in HPI.  Skin: Negative.   Neurological: Negative.   Endo/Heme/Allergies: Negative.   Psychiatric/Behavioral: Negative.   All other systems reviewed and are negative.    Objective:   Today's Vitals: BP 132/77 (BP Location: Right Arm, Patient Position: Sitting, Cuff Size: Normal)   Pulse 93   Temp (!) 97.1 F (36.2 C)   Ht 5\' 4"  (1.626 m)   Wt 218 lb 6.4 oz (99.1 kg)   SpO2 97%   BMI 37.49 kg/m  Vitals with BMI 01/23/2020 12/31/2019 09/28/2019  Height 5\' 4"  5\' 4"  5\' 2"   Weight 218 lbs 6 oz 218 lbs 6 oz 220 lbs  BMI 37.47 0000000 123XX123  Systolic [id number]  Diastolic 77 88 80  Pulse 93 107 84     Physical Exam Vitals and nursing note reviewed.  Constitutional:      Appearance: Normal appearance. Alexis Hurst is well-developed and well-groomed. Alexis Hurst is obese.  HENT:     Head: Normocephalic and atraumatic.     Right Ear: Hearing, tympanic membrane, ear canal and external ear normal.     Left Ear: Ear canal and external ear normal. There is impacted cerumen.     Ears:     Comments: Partial cerumen impaction.    Nose: Nose normal.     Mouth/Throat:     Comments: Mask in place Eyes:     General:        Right eye: No discharge.        Left eye:  No discharge.     Conjunctiva/sclera: Conjunctivae normal.  Cardiovascular:     Rate and Rhythm: Normal rate and regular rhythm.     Pulses: Normal pulses.     Heart sounds: Normal heart sounds.  Pulmonary:     Effort: Pulmonary effort is normal.     Breath sounds: Normal breath sounds.  Musculoskeletal:        General: Normal range  of motion.     Cervical back: Normal range of motion and neck supple.  Skin:    General: Skin is warm.  Neurological:     General: No focal deficit present.     Mental Status: Alexis Hurst is alert and oriented to person, place, and time.  Psychiatric:        Attention and Perception: Attention normal.        Mood and Affect: Mood normal.        Speech: Speech normal.        Behavior: Behavior normal. Behavior is cooperative.        Thought Content: Thought content normal.        Cognition and Memory: Cognition normal.        Judgment: Judgment normal.    Procedure: Consent obtained, irrigation of left ear.  Mild discomfort with removal of some wax.  Partial visualization of tympanic membrane seen.  Irrigation stopped secondary to discomfort.   Assessment   1. Essential hypertension   2. Hyperlipidemia LDL goal <100   3. Impacted cerumen of left ear     Tests ordered Orders Placed This Encounter  Procedures  . CBC  . COMPLETE METABOLIC PANEL WITH GFR  . Lipid panel  . POCT glycosylated hemoglobin (Hb A1C)     Plan: Please see assessment and plan per problem list above.   No orders of the defined types were placed in this encounter.   Patient to follow-up in 02/12/2020   Perlie Mayo, NP

## 2020-01-27 NOTE — Assessment & Plan Note (Signed)
Updated labs ordered.  Encouraged heart healthy low-fat diet. 

## 2020-01-27 NOTE — Assessment & Plan Note (Signed)
Irrigation of left ear secondary to still having inability to visualize tympanic membranes only partially.  Encouraged to use Debrox 2-3 times a week for the next 3 to 4 weeks if inability to fully clear the ear at next appointment is seen will do referral to ENT.  Reviewed side effects, risks and benefits of medication.   Patient acknowledged agreement and understanding of the plan.

## 2020-01-27 NOTE — Assessment & Plan Note (Signed)
Updated labs ordered.  Continue medications as directed.  DASH diet and 30 minutes of daily exercise recommended.

## 2020-01-30 DIAGNOSIS — E785 Hyperlipidemia, unspecified: Secondary | ICD-10-CM | POA: Diagnosis not present

## 2020-01-30 DIAGNOSIS — I10 Essential (primary) hypertension: Secondary | ICD-10-CM | POA: Diagnosis not present

## 2020-01-31 LAB — COMPLETE METABOLIC PANEL WITH GFR
AG Ratio: 1.4 (calc) (ref 1.0–2.5)
ALT: 30 U/L — ABNORMAL HIGH (ref 6–29)
AST: 22 U/L (ref 10–35)
Albumin: 4 g/dL (ref 3.6–5.1)
Alkaline phosphatase (APISO): 98 U/L (ref 37–153)
BUN: 10 mg/dL (ref 7–25)
CO2: 24 mmol/L (ref 20–32)
Calcium: 9.1 mg/dL (ref 8.6–10.4)
Chloride: 106 mmol/L (ref 98–110)
Creat: 0.85 mg/dL (ref 0.50–0.99)
GFR, Est African American: 83 mL/min/{1.73_m2} (ref >=60)
GFR, Est Non African American: 71 mL/min/{1.73_m2} (ref >=60)
Globulin: 2.8 g/dL (calc) (ref 1.9–3.7)
Glucose, Bld: 100 mg/dL — ABNORMAL HIGH (ref 65–99)
Potassium: 4 mmol/L (ref 3.5–5.3)
Sodium: 141 mmol/L (ref 135–146)
Total Bilirubin: 0.3 mg/dL (ref 0.2–1.2)
Total Protein: 6.8 g/dL (ref 6.1–8.1)

## 2020-01-31 LAB — CBC
HCT: 38.5 % (ref 35.0–45.0)
Hemoglobin: 12.5 g/dL (ref 11.7–15.5)
MCH: 29.4 pg (ref 27.0–33.0)
MCHC: 32.5 g/dL (ref 32.0–36.0)
MCV: 90.6 fL (ref 80.0–100.0)
MPV: 10.4 fL (ref 7.5–12.5)
Platelets: 298 10*3/uL (ref 140–400)
RBC: 4.25 10*6/uL (ref 3.80–5.10)
RDW: 13.2 % (ref 11.0–15.0)
WBC: 4.3 10*3/uL (ref 3.8–10.8)

## 2020-01-31 LAB — LIPID PANEL
Cholesterol: 158 mg/dL (ref <200)
HDL: 71 mg/dL (ref >=50)
LDL Cholesterol (Calc): 71 mg/dL (calc)
Non-HDL Cholesterol (Calc): 87 mg/dL (calc) (ref <130)
Total CHOL/HDL Ratio: 2.2 (calc) (ref <5.0)
Triglycerides: 79 mg/dL (ref <150)

## 2020-02-03 HISTORY — PX: CATARACT EXTRACTION: SUR2

## 2020-02-06 ENCOUNTER — Other Ambulatory Visit: Payer: Self-pay | Admitting: Family Medicine

## 2020-02-06 ENCOUNTER — Other Ambulatory Visit: Payer: Self-pay

## 2020-02-06 DIAGNOSIS — R748 Abnormal levels of other serum enzymes: Secondary | ICD-10-CM

## 2020-02-09 HISTORY — PX: CATARACT EXTRACTION: SUR2

## 2020-02-12 ENCOUNTER — Other Ambulatory Visit: Payer: Self-pay

## 2020-02-12 ENCOUNTER — Ambulatory Visit: Payer: Medicare PPO | Admitting: Family Medicine

## 2020-02-12 ENCOUNTER — Encounter: Payer: Self-pay | Admitting: Family Medicine

## 2020-02-12 VITALS — BP 146/85 | HR 64 | Temp 97.2°F | Ht 64.0 in | Wt 217.1 lb

## 2020-02-12 DIAGNOSIS — H6122 Impacted cerumen, left ear: Secondary | ICD-10-CM

## 2020-02-12 NOTE — Progress Notes (Signed)
Subjective:  Patient ID: Alexis Hurst, female    DOB: [birth year]  Age: 69 y.o. MRN: [id number]  CC:  Chief Complaint  Patient presents with   Follow-up      HPI  HPI   Alexis Hurst is a 69 year old female patient who presents today for follow-up on left ear pain.  Previous appointments we were trying to irrigate flush and remove the earwax.  I could not see the tympanic membrane behind the ear.  Today she reports that she is hearing better but she has some throbbing pain and when it is at its highest is a 4 out of 10.  But it comes and goes.  Is not consistent.  She denies having any signs or symptoms of infection or illness.  Today patient denies signs and symptoms of COVID 19 infection including fever, chills, cough, shortness of breath, and headache. Past Medical, Surgical, Social History, Allergies, and Medications have been Reviewed.   Past Medical History:  Diagnosis Date   Allergic sinusitis 12/18/2012   Allergic urticaria 04/02/2017   Anxiety    Depression    Diverticulosis 12/11/2013   Colonoscopy in 2014 per Dr Oneida Alar, also has small hemmorhoids    Dizziness    Mild Orthostatic   Essential hypertension    Fatigue    History of chest pain    Negative stress echo 2010   Hyperlipidemia    Insomnia    Lab test positive for detection of COVID-19 virus 09/24/2019   Mitral valve prolapse    Non-allergic rhinitis 10/31/2017   Obesity    Paresthesias    Upper extremity /episode sensation of coldness and numbness    Piles (hemorrhoids) 07/20/2015   Raynauds phenomenon    Possible   Type 2 diabetes mellitus (HCC)     Current Meds  Medication Sig   albuterol (VENTOLIN HFA) 108 (90 Base) MCG/ACT inhaler Inhale 2 puffs into the lungs every 6 (six) hours as needed for wheezing or shortness of breath.   amLODipine (NORVASC) 5 MG tablet TAKE 1 TABLET(5 MG) BY MOUTH DAILY   aspirin (ASPIRIN LOW DOSE) 81 MG EC tablet Take 81 mg by mouth daily.      atorvastatin (LIPITOR) 10 MG tablet Take 1 tablet (10 mg total) by mouth daily.   azelastine (ASTELIN) 0.1 % nasal spray Place 2 sprays into both nostrils 2 (two) times daily. Use in each nostril as directed   Calcium Carbonate-Vitamin D (CALCIUM + D) 600-200 MG-UNIT TABS Take 1 tablet by mouth daily.    carvedilol (COREG) 6.25 MG tablet Take 1 tablet (6.25 mg total) by mouth 2 (two) times daily with a meal.   chlorpheniramine (CHLOR-TRIMETON) 4 MG tablet Take 1 tablet (4 mg total) by mouth 2 (two) times daily as needed for allergies.   cloNIDine (CATAPRES) 0.2 MG tablet TAKE 1 TABLET BY MOUTH EVERY DAY   conjugated estrogens (PREMARIN) vaginal cream Place 1 Applicatorful vaginally daily.   FLUoxetine (PROZAC) 40 MG capsule TAKE 1 CAPSULE(40 MG) BY MOUTH DAILY   fluticasone (FLONASE) 50 MCG/ACT nasal spray SHAKE LIQUID AND USE 2 SPRAYS IN EACH NOSTRIL DAILY   glucose blood (ONE TOUCH ULTRA TEST) test strip Use as instructed once daily dx e11.9   metFORMIN (GLUCOPHAGE) 1000 MG tablet TAKE 1 TABLET BY MOUTH EVERY DAY WITH BREAKFAST   montelukast (SINGULAIR) 10 MG tablet TAKE 1 TABLET(10 MG) BY MOUTH AT BEDTIME   pantoprazole (PROTONIX) 40 MG tablet TAKE 1 TABLET(40 MG) BY MOUTH TWICE  DAILY BEFORE A MEAL   spironolactone (ALDACTONE) 25 MG tablet TAKE 1 TABLET(25 MG) BY MOUTH DAILY   Tiotropium Bromide Monohydrate (SPIRIVA RESPIMAT) 1.25 MCG/ACT AERS Inhale 2 puffs into the lungs daily.   venlafaxine XR (EFFEXOR-XR) 75 MG 24 hr capsule TAKE 1 CAPSULE(75 MG) BY MOUTH DAILY WITH BREAKFAST   Vitamin D, Ergocalciferol, (DRISDOL) 1.25 MG (50000 UNIT) CAPS capsule TAKE 1 CAPSULE BY MOUTH ONCE A WEEK    ROS:  Review of Systems  Constitutional: Negative.   HENT: Positive for ear pain.   Eyes: Negative.   Respiratory: Negative.   Cardiovascular: Negative.   Gastrointestinal: Negative.   Genitourinary: Negative.        Vaginal itching has improved however the Premarin cream is very  messy and she wanted know if this was something that she was going to have to continue doing.  Musculoskeletal: Negative.   Skin: Negative.   Neurological: Negative.   Endo/Heme/Allergies: Negative.   Psychiatric/Behavioral: Negative.   All other systems reviewed and are negative.    Objective:   Today's Vitals: BP (!) 146/85 (BP Location: Right Arm, Patient Position: Sitting, Cuff Size: Normal)    Pulse 64    Temp (!) 97.2 F (36.2 C) (Temporal)    Ht 5\' 4"  (1.626 m)    Wt 217 lb 1.9 oz (98.5 kg)    SpO2 96%    BMI 37.27 kg/m  Vitals with BMI 02/12/2020 01/23/2020 12/31/2019  Height 5\' 4"  5\' 4"  5\' 4"   Weight 217 lbs 2 oz 218 lbs 6 oz 218 lbs 6 oz  BMI 37.25 73.41 93.79  Systolic 024 097 353  Diastolic 85 77 88  Pulse 64 93 107     Physical Exam Vitals and nursing note reviewed.  Constitutional:      Appearance: Normal appearance. She is well-developed and well-groomed. She is obese.  HENT:     Head: Normocephalic and atraumatic.     Right Ear: Hearing, tympanic membrane, ear canal and external ear normal.     Left Ear: Hearing, ear canal and external ear normal.     Ears:     Comments: Partial cerumen impaction not able to see the tympanic membrane well.    Mouth/Throat:     Comments: Mask in place Eyes:     General:        Right eye: No discharge.        Left eye: No discharge.     Conjunctiva/sclera: Conjunctivae normal.  Cardiovascular:     Rate and Rhythm: Normal rate and regular rhythm.     Pulses: Normal pulses.     Heart sounds: Normal heart sounds.  Pulmonary:     Effort: Pulmonary effort is normal.     Breath sounds: Normal breath sounds.  Musculoskeletal:        General: Normal range of motion.     Cervical back: Normal range of motion and neck supple.  Skin:    General: Skin is warm.  Neurological:     General: No focal deficit present.     Mental Status: She is alert and oriented to person, place, and time.  Psychiatric:        Attention and  Perception: Attention normal.        Mood and Affect: Mood normal.        Speech: Speech normal.        Behavior: Behavior normal. Behavior is cooperative.        Thought Content: Thought content normal.  Cognition and Memory: Cognition normal.        Judgment: Judgment normal.     Assessment   1. Impacted cerumen of left ear     Tests ordered No orders of the defined types were placed in this encounter.    Plan: Please see assessment and plan per problem list above.   No orders of the defined types were placed in this encounter.   Patient to follow-up in 05/03/2020   Perlie Mayo, NP

## 2020-02-12 NOTE — Patient Instructions (Addendum)
I appreciate the opportunity to provide you with care for your health and wellness. Today we discussed: ear pain   Follow up: 05/03/2020  No labs   Referrals today ENT  Please continue to practice social distancing to keep you, your family, and our community safe.  If you must go out, please wear a mask and practice good handwashing.  It was a pleasure to see you and I look forward to continuing to work together on your health and well-being. Please do not hesitate to call the office if you need care or have questions about your care.  Have a wonderful day and week. With Gratitude, Cherly Beach, DNP, AGNP-BC

## 2020-02-12 NOTE — Assessment & Plan Note (Signed)
Inability to still visualize the tympanic membrane on exam.  As previously noted we will do a referral to ENT as she reports that she had a lot of discomfort post irrigation last time.

## 2020-02-18 DIAGNOSIS — H2521 Age-related cataract, morgagnian type, right eye: Secondary | ICD-10-CM | POA: Diagnosis not present

## 2020-02-18 DIAGNOSIS — H25811 Combined forms of age-related cataract, right eye: Secondary | ICD-10-CM | POA: Diagnosis not present

## 2020-02-22 ENCOUNTER — Other Ambulatory Visit: Payer: Self-pay | Admitting: Family Medicine

## 2020-02-24 DIAGNOSIS — H2512 Age-related nuclear cataract, left eye: Secondary | ICD-10-CM | POA: Diagnosis not present

## 2020-02-24 DIAGNOSIS — H2589 Other age-related cataract: Secondary | ICD-10-CM | POA: Diagnosis not present

## 2020-02-24 DIAGNOSIS — H25012 Cortical age-related cataract, left eye: Secondary | ICD-10-CM | POA: Diagnosis not present

## 2020-03-03 ENCOUNTER — Other Ambulatory Visit: Payer: Self-pay | Admitting: Family Medicine

## 2020-03-10 ENCOUNTER — Other Ambulatory Visit: Payer: Self-pay | Admitting: Family Medicine

## 2020-03-10 DIAGNOSIS — H25012 Cortical age-related cataract, left eye: Secondary | ICD-10-CM | POA: Diagnosis not present

## 2020-03-10 DIAGNOSIS — H2589 Other age-related cataract: Secondary | ICD-10-CM | POA: Diagnosis not present

## 2020-03-10 DIAGNOSIS — H2512 Age-related nuclear cataract, left eye: Secondary | ICD-10-CM | POA: Diagnosis not present

## 2020-03-10 DIAGNOSIS — H25812 Combined forms of age-related cataract, left eye: Secondary | ICD-10-CM | POA: Diagnosis not present

## 2020-03-15 DIAGNOSIS — R748 Abnormal levels of other serum enzymes: Secondary | ICD-10-CM | POA: Diagnosis not present

## 2020-03-16 ENCOUNTER — Other Ambulatory Visit: Payer: Self-pay

## 2020-03-16 ENCOUNTER — Ambulatory Visit (INDEPENDENT_AMBULATORY_CARE_PROVIDER_SITE_OTHER): Payer: Medicare PPO | Admitting: Nurse Practitioner

## 2020-03-16 ENCOUNTER — Encounter: Payer: Self-pay | Admitting: Nurse Practitioner

## 2020-03-16 VITALS — BP 157/84 | HR 110 | Temp 98.2°F | Ht 64.0 in | Wt 213.8 lb

## 2020-03-16 DIAGNOSIS — R05 Cough: Secondary | ICD-10-CM

## 2020-03-16 DIAGNOSIS — K219 Gastro-esophageal reflux disease without esophagitis: Secondary | ICD-10-CM | POA: Diagnosis not present

## 2020-03-16 DIAGNOSIS — R053 Chronic cough: Secondary | ICD-10-CM

## 2020-03-16 DIAGNOSIS — Z800 Family history of malignant neoplasm of digestive organs: Secondary | ICD-10-CM | POA: Diagnosis not present

## 2020-03-16 LAB — COMPLETE METABOLIC PANEL WITH GFR
AG Ratio: 1.2 (calc) (ref 1.0–2.5)
ALT: 50 U/L — ABNORMAL HIGH (ref 6–29)
AST: 38 U/L — ABNORMAL HIGH (ref 10–35)
Albumin: 4.2 g/dL (ref 3.6–5.1)
Alkaline phosphatase (APISO): 117 U/L (ref 37–153)
BUN/Creatinine Ratio: 12 (calc) (ref 6–22)
BUN: 12 mg/dL (ref 7–25)
CO2: 26 mmol/L (ref 20–32)
Calcium: 9.5 mg/dL (ref 8.6–10.4)
Chloride: 102 mmol/L (ref 98–110)
Creat: 1 mg/dL — ABNORMAL HIGH (ref 0.50–0.99)
GFR, Est African American: 68 mL/min/{1.73_m2} (ref >=60)
GFR, Est Non African American: 59 mL/min/{1.73_m2} — ABNORMAL LOW (ref >=60)
Globulin: 3.4 g/dL (calc) (ref 1.9–3.7)
Glucose, Bld: 96 mg/dL (ref 65–99)
Potassium: 4.6 mmol/L (ref 3.5–5.3)
Sodium: 138 mmol/L (ref 135–146)
Total Bilirubin: 0.5 mg/dL (ref 0.2–1.2)
Total Protein: 7.6 g/dL (ref 6.1–8.1)

## 2020-03-16 MED ORDER — PANTOPRAZOLE SODIUM 40 MG PO TBEC: DELAYED_RELEASE_TABLET | ORAL | 3 refills | Status: DC

## 2020-03-16 NOTE — Assessment & Plan Note (Signed)
Currently up-to-date on colonoscopy.  She will be due in 2024.  She is on a recall list.

## 2020-03-16 NOTE — Progress Notes (Signed)
Referring Provider: Fayrene Helper, MD Primary Care Physician:  Fayrene Helper, MD Primary GI:  Dr. Gala Romney (in the absence of Dr. Oneida Alar); pending Dr. Abbey Chatters  Chief Complaint  Patient presents with  . Gastroesophageal Reflux    HPI:   Alexis Hurst is a 69 y.o. female who presents for follow-up on GERD and cough.  Patient was last seen in our office on 09/16/2019 for the same.  Her last visit was a virtual office visit due to COVID-19/coronavirus pandemic.  Previous chronic cough worked up by pulmonary with no relief from prednisone.  Nasal sprays helped somewhat, pantoprazole did not improve her cough symptoms.  CT chest with abnormal findings including bandlike scarring in the lingula, minimal paraseptal emphysema, occasional tiny parenchymal pulmonary nodules up recommend 78-month follow-up.  Overall, pulmonary felt her cough is improved but recommended PFTs.  Colonoscopy up-to-date with recommended 10-year repeat in 2024.  Overall, she did have some improvement on PPI.  PFTs resulted with no obstructive or restrictive disease, felt to be chronic bronchitis and tiny pulmonary nodules with low risk for malignancy.  Recommended against further imaging.  She was started on a LAMA due to unable to tolerate SABA; prescription for Spiriva daily and continue albuterol  At her last visit cough is improved but still occasionally flares up.  Concerned about recent Covid exposure and was awaiting follow-up testing with initial testing negative.  Still on Protonix which she feels is helped.  Rare pill dysphagia despite half glass of water, but indicated she was amenable to drinking a full glass of water to help.  No other overt GI complaints.  Recommended continue Protonix twice daily, take pills as a full glass water, follow-up in 6 months.  Today she states she's doing ok overall. Feels she's much better than she was, still has sporadic cough that is brief. Occasionally gets choked if she  drinks liquids. Cough still interferes with singing at times, which is frustrating for her; not often. Is using inhalers prn rather than scheduled. GERD doing well on PPI. Rare breakthrough, generally just lets it pass. Denies abdominal pain, N/V, hematochezia, melena, fever, chills, unintentional weight loss. Denies URI or flu-like symptoms. Did have some allergy symptoms for a few days a couple weeks ago, now resolved. Denies loss of sense of taste or smell. The patient has received COVID-19 vaccination(s). She did have COVID in January, had minimal symptoms. Denies chest pain, dyspnea, dizziness, lightheadedness, syncope, near syncope. Denies any other upper or lower GI symptoms.  Past Medical History:  Diagnosis Date  . Allergic sinusitis 12/18/2012  . Allergic urticaria 04/02/2017  . Anxiety   . COVID-19 virus infection 09/05/2019  . Depression   . Diverticulosis 12/11/2013   Colonoscopy in 2014 per Dr Oneida Alar, also has small hemmorhoids   . Dizziness    Mild Orthostatic  . Essential hypertension   . Fatigue   . History of chest pain    Negative stress echo 2010  . Hyperlipidemia   . Insomnia   . Lab test positive for detection of COVID-19 virus 09/24/2019  . Mitral valve prolapse   . Non-allergic rhinitis 10/31/2017  . Obesity   . Paresthesias    Upper extremity /episode sensation of coldness and numbness   . Piles (hemorrhoids) 07/20/2015  . Raynauds phenomenon    Possible  . Type 2 diabetes mellitus ([place])     Past Surgical History:  Procedure Laterality Date  . ABDOMINAL HYSTERECTOMY    . BREAST SURGERY    .  CATARACT EXTRACTION Right 02/03/2020  . CATARACT EXTRACTION Left 02/09/2020  . COLONOSCOPY  06/11/2003   [facility]:multiple medium scatered diverticula in the cecum, a sending colon, transverse colon, descending colon, sigmoid colon.  . COLONOSCOPY N/A 05/20/2013   TICS, SML IH  . Cystectomy L breast, benign    . VESICOVAGINAL FISTULA CLOSURE W/ TAH  1989    Current  Outpatient Medications  Medication Sig Dispense Refill  . albuterol (VENTOLIN HFA) 108 (90 Base) MCG/ACT inhaler Inhale 2 puffs into the lungs every 6 (six) hours as needed for wheezing or shortness of breath. 8 g 6  . amLODipine (NORVASC) 5 MG tablet TAKE 1 TABLET(5 MG) BY MOUTH DAILY 90 tablet 3  . aspirin (ASPIRIN LOW DOSE) 81 MG EC tablet Take 81 mg by mouth daily.      Marland Kitchen atorvastatin (LIPITOR) 10 MG tablet Take 1 tablet (10 mg total) by mouth daily. 90 tablet 2  . azelastine (ASTELIN) 0.1 % nasal spray Place 2 sprays into both nostrils 2 (two) times daily. Use in each nostril as directed (Patient taking differently: Place 2 sprays into both nostrils as needed. Use in each nostril as directed) 30 mL 12  . Calcium Carbonate-Vitamin D (CALCIUM + D) 600-200 MG-UNIT TABS Take 1 tablet by mouth daily.     . carvedilol (COREG) 6.25 MG tablet TAKE 1 TABLET(6.25 MG) BY MOUTH TWICE DAILY WITH A MEAL 180 tablet 1  . chlorpheniramine (CHLOR-TRIMETON) 4 MG tablet Take 1 tablet (4 mg total) by mouth 2 (two) times daily as needed for allergies. 14 tablet 0  . cloNIDine (CATAPRES) 0.2 MG tablet TAKE 1 TABLET BY MOUTH EVERY DAY 90 tablet 2  . conjugated estrogens (PREMARIN) vaginal cream Place 1 Applicatorful vaginally daily. (Patient taking differently: Place 1 Applicatorful vaginally as needed. ) 42.5 g 12  . FLUoxetine (PROZAC) 40 MG capsule TAKE 1 CAPSULE(40 MG) BY MOUTH DAILY 30 capsule 5  . fluticasone (FLONASE) 50 MCG/ACT nasal spray SHAKE LIQUID AND USE 2 SPRAYS IN EACH NOSTRIL DAILY (Patient taking differently: as needed. ) 16 g 6  . glucose blood (ONE TOUCH ULTRA TEST) test strip Use as instructed once daily dx e11.9 50 each 5  . metFORMIN (GLUCOPHAGE) 1000 MG tablet TAKE 1 TABLET BY MOUTH EVERY DAY WITH BREAKFAST 90 tablet 1  . montelukast (SINGULAIR) 10 MG tablet TAKE 1 TABLET(10 MG) BY MOUTH AT BEDTIME 30 tablet 5  . pantoprazole (PROTONIX) 40 MG tablet TAKE 1 TABLET(40 MG) BY MOUTH TWICE DAILY  BEFORE A MEAL 60 tablet 5  . spironolactone (ALDACTONE) 25 MG tablet TAKE 1 TABLET(25 MG) BY MOUTH DAILY 30 tablet 3  . Tiotropium Bromide Monohydrate (SPIRIVA RESPIMAT) 1.25 MCG/ACT AERS Inhale 2 puffs into the lungs daily. (Patient taking differently: Inhale 2 puffs into the lungs as needed. ) 4 g 0  . venlafaxine XR (EFFEXOR-XR) 75 MG 24 hr capsule TAKE 1 CAPSULE(75 MG) BY MOUTH DAILY WITH BREAKFAST 90 capsule 1  . Vitamin D, Ergocalciferol, (DRISDOL) 1.25 MG (50000 UNIT) CAPS capsule TAKE 1 CAPSULE BY MOUTH ONCE A WEEK 12 capsule 1   No current facility-administered medications for this visit.    Allergies as of 03/16/2020 - Review Complete 03/16/2020  Allergen Reaction Noted  . Olmesartan Cough 02/05/2019  . Lactose intolerance (gi) Diarrhea and Nausea And Vomiting 05/07/2013    Family History  Problem Relation Age of Onset  . Hypertension Mother   . Urticaria Mother   . Heart failure Father   . Lung cancer  Father   . Heart disease Father   . Asthma Sister   . Heart disease Sister   . Hypertension Brother   . Crohn's disease Brother   . Diabetes Brother   . Hypertension Sister   . Hypertension Sister   . Colon cancer Maternal Grandfather        Age greater than 58  . Allergic rhinitis Neg Hx   . Angioedema Neg Hx   . Atopy Neg Hx   . Eczema Neg Hx   . Immunodeficiency Neg Hx     Social History   Socioeconomic History  . Marital status: Married    Spouse name: Not on file  . Number of children: 2  . Years of education: Not on file  . Highest education level: Not on file  Occupational History  . Occupation: Pharmacist, hospital   Tobacco Use  . Smoking status: Former Smoker    Packs/day: 0.50    Years: 16.00    Pack years: 8.00    Types: Cigarettes    Start date: 7    Quit date: 09/04/1986    Years since quitting: 33.5  . Smokeless tobacco: Never Used  Vaping Use  . Vaping Use: Never used  Substance and Sexual Activity  . Alcohol use: Yes    Alcohol/week: 0.0  standard drinks    Comment: occ  . Drug use: No  . Sexual activity: Not on file  Other Topics Concern  . Not on file  Social History Narrative  . Not on file   Social Determinants of Health   Financial Resource Strain: Low Risk   . Difficulty of Paying Living Expenses: Not hard at all  Food Insecurity: No Food Insecurity  . Worried About Charity fundraiser in the Last Year: Never true  . Ran Out of Food in the Last Year: Never true  Transportation Needs: No Transportation Needs  . Lack of Transportation (Medical): No  . Lack of Transportation (Non-Medical): No  Physical Activity: Inactive  . Days of Exercise per Week: 0 days  . Minutes of Exercise per Session: 0 min  Stress: No Stress Concern Present  . Feeling of Stress : Only a little  Social Connections: Socially Integrated  . Frequency of Communication with Friends and Family: More than three times a week  . Frequency of Social Gatherings with Friends and Family: More than three times a week  . Attends Religious Services: More than 4 times per year  . Active Member of Clubs or Organizations: Yes  . Attends Archivist Meetings: More than 4 times per year  . Marital Status: Married    Subjective: Review of Systems  Constitutional: Negative for chills, fever, malaise/fatigue and weight loss.  HENT: Negative for congestion and sore throat.   Respiratory: Negative for cough and shortness of breath.   Cardiovascular: Negative for chest pain and palpitations.  Gastrointestinal: Negative for abdominal pain, blood in stool, diarrhea, melena, nausea and vomiting.  Musculoskeletal: Negative for joint pain and myalgias.  Skin: Negative for rash.  Neurological: Negative for dizziness and weakness.  Endo/Heme/Allergies: Does not bruise/bleed easily.  Psychiatric/Behavioral: Negative for depression. The patient is not nervous/anxious.   All other systems reviewed and are negative.    Objective: BP (!) 157/84   Pulse  (!) 110   Temp 98.2 F (36.8 C) (Oral)   Ht 5\' 4"  (1.626 m)   Wt 213 lb 12.8 oz (97 kg)   BMI 36.70 kg/m  Physical Exam Vitals and  nursing note reviewed.  Constitutional:      General: She is not in acute distress.    Appearance: Normal appearance. She is well-developed. She is not ill-appearing, toxic-appearing or diaphoretic.  HENT:     Head: Normocephalic and atraumatic.     Nose: No congestion or rhinorrhea.  Eyes:     General: No scleral icterus. Cardiovascular:     Rate and Rhythm: Normal rate and regular rhythm.     Heart sounds: Normal heart sounds.  Pulmonary:     Effort: Pulmonary effort is normal. No respiratory distress.     Breath sounds: Normal breath sounds.  Abdominal:     General: Bowel sounds are normal.     Palpations: Abdomen is soft. There is no hepatomegaly, splenomegaly or mass.     Tenderness: There is no abdominal tenderness. There is no guarding or rebound.     Hernia: No hernia is present.  Skin:    General: Skin is warm and dry.     Coloration: Skin is not jaundiced.     Findings: No rash.  Neurological:     General: No focal deficit present.     Mental Status: She is alert and oriented to person, place, and time.  Psychiatric:        Attention and Perception: Attention normal.        Mood and Affect: Mood normal.        Speech: Speech normal.        Behavior: Behavior normal.        Thought Content: Thought content normal.        Cognition and Memory: Cognition and memory normal.       03/16/2020 2:06 PM   Disclaimer: This note was dictated with voice recognition software. Similar sounding words can inadvertently be transcribed and may not be corrected upon review.

## 2020-03-16 NOTE — Patient Instructions (Signed)
Your health issues we discussed today were:   GERD (reflux/heartburn): 1. Continue taking pantoprazole 40 mg twice daily 2. I sent a refill to your pharmacy 3. Call us if you have any worsening or severe symptoms  Chronic cough: 1. I did discuss, try taking your medications as prescribed for at least a month to see if this helps your intermittent cough 2. Follow-up with pulmonary and primary care based on their recommendations  Family history of colon cancer: 1. Your colonoscopy is up-to-date 2. Your next colonoscopy will be scheduled for 2024. 3. We will send you a letter when it is time to schedule your next colonoscopy  Overall I recommend:  1. Continue other current medications 2. Return for follow-up in 1 year 3. Call us if you have any questions or concerns   ---------------------------------------------------------------  I am glad you have gotten your COVID-19 vaccination!  Even though you are fully vaccinated you should continue to follow CDC and state/local guidelines.  ---------------------------------------------------------------   At [facility] we value your feedback. You may receive a survey about your visit today. Please share your experience as we strive to create trusting relationships with our patients to provide genuine, compassionate, quality care.  We appreciate your understanding and patience as we review any laboratory studies, imaging, and other diagnostic tests that are ordered as we care for you. Our office policy is 5 business days for review of these results, and any emergent or urgent results are addressed in a timely manner for your best interest. If you do not hear from our office in 1 week, please contact us.   We also encourage the use of MyChart, which contains your medical information for your review as well. If you are not enrolled in this feature, an access code is on this after visit summary for your convenience. Thank you for  allowing [place] to be involved in your care.  It was great to see you today!  I hope you have a great Summer!!

## 2020-03-16 NOTE — Assessment & Plan Note (Signed)
Chronic cough is improved, she does have intermittent/rare breakthrough coughing.  She is taking her inhalers "as needed" I recommended she take them as prescribed for at least a month and see if this helps with the occasional/intermittent coughing.  She is agreeable and will try this.  Continue other medications otherwise and follow-up in 1 year.  Follow-up with pulmonary based on their recommendations.

## 2020-03-16 NOTE — Progress Notes (Signed)
Cc'ed to pcp °

## 2020-03-16 NOTE — Assessment & Plan Note (Signed)
GERD symptoms doing well, remains on Protonix twice a day which she feels has made a significant improvement in her symptoms.  At this point I will have her continue her meds.  I will have her follow-up in 1 year as she is doing well.  I have refilled her Protonix today.  If she is still doing well in 1 year, would consider dialing back her dose and/or have her follow-up as needed.

## 2020-03-16 NOTE — Assessment & Plan Note (Deleted)
GERD symptoms doing well, remains on Protonix twice a day which she feels has made a significant improvement in her symptoms.  At this point I will have her continue her meds.  I will have her follow-up in 1 year as she is doing well.  I have refilled her Protonix today.  If she is still doing well in 1 year, would consider dialing back her dose and/or have her follow-up as needed.

## 2020-03-18 ENCOUNTER — Telehealth: Payer: Self-pay | Admitting: Family Medicine

## 2020-03-18 NOTE — Telephone Encounter (Signed)
Pt returning call about blood work 

## 2020-03-19 NOTE — Telephone Encounter (Signed)
Pt advised of lab results with verbal understanding  

## 2020-04-05 DIAGNOSIS — H35363 Drusen (degenerative) of macula, bilateral: Secondary | ICD-10-CM | POA: Diagnosis not present

## 2020-04-11 ENCOUNTER — Other Ambulatory Visit: Payer: Self-pay | Admitting: Family Medicine

## 2020-04-12 DIAGNOSIS — Z03818 Encounter for observation for suspected exposure to other biological agents ruled out: Secondary | ICD-10-CM | POA: Diagnosis not present

## 2020-04-13 ENCOUNTER — Other Ambulatory Visit: Payer: Self-pay

## 2020-04-13 ENCOUNTER — Ambulatory Visit (INDEPENDENT_AMBULATORY_CARE_PROVIDER_SITE_OTHER): Payer: Medicare PPO | Admitting: Family Medicine

## 2020-04-13 ENCOUNTER — Encounter: Payer: Self-pay | Admitting: Family Medicine

## 2020-04-13 VITALS — BP 128/78 | HR 82 | Ht 64.0 in | Wt 218.0 lb

## 2020-04-13 DIAGNOSIS — R2231 Localized swelling, mass and lump, right upper limb: Secondary | ICD-10-CM | POA: Diagnosis not present

## 2020-04-13 DIAGNOSIS — I10 Essential (primary) hypertension: Secondary | ICD-10-CM | POA: Diagnosis not present

## 2020-04-13 DIAGNOSIS — E785 Hyperlipidemia, unspecified: Secondary | ICD-10-CM | POA: Diagnosis not present

## 2020-04-13 DIAGNOSIS — E559 Vitamin D deficiency, unspecified: Secondary | ICD-10-CM | POA: Diagnosis not present

## 2020-04-13 DIAGNOSIS — F418 Other specified anxiety disorders: Secondary | ICD-10-CM | POA: Diagnosis not present

## 2020-04-13 NOTE — Patient Instructions (Addendum)
Annual physical exam in office with MD in 8 weeks, call if you need me sooner. , MMSE at visit please  Please schedule mammogram at checkout Blood pressure is excellent no change in medication.  Fasting lipid, cmp and eGFr, TSH and vit D 1 week before next visit  You are referred to general surgery re cyst on right inner arm  It is important that you exercise regularly at least 30 minutes 5 times a week. If you develop chest pain, have severe difficulty breathing, or feel very tired, stop exercising immediately and seek medical attention   Think about what you will eat, plan ahead. Choose " clean, green, fresh or frozen" over canned, processed or packaged foods which are more sugary, salty and fatty. 70 to 75% of food eaten should be vegetables and fruit. Three meals at set times with snacks allowed between meals, but they must be fruit or vegetables. Aim to eat over a 12 hour period , example 7 am to 7 pm, and STOP after  your last meal of the day. Drink water,generally about 64 ounces per day, no other drink is as healthy. Fruit juice is best enjoyed in a healthy way, by EATING the fruit. Thanks for choosing [facility], we consider it a privelige to serve you.

## 2020-04-14 ENCOUNTER — Other Ambulatory Visit: Payer: Self-pay | Admitting: Chiropractic Medicine

## 2020-04-15 ENCOUNTER — Other Ambulatory Visit: Payer: Self-pay | Admitting: Family Medicine

## 2020-04-15 DIAGNOSIS — Z1231 Encounter for screening mammogram for malignant neoplasm of breast: Secondary | ICD-10-CM

## 2020-04-18 ENCOUNTER — Encounter: Payer: Self-pay | Admitting: Family Medicine

## 2020-04-18 DIAGNOSIS — R2231 Localized swelling, mass and lump, right upper limb: Secondary | ICD-10-CM | POA: Insufficient documentation

## 2020-04-18 NOTE — Assessment & Plan Note (Signed)
Controlled, no change in medication  

## 2020-04-18 NOTE — Assessment & Plan Note (Signed)
Painless nodule on right arm medial aspect refer gen surg

## 2020-04-18 NOTE — Assessment & Plan Note (Signed)
Controlled, no change in medication DASH diet and commitment to daily physical activity for a minimum of 30 minutes discussed and encouraged, as a part of hypertension management. The importance of attaining a healthy weight is also discussed.  BP/Weight 04/13/2020 03/16/2020 02/12/2020 01/23/2020 12/31/2019 09/24/2019 29/93/7169  Systolic BP 678 938 101 751 025 852 778  Diastolic BP 78 84 85 77 88 80 72  Wt. (Lbs) 218 213.8 217.12 218.4 218.4 220 215  BMI 37.42 36.7 37.27 37.49 37.49 40.24 36.9

## 2020-04-18 NOTE — Assessment & Plan Note (Signed)
Obesity associated with hypertension and depression  Patient re-educated about  the importance of commitment to a  minimum of 150 minutes of exercise per week as able.  The importance of healthy food choices with portion control discussed, as well as eating regularly and within a 12 hour window most days. The need to choose "clean , green" food 50 to 75% of the time is discussed, as well as to make water the primary drink and set a goal of 64 ounces water daily.    Weight /BMI 04/13/2020 03/16/2020 02/12/2020  WEIGHT 218 lb 213 lb 12.8 oz 217 lb 1.9 oz  HEIGHT 5\' 4"  5\' 4"  5\' 4"   BMI 37.42 kg/m2 36.7 kg/m2 37.27 kg/m2

## 2020-04-18 NOTE — Assessment & Plan Note (Signed)
Hyperlipidemia:Low fat diet discussed and encouraged.   Lipid Panel  Lab Results  Component Value Date   CHOL 158 01/30/2020   HDL 71 01/30/2020   LDLCALC 71 01/30/2020   TRIG 79 01/30/2020   CHOLHDL 2.2 01/30/2020  Controlled, no change in medication Updated lab needed at/ before next visit.

## 2020-04-18 NOTE — Progress Notes (Signed)
Alexis Hurst     MRN: [id number]      DOB: [birth year]   HPI Alexis Hurst is here for follow up and re-evaluation of chronic medical conditions, medication management and review of any available recent lab and radiology data.  Preventive health is updated, specifically  Cancer screening and Immunization.   Questions or concerns regarding consultations or procedures which the PT has had in the interim are  addressed. The PT denies any adverse reactions to current medications since the last visit.  C/o painless cyst on right arm, inner aspect  ROS Denies recent fever or chills. Denies sinus pressure, nasal congestion, ear pain or sore throat. Denies chest congestion, productive cough or wheezing. Denies chest pains, palpitations and leg swelling Denies abdominal pain, nausea, vomiting,diarrhea or constipation.   Denies dysuria, frequency, hesitancy or incontinence. Denies joint pain, swelling and limitation in mobility. Denies headaches, seizures, numbness, or tingling. Denies depression, anxiety or insomnia. Denies skin break down or rash.   PE  BP 128/78   Pulse 82   Ht 5\' 4"  (1.626 m)   Wt 218 lb (98.9 kg)   BMI 37.42 kg/m   Patient alert and oriented and in no cardiopulmonary distress.  HEENT: No facial asymmetry, EOMI,     Neck supple .  Chest: Clear to auscultation bilaterally.  CVS: S1, S2 no murmurs, no S3.Regular rate.  ABD: Soft non tender.   Ext: No edema  MS: Adequate ROM spine, shoulders, hips and knees.  Skin: Intact, no ulcerations or rash noted.  Psych: Good eye contact, normal affect. Memory intact not anxious or depressed appearing.  CNS: CN 2-12 intact, power,  normal throughout.no focal deficits noted.   Assessment & Plan  Essential hypertension Controlled, no change in medication DASH diet and commitment to daily physical activity for a minimum of 30 minutes discussed and encouraged, as a part of hypertension management. The importance  of attaining a healthy weight is also discussed.  BP/Weight 04/13/2020 03/16/2020 02/12/2020 01/23/2020 12/31/2019 09/24/2019 16/09/930  Systolic BP 355 732 202 542 706 237 628  Diastolic BP 78 84 85 77 88 80 72  Wt. (Lbs) 218 213.8 217.12 218.4 218.4 220 215  BMI 37.42 36.7 37.27 37.49 37.49 40.24 36.9       Depression with anxiety Controlled, no change in medication   Morbid obesity ([place]) Obesity associated with hypertension and depression  Patient re-educated about  the importance of commitment to a  minimum of 150 minutes of exercise per week as able.  The importance of healthy food choices with portion control discussed, as well as eating regularly and within a 12 hour window most days. The need to choose "clean , green" food 50 to 75% of the time is discussed, as well as to make water the primary drink and set a goal of 64 ounces water daily.    Weight /BMI 04/13/2020 03/16/2020 02/12/2020  WEIGHT 218 lb 213 lb 12.8 oz 217 lb 1.9 oz  HEIGHT 5\' 4"  5\' 4"  5\' 4"   BMI 37.42 kg/m2 36.7 kg/m2 37.27 kg/m2      Hyperlipidemia LDL goal <100 Hyperlipidemia:Low fat diet discussed and encouraged.   Lipid Panel  Lab Results  Component Value Date   CHOL 158 01/30/2020   HDL 71 01/30/2020   LDLCALC 71 01/30/2020   TRIG 79 01/30/2020   CHOLHDL 2.2 01/30/2020  Controlled, no change in medication Updated lab needed at/ before next visit.      Subcutaneous nodule of right upper  extremity Painless nodule on right arm medial aspect refer gen surg

## 2020-04-28 ENCOUNTER — Other Ambulatory Visit: Payer: Self-pay | Admitting: Family Medicine

## 2020-05-03 ENCOUNTER — Ambulatory Visit: Payer: Medicare PPO | Admitting: Family Medicine

## 2020-05-04 ENCOUNTER — Other Ambulatory Visit: Payer: Self-pay

## 2020-05-04 ENCOUNTER — Encounter: Payer: Self-pay | Admitting: General Surgery

## 2020-05-04 ENCOUNTER — Ambulatory Visit (INDEPENDENT_AMBULATORY_CARE_PROVIDER_SITE_OTHER): Payer: Medicare PPO | Admitting: General Surgery

## 2020-05-04 VITALS — BP 132/72 | HR 108 | Temp 98.5°F | Resp 16 | Ht 64.0 in | Wt 215.0 lb

## 2020-05-04 DIAGNOSIS — L723 Sebaceous cyst: Secondary | ICD-10-CM | POA: Diagnosis not present

## 2020-05-04 NOTE — Patient Instructions (Signed)
Epidermal (Sebaceous) Cyst  An epidermal cyst is a small, painless lump under your skin. The cyst contains a grayish-white, bad-smelling substance (keratin). Do not try to pop or open an epidermal cyst yourself. What are the causes?  A blocked hair follicle.  A hair that curls and re-enters the skin instead of growing straight out of the skin.  A blocked pore.  Irritated skin.  An injury to the skin.  Certain conditions that are passed along from parent to child (inherited).  Human papillomavirus (HPV).  Long-term sun damage to the skin. What increases the risk?  Having acne.  Being overweight.  Being 67-38 years old. What are the signs or symptoms? These cysts are usually harmless, but they can get infected. Symptoms of infection may include:  Redness.  Inflammation.  Tenderness.  Warmth.  Fever.  A grayish-white, bad-smelling substance drains from the cyst.  Pus drains from the cyst. How is this treated? In many cases, epidermal cysts go away on their own without treatment. If a cyst becomes infected, treatment may include:  Opening and draining the cyst, done by a doctor. After draining, you may need minor surgery to remove the rest of the cyst.  Antibiotic medicine.  Shots of medicines (steroids) that help to reduce inflammation.  Surgery to remove the cyst. Surgery may be done if the cyst: ? Becomes large. ? Bothers you. ? Has a chance of turning into cancer.  Do not try to open a cyst yourself. Follow these instructions at home:  Take over-the-counter and prescription medicines only as told by your doctor.  If you were prescribed an antibiotic medicine, take it it as told by your doctor. Do not stop using the antibiotic even if you start to feel better.  Keep the area around your cyst clean and dry.  Wear loose, dry clothing.  Avoid touching your cyst.  Check your cyst every day for signs of infection. Check for: ? Redness, swelling, or  pain. ? Fluid or blood. ? Warmth. ? Pus or a bad smell.  Keep all follow-up visits as told by your doctor. This is important. How is this prevented?  Wear clean, dry, clothing.  Avoid wearing tight clothing.  Keep your skin clean and dry. Take showers or baths every day. Contact a doctor if:  Your cyst has symptoms of infection.  Your condition does not improve or gets worse.  You have a cyst that looks different from other cysts you have had.  You have a fever. Get help right away if:  Redness spreads from the cyst into the area close by. Summary  An epidermal cyst is a sac made of skin tissue.  If a cyst becomes infected, treatment may include surgery to open and drain the cyst, or to remove it.  Take over-the-counter and prescription medicines only as told by your doctor.  Contact a doctor if your condition is not improving or is getting worse.  Keep all follow-up visits as told by your doctor. This is important. This information is not intended to replace advice given to you by your health care provider. Make sure you discuss any questions you have with your health care provider. Document Revised: 12/12/2018 Document Reviewed: 05/30/2018 Elsevier Patient Education  2020 [employer].

## 2020-05-04 NOTE — Progress Notes (Signed)
[facility] History and Physical  Reason for Referral: Right axilla cyst  Referring Physician:  Fayrene Helper, MD  Chief Complaint    New Patient (Initial Visit)      Alexis Hurst is a 69 y.o. female.  HPI: Alexis Hurst is a sweet 69 yo well known to me as she has accompanied her husband and brother to clinic for various things. She reports having a new lump in her axilla for the past month that came up suddenly and she was able to express white discharge. The area is about the size of a pea now and is not causing her any discomfort. She has not had any issues with it getting infected or swollen to date. She is coming to have this evaluated and make sure nothing more serious is going on.  She has completed her mammogram annual and had a negative mammogram 06/2019 and is due this year.    Past Medical History:  Diagnosis Date  . Allergic sinusitis 12/18/2012  . Allergic urticaria 04/02/2017  . Anxiety   . COVID-19 virus infection 09/05/2019  . Depression   . Diverticulosis 12/11/2013   Colonoscopy in 2014 per Dr Oneida Alar, also has small hemmorhoids   . Dizziness    Mild Orthostatic  . Essential hypertension   . Fatigue   . History of chest pain    Negative stress echo 2010  . Hyperlipidemia   . Insomnia   . Lab test positive for detection of COVID-19 virus 09/24/2019  . Mitral valve prolapse   . Non-allergic rhinitis 10/31/2017  . Obesity   . Paresthesias    Upper extremity /episode sensation of coldness and numbness   . Piles (hemorrhoids) 07/20/2015  . Raynauds phenomenon    Possible  . Type 2 diabetes mellitus ([place])     Past Surgical History:  Procedure Laterality Date  . ABDOMINAL HYSTERECTOMY    . BREAST SURGERY    . CATARACT EXTRACTION Right 02/03/2020  . CATARACT EXTRACTION Left 02/09/2020  . COLONOSCOPY  06/11/2003   Smith:multiple medium scatered diverticula in the cecum, a sending colon, transverse colon, descending colon, sigmoid colon.    . COLONOSCOPY N/A 05/20/2013   TICS, SML IH  . Cystectomy L breast, benign    . VESICOVAGINAL FISTULA CLOSURE W/ TAH  1989    Family History  Problem Relation Age of Onset  . Hypertension Mother   . Urticaria Mother   . Heart failure Father   . Lung cancer Father   . Heart disease Father   . Asthma Sister   . Heart disease Sister   . Hypertension Brother   . Crohn's disease Brother   . Diabetes Brother   . Hypertension Sister   . Hypertension Sister   . Colon cancer Maternal Grandfather        Age greater than 66  . Allergic rhinitis Neg Hx   . Angioedema Neg Hx   . Atopy Neg Hx   . Eczema Neg Hx   . Immunodeficiency Neg Hx     Social History   Tobacco Use  . Smoking status: Former Smoker    Packs/day: 0.50    Years: 16.00    Pack years: 8.00    Types: Cigarettes    Start date: 77    Quit date: 09/04/1986    Years since quitting: 33.6  . Smokeless tobacco: Never Used  Vaping Use  . Vaping Use: Never used  Substance Use Topics  . Alcohol use: Yes  Alcohol/week: 0.0 standard drinks    Comment: occ  . Drug use: No    Medications: I have reviewed the patient's current medications. Allergies as of 05/04/2020      Reactions   Olmesartan Cough   Lactose Intolerance (gi) Diarrhea, Nausea And Vomiting      Medication List       Accurate as of May 04, 2020 10:41 AM. If you have any questions, ask your nurse or doctor.        albuterol 108 (90 Base) MCG/ACT inhaler Commonly known as: VENTOLIN HFA Inhale 2 puffs into the lungs every 6 (six) hours as needed for wheezing or shortness of breath.   amLODipine 5 MG tablet Commonly known as: NORVASC TAKE 1 TABLET(5 MG) BY MOUTH DAILY   Aspirin Low Dose 81 MG EC tablet Generic drug: aspirin Take 81 mg by mouth daily.   atorvastatin 10 MG tablet Commonly known as: LIPITOR Take 1 tablet (10 mg total) by mouth daily.   azelastine 0.1 % nasal spray Commonly known as: ASTELIN Place 2 sprays into both  nostrils 2 (two) times daily. Use in each nostril as directed What changed:   when to take this  reasons to take this   Calcium Carbonate-Vitamin D 600-200 MG-UNIT Tabs Take 1 tablet by mouth daily.   carvedilol 6.25 MG tablet Commonly known as: COREG TAKE 1 TABLET(6.25 MG) BY MOUTH TWICE DAILY WITH A MEAL   chlorpheniramine 4 MG tablet Commonly known as: CHLOR-TRIMETON Take 1 tablet (4 mg total) by mouth 2 (two) times daily as needed for allergies.   cloNIDine 0.2 MG tablet Commonly known as: CATAPRES TAKE 1 TABLET BY MOUTH EVERY DAY   conjugated estrogens vaginal cream Commonly known as: PREMARIN Place 1 Applicatorful vaginally daily. What changed:   when to take this  reasons to take this   FLUoxetine 40 MG capsule Commonly known as: PROZAC TAKE 1 CAPSULE(40 MG) BY MOUTH DAILY   fluticasone 50 MCG/ACT nasal spray Commonly known as: FLONASE SHAKE LIQUID AND USE 2 SPRAYS IN EACH NOSTRIL DAILY What changed: See the new instructions.   glucose blood test strip Commonly known as: ONE TOUCH ULTRA TEST Use as instructed once daily dx e11.9   metFORMIN 1000 MG tablet Commonly known as: GLUCOPHAGE TAKE 1 TABLET BY MOUTH EVERY DAY WITH BREAKFAST   montelukast 10 MG tablet Commonly known as: SINGULAIR TAKE 1 TABLET(10 MG) BY MOUTH AT BEDTIME   pantoprazole 40 MG tablet Commonly known as: PROTONIX TAKE 1 TABLET(40 MG) BY MOUTH TWICE DAILY BEFORE A MEAL   Spiriva Respimat 1.25 MCG/ACT Aers Generic drug: Tiotropium Bromide Monohydrate Inhale 2 puffs into the lungs daily. What changed:   when to take this  reasons to take this   spironolactone 25 MG tablet Commonly known as: ALDACTONE TAKE 1 TABLET(25 MG) BY MOUTH DAILY   venlafaxine XR 75 MG 24 hr capsule Commonly known as: EFFEXOR-XR TAKE 1 CAPSULE(75 MG) BY MOUTH DAILY WITH BREAKFAST   Vitamin D (Ergocalciferol) 1.25 MG (50000 UNIT) Caps capsule Commonly known as: DRISDOL TAKE 1 CAPSULE BY MOUTH ONCE  A WEEK        ROS:  A comprehensive review of systems was negative except for: Ears, nose, mouth, throat, and face: positive for sinus issues Respiratory: positive for SOB with some physical activity  Blood pressure 132/72, pulse (!) 108, temperature 98.5 F (36.9 C), temperature source Oral, resp. rate 16, height 5\' 4"  (1.626 m), weight 215 lb (97.5 kg), SpO2 96 %.  Physical Exam Vitals reviewed.  Constitutional:      Appearance: Normal appearance.  HENT:     Head: Normocephalic.  Eyes:     Extraocular Movements: Extraocular movements intact.     Pupils: Pupils are equal, round, and reactive to light.  Cardiovascular:     Rate and Rhythm: Normal rate and regular rhythm.  Pulmonary:     Effort: Pulmonary effort is normal.     Breath sounds: Normal breath sounds.  Abdominal:     General: There is no distension.     Palpations: Abdomen is soft.  Musculoskeletal:        General: No swelling. Normal range of motion.     Cervical back: Normal range of motion. No rigidity.  Lymphadenopathy:     Upper Body:     Right upper body: No axillary adenopathy.     Left upper body: No axillary adenopathy.     Comments: Right axilla superiorly there is a small pea size area that is indurated, no erythema or drainage, small opening noted  Skin:    General: Skin is warm and dry.  Neurological:     General: No focal deficit present.     Mental Status: She is alert and oriented to person, place, and time.  Psychiatric:        Mood and Affect: Mood normal.        Behavior: Behavior normal.        Thought Content: Thought content normal.        Judgment: Judgment normal.     Results: None  Assessment & Plan:  Alexis Hurst is a 69 y.o. female with a small sebaceous cyst that she was able to express keratin from but is not currently causing any issues. Discussed option of in office removal with lidocaine if it is causing her issues or she is worried about it. Discussed watch and  waiting.   -She is going to hold on any procedure at this time and will keep an eye on it -PRN follow up  All questions were answered to the satisfaction of the patient.   Alexis Hurst 05/04/2020, 10:41 AM

## 2020-06-01 ENCOUNTER — Other Ambulatory Visit: Payer: Self-pay | Admitting: Family Medicine

## 2020-06-01 DIAGNOSIS — E785 Hyperlipidemia, unspecified: Secondary | ICD-10-CM

## 2020-06-15 ENCOUNTER — Other Ambulatory Visit: Payer: Self-pay

## 2020-06-15 ENCOUNTER — Encounter: Payer: Self-pay | Admitting: Family Medicine

## 2020-06-15 ENCOUNTER — Ambulatory Visit (INDEPENDENT_AMBULATORY_CARE_PROVIDER_SITE_OTHER): Payer: Medicare PPO | Admitting: Family Medicine

## 2020-06-15 VITALS — BP 131/75 | HR 100 | Temp 98.7°F | Resp 20 | Ht 64.0 in | Wt 217.0 lb

## 2020-06-15 DIAGNOSIS — Z23 Encounter for immunization: Secondary | ICD-10-CM | POA: Diagnosis not present

## 2020-06-15 DIAGNOSIS — E559 Vitamin D deficiency, unspecified: Secondary | ICD-10-CM | POA: Diagnosis not present

## 2020-06-15 DIAGNOSIS — R7303 Prediabetes: Secondary | ICD-10-CM | POA: Diagnosis not present

## 2020-06-15 DIAGNOSIS — F418 Other specified anxiety disorders: Secondary | ICD-10-CM

## 2020-06-15 DIAGNOSIS — N898 Other specified noninflammatory disorders of vagina: Secondary | ICD-10-CM | POA: Diagnosis not present

## 2020-06-15 DIAGNOSIS — I10 Essential (primary) hypertension: Secondary | ICD-10-CM | POA: Diagnosis not present

## 2020-06-15 DIAGNOSIS — R002 Palpitations: Secondary | ICD-10-CM

## 2020-06-15 DIAGNOSIS — E785 Hyperlipidemia, unspecified: Secondary | ICD-10-CM | POA: Diagnosis not present

## 2020-06-15 NOTE — Patient Instructions (Addendum)
Annual physical exam in office with mD in 4 to  5 weeks, call if you need me sooner  Flu vaccine today  Stop metformin  Labs today, cmp and EGFr, HSV2, vit D, tSH and hBA1C  Will contact you with results before any new prescriptions.  Holding on Gyne eval until results are obtained  It is important that you exercise regularly at least 30 minutes 5 times a week. If you develop chest pain, have severe difficulty breathing, or feel very tired, stop exercising immediately and seek medical attention  Think about what you will eat, plan ahead. Choose " clean, green, fresh or frozen" over canned, processed or packaged foods which are more sugary, salty and fatty. 70 to 75% of food eaten should be vegetables and fruit. Three meals at set times with snacks allowed between meals, but they must be fruit or vegetables. Aim to eat over a 12 hour period , example 7 am to 7 pm, and STOP after  your last meal of the day. Drink water,generally about 64 ounces per day, no other drink is as healthy. Fruit juice is best enjoyed in a healthy way, by EATING the fruit. Thanks for choosing [facility], we consider it a privelige to serve you.

## 2020-06-15 NOTE — Progress Notes (Signed)
Alexis Hurst     MRN: [id number]      DOB: [birth year]   HPI Alexis Hurst is here for follow up and re-evaluation of chronic medical conditions, medication management and review of any available recent lab and radiology data.  Preventive health is updated, specifically  Cancer screening and Immunization.   Questions or concerns regarding consultations or procedures which the PT has had in the interim are  addressed. The PT denies any adverse reactions to current medications since the last visit.  3 month h/o penireal itch, skin change , no discharge , dryness, monistat and premarin hav e not helped   ROS Denies recent fever or chills. Denies sinus pressure, nasal congestion, ear pain or sore throat. Denies chest congestion, productive cough or wheezing. Denies chest pains, palpitations and leg swelling Denies abdominal pain, nausea, vomiting,diarrhea or constipation.   Denies dysuria, frequency, hesitancy or incontinence. Denies joint pain, swelling and limitation in mobility. Denies headaches, seizures, numbness, or tingling. Denies uncontrolled  depression, anxiety or insomnia. Denies skin break down or rash.   PE  BP 131/75   Pulse 100   Temp 98.7 F (37.1 C)   Resp 20   Ht 5\' 4"  (1.626 m)   Wt 217 lb (98.4 kg)   SpO2 95%   BMI 37.25 kg/m   Patient alert and oriented and in no cardiopulmonary distress.  HEENT: No facial asymmetry, EOMI,     Neck supple .  Chest: Clear to auscultation bilaterally.  CVS: S1, S2 no murmurs, no S3.Regular rate.  ABD: Soft non tender.  GU: External genitalia , no ulcers , skin breakdown or discharge Internal" vaginal walls pink and moist, no ulcers,no discharge No cervical motion or adnexal tenderness Ext: No edema  MS: Adequate ROM spine, shoulders, hips and knees.  Skin: Intact, no ulcerations or rash noted.  Psych: Good eye contact, normal affect. Memory intact not anxious or depressed appearing.  CNS: CN 2-12 intact,  power,  normal throughout.no focal deficits noted.   Assessment & Plan  Vaginal itching 3 month history intermittent, check HSV treat if positiv and review, if negative/ or itching persits , refer Gyne  Prediabetes Patient educated about the importance of limiting  Carbohydrate intake , the need to commit to daily physical activity for a minimum of 30 minutes , and to commit weight loss. The fact that changes in all these areas will reduce or eliminate all together the development of diabetes is stressed.   Diabetic Labs Latest Ref Rng & Units 06/15/2020 03/15/2020 01/30/2020 01/23/2020 01/23/2020  HbA1c 4.8 - 5.6 % 6.3(H) - - 5.8 5.8  Chol <200 mg/dL - - 158 - -  HDL > OR = 50 mg/dL - - 71 - -  Calc LDL mg/dL (calc) - - 71 - -  Triglycerides <150 mg/dL - - 79 - -  Creatinine 0.57 - 1.00 mg/dL 1.03(H) 1.00(H) 0.85 - -   BP/Weight 06/15/2020 05/04/2020 04/13/2020 03/16/2020 02/12/2020 01/23/2020 04/30/785  Systolic BP 754 492 010 071 219 758 832  Diastolic BP 75 72 78 84 85 77 88  Wt. (Lbs) 217 215 218 213.8 217.12 218.4 218.4  BMI 37.25 36.9 37.42 36.7 37.27 37.49 37.49   Foot/eye exam completion dates Latest Ref Rng & Units 01/19/2020 11/18/2019  Eye Exam No Retinopathy Retinopathy(A) No Retinopathy  Foot Form Completion - - -    Discontinue metformin  Essential hypertension Controlled, no change in medication DASH diet and commitment to daily physical activity for  a minimum of 30 minutes discussed and encouraged, as a part of hypertension management. The importance of attaining a healthy weight is also discussed.  BP/Weight 06/15/2020 05/04/2020 04/13/2020 03/16/2020 02/12/2020 01/23/2020 8/41/2820  Systolic BP 813 887 195 974 718 550 158  Diastolic BP 75 72 78 84 85 77 88  Wt. (Lbs) 217 215 218 213.8 217.12 218.4 218.4  BMI 37.25 36.9 37.42 36.7 37.27 37.49 37.49       Morbid obesity ([facility]) Obesity linked with hypertension and depression  Patient re-educated about  the importance  of commitment to a  minimum of 150 minutes of exercise per week as able.  The importance of healthy food choices with portion control discussed, as well as eating regularly and within a 12 hour window most days. The need to choose "clean , green" food 50 to 75% of the time is discussed, as well as to make water the primary drink and set a goal of 64 ounces water daily.    Weight /BMI 06/15/2020 05/04/2020 04/13/2020  WEIGHT 217 lb 215 lb 218 lb  HEIGHT 5\' 4"  5\' 4"  5\' 4"   BMI 37.25 kg/m2 36.9 kg/m2 37.42 kg/m2

## 2020-06-17 LAB — CMP14+EGFR
ALT: 37 IU/L — ABNORMAL HIGH (ref 0–32)
AST: 28 IU/L (ref 0–40)
Albumin/Globulin Ratio: 1.4 (ref 1.2–2.2)
Albumin: 4.2 g/dL (ref 3.8–4.8)
Alkaline Phosphatase: 110 IU/L (ref 44–121)
BUN/Creatinine Ratio: 14 (ref 12–28)
BUN: 14 mg/dL (ref 8–27)
Bilirubin Total: <0.2 mg/dL (ref 0.0–1.2)
CO2: 24 mmol/L (ref 20–29)
Calcium: 9 mg/dL (ref 8.7–10.3)
Chloride: 104 mmol/L (ref 96–106)
Creatinine, Ser: 1.03 mg/dL — ABNORMAL HIGH (ref 0.57–1.00)
GFR calc Af Amer: 65 mL/min/{1.73_m2} (ref >59)
GFR calc non Af Amer: 56 mL/min/{1.73_m2} — ABNORMAL LOW (ref >59)
Globulin, Total: 2.9 g/dL (ref 1.5–4.5)
Glucose: 85 mg/dL (ref 65–99)
Potassium: 4 mmol/L (ref 3.5–5.2)
Sodium: 142 mmol/L (ref 134–144)
Total Protein: 7.1 g/dL (ref 6.0–8.5)

## 2020-06-17 LAB — TSH: TSH: 1.39 u[IU]/mL (ref 0.450–4.500)

## 2020-06-17 LAB — HSV(HERPES SIMPLEX VRS) I + II AB-IGG
HSV 1 Glycoprotein G Ab, IgG: 25.4 index — ABNORMAL HIGH (ref 0.00–0.90)
HSV 2 IgG, Type Spec: <0.91 index (ref 0.00–0.90)

## 2020-06-17 LAB — HEMOGLOBIN A1C
Est. average glucose Bld gHb Est-mCnc: 134 mg/dL
Hgb A1c MFr Bld: 6.3 % — ABNORMAL HIGH (ref 4.8–5.6)

## 2020-06-17 LAB — VITAMIN D 25 HYDROXY (VIT D DEFICIENCY, FRACTURES): Vit D, 25-Hydroxy: 33.3 ng/mL (ref 30.0–100.0)

## 2020-06-18 ENCOUNTER — Other Ambulatory Visit: Payer: Self-pay

## 2020-06-18 ENCOUNTER — Other Ambulatory Visit: Payer: Self-pay | Admitting: Family Medicine

## 2020-06-18 MED ORDER — ACYCLOVIR 800 MG PO TABS
800.0000 mg | ORAL_TABLET | Freq: Every day | ORAL | 0 refills | Status: DC
Start: 2020-06-18 — End: 2020-06-23

## 2020-06-18 NOTE — Progress Notes (Signed)
Rx verbally called into [facility]

## 2020-06-18 NOTE — Progress Notes (Unsigned)
Medication called in 

## 2020-06-18 NOTE — Progress Notes (Signed)
Acyclovir 

## 2020-06-20 ENCOUNTER — Encounter: Payer: Self-pay | Admitting: Family Medicine

## 2020-06-20 DIAGNOSIS — E119 Type 2 diabetes mellitus without complications: Secondary | ICD-10-CM | POA: Insufficient documentation

## 2020-06-20 NOTE — Assessment & Plan Note (Signed)
Controlled, no change in medication DASH diet and commitment to daily physical activity for a minimum of 30 minutes discussed and encouraged, as a part of hypertension management. The importance of attaining a healthy weight is also discussed.  BP/Weight 06/15/2020 05/04/2020 04/13/2020 03/16/2020 02/12/2020 01/23/2020 12/11/8284  Systolic BP 751 982 429 980 699 967 227  Diastolic BP 75 72 78 84 85 77 88  Wt. (Lbs) 217 215 218 213.8 217.12 218.4 218.4  BMI 37.25 36.9 37.42 36.7 37.27 37.49 37.49

## 2020-06-20 NOTE — Assessment & Plan Note (Signed)
Patient educated about the importance of limiting  Carbohydrate intake , the need to commit to daily physical activity for a minimum of 30 minutes , and to commit weight loss. The fact that changes in all these areas will reduce or eliminate all together the development of diabetes is stressed.   Diabetic Labs Latest Ref Rng & Units 06/15/2020 03/15/2020 01/30/2020 01/23/2020 01/23/2020  HbA1c 4.8 - 5.6 % 6.3(H) - - 5.8 5.8  Chol <200 mg/dL - - 158 - -  HDL > OR = 50 mg/dL - - 71 - -  Calc LDL mg/dL (calc) - - 71 - -  Triglycerides <150 mg/dL - - 79 - -  Creatinine 0.57 - 1.00 mg/dL 1.03(H) 1.00(H) 0.85 - -   BP/Weight 06/15/2020 05/04/2020 04/13/2020 03/16/2020 02/12/2020 01/23/2020 3/33/8329  Systolic BP 191 660 600 459 977 414 239  Diastolic BP 75 72 78 84 85 77 88  Wt. (Lbs) 217 215 218 213.8 217.12 218.4 218.4  BMI 37.25 36.9 37.42 36.7 37.27 37.49 37.49   Foot/eye exam completion dates Latest Ref Rng & Units 01/19/2020 11/18/2019  Eye Exam No Retinopathy Retinopathy(A) No Retinopathy  Foot Form Completion - - -    Discontinue metformin

## 2020-06-20 NOTE — Assessment & Plan Note (Signed)
Obesity linked with hypertension and depression  Patient re-educated about  the importance of commitment to a  minimum of 150 minutes of exercise per week as able.  The importance of healthy food choices with portion control discussed, as well as eating regularly and within a 12 hour window most days. The need to choose "clean , green" food 50 to 75% of the time is discussed, as well as to make water the primary drink and set a goal of 64 ounces water daily.    Weight /BMI 06/15/2020 05/04/2020 04/13/2020  WEIGHT 217 lb 215 lb 218 lb  HEIGHT 5\' 4"  5\' 4"  5\' 4"   BMI 37.25 kg/m2 36.9 kg/m2 37.42 kg/m2

## 2020-06-20 NOTE — Assessment & Plan Note (Signed)
3 month history intermittent, check HSV treat if positiv and review, if negative/ or itching persits , refer Gyne

## 2020-06-22 ENCOUNTER — Other Ambulatory Visit: Payer: Self-pay

## 2020-06-22 ENCOUNTER — Telehealth (INDEPENDENT_AMBULATORY_CARE_PROVIDER_SITE_OTHER): Payer: Medicare PPO

## 2020-06-22 VITALS — BP 131/75 | HR 100 | Temp 98.7°F | Resp 20 | Ht 64.0 in | Wt 217.0 lb

## 2020-06-22 DIAGNOSIS — Z0000 Encounter for general adult medical examination without abnormal findings: Secondary | ICD-10-CM | POA: Diagnosis not present

## 2020-06-22 NOTE — Progress Notes (Addendum)
Subjective:   Alexis Hurst is a 69 y.o. female who presents for Medicare Annual (Subsequent) preventive examination.    Objective:    There were no vitals filed for this visit. There is no height or weight on file to calculate BMI.  Advanced Directives 09/03/2015 05/20/2013  Does Patient Have a Medical Advance Directive? No Patient does not have advance directive;Patient would not like information  Would patient like information on creating a medical advance directive? No - patient declined information -  Pre-existing out of facility DNR order (yellow form or pink MOST form) - No    Current Medications (verified) Outpatient Encounter Medications as of 06/22/2020  Medication Sig  . acyclovir (ZOVIRAX) 800 MG tablet Take 1 tablet (800 mg total) by mouth 5 (five) times daily for 10 days.  Marland Kitchen albuterol (VENTOLIN HFA) 108 (90 Base) MCG/ACT inhaler Inhale 2 puffs into the lungs every 6 (six) hours as needed for wheezing or shortness of breath.  Marland Kitchen amLODipine (NORVASC) 5 MG tablet TAKE 1 TABLET(5 MG) BY MOUTH DAILY  . aspirin (ASPIRIN LOW DOSE) 81 MG EC tablet Take 81 mg by mouth daily.    Marland Kitchen azelastine (ASTELIN) 0.1 % nasal spray Place 2 sprays into both nostrils 2 (two) times daily. Use in each nostril as directed (Patient taking differently: Place 2 sprays into both nostrils as needed. Use in each nostril as directed)  . Calcium Carbonate-Vitamin D (CALCIUM + D) 600-200 MG-UNIT TABS Take 1 tablet by mouth daily.   . carvedilol (COREG) 6.25 MG tablet TAKE 1 TABLET(6.25 MG) BY MOUTH TWICE DAILY WITH A MEAL  . chlorpheniramine (CHLOR-TRIMETON) 4 MG tablet Take 1 tablet (4 mg total) by mouth 2 (two) times daily as needed for allergies.  . cloNIDine (CATAPRES) 0.2 MG tablet TAKE 1 TABLET BY MOUTH EVERY DAY  . conjugated estrogens (PREMARIN) vaginal cream Place 1 Applicatorful vaginally daily. (Patient taking differently: Place 1 Applicatorful vaginally as needed. )  . FLUoxetine (PROZAC) 40  MG capsule TAKE 1 CAPSULE(40 MG) BY MOUTH DAILY  . fluticasone (FLONASE) 50 MCG/ACT nasal spray SHAKE LIQUID AND USE 2 SPRAYS IN EACH NOSTRIL DAILY (Patient taking differently: as needed. )  . glucose blood (ONE TOUCH ULTRA TEST) test strip Use as instructed once daily dx e11.9  . montelukast (SINGULAIR) 10 MG tablet TAKE 1 TABLET(10 MG) BY MOUTH AT BEDTIME  . pantoprazole (PROTONIX) 40 MG tablet TAKE 1 TABLET(40 MG) BY MOUTH TWICE DAILY BEFORE A MEAL  . spironolactone (ALDACTONE) 25 MG tablet TAKE 1 TABLET(25 MG) BY MOUTH DAILY  . Tiotropium Bromide Monohydrate (SPIRIVA RESPIMAT) 1.25 MCG/ACT AERS Inhale 2 puffs into the lungs daily. (Patient taking differently: Inhale 2 puffs into the lungs as needed. )  . venlafaxine XR (EFFEXOR-XR) 75 MG 24 hr capsule TAKE 1 CAPSULE(75 MG) BY MOUTH DAILY WITH BREAKFAST  . Vitamin D, Ergocalciferol, (DRISDOL) 1.25 MG (50000 UNIT) CAPS capsule TAKE 1 CAPSULE BY MOUTH ONCE A WEEK   No facility-administered encounter medications on file as of 06/22/2020.    Allergies (verified) Olmesartan and Lactose intolerance (gi)   History: Past Medical History:  Diagnosis Date  . Allergic sinusitis 12/18/2012  . Allergic urticaria 04/02/2017  . Anxiety   . COVID-19 virus infection 09/05/2019  . Depression   . Diverticulosis 12/11/2013   Colonoscopy in 2014 per Dr Oneida Alar, also has small hemmorhoids   . Dizziness    Mild Orthostatic  . Essential hypertension   . Fatigue   . History of chest  pain    Negative stress echo 2010  . Hyperlipidemia   . Insomnia   . Lab test positive for detection of COVID-19 virus 09/24/2019  . Mitral valve prolapse   . Non-allergic rhinitis 10/31/2017  . Obesity   . Paresthesias    Upper extremity /episode sensation of coldness and numbness   . Piles (hemorrhoids) 07/20/2015  . Raynauds phenomenon    Possible  . Type 2 diabetes mellitus ([place])    Past Surgical History:  Procedure Laterality Date  . ABDOMINAL HYSTERECTOMY    .  BREAST SURGERY    . CATARACT EXTRACTION Right 02/03/2020  . CATARACT EXTRACTION Left 02/09/2020  . COLONOSCOPY  06/11/2003   Smith:multiple medium scatered diverticula in the cecum, a sending colon, transverse colon, descending colon, sigmoid colon.  . COLONOSCOPY N/A 05/20/2013   TICS, SML IH  . Cystectomy L breast, benign    . VESICOVAGINAL FISTULA CLOSURE W/ TAH  1989   Family History  Problem Relation Age of Onset  . Hypertension Mother   . Urticaria Mother   . Heart failure Father   . Lung cancer Father   . Heart disease Father   . Asthma Sister   . Heart disease Sister   . Hypertension Brother   . Crohn's disease Brother   . Diabetes Brother   . Hypertension Sister   . Hypertension Sister   . Colon cancer Maternal Grandfather        Age greater than 73  . Allergic rhinitis Neg Hx   . Angioedema Neg Hx   . Atopy Neg Hx   . Eczema Neg Hx   . Immunodeficiency Neg Hx    Social History   Socioeconomic History  . Marital status: Married    Spouse name: Not on file  . Number of children: 2  . Years of education: Not on file  . Highest education level: Not on file  Occupational History  . Occupation: Pharmacist, hospital   Tobacco Use  . Smoking status: Former Smoker    Packs/day: 0.50    Years: 16.00    Pack years: 8.00    Types: Cigarettes    Start date: 23    Quit date: 09/04/1986    Years since quitting: 33.8  . Smokeless tobacco: Never Used  Vaping Use  . Vaping Use: Never used  Substance and Sexual Activity  . Alcohol use: Yes    Alcohol/week: 0.0 standard drinks    Comment: occ  . Drug use: No  . Sexual activity: Not on file  Other Topics Concern  . Not on file  Social History Narrative  . Not on file   Social Determinants of Health   Financial Resource Strain:   . Difficulty of Paying Living Expenses: Not on file  Food Insecurity:   . Worried About Charity fundraiser in the Last Year: Not on file  . Ran Out of Food in the Last Year: Not on file    Transportation Needs:   . Lack of Transportation (Medical): Not on file  . Lack of Transportation (Non-Medical): Not on file  Physical Activity:   . Days of Exercise per Week: Not on file  . Minutes of Exercise per Session: Not on file  Stress:   . Feeling of Stress : Not on file  Social Connections:   . Frequency of Communication with Friends and Family: Not on file  . Frequency of Social Gatherings with Friends and Family: Not on file  . Attends Religious Services:  Not on file  . Active Member of Clubs or Organizations: Not on file  . Attends Archivist Meetings: Not on file  . Marital Status: Not on file    Tobacco Counseling Counseling given: Not Answered   Diabetic? No      Activities of Daily Living No flowsheet data found.  Patient Care Team: Fayrene Helper, MD as PCP - [facility], Marga Melnick, MD (Inactive) as Consulting Physician (Gastroenterology)  Indicate any recent Medical Services you may have received from other than [facility] providers in the past year (date may be approximate).     Assessment:   This is a routine wellness examination for Alexis Hurst.  Hearing/Vision screen No exam data present  Dietary issues and exercise activities discussed:    Goals   None    Depression Screen PHQ 2/9 Scores 06/15/2020 04/13/2020 02/12/2020 01/23/2020 12/31/2019 09/24/2019 06/19/2019  PHQ - 2 Score 1 - 0 0 2 0 1  PHQ- 9 Score - - 0 0 4 4 -  Exception Documentation - Patient refusal - - - - -    Fall Risk Fall Risk  06/15/2020 05/04/2020 04/13/2020 02/12/2020 01/23/2020  Falls in the past year? 0 0 0 0 0  Number falls in past yr: 0 - 0 0 0  Injury with Fall? 0 - 0 0 0  Risk for fall due to : - - - No Fall Risks No Fall Risks  Follow up Falls evaluation completed Falls evaluation completed - Falls evaluation completed Falls evaluation completed     Any stairs in or around the home? Yes  If so, are there any without handrails? Yes  Home free of loose  throw rugs in walkways, pet beds, electrical cords, etc? Yes  Adequate lighting in your home to reduce risk of falls? Yes   ASSISTIVE DEVICES UTILIZED TO PREVENT FALLS:  Life alert? No  Use of a cane, walker or w/c? No  Grab bars in the bathroom? No  Shower chair or bench in shower? No  Elevated toilet seat or a handicapped toilet? Yes   TIMED UP AND GO:  Was the test performed? No .   Gait steady and fast without use of assistive device  Cognitive Function:     6CIT Screen 06/19/2019  What Year? 0 points  What month? 0 points  What time? 0 points  Count back from 20 0 points  Months in reverse 0 points  Repeat phrase 0 points  Total Score 0    Immunizations Immunization History  Administered Date(s) Administered  . Fluad Quad(high Dose 65+) 04/28/2019, 06/15/2020  . Influenza Split 05/23/2011, 05/16/2012  . Influenza Whole 06/21/2008, 06/08/2009, 05/13/2010  . Influenza, High Dose Seasonal PF 06/17/2018  . Influenza,inj,Quad PF,6+ Mos 06/04/2013, 07/21/2014, 07/20/2015, 05/23/2016, 08/06/2017  . PFIZER SARS-COV-2 Vaccination 12/04/2019, 12/26/2019  . Pneumococcal Conjugate-13 04/20/2014  . Pneumococcal Polysaccharide-23 06/17/2018  . Td 03/26/2007  . Tdap 08/06/2017  . Zoster 08/05/2013    TDAP status: Up to date Flu Vaccine status: Up to date Pneumococcal vaccine status: Up to date Covid-19 vaccine status: Completed vaccines  Qualifies for Shingles Vaccine? Yes   Zostavax completed No   Shingrix Completed?: No.    Education has been provided regarding the importance of this vaccine. Patient has been advised to call insurance company to determine out of pocket expense if they have not yet received this vaccine. Advised may also receive vaccine at local pharmacy or Health Dept. Verbalized acceptance and understanding.  Screening Tests Health  Maintenance  Topic Date Due  . MAMMOGRAM  06/29/2021  . COLONOSCOPY  05/21/2023  . TETANUS/TDAP  08/07/2027  .  INFLUENZA VACCINE  Completed  . DEXA SCAN  Completed  . COVID-19 Vaccine  Completed  . Hepatitis C Screening  Completed  . PNA vac Low Risk Adult  Completed    Health Maintenance  There are no preventive care reminders to display for this patient.  Colorectal cancer screening: Completed normal. Repeat every 10 years Mammogram status: Completed due this month . Repeat every year Bone Density status: Completed normal. Results reflect: Bone density results: NORMAL. Repeat every 5 years.  Lung Cancer Screening: (Low Dose CT Chest recommended if Age 69-80 years, 30 pack-year currently smoking OR have quit w/in 15years.) does not qualify.    Additional Screening:  Hepatitis C Screening: does qualify; Completed   Vision Screening: Recommended annual ophthalmology exams for early detection of glaucoma and other disorders of the eye. Is the patient up to date with their annual eye exam?  Yes    Who is the provider or what is the name of the office in which the patient attends annual eye exams? [facility]  If pt is not established with a provider, would they like to be referred to a provider to establish care? No .   Dental Screening: Recommended annual dental exams for proper oral hygiene  Community Resource Referral / Chronic Care Management: CRR required this visit?  No   CCM required this visit?  No      Plan:     I have personally reviewed and noted the following in the patient's chart:   . Medical and social history . Use of alcohol, tobacco or illicit drugs  . Current medications and supplements . Functional ability and status . Nutritional status . Physical activity . Advanced directives . List of other physicians . Hospitalizations, surgeries, and ER visits in previous 12 months . Vitals . Screenings to include cognitive, depression, and falls . Referrals and appointments  In addition, I have reviewed and discussed with patient certain preventive  protocols, quality metrics, and best practice recommendations. A written personalized care plan for preventive services as well as general preventive health recommendations were provided to patient.     Lonn Georgia, LPN   [id number]   Nurse Notes: AWV conducted over the phone with pt at the home and the provider present in the office.

## 2020-06-23 ENCOUNTER — Other Ambulatory Visit: Payer: Self-pay

## 2020-06-23 MED ORDER — ACYCLOVIR 800 MG PO TABS
800.0000 mg | ORAL_TABLET | Freq: Every day | ORAL | 0 refills | Status: AC
Start: 2020-06-23 — End: 2020-07-03

## 2020-06-28 ENCOUNTER — Other Ambulatory Visit: Payer: Self-pay | Admitting: Family Medicine

## 2020-07-02 ENCOUNTER — Ambulatory Visit
Admission: RE | Admit: 2020-07-02 | Discharge: 2020-07-02 | Disposition: A | Discharge status: Home / Self Care | Payer: Medicare PPO | Source: Ambulatory Visit | Attending: Family Medicine | Admitting: Family Medicine

## 2020-07-02 ENCOUNTER — Other Ambulatory Visit: Payer: Self-pay

## 2020-07-02 DIAGNOSIS — Z1231 Encounter for screening mammogram for malignant neoplasm of breast: Secondary | ICD-10-CM | POA: Diagnosis not present

## 2020-07-08 DIAGNOSIS — H04123 Dry eye syndrome of bilateral lacrimal glands: Secondary | ICD-10-CM | POA: Diagnosis not present

## 2020-07-08 DIAGNOSIS — H1045 Other chronic allergic conjunctivitis: Secondary | ICD-10-CM | POA: Diagnosis not present

## 2020-07-09 ENCOUNTER — Other Ambulatory Visit: Payer: Self-pay | Admitting: Family Medicine

## 2020-07-09 DIAGNOSIS — R928 Other abnormal and inconclusive findings on diagnostic imaging of breast: Secondary | ICD-10-CM

## 2020-07-22 ENCOUNTER — Ambulatory Visit
Admission: RE | Admit: 2020-07-22 | Discharge: 2020-07-22 | Disposition: A | Discharge status: Home / Self Care | Payer: Medicare PPO | Source: Ambulatory Visit | Attending: Family Medicine | Admitting: Family Medicine

## 2020-07-22 ENCOUNTER — Other Ambulatory Visit: Payer: Self-pay

## 2020-07-22 DIAGNOSIS — N6011 Diffuse cystic mastopathy of right breast: Secondary | ICD-10-CM | POA: Diagnosis not present

## 2020-07-22 DIAGNOSIS — R928 Other abnormal and inconclusive findings on diagnostic imaging of breast: Secondary | ICD-10-CM

## 2020-07-23 ENCOUNTER — Telehealth: Payer: Self-pay | Admitting: Family Medicine

## 2020-07-23 MED ORDER — ACYCLOVIR 800 MG PO TABS: 800.0000 mg | ORAL_TABLET | Freq: Three times a day (TID) | ORAL | 0 refills | Status: DC

## 2020-07-23 NOTE — Telephone Encounter (Signed)
Reports vaginal itch has improved but not completely cleared. 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 I will refill and if still a problem after this 2nd course , she will be referred to [facility]  Please call and let her l know tha tI have reconsidered the decision to refer to Gyne at this time and have sent in a repeat dose of the tablets.   Please advise her to call if after the 2nd dose she still has symptoms,  Thank you !

## 2020-07-25 ENCOUNTER — Other Ambulatory Visit: Payer: Self-pay | Admitting: Family Medicine

## 2020-07-26 ENCOUNTER — Telehealth: Payer: Self-pay | Admitting: Family Medicine

## 2020-07-26 ENCOUNTER — Other Ambulatory Visit: Payer: Self-pay

## 2020-07-26 MED ORDER — SPIRONOLACTONE 25 MG PO TABS
ORAL_TABLET | ORAL | 3 refills | Status: DC
Start: 2020-07-26 — End: 2021-07-07

## 2020-07-26 NOTE — Telephone Encounter (Signed)
Patient needing a refill on spironolactone sent to [facility]

## 2020-07-26 NOTE — Telephone Encounter (Signed)
Pt informed, verbalizes understanding.

## 2020-07-26 NOTE — Telephone Encounter (Signed)
Refill sent.

## 2020-08-17 ENCOUNTER — Ambulatory Visit (INDEPENDENT_AMBULATORY_CARE_PROVIDER_SITE_OTHER): Payer: Medicare PPO | Admitting: Family Medicine

## 2020-08-17 ENCOUNTER — Other Ambulatory Visit: Payer: Self-pay

## 2020-08-17 ENCOUNTER — Encounter: Payer: Self-pay | Admitting: Family Medicine

## 2020-08-17 VITALS — BP 150/84 | HR 100 | Resp 16 | Ht 64.0 in | Wt 216.0 lb

## 2020-08-17 DIAGNOSIS — F5104 Psychophysiologic insomnia: Secondary | ICD-10-CM

## 2020-08-17 DIAGNOSIS — R0789 Other chest pain: Secondary | ICD-10-CM | POA: Diagnosis not present

## 2020-08-17 DIAGNOSIS — T733XXS Exhaustion due to excessive exertion, sequela: Secondary | ICD-10-CM | POA: Diagnosis not present

## 2020-08-17 DIAGNOSIS — R002 Palpitations: Secondary | ICD-10-CM

## 2020-08-17 DIAGNOSIS — E785 Hyperlipidemia, unspecified: Secondary | ICD-10-CM

## 2020-08-17 DIAGNOSIS — N898 Other specified noninflammatory disorders of vagina: Secondary | ICD-10-CM | POA: Diagnosis not present

## 2020-08-17 DIAGNOSIS — I10 Essential (primary) hypertension: Secondary | ICD-10-CM

## 2020-08-17 DIAGNOSIS — R079 Chest pain, unspecified: Secondary | ICD-10-CM

## 2020-08-17 DIAGNOSIS — T733XXD Exhaustion due to excessive exertion, subsequent encounter: Secondary | ICD-10-CM

## 2020-08-17 DIAGNOSIS — Z0001 Encounter for general adult medical examination with abnormal findings: Secondary | ICD-10-CM

## 2020-08-17 DIAGNOSIS — F5101 Primary insomnia: Secondary | ICD-10-CM

## 2020-08-17 DIAGNOSIS — R7303 Prediabetes: Secondary | ICD-10-CM

## 2020-08-17 MED ORDER — AMLODIPINE BESYLATE 10 MG PO TABS: ORAL_TABLET | ORAL | 3 refills | Status: DC

## 2020-08-17 MED ORDER — TRAZODONE HCL 50 MG PO TABS: 25.0000 mg | ORAL_TABLET | Freq: Every evening | ORAL | 1 refills | Status: DC | PRN

## 2020-08-17 MED ORDER — NYSTATIN-TRIAMCINOLONE 100000-0.1 UNIT/GM-% EX OINT: TOPICAL_OINTMENT | CUTANEOUS | 0 refills | Status: DC

## 2020-08-17 NOTE — Progress Notes (Signed)
Alexis Hurst     MRN: [id number]      DOB: Jun 11, 1951  HPI: Patient is in for annual physical exam. Concerns about heart wants referral to Cardiology again, has had 2 episodes of extreme exhaustion 45 mins in October and Novemebr had to sit and rest and brokke out in sweat , 2 episodes in past 2 months Intermittent chest discomfort one in October and Novemberand exertional fatigue x 6 months C/o poor sleep, both falling and staying asleep Still c/o itching in perineum and groin and vaginal, needs gyne eval, improved but not totally gone Recent labs,  are reviewed. Immunization is reviewed ,  PE: BP (!) 150/84   Pulse 100   Resp 16   Ht 5\' 4"  (1.626 m)   Wt 216 lb 0.6 oz (98 kg)   SpO2 100%   BMI 37.08 kg/m   Pleasant  female, alert and oriented x 3, in no cardio-pulmonary distress. Afebrile. HEENT No facial trauma or asymetry. Sinuses non tender.  Extra occullar muscles intact.. External ears normal, . Neck: supple, no adenopathy,JVD or thyromegaly.No bruits.  Chest: Clear to ascultation bilaterally.No crackles or wheezes. Non tender to palpation  Breast: Normal mammogram in 07/2020, asymptomatic,no physical exam   Cardiovascular system; Heart sounds normal,  S1 and  S2 ,no S3.  No murmur, or thrill. Apical beat not displaced Peripheral pulses normal. EKG: Sinus tachycardia, no ischemia , no LVH  Abdomen: Soft, non tender,   :  Musculoskeletal exam: Full ROM of spine, hips , shoulders and knees. No deformity ,swelling or crepitus noted. No muscle wasting or atrophy.   Neurologic: Cranial nerves 2 to 12 intact. Power, tone ,sensation and reflexes normal throughout. No disturbance in gait. No tremor.  Skin: Intact, no ulceration, erythema , scaling or rash noted. Pigmentation normal throughout  Psych; Normal mood and affect. Judgement and concentration normal   Assessment & Plan:  Encounter for annual general medical examination with abnormal  findings in adult Annual exam as documented. Counseling done  re healthy lifestyle involving commitment to 150 minutes exercise per week, heart healthy diet, and attaining healthy weight.The importance of adequate sleep also discussed. Regular seat belt use and home safety, is also discussed. Changes in health habits are decided on by the patient with goals and time frames  set for achieving them. Immunization and cancer screening needs are specifically addressed at this visit.   Essential hypertension Uncontrolled , increase amlodipine to 10 mg daily DASH diet and commitment to daily physical activity for a minimum of 30 minutes discussed and encouraged, as a part of hypertension management. The importance of attaining a healthy weight is also discussed.  BP/Weight 08/17/2020 06/22/2020 06/15/2020 05/04/2020 04/13/2020 03/16/2020 09/05/5850  Systolic BP 778 242 353 614 431 540 086  Diastolic BP 84 75 75 72 78 84 85  Wt. (Lbs) 216.04 217 217 215 218 213.8 217.12  BMI 37.08 37.25 37.25 36.9 37.42 36.7 37.27       Chest pain Intermittent x 6 months, also noted exertional fatigue EKG: NSR, no ischemia, no lVH Refer to cardiology due to newly reported concerns,   Fatigue due to excessive exertion Reports 2 episodes in past 2 months, requiring time out, not excessive exertion, accompanied by diaphoresis, refer to Cardiology  Vaginal itching perisitent x over 6 months, gyne to eval and manage, little response to acyclovir and topical estrogen  Insomnia Uncontrolled, start trazodone Sleep hygiene reviewed and written information offered also. Prescription sent for  medication  needed.

## 2020-08-17 NOTE — Patient Instructions (Addendum)
F/u in office with MD first week in March, call if you need me sooner  EKG today and you are referred to Cardiology  Blood pressure is high, new higher dose of AMLODIPINE is 10 mg one daily  New for sleep Is trazodone   practice good sleep hygiene also please  You are referred to gynecology  Fasting lipid panel, cmp andEGFr and hBA1c 3 to 5 days before next visit  Thanks for choosing [facility], we consider it a privelige to serve you.   Best for 2022!

## 2020-08-20 ENCOUNTER — Encounter: Payer: Self-pay | Admitting: Family Medicine

## 2020-08-20 DIAGNOSIS — T733XXA Exhaustion due to excessive exertion, initial encounter: Secondary | ICD-10-CM | POA: Insufficient documentation

## 2020-08-20 NOTE — Assessment & Plan Note (Signed)
Reports 2 episodes in past 2 months, requiring time out, not excessive exertion, accompanied by diaphoresis, refer to Cardiology

## 2020-08-20 NOTE — Assessment & Plan Note (Signed)

## 2020-08-20 NOTE — Assessment & Plan Note (Signed)
perisitent x over 6 months, gyne to eval and manage, little response to acyclovir and topical estrogen

## 2020-08-20 NOTE — Assessment & Plan Note (Signed)
Uncontrolled , increase amlodipine to 10 mg daily DASH diet and commitment to daily physical activity for a minimum of 30 minutes discussed and encouraged, as a part of hypertension management. The importance of attaining a healthy weight is also discussed.  BP/Weight 08/17/2020 06/22/2020 06/15/2020 05/04/2020 04/13/2020 03/16/2020 03/12/6437  Systolic BP 381 840 375 436 [phone]  Diastolic BP 84 75 75 72 78 84 85  Wt. (Lbs) 216.04 217 217 215 218 213.8 217.12  BMI 37.08 37.25 37.25 36.9 37.42 36.7 37.27

## 2020-08-20 NOTE — Assessment & Plan Note (Signed)
Intermittent x 6 months, also noted exertional fatigue EKG: NSR, no ischemia, no lVH Refer to cardiology due to newly reported concerns,

## 2020-08-20 NOTE — Assessment & Plan Note (Signed)
Uncontrolled, start trazodone Sleep hygiene reviewed and written information offered also. Prescription sent for  medication needed.

## 2020-08-22 ENCOUNTER — Other Ambulatory Visit: Payer: Self-pay | Admitting: Family Medicine

## 2020-08-30 ENCOUNTER — Other Ambulatory Visit: Payer: Self-pay | Admitting: Family Medicine

## 2020-09-01 ENCOUNTER — Ambulatory Visit (INDEPENDENT_AMBULATORY_CARE_PROVIDER_SITE_OTHER): Payer: Medicare PPO | Admitting: Student

## 2020-09-01 ENCOUNTER — Encounter: Payer: Self-pay | Admitting: Student

## 2020-09-01 ENCOUNTER — Other Ambulatory Visit: Payer: Self-pay

## 2020-09-01 VITALS — BP 132/78 | HR 116 | Ht 64.0 in | Wt 214.0 lb

## 2020-09-01 DIAGNOSIS — E782 Mixed hyperlipidemia: Secondary | ICD-10-CM

## 2020-09-01 DIAGNOSIS — E119 Type 2 diabetes mellitus without complications: Secondary | ICD-10-CM

## 2020-09-01 DIAGNOSIS — I10 Essential (primary) hypertension: Secondary | ICD-10-CM

## 2020-09-01 DIAGNOSIS — R55 Syncope and collapse: Secondary | ICD-10-CM | POA: Diagnosis not present

## 2020-09-01 NOTE — Patient Instructions (Signed)
Medication Instructions:  Your physician recommends that you continue on your current medications as directed. Please refer to the Current Medication list given to you today.  *If you need a refill on your cardiac medications before your next appointment, please call your pharmacy*   Lab Work: None Today If you have labs (blood work) drawn today and your tests are completely normal, you will receive your results only by: Marland Kitchen MyChart Message (if you have MyChart) OR . A paper copy in the mail If you have any lab test that is abnormal or we need to change your treatment, we will call you to review the results.   Testing/Procedures: None Today   Follow-Up: At [facility], you and your health needs are our priority.  As part of our continuing mission to provide you with exceptional heart care, we have created designated Provider Care Teams.  These Care Teams include your primary Cardiologist (physician) and Advanced Practice Providers (APPs -  Physician Assistants and Nurse Practitioners) who all work together to provide you with the care you need, when you need it.  We recommend signing up for the patient portal called "MyChart".  Sign up information is provided on this After Visit Summary.  MyChart is used to connect with patients for Virtual Visits (Telemedicine).  Patients are able to view lab/test results, encounter notes, upcoming appointments, etc.  Non-urgent messages can be sent to your provider as well.   To learn more about what you can do with MyChart, go to [email or web address].    Your next appointment:   2 month(s)  The format for your next appointment:   In Person  Provider:   You may see Nona Dell, MD or one of the following Advanced Practice Providers on your designated Care Team:    Randall An, [place]     Other Instructions ZIO Monitor Placement 09/02/2020

## 2020-09-01 NOTE — Progress Notes (Signed)
Cardiology Office Note    Date:  09/01/2020   ID:  Jinan, Biggins June 25, 1951, MRN [id number]  PCP:  Kerri Perches, MD  Cardiologist: Nona Dell, MD    Chief Complaint  Patient presents with  . Follow-up    Presyncope    History of Present Illness:    Oswin Johal is a 69 y.o. female with past medical history of HTN, HLD and Type 2 DM who presents to the office today for evaluation of pre-syncoal episodes.   She most recently had a telehealth visit with Dr. Diona Browner in 01/2019 and reported having progressive dyspnea on exertion for the past few months. An echocardiogram and stress test were recommended for further evaluation. Her stress test showed a small mild intensity, partially reversible apical anterior defect that was consistent with breast attenuation but no ischemia and was overall a low-risk study. Her echo showed a preserved EF of 60-65% with no regional WMA. Was informed to follow-up with Cardiology as needed.   She was evaluated by her PCP earlier this month and reported two episodes of significant fatigue within the past 2 months and was referred back to Cardiology. EKG at that time showed sinus tachycardia, HR 101 with no acute ST changes when compared to prior tracings.   In talking with the patient today, she reports having two distinct episodes of acute dizziness, fatigue and diaphoresis which occurred in October or November. The episodes lasted for approximately less than 20 minutes and spontaneously resolved. She denies any associated chest pain or palpitations at that time.Says her HR is typically elevated into the low-100's and has been this way for years. Unaware of any precipitating factors. Says she did feel like she was going to lose consciousness but did not.  She has baseline dyspnea on exertion but feels like this is possibly secondary due to inactivity and symptoms have been stable. She is planning to exercise more frequently. No  recent orthopnea, PND or pitting edema.   Past Medical History:  Diagnosis Date  . Allergic sinusitis 12/18/2012  . Allergic urticaria 04/02/2017  . Anxiety   . COVID-19 virus infection 09/05/2019  . Depression   . Diverticulosis 12/11/2013   Colonoscopy in 2014 per Dr Darrick Penna, also has small hemmorhoids   . Dizziness    Mild Orthostatic  . Essential hypertension   . Fatigue   . History of chest pain    Negative stress echo 2010  . Hyperlipidemia   . Insomnia   . Lab test positive for detection of COVID-19 virus 09/24/2019  . Mitral valve prolapse   . Non-allergic rhinitis 10/31/2017  . Obesity   . Paresthesias    Upper extremity /episode sensation of coldness and numbness   . Piles (hemorrhoids) 07/20/2015  . Raynauds phenomenon    Possible  . Type 2 diabetes mellitus (HCC)     Past Surgical History:  Procedure Laterality Date  . ABDOMINAL HYSTERECTOMY    . BREAST EXCISIONAL BIOPSY Left   . BREAST SURGERY    . CATARACT EXTRACTION Right 02/03/2020  . CATARACT EXTRACTION Left 02/09/2020  . COLONOSCOPY  06/11/2003   Smith:multiple medium scatered diverticula in the cecum, a sending colon, transverse colon, descending colon, sigmoid colon.  . COLONOSCOPY N/A 05/20/2013   TICS, SML IH  . Cystectomy L breast, benign    . VESICOVAGINAL FISTULA CLOSURE W/ TAH  1989    Current Medications: Outpatient Medications Prior to Visit  Medication Sig Dispense Refill  . albuterol (VENTOLIN  HFA) 108 (90 Base) MCG/ACT inhaler Inhale 2 puffs into the lungs every 6 (six) hours as needed for wheezing or shortness of breath. 8 g 6  . amLODipine (NORVASC) 10 MG tablet Take one tablet by mouth once daily for blood pressure 30 tablet 3  . aspirin 81 MG EC tablet Take 81 mg by mouth daily.    Marland Kitchen azelastine (ASTELIN) 0.1 % nasal spray Place 2 sprays into both nostrils 2 (two) times daily. Use in each nostril as directed (Patient taking differently: Place 2 sprays into both nostrils as needed. Use in  each nostril as directed) 30 mL 12  . Calcium Carbonate-Vitamin D 600-200 MG-UNIT TABS Take 1 tablet by mouth daily.    . carvedilol (COREG) 6.25 MG tablet TAKE 1 TABLET(6.25 MG) BY MOUTH TWICE DAILY WITH A MEAL 180 tablet 1  . cloNIDine (CATAPRES) 0.2 MG tablet TAKE 1 TABLET BY MOUTH EVERY DAY 90 tablet 2  . conjugated estrogens (PREMARIN) vaginal cream Place 1 Applicatorful vaginally daily. (Patient taking differently: Place 1 Applicatorful vaginally as needed.) 42.5 g 12  . FLUoxetine (PROZAC) 40 MG capsule TAKE 1 CAPSULE(40 MG) BY MOUTH DAILY 30 capsule 5  . fluticasone (FLONASE) 50 MCG/ACT nasal spray SHAKE LIQUID AND USE 2 SPRAYS IN EACH NOSTRIL DAILY (Patient taking differently: as needed.) 16 g 6  . glucose blood (ONE TOUCH ULTRA TEST) test strip Use as instructed once daily dx e11.9 50 each 5  . montelukast (SINGULAIR) 10 MG tablet TAKE 1 TABLET(10 MG) BY MOUTH AT BEDTIME 90 tablet 0  . nystatin-triamcinolone ointment (MYCOLOG) Apply to affected area twice daily for 1 week, then as needed 60 g 0  . pantoprazole (PROTONIX) 40 MG tablet TAKE 1 TABLET(40 MG) BY MOUTH TWICE DAILY BEFORE A MEAL 180 tablet 3  . spironolactone (ALDACTONE) 25 MG tablet TAKE 1 TABLET(25 MG) BY MOUTH DAILY 90 tablet 3  . Tiotropium Bromide Monohydrate (SPIRIVA RESPIMAT) 1.25 MCG/ACT AERS Inhale 2 puffs into the lungs daily. (Patient taking differently: Inhale 2 puffs into the lungs as needed.) 4 g 0  . traZODone (DESYREL) 50 MG tablet Take 0.5-1 tablets (25-50 mg total) by mouth at bedtime as needed for sleep. 90 tablet 1  . venlafaxine XR (EFFEXOR-XR) 75 MG 24 hr capsule TAKE 1 CAPSULE(75 MG) BY MOUTH DAILY WITH BREAKFAST 90 capsule 1  . Vitamin D, Ergocalciferol, (DRISDOL) 1.25 MG (50000 UNIT) CAPS capsule TAKE 1 CAPSULE BY MOUTH ONCE A WEEK 12 capsule 1  . acyclovir (ZOVIRAX) 800 MG tablet Take 1 tablet (800 mg total) by mouth 3 (three) times daily. 30 tablet 0  . chlorpheniramine (CHLOR-TRIMETON) 4 MG tablet Take  1 tablet (4 mg total) by mouth 2 (two) times daily as needed for allergies. 14 tablet 0   No facility-administered medications prior to visit.     Allergies:   Olmesartan and Lactose intolerance (gi)   Social History   Socioeconomic History  . Marital status: Married    Spouse name: Not on file  . Number of children: 2  . Years of education: Not on file  . Highest education level: Not on file  Occupational History  . Occupation: Pharmacist, hospital   Tobacco Use  . Smoking status: Former Smoker    Packs/day: 0.50    Years: 16.00    Pack years: 8.00    Types: Cigarettes    Start date: 83    Quit date: 09/04/1986    Years since quitting: 34.0  . Smokeless tobacco: Never Used  Vaping Use  . Vaping Use: Never used  Substance and Sexual Activity  . Alcohol use: Yes    Alcohol/week: 0.0 standard drinks    Comment: occ  . Drug use: No  . Sexual activity: Not on file  Other Topics Concern  . Not on file  Social History Narrative  . Not on file   Social Determinants of Health   Financial Resource Strain: Low Risk   . Difficulty of Paying Living Expenses: Not hard at all  Food Insecurity: No Food Insecurity  . Worried About Charity fundraiser in the Last Year: Never true  . Ran Out of Food in the Last Year: Never true  Transportation Needs: No Transportation Needs  . Lack of Transportation (Medical): No  . Lack of Transportation (Non-Medical): No  Physical Activity: Insufficiently Active  . Days of Exercise per Week: 5 days  . Minutes of Exercise per Session: 10 min  Stress: No Stress Concern Present  . Feeling of Stress : Not at all  Social Connections: Socially Integrated  . Frequency of Communication with Friends and Family: More than three times a week  . Frequency of Social Gatherings with Friends and Family: Once a week  . Attends Religious Services: More than 4 times per year  . Active Member of Clubs or Organizations: Yes  . Attends Archivist Meetings: 1 to 4  times per year  . Marital Status: Married     Family History:  The patient's family history includes Asthma in her sister; Breast cancer in her daughter; Colon cancer in her maternal grandfather; Crohn's disease in her brother; Diabetes in her brother; Heart disease in her father and sister; Heart failure in her father; Hypertension in her brother, mother, sister, and sister; Lung cancer in her father; Urticaria in her mother   Review of Systems:   Please see the history of present illness.     General:  No chills, fever, night sweats or weight changes. Positive for near syncope. Cardiovascular:  No chest pain, edema, orthopnea, palpitations, paroxysmal nocturnal dyspnea. Positive for dyspnea on exertion. Dermatological: No rash, lesions/masses Respiratory: No cough, dyspnea Urologic: No hematuria, dysuria Abdominal:   No nausea, vomiting, diarrhea, bright red blood per rectum, melena, or hematemesis Neurologic:  No visual changes, wkns, changes in mental status. All other systems reviewed and are otherwise negative except as noted above.   Physical Exam:    VS:  BP 132/78   Pulse (!) 116   Ht 5\' 4"  (1.626 m)   Wt 214 lb (97.1 kg)   SpO2 98%   BMI 36.73 kg/m    General: Well developed, well nourished,female appearing in no acute distress. Head: Normocephalic, atraumatic. Neck: No carotid bruits. JVD not elevated.  Lungs: Respirations regular and unlabored, without wheezes or rales.  Heart: Regular rate and rhythm. No S3 or S4.  No murmur, no rubs, or gallops appreciated. Abdomen: Appears non-distended. No obvious abdominal masses. Msk:  Strength and tone appear normal for age. No obvious joint deformities or effusions. Extremities: No clubbing or cyanosis. No lower extremity edema.  Distal pedal pulses are 2+ bilaterally. Neuro: Alert and oriented X 3. Moves all extremities spontaneously. No focal deficits noted. Psych:  Responds to questions appropriately with a normal  affect. Skin: No rashes or lesions noted  Wt Readings from Last 3 Encounters:  09/01/20 214 lb (97.1 kg)  08/17/20 216 lb 0.6 oz (98 kg)  06/22/20 217 lb (98.4 kg)     Studies/Labs  Reviewed:   EKG:  EKG is not ordered today. EKG from 08/17/2020 is reviewed and shows sinus tachycardia, HR 101 with no acute ST changes when compared to prior tracings.    Recent Labs: 01/30/2020: Hemoglobin 12.5; Platelets 298 06/15/2020: ALT 37; BUN 14; Creatinine, Ser 1.03; Potassium 4.0; Sodium 142; TSH 1.390   Lipid Panel    Component Value Date/Time   CHOL 158 01/30/2020 1121   TRIG 79 01/30/2020 1121   HDL 71 01/30/2020 1121   CHOLHDL 2.2 01/30/2020 1121   VLDL 19 03/28/2017 1023   LDLCALC 71 01/30/2020 1121    Additional studies/ records that were reviewed today include:   NST: 01/2019  No diagnostic ST changes to indicate ischemia.  Small, mild intensity, partially reversible apical anterior defect that is consistent with breast attenuation.  This is a low risk study.  Nuclear stress EF: 75%.   Echocardiogram: 01/2019 IMPRESSIONS    1. The left ventricle has normal systolic function with an ejection  fraction of 60-65%. The cavity size was normal. There is moderately  increased left ventricular wall thickness. Left ventricular diastolic  Doppler parameters are consistent with impaired  relaxation. No evidence of left ventricular regional wall motion  abnormalities.  2. The right ventricle has normal systolic function. The cavity was  normal. There is no increase in right ventricular wall thickness. Right  ventricular systolic pressure normal with an estimated pressure of 27.6  mmHg.  3. Trivial pericardial effusion is present.  4. The aortic valve is tricuspid. Mild aortic annular calcification  noted.  5. The mitral valve is grossly normal. There is mild mitral annular  calcification present.  6. The tricuspid valve is grossly normal.  7. The aortic root is  normal in size and structure.   Assessment:    1. Near syncope   2. Essential hypertension   3. Mixed hyperlipidemia   4. Type 2 diabetes mellitus without complication, without long-term current use of insulin (HCC)      Plan:   In order of problems listed above:  1. Near Syncope - Her episodes of acute dizziness, fatigue and diaphoresis are most concerning for a cardiac arrhythmia given their spontaneous nature and abrupt onset. She did previously undergo stress testing and echocardiogram last year which were both reassuring. - Reviewed with the patient and I would recommend a 2-week event monitor to rule out any significant arrhythmias given her events. If this is unrevealing, would consider further work-up with a Coronary CTA given her baseline dyspnea on exertion and multiple cardiac risk factors (HTN, HLD, Type 2 DM, and family history of CAD).   2.HTN - BP was initially elevated to 144/78, rechecked and improved to 132/78. I will see if Social Work has a blood pressure cuff available so she can monitor this at home. - Continue current medication regimen at this time with Amlodipine 10 mg daily, Coreg 6.25 mg twice daily, Clonidine 0.2 mg daily and Spironolactone 25 mg daily. If BP remains above goal, would consider further titration of Coreg given her baseline tachycardia.  3. HLD - Followed by PCP. FLP in 01/2020 showed total cholesterol 158, HDL 71, triglycerides 79 and LDL 71. Not currently on statin therapy.   4. Type 2 DM - Followed by PCP. Hgb A1c was at 6.3 by most recent labs in 06/2020.   Medication Adjustments/Labs and Tests Ordered: Current medicines are reviewed at length with the patient today.  Concerns regarding medicines are outlined above.  Medication changes, Labs and  Tests ordered today are listed in the Patient Instructions below. Patient Instructions  Medication Instructions:  Your physician recommends that you continue on your current medications as  directed. Please refer to the Current Medication list given to you today.  *If you need a refill on your cardiac medications before your next appointment, please call your pharmacy*   Lab Work: None Today If you have labs (blood work) drawn today and your tests are completely normal, you will receive your results only by: Marland Kitchen MyChart Message (if you have MyChart) OR . A paper copy in the mail If you have any lab test that is abnormal or we need to change your treatment, we will call you to review the results.   Testing/Procedures: None Today   Follow-Up: At [facility], you and your health needs are our priority.  As part of our continuing mission to provide you with exceptional heart care, we have created designated Provider Care Teams.  These Care Teams include your primary Cardiologist (physician) and Advanced Practice Providers (APPs -  Physician Assistants and Nurse Practitioners) who all work together to provide you with the care you need, when you need it.  We recommend signing up for the patient portal called "MyChart".  Sign up information is provided on this After Visit Summary.  MyChart is used to connect with patients for Virtual Visits (Telemedicine).  Patients are able to view lab/test results, encounter notes, upcoming appointments, etc.  Non-urgent messages can be sent to your provider as well.   To learn more about what you can do with MyChart, go to [email or web address].    Your next appointment:   2 month(s)  The format for your next appointment:   In Person  Provider:   You may see Rozann Lesches, MD or one of the following Advanced Practice Providers on your designated Care Team:    Bernerd Pho, [place]     Other Instructions ZIO Monitor Placement 09/02/2020       Signed, Erma Heritage, PA-C  09/01/2020 2:21 PM    [place]. [address] [place], [place] [address] Phone: [phone] Fax: [phone]

## 2020-09-02 ENCOUNTER — Ambulatory Visit: Payer: Medicare PPO

## 2020-09-02 DIAGNOSIS — R55 Syncope and collapse: Secondary | ICD-10-CM | POA: Diagnosis not present

## 2020-09-02 NOTE — Addendum Note (Signed)
Addended by: Kerney Elbe on: 09/02/2020 01:26 PM   Modules accepted: Orders

## 2020-09-23 ENCOUNTER — Other Ambulatory Visit: Payer: Self-pay

## 2020-09-23 ENCOUNTER — Encounter: Payer: Self-pay | Admitting: Adult Health

## 2020-09-23 ENCOUNTER — Ambulatory Visit (INDEPENDENT_AMBULATORY_CARE_PROVIDER_SITE_OTHER): Payer: Medicare PPO | Admitting: Adult Health

## 2020-09-23 ENCOUNTER — Ambulatory Visit (INDEPENDENT_AMBULATORY_CARE_PROVIDER_SITE_OTHER): Payer: Medicare PPO

## 2020-09-23 VITALS — BP 132/80 | HR 101 | Ht 64.0 in | Wt 216.0 lb

## 2020-09-23 DIAGNOSIS — L900 Lichen sclerosus et atrophicus: Secondary | ICD-10-CM | POA: Diagnosis not present

## 2020-09-23 DIAGNOSIS — L80 Vitiligo: Secondary | ICD-10-CM

## 2020-09-23 DIAGNOSIS — N898 Other specified noninflammatory disorders of vagina: Secondary | ICD-10-CM

## 2020-09-23 MED ORDER — CLOBETASOL PROPIONATE 0.05 % EX OINT: TOPICAL_OINTMENT | CUTANEOUS | 3 refills | Status: AC

## 2020-09-23 NOTE — Progress Notes (Signed)
  Subjective:     Patient ID: Menaal Russum, female   DOB: 1951-04-04, 70 y.o.   MRN: [id number]  HPI Christinia is a 70 year old black female,married, sp hysterectomy in complaining of external vaginal itching for a year.  PCP is Dr Moshe Cipro.   Review of Systems Has external vaginal itching for a year, is better since using mycolog but PVC and acyclovir did not help Not currently sexually active Reviewed past medical,surgical, social and family history. Reviewed medications and allergies.     Objective:   Physical Exam BP 132/80 (BP Location: Left Arm, Patient Position: Sitting, Cuff Size: Large)   Pulse (!) 101   Ht 5\' 4"  (1.626 m)   Wt 216 lb (98 kg)   BMI 37.08 kg/m  Skin warm and dry.Pelvic: external genitalia is normal in appearance, has vitiligo and there is some thickening of labia , vagina: pale pink with loss of moisture and rugae, tissues more thin near introitus,urethra has no lesions or masses noted, cervix:smooth,atrophic, uterus: is absent,adnexa: no masses or tenderness noted. Bladder is non tender and no masses felt.  AA is 3 Fall risk is low PHQ 9 score is 5, on Prozac and Effexor GAD 7 score is 5    Upstream - 09/23/20 1200      Pregnancy Intention Screening   Does the patient want to become pregnant in the next year? N/A    Does the patient's partner want to become pregnant in the next year? N/A    Would the patient like to discuss contraceptive options today? N/A      Contraception Wrap Up   Current Method Female Sterilization   hyst   End Method Female Sterilization   hyst   Contraception Counseling Provided No         Examination chaperoned by Levy Pupa LPN    Assessment:     1. Vaginal itching Can stop topical PVC and mycolog for now  2. Vitiligo  3. Lichen sclerosus et atrophicus I showed her pictures in the Genital Dermatology Atlas of  And explained this is chronic Will try temovate Meds ordered this encounter  Medications  .  clobetasol ointment (TEMOVATE) 0.05 %    Sig: Apply bid to affected area for 2 weeks then 3 x a week    Dispense:  45 g    Refill:  3    Order Specific Question:   Supervising Provider    Answer:   Florian Buff [[id number]]      Plan:     Will recheck in 6 weeks

## 2020-10-23 ENCOUNTER — Other Ambulatory Visit: Payer: Self-pay | Admitting: Family Medicine

## 2020-10-25 ENCOUNTER — Other Ambulatory Visit: Payer: Self-pay | Admitting: Family Medicine

## 2020-10-29 ENCOUNTER — Other Ambulatory Visit: Payer: Self-pay

## 2020-10-29 ENCOUNTER — Ambulatory Visit
Admission: RE | Admit: 2020-10-29 | Discharge: 2020-10-29 | Disposition: A | Discharge status: Home / Self Care | Payer: Medicare PPO | Source: Ambulatory Visit | Attending: Emergency Medicine | Admitting: Emergency Medicine

## 2020-10-29 VITALS — BP 142/77 | HR 88 | Temp 98.2°F | Resp 16

## 2020-10-29 DIAGNOSIS — Z1152 Encounter for screening for COVID-19: Secondary | ICD-10-CM

## 2020-10-29 DIAGNOSIS — J029 Acute pharyngitis, unspecified: Secondary | ICD-10-CM

## 2020-10-29 DIAGNOSIS — H6593 Unspecified nonsuppurative otitis media, bilateral: Secondary | ICD-10-CM

## 2020-10-29 DIAGNOSIS — Z20822 Contact with and (suspected) exposure to covid-19: Secondary | ICD-10-CM | POA: Diagnosis not present

## 2020-10-29 MED ORDER — PREDNISONE 10 MG PO TABS: 20.0000 mg | ORAL_TABLET | Freq: Every day | ORAL | 0 refills | Status: DC

## 2020-10-29 MED ORDER — LIDOCAINE VISCOUS HCL 2 % MT SOLN: 15.0000 mL | Freq: Four times a day (QID) | OROMUCOSAL | 0 refills | Status: DC | PRN

## 2020-10-29 MED ORDER — FLUTICASONE PROPIONATE 50 MCG/ACT NA SUSP: 1.0000 | Freq: Every day | NASAL | 0 refills | Status: DC

## 2020-10-29 NOTE — ED Provider Notes (Signed)
[facility]   [id number] 10/29/20 Arrival Time: 7510   CC: COVID symptoms  SUBJECTIVE: History from: patient.  Alexis Hurst is a 70 y.o. female presented to the urgent care for complaint of sore throat for the past 3 days.  Denies sick exposure to COVID, flu or strep.  Denies recent travel.  Has tried OTC medicatio without relief.  Denies alleviating or aggravating factors.  Denies previous symptoms in the past.   Denies fever, chills, fatigue, sinus pain, rhinorrhea, SOB, wheezing, chest pain, nausea, changes in bowel or bladder habits.     ROS: As per HPI.  All other pertinent ROS negative.      Past Medical History:  Diagnosis Date  . Allergic sinusitis 12/18/2012  . Allergic urticaria 04/02/2017  . Anxiety   . COVID-19 virus infection 09/05/2019  . Depression   . Diverticulosis 12/11/2013   Colonoscopy in 2014 per Dr Oneida Alar, also has small hemmorhoids   . Dizziness    Mild Orthostatic  . Essential hypertension   . Fatigue   . History of chest pain    Negative stress echo 2010  . Hyperlipidemia   . Insomnia   . Lab test positive for detection of COVID-19 virus 09/24/2019  . Mitral valve prolapse   . Non-allergic rhinitis 10/31/2017  . Obesity   . Paresthesias    Upper extremity /episode sensation of coldness and numbness   . Piles (hemorrhoids) 07/20/2015  . Raynauds phenomenon    Possible  . Type 2 diabetes mellitus ([place])    Past Surgical History:  Procedure Laterality Date  . ABDOMINAL HYSTERECTOMY    . BREAST EXCISIONAL BIOPSY Left   . BREAST SURGERY    . CATARACT EXTRACTION Right 02/03/2020  . CATARACT EXTRACTION Left 02/09/2020  . COLONOSCOPY  06/11/2003   Smith:multiple medium scatered diverticula in the cecum, a sending colon, transverse colon, descending colon, sigmoid colon.  . COLONOSCOPY N/A 05/20/2013   TICS, SML IH  . Cystectomy L breast, benign    . VESICOVAGINAL FISTULA CLOSURE W/ TAH  1989   Allergies  Allergen Reactions  .  Olmesartan Cough  . Lactose Intolerance (Gi) Diarrhea and Nausea And Vomiting   No current facility-administered medications on file prior to encounter.   Current Outpatient Medications on File Prior to Encounter  Medication Sig Dispense Refill  . albuterol (VENTOLIN HFA) 108 (90 Base) MCG/ACT inhaler Inhale 2 puffs into the lungs every 6 (six) hours as needed for wheezing or shortness of breath. 8 g 6  . amLODipine (NORVASC) 10 MG tablet Take one tablet by mouth once daily for blood pressure 30 tablet 3  . aspirin 81 MG EC tablet Take 81 mg by mouth daily.    Marland Kitchen azelastine (ASTELIN) 0.1 % nasal spray Place 2 sprays into both nostrils 2 (two) times daily. Use in each nostril as directed (Patient taking differently: Place 2 sprays into both nostrils as needed. Use in each nostril as directed) 30 mL 12  . Calcium Carbonate-Vitamin D 600-200 MG-UNIT TABS Take 1 tablet by mouth daily.    . carvedilol (COREG) 6.25 MG tablet TAKE 1 TABLET(6.25 MG) BY MOUTH TWICE DAILY WITH A MEAL 180 tablet 1  . clobetasol ointment (TEMOVATE) 0.05 % Apply bid to affected area for 2 weeks then 3 x a week 45 g 3  . cloNIDine (CATAPRES) 0.2 MG tablet TAKE 1 TABLET BY MOUTH EVERY DAY 90 tablet 2  . conjugated estrogens (PREMARIN) vaginal cream Place 1 Applicatorful vaginally daily. (Patient  taking differently: Place 1 Applicatorful vaginally as needed.) 42.5 g 12  . FLUoxetine (PROZAC) 40 MG capsule TAKE 1 CAPSULE(40 MG) BY MOUTH DAILY 30 capsule 5  . montelukast (SINGULAIR) 10 MG tablet TAKE 1 TABLET(10 MG) BY MOUTH AT BEDTIME 90 tablet 0  . nystatin-triamcinolone ointment (MYCOLOG) Apply to affected area twice daily for 1 week, then as needed 60 g 0  . pantoprazole (PROTONIX) 40 MG tablet TAKE 1 TABLET(40 MG) BY MOUTH TWICE DAILY BEFORE A MEAL 180 tablet 3  . spironolactone (ALDACTONE) 25 MG tablet TAKE 1 TABLET(25 MG) BY MOUTH DAILY 90 tablet 3  . Tiotropium Bromide Monohydrate (SPIRIVA RESPIMAT) 1.25 MCG/ACT AERS  Inhale 2 puffs into the lungs daily. (Patient taking differently: Inhale 2 puffs into the lungs as needed.) 4 g 0  . traZODone (DESYREL) 50 MG tablet Take 0.5-1 tablets (25-50 mg total) by mouth at bedtime as needed for sleep. 90 tablet 1  . venlafaxine XR (EFFEXOR-XR) 75 MG 24 hr capsule TAKE 1 CAPSULE(75 MG) BY MOUTH DAILY WITH BREAKFAST 90 capsule 1  . Vitamin D, Ergocalciferol, (DRISDOL) 1.25 MG (50000 UNIT) CAPS capsule TAKE 1 CAPSULE BY MOUTH ONCE A WEEK 12 capsule 1   Social History   Socioeconomic History  . Marital status: Married    Spouse name: Not on file  . Number of children: 2  . Years of education: Not on file  . Highest education level: Not on file  Occupational History  . Occupation: Pharmacist, hospital   Tobacco Use  . Smoking status: Former Smoker    Packs/day: 0.50    Years: 16.00    Pack years: 8.00    Types: Cigarettes    Start date: 17    Quit date: 09/04/1986    Years since quitting: 34.1  . Smokeless tobacco: Never Used  Vaping Use  . Vaping Use: Never used  Substance and Sexual Activity  . Alcohol use: Yes    Alcohol/week: 0.0 standard drinks    Comment: occ  . Drug use: No  . Sexual activity: Not Currently    Birth control/protection: Surgical    Comment: hyst  Other Topics Concern  . Not on file  Social History Narrative  . Not on file   Social Determinants of Health   Financial Resource Strain: Low Risk   . Difficulty of Paying Living Expenses: Not hard at all  Food Insecurity: No Food Insecurity  . Worried About Charity fundraiser in the Last Year: Never true  . Ran Out of Food in the Last Year: Never true  Transportation Needs: No Transportation Needs  . Lack of Transportation (Medical): No  . Lack of Transportation (Non-Medical): No  Physical Activity: Inactive  . Days of Exercise per Week: 0 days  . Minutes of Exercise per Session: 0 min  Stress: Stress Concern Present  . Feeling of Stress : To some extent  Social Connections: Socially  Integrated  . Frequency of Communication with Friends and Family: More than three times a week  . Frequency of Social Gatherings with Friends and Family: More than three times a week  . Attends Religious Services: More than 4 times per year  . Active Member of Clubs or Organizations: Yes  . Attends Archivist Meetings: 1 to 4 times per year  . Marital Status: Married  Human resources officer Violence: Not At Risk  . Fear of Current or Ex-Partner: No  . Emotionally Abused: No  . Physically Abused: No  . Sexually Abused: No  Family History  Problem Relation Age of Onset  . Hypertension Mother   . Urticaria Mother   . Heart attack Mother   . Heart failure Father   . Lung cancer Father   . Heart disease Father   . Asthma Sister   . Heart disease Sister   . Hypertension Brother   . Crohn's disease Brother   . Diabetes Brother   . Hypertension Sister   . Hypertension Sister   . Colon cancer Maternal Grandfather        Age greater than 79  . Breast cancer Daughter   . Allergic rhinitis Neg Hx   . Angioedema Neg Hx   . Atopy Neg Hx   . Eczema Neg Hx   . Immunodeficiency Neg Hx     OBJECTIVE:  Vitals:   10/29/20 1155  BP: (!) 142/77  Pulse: 88  Resp: 16  Temp: 98.2 F (36.8 C)  TempSrc: Oral  SpO2: 97%     General appearance: alert; appears fatigued, but nontoxic; speaking in full sentences and tolerating own secretions HEENT: NCAT; Ears: EACs clear, bilateral TM with middle ear effusion; Eyes: PERRL.  EOM grossly intact. Sinuses: nontender; Nose: nares patent without rhinorrhea, Throat: oropharynx clear, tonsils non erythematous or enlarged, uvula midline  Neck: supple without LAD Lungs: unlabored respirations, symmetrical air entry; cough: moderate; no respiratory distress; CTAB Heart: regular rate and rhythm.  Radial pulses 2+ symmetrical bilaterally Skin: warm and dry Psychological: alert and cooperative; normal mood and affect  LABS:  No results found for  this or any previous visit (from the past 24 hour(s)).   ASSESSMENT & PLAN:  1. Encounter for screening for COVID-19   2. Sore throat   3. Middle ear effusion, bilateral     Meds ordered this encounter  Medications  . predniSONE (DELTASONE) 10 MG tablet    Sig: Take 2 tablets (20 mg total) by mouth daily.    Dispense:  15 tablet    Refill:  0  . fluticasone (FLONASE) 50 MCG/ACT nasal spray    Sig: Place 1 spray into both nostrils daily for 14 days.    Dispense:  16 g    Refill:  0  . lidocaine (XYLOCAINE) 2 % solution    Sig: Use as directed 15 mLs in the mouth or throat every 6 (six) hours as needed for mouth pain.    Dispense:  100 mL    Refill:  0   Discharge Instructions.    COVID-19, flu A/B testing ordered.  It will take between 2-7 days for test results.  Someone will contact you regarding abnormal results.    Get plenty of rest and push fluids Lidocaine mouthwash was prescribed for sore throat Flonase and prednisone were prescribed for middle ear effusion Use OTC  Halls or Vicks to soothe throat May also use OTC Cepacol, or Chloraseptic to numb throat Use OTC zyrtec for nasal congestion, runny nose, and/or sore throat Use medications daily for symptom relief Use OTC medications like ibuprofen or tylenol as needed fever or pain Call or go to the ED if you have any new or worsening symptoms such as fever, worsening cough, shortness of breath, chest tightness, chest pain, turning blue, changes in mental status, etc...   Reviewed expectations re: course of current medical issues. Questions answered. Outlined signs and symptoms indicating need for more acute intervention. Patient verbalized understanding. After Visit Summary given.         Emerson Monte, FNP 10/29/20  1255  

## 2020-10-29 NOTE — ED Triage Notes (Signed)
Sore throat x 3 days.  States she is having a lot of drainage.  Clear drainage.  States ears feel funny today.

## 2020-10-29 NOTE — Discharge Instructions (Signed)
COVID-19, flu A/B testing ordered.  It will take between 2-7 days for test results.  Someone will contact you regarding abnormal results.    Get plenty of rest and push fluids Lidocaine mouthwash was prescribed for sore throat Flonase and prednisone were prescribed for middle ear effusion Use OTC  Halls or Vicks to soothe throat May also use OTC Cepacol, or Chloraseptic to numb throat Use OTC zyrtec for nasal congestion, runny nose, and/or sore throat Use medications daily for symptom relief Use OTC medications like ibuprofen or tylenol as needed fever or pain Call or go to the ED if you have any new or worsening symptoms such as fever, worsening cough, shortness of breath, chest tightness, chest pain, turning blue, changes in mental status, etc..Marland Kitchen

## 2020-10-31 LAB — COVID-19, FLU A+B NAA
Influenza A, NAA: NOT DETECTED
Influenza B, NAA: NOT DETECTED
SARS-CoV-2, NAA: NOT DETECTED

## 2020-11-03 ENCOUNTER — Ambulatory Visit: Payer: Medicare PPO | Admitting: Family Medicine

## 2020-11-04 ENCOUNTER — Encounter: Payer: Self-pay | Admitting: Adult Health

## 2020-11-04 ENCOUNTER — Ambulatory Visit (INDEPENDENT_AMBULATORY_CARE_PROVIDER_SITE_OTHER): Payer: Medicare PPO | Admitting: Adult Health

## 2020-11-04 ENCOUNTER — Other Ambulatory Visit: Payer: Self-pay

## 2020-11-04 VITALS — BP 120/71 | HR 100 | Ht 64.0 in | Wt 216.0 lb

## 2020-11-04 DIAGNOSIS — L900 Lichen sclerosus et atrophicus: Secondary | ICD-10-CM

## 2020-11-04 DIAGNOSIS — L80 Vitiligo: Secondary | ICD-10-CM

## 2020-11-04 NOTE — Progress Notes (Signed)
  Subjective:     Patient ID: Morghan Kester, female   DOB: [birth year], 70 y.o.   MRN: [id number]  HPI Ezelle is a 70 year old black female,married,sp hysterectomy back in follow up on having external vaginal itching for a year and was started on temovate and it resolved. PCP is Dr Moshe Cipro.   Review of Systems The itching stopped and she stopped temovate and it returned and so she started temovate back and it resolved. Reviewed past medical,surgical, social and family history. Reviewed medications and allergies.     Objective:   Physical Exam BP 120/71 (BP Location: Left Arm, Patient Position: Sitting, Cuff Size: Normal)   Pulse 100   Ht 5\' 4"  (1.626 m)   Wt 216 lb (98 kg)   BMI 37.08 kg/m    Skin warm and dry.Pelvic: external genitalia is normal in appearance,has some vitiligo and the labia are less thick    Upstream - 11/04/20 1127      Pregnancy Intention Screening   Does the patient want to become pregnant in the next year? N/A    Does the patient's partner want to become pregnant in the next year? N/A    Would the patient like to discuss contraceptive options today? N/A      Contraception Wrap Up   Current Method Female Sterilization   hyst   End Method Female Sterilization   hyst   Contraception Counseling Provided No         Examination chaperoned by [facility] LPN.  Assessment:     1. Lichen sclerosus et atrophicus Use temovate 2-3 x weekly,has refills She is aware it is chronic   2. Vitiligo    Plan:     Follow up prn

## 2020-11-04 NOTE — Progress Notes (Signed)
Constipation 

## 2020-11-09 ENCOUNTER — Other Ambulatory Visit: Payer: Self-pay | Admitting: Family Medicine

## 2020-11-16 ENCOUNTER — Encounter: Payer: Self-pay | Admitting: Student

## 2020-11-16 ENCOUNTER — Other Ambulatory Visit: Payer: Self-pay

## 2020-11-16 ENCOUNTER — Ambulatory Visit (INDEPENDENT_AMBULATORY_CARE_PROVIDER_SITE_OTHER): Payer: Medicare PPO | Admitting: Student

## 2020-11-16 VITALS — BP 122/72 | HR 94 | Ht 64.0 in | Wt 214.8 lb

## 2020-11-16 DIAGNOSIS — R55 Syncope and collapse: Secondary | ICD-10-CM | POA: Diagnosis not present

## 2020-11-16 DIAGNOSIS — I10 Essential (primary) hypertension: Secondary | ICD-10-CM | POA: Diagnosis not present

## 2020-11-16 DIAGNOSIS — E782 Mixed hyperlipidemia: Secondary | ICD-10-CM | POA: Diagnosis not present

## 2020-11-16 DIAGNOSIS — R002 Palpitations: Secondary | ICD-10-CM | POA: Diagnosis not present

## 2020-11-16 MED ORDER — CARVEDILOL 6.25 MG PO TABS: 9.3750 mg | ORAL_TABLET | Freq: Two times a day (BID) | ORAL | 2 refills | Status: DC

## 2020-11-16 NOTE — Progress Notes (Signed)
Cardiology Office Note    Date:  11/16/2020   ID:  Hurst, Alexis [birth year], MRN [id number]  PCP:  Fayrene Helper, MD  Cardiologist: Rozann Lesches, MD    Chief Complaint  Patient presents with  . Follow-up    3 month visit    History of Present Illness:    Alexis Hurst is a 70 y.o. female with past medical history of HTN, HLD and Type 2 DM who presents to the office today for 60-month follow-up.  She was last examined myself in 08/2020 and reported two distinct episodes of dizziness, fatigue and diaphoresis which occurred within the past few months. She denied any associated chest pain or palpitations and her episodes spontaneously resolved within 20 minutes. It was recommended that she wear a 2-week event monitor to rule out any significant arrhythmias and could consider a Coronary CTA for further evaluation if needed in the future. Her event monitor showed predominantly normal sinus rhythm with an average heart rate of 96 bpm. She did have rare PAC's and rare PVC's with 3 beat episode of NSVT. There were no sustained arrhythmias or pauses. She reported doing well when notified of the results, therefore no changes were made in her medication regimen.  In talking with the patient today, she does report occasional palpitations with her heart "flopping" and going fast. Says this can occur a few times each week and typically occurs when active. Denies any presyncopal episodes resembling her prior symptoms in late 2021. She does report her breathing has been stable and denies any recent orthopnea, PND or lower extremity edema. Has noticed fatigue for several months.   She does consume Pepsi sodas and tea on a daily basis. Reports occasional wine consumption but has not noticed this triggering her episodes.  Past Medical History:  Diagnosis Date  . Allergic sinusitis 12/18/2012  . Allergic urticaria 04/02/2017  . Anxiety   . COVID-19 virus infection 09/05/2019  .  Depression   . Diverticulosis 12/11/2013   Colonoscopy in 2014 per Dr Oneida Alar, also has small hemmorhoids   . Dizziness    Mild Orthostatic  . Essential hypertension   . Fatigue   . History of chest pain    Negative stress echo 2010  . Hyperlipidemia   . Insomnia   . Lab test positive for detection of COVID-19 virus 09/24/2019  . Mitral valve prolapse   . Non-allergic rhinitis 10/31/2017  . Obesity   . Paresthesias    Upper extremity /episode sensation of coldness and numbness   . Piles (hemorrhoids) 07/20/2015  . Raynauds phenomenon    Possible  . Type 2 diabetes mellitus ([place])     Past Surgical History:  Procedure Laterality Date  . ABDOMINAL HYSTERECTOMY    . BREAST EXCISIONAL BIOPSY Left   . BREAST SURGERY    . CATARACT EXTRACTION Right 02/03/2020  . CATARACT EXTRACTION Left 02/09/2020  . COLONOSCOPY  06/11/2003   Smith:multiple medium scatered diverticula in the cecum, a sending colon, transverse colon, descending colon, sigmoid colon.  . COLONOSCOPY N/A 05/20/2013   TICS, SML IH  . Cystectomy L breast, benign    . VESICOVAGINAL FISTULA CLOSURE W/ TAH  1989    Current Medications: Outpatient Medications Prior to Visit  Medication Sig Dispense Refill  . albuterol (VENTOLIN HFA) 108 (90 Base) MCG/ACT inhaler Inhale 2 puffs into the lungs every 6 (six) hours as needed for wheezing or shortness of breath. 8 g 6  . amLODipine (NORVASC) 10 MG  tablet Take one tablet by mouth once daily for blood pressure 30 tablet 3  . aspirin 81 MG EC tablet Take 81 mg by mouth daily.    Marland Kitchen azelastine (ASTELIN) 0.1 % nasal spray Place 2 sprays into both nostrils 2 (two) times daily. Use in each nostril as directed (Patient taking differently: Place 2 sprays into both nostrils as needed. Use in each nostril as directed) 30 mL 12  . Calcium Carbonate-Vitamin D 600-200 MG-UNIT TABS Take 1 tablet by mouth daily.    . clobetasol ointment (TEMOVATE) 0.05 % Apply bid to affected area for 2 weeks then 3  x a week 45 g 3  . cloNIDine (CATAPRES) 0.2 MG tablet TAKE 1 TABLET BY MOUTH EVERY DAY 90 tablet 2  . conjugated estrogens (PREMARIN) vaginal cream Place 1 Applicatorful vaginally daily. (Patient taking differently: Place 1 Applicatorful vaginally as needed.) 42.5 g 12  . FLUoxetine (PROZAC) 40 MG capsule TAKE 1 CAPSULE(40 MG) BY MOUTH DAILY 30 capsule 5  . lidocaine (XYLOCAINE) 2 % solution Use as directed 15 mLs in the mouth or throat every 6 (six) hours as needed for mouth pain. 100 mL 0  . montelukast (SINGULAIR) 10 MG tablet TAKE 1 TABLET(10 MG) BY MOUTH AT BEDTIME 90 tablet 0  . nystatin-triamcinolone ointment (MYCOLOG) Apply to affected area twice daily for 1 week, then as needed 60 g 0  . pantoprazole (PROTONIX) 40 MG tablet TAKE 1 TABLET(40 MG) BY MOUTH TWICE DAILY BEFORE A MEAL 180 tablet 3  . spironolactone (ALDACTONE) 25 MG tablet TAKE 1 TABLET(25 MG) BY MOUTH DAILY 90 tablet 3  . Tiotropium Bromide Monohydrate (SPIRIVA RESPIMAT) 1.25 MCG/ACT AERS Inhale 2 puffs into the lungs daily. (Patient taking differently: Inhale 2 puffs into the lungs as needed.) 4 g 0  . traZODone (DESYREL) 50 MG tablet Take 0.5-1 tablets (25-50 mg total) by mouth at bedtime as needed for sleep. 90 tablet 1  . venlafaxine XR (EFFEXOR-XR) 75 MG 24 hr capsule TAKE 1 CAPSULE(75 MG) BY MOUTH DAILY WITH BREAKFAST 90 capsule 1  . Vitamin D, Ergocalciferol, (DRISDOL) 1.25 MG (50000 UNIT) CAPS capsule TAKE 1 CAPSULE BY MOUTH ONCE A WEEK 12 capsule 1  . carvedilol (COREG) 6.25 MG tablet TAKE 1 TABLET(6.25 MG) BY MOUTH TWICE DAILY WITH A MEAL 180 tablet 1  . predniSONE (DELTASONE) 10 MG tablet Take 2 tablets (20 mg total) by mouth daily. 15 tablet 0  . fluticasone (FLONASE) 50 MCG/ACT nasal spray Place 1 spray into both nostrils daily for 14 days. 16 g 0   No facility-administered medications prior to visit.     Allergies:   Olmesartan and Lactose intolerance (gi)   Social History   Socioeconomic History  . Marital  status: Married    Spouse name: Not on file  . Number of children: 2  . Years of education: Not on file  . Highest education level: Not on file  Occupational History  . Occupation: Pharmacist, hospital   Tobacco Use  . Smoking status: Former Smoker    Packs/day: 0.50    Years: 16.00    Pack years: 8.00    Types: Cigarettes    Start date: 72    Quit date: 09/04/1986    Years since quitting: 34.2  . Smokeless tobacco: Never Used  Vaping Use  . Vaping Use: Never used  Substance and Sexual Activity  . Alcohol use: Yes    Alcohol/week: 0.0 standard drinks    Comment: occ  . Drug use: No  .  Sexual activity: Not Currently    Birth control/protection: Surgical    Comment: hyst  Other Topics Concern  . Not on file  Social History Narrative  . Not on file   Social Determinants of Health   Financial Resource Strain: Low Risk   . Difficulty of Paying Living Expenses: Not hard at all  Food Insecurity: No Food Insecurity  . Worried About Charity fundraiser in the Last Year: Never true  . Ran Out of Food in the Last Year: Never true  Transportation Needs: No Transportation Needs  . Lack of Transportation (Medical): No  . Lack of Transportation (Non-Medical): No  Physical Activity: Inactive  . Days of Exercise per Week: 0 days  . Minutes of Exercise per Session: 0 min  Stress: Stress Concern Present  . Feeling of Stress : To some extent  Social Connections: Socially Integrated  . Frequency of Communication with Friends and Family: More than three times a week  . Frequency of Social Gatherings with Friends and Family: More than three times a week  . Attends Religious Services: More than 4 times per year  . Active Member of Clubs or Organizations: Yes  . Attends Archivist Meetings: 1 to 4 times per year  . Marital Status: Married     Family History:  The patient's family history includes Asthma in her sister; Breast cancer in her daughter; Colon cancer in her maternal  grandfather; Crohn's disease in her brother; Diabetes in her brother; Heart attack in her mother; Heart disease in her father and sister; Heart failure in her father; Hypertension in her brother, mother, sister, and sister; Lung cancer in her father; Urticaria in her mother.   Review of Systems:   Please see the history of present illness.     General:  No chills, fever, night sweats or weight changes.  Cardiovascular:  No chest pain, dyspnea on exertion, edema, orthopnea, paroxysmal nocturnal dyspnea. Positive for palpitations.  Dermatological: No rash, lesions/masses Respiratory: No cough, dyspnea Urologic: No hematuria, dysuria Abdominal:   No nausea, vomiting, diarrhea, bright red blood per rectum, melena, or hematemesis Neurologic:  No visual changes, wkns, changes in mental status. All other systems reviewed and are otherwise negative except as noted above.   Physical Exam:    VS:  BP 122/72   Pulse 94   Ht 5\' 4"  (1.626 m)   Wt 214 lb 12.8 oz (97.4 kg)   SpO2 98%   BMI 36.87 kg/m    General: Well developed, well nourished,female appearing in no acute distress. Head: Normocephalic, atraumatic. Neck: No carotid bruits. JVD not elevated.  Lungs: Respirations regular and unlabored, without wheezes or rales.  Heart: Regular rate and rhythm. No S3 or S4.  No murmur, no rubs, or gallops appreciated. Abdomen: Appears non-distended. No obvious abdominal masses. Msk:  Strength and tone appear normal for age. No obvious joint deformities or effusions. Extremities: No clubbing or cyanosis. No lower extremity edema.  Distal pedal pulses are 2+ bilaterally. Neuro: Alert and oriented X 3. Moves all extremities spontaneously. No focal deficits noted. Psych:  Responds to questions appropriately with a normal affect. Skin: No rashes or lesions noted  Wt Readings from Last 3 Encounters:  11/16/20 214 lb 12.8 oz (97.4 kg)  11/04/20 216 lb (98 kg)  09/23/20 216 lb (98 kg)     Studies/Labs  Reviewed:   EKG:  EKG is not ordered today.   Recent Labs: 01/30/2020: Hemoglobin 12.5; Platelets 298 06/15/2020: ALT 37;  BUN 14; Creatinine, Ser 1.03; Potassium 4.0; Sodium 142; TSH 1.390   Lipid Panel    Component Value Date/Time   CHOL 158 01/30/2020 1121   TRIG 79 01/30/2020 1121   HDL 71 01/30/2020 1121   CHOLHDL 2.2 01/30/2020 1121   VLDL 19 03/28/2017 1023   LDLCALC 71 01/30/2020 1121    Additional studies/ records that were reviewed today include:   NST: 01/2019  No diagnostic ST changes to indicate ischemia.  Small, mild intensity, partially reversible apical anterior defect that is consistent with breast attenuation.  This is a low risk study.  Nuclear stress EF: 75%.   Echocardiogram: 01/2019 IMPRESSIONS    1. The left ventricle has normal systolic function with an ejection  fraction of 60-65%. The cavity size was normal. There is moderately  increased left ventricular wall thickness. Left ventricular diastolic  Doppler parameters are consistent with impaired  relaxation. No evidence of left ventricular regional wall motion  abnormalities.  2. The right ventricle has normal systolic function. The cavity was  normal. There is no increase in right ventricular wall thickness. Right  ventricular systolic pressure normal with an estimated pressure of 27.6  mmHg.  3. Trivial pericardial effusion is present.  4. The aortic valve is tricuspid. Mild aortic annular calcification  noted.  5. The mitral valve is grossly normal. There is mild mitral annular  calcification present.  6. The tricuspid valve is grossly normal.  7. The aortic root is normal in size and structure.    Event Monitor: 09/2020 [employer] monitor reviewed, 13 days 8 hours analyzed.  Predominant rhythm is sinus with heart rate ranging from 77 bpm up to 146 bpm and average heart rate 96 bpm.  Rare PACs were noted representing less than 1% total beats.  Rare PVCs were noted representing  less than 1% total beats.  3 beat episode of NSVT noted.  Patient triggered event corresponded to normal sinus rhythm.  There were no sustained arrhythmias or pauses.   Assessment:    1. Palpitations   2. Near syncope   3. Mixed hyperlipidemia   4. Essential hypertension      Plan:   In order of problems listed above:  1. Palpitations/ Near Syncope - Reent event monitor showed predominantly normal sinus rhythm with an average heart rate of 96 bpm with rare PAC's and rare PVC's with 3 beat episode of NSVT. There were no sustained arrhythmias or pauses.  - She continues to have palpitations and I recommended that we increase her Coreg from 6.25 mg twice daily to 9.375 mg twice daily. She was encouraged to track  HR/BP at home and report back with readings. Can possibly titrate to 12.5 mg twice daily but may need to reduce Amlodipine to allow room in her blood pressure for this. We also reviewed the importance of limiting her caffeine intake.   - She did express concerns about her palpitations and fatigue with activity and we reviewed possibly obtaining a Treadmill Myoview (patient wishes to try walking on the treadmill but may need to switch to [facility]) for further evaluation but will see how she does with medication adjustments initially. Would need to hold Coreg the evening before and morning of stress testing if pursuing this in the future.   2. HLD - LDL at 71 by labs in 01/2020. Not currently on statin therapy.   3. HTN - BP is well controlled at 122/72 during today's visit. Continue Amlodipine 10mg  daily, Clonidine 0.2mg  daily and Spironolactone  25mg  daily. Will titrate Coreg to 9.375mg  BID as outlined above.    Medication Adjustments/Labs and Tests Ordered: Current medicines are reviewed at length with the patient today.  Concerns regarding medicines are outlined above.  Medication changes, Labs and Tests ordered today are listed in the Patient Instructions below. Patient  Instructions  Medication Instructions:  INCREASE Coreg to 9.375 mg twice (2) daily  *If you need a refill on your cardiac medications before your next appointment, please call your pharmacy*   Lab Work: None If you have labs (blood work) drawn today and your tests are completely normal, you will receive your results only by: Marland Kitchen MyChart Message (if you have MyChart) OR . A paper copy in the mail If you have any lab test that is abnormal or we need to change your treatment, we will call you to review the results.   Testing/Procedures: None   Follow-Up: At [facility], you and your health needs are our priority.  As part of our continuing mission to provide you with exceptional heart care, we have created designated Provider Care Teams.  These Care Teams include your primary Cardiologist (physician) and Advanced Practice Providers (APPs -  Physician Assistants and Nurse Practitioners) who all work together to provide you with the care you need, when you need it.  We recommend signing up for the patient portal called "MyChart".  Sign up information is provided on this After Visit Summary.  MyChart is used to connect with patients for Virtual Visits (Telemedicine).  Patients are able to view lab/test results, encounter notes, upcoming appointments, etc.  Non-urgent messages can be sent to your provider as well.   To learn more about what you can do with MyChart, go to [email or web address].    Your next appointment:   3 month(s)  The format for your next appointment:   In Person  Provider:   Bernerd Pho, PA-C   Other Instructions Please keep track of blood pressure and heart rate using log and update our office in 3-4 weeks.        Signed, Erma Heritage, PA-C  11/16/2020 4:59 PM    [facility] [address] [place], [place] [address] Phone: [phone] Fax: [phone]

## 2020-11-16 NOTE — Patient Instructions (Signed)
Medication Instructions:  INCREASE Coreg to 9.375 mg twice (2) daily  *If you need a refill on your cardiac medications before your next appointment, please call your pharmacy*   Lab Work: None If you have labs (blood work) drawn today and your tests are completely normal, you will receive your results only by: Marland Kitchen MyChart Message (if you have MyChart) OR . A paper copy in the mail If you have any lab test that is abnormal or we need to change your treatment, we will call you to review the results.   Testing/Procedures: None   Follow-Up: At [facility], you and your health needs are our priority.  As part of our continuing mission to provide you with exceptional heart care, we have created designated Provider Care Teams.  These Care Teams include your primary Cardiologist (physician) and Advanced Practice Providers (APPs -  Physician Assistants and Nurse Practitioners) who all work together to provide you with the care you need, when you need it.  We recommend signing up for the patient portal called "MyChart".  Sign up information is provided on this After Visit Summary.  MyChart is used to connect with patients for Virtual Visits (Telemedicine).  Patients are able to view lab/test results, encounter notes, upcoming appointments, etc.  Non-urgent messages can be sent to your provider as well.   To learn more about what you can do with MyChart, go to [email or web address].    Your next appointment:   3 month(s)  The format for your next appointment:   In Person  Provider:   Bernerd Pho, PA-C   Other Instructions Please keep track of blood pressure and heart rate using log and update our office in 3-4 weeks.

## 2020-12-09 ENCOUNTER — Encounter: Payer: Self-pay | Admitting: Emergency Medicine

## 2020-12-09 ENCOUNTER — Ambulatory Visit
Admission: EM | Admit: 2020-12-09 | Discharge: 2020-12-09 | Disposition: A | Discharge status: Home / Self Care | Payer: Medicare PPO | Attending: Emergency Medicine | Admitting: Emergency Medicine

## 2020-12-09 ENCOUNTER — Other Ambulatory Visit: Payer: Self-pay

## 2020-12-09 DIAGNOSIS — J329 Chronic sinusitis, unspecified: Secondary | ICD-10-CM | POA: Diagnosis not present

## 2020-12-09 DIAGNOSIS — R059 Cough, unspecified: Secondary | ICD-10-CM

## 2020-12-09 DIAGNOSIS — B9789 Other viral agents as the cause of diseases classified elsewhere: Secondary | ICD-10-CM | POA: Diagnosis not present

## 2020-12-09 MED ORDER — BENZONATATE 100 MG PO CAPS: 100.0000 mg | ORAL_CAPSULE | Freq: Three times a day (TID) | ORAL | 0 refills | Status: DC

## 2020-12-09 MED ORDER — AMOXICILLIN-POT CLAVULANATE 875-125 MG PO TABS: 1.0000 | ORAL_TABLET | Freq: Two times a day (BID) | ORAL | 0 refills | Status: AC

## 2020-12-09 MED ORDER — PREDNISONE 20 MG PO TABS: 20.0000 mg | ORAL_TABLET | Freq: Two times a day (BID) | ORAL | 0 refills | Status: AC

## 2020-12-09 NOTE — ED Provider Notes (Signed)
[place]   [id number] 12/09/20 Arrival Time: 8469   CC: Cold symptoms  SUBJECTIVE: History from: patient.  Cloris Flippo is a 70 y.o. female who presents with nasal congestion, sinus pain/ pressure, and cough x 3 days.  Denies sick exposure to COVID, flu or strep.  Has tried OTC medications without relief.  Symptoms are made worse at night.  Reports previous symptoms in the past with sinus infection.   Denies fever, chills, SOB, wheezing, chest pain, nausea, changes in bowel or bladder habits.    ROS: As per HPI.  All other pertinent ROS negative.     Past Medical History:  Diagnosis Date  . Allergic sinusitis 12/18/2012  . Allergic urticaria 04/02/2017  . Anxiety   . COVID-19 virus infection 09/05/2019  . Depression   . Diverticulosis 12/11/2013   Colonoscopy in 2014 per Dr Oneida Alar, also has small hemmorhoids   . Dizziness    Mild Orthostatic  . Essential hypertension   . Fatigue   . History of chest pain    Negative stress echo 2010  . Hyperlipidemia   . Insomnia   . Lab test positive for detection of COVID-19 virus 09/24/2019  . Mitral valve prolapse   . Non-allergic rhinitis 10/31/2017  . Obesity   . Paresthesias    Upper extremity /episode sensation of coldness and numbness   . Piles (hemorrhoids) 07/20/2015  . Raynauds phenomenon    Possible  . Type 2 diabetes mellitus ([place])    Past Surgical History:  Procedure Laterality Date  . ABDOMINAL HYSTERECTOMY    . BREAST EXCISIONAL BIOPSY Left   . BREAST SURGERY    . CATARACT EXTRACTION Right 02/03/2020  . CATARACT EXTRACTION Left 02/09/2020  . COLONOSCOPY  06/11/2003   [facility]:multiple medium scatered diverticula in the cecum, a sending colon, transverse colon, descending colon, sigmoid colon.  . COLONOSCOPY N/A 05/20/2013   TICS, SML IH  . Cystectomy L breast, benign    . VESICOVAGINAL FISTULA CLOSURE W/ TAH  1989   Allergies  Allergen Reactions  . Olmesartan Cough  . Lactose Intolerance (Gi)  Diarrhea and Nausea And Vomiting   No current facility-administered medications on file prior to encounter.   Current Outpatient Medications on File Prior to Encounter  Medication Sig Dispense Refill  . albuterol (VENTOLIN HFA) 108 (90 Base) MCG/ACT inhaler Inhale 2 puffs into the lungs every 6 (six) hours as needed for wheezing or shortness of breath. 8 g 6  . amLODipine (NORVASC) 10 MG tablet Take one tablet by mouth once daily for blood pressure 30 tablet 3  . aspirin 81 MG EC tablet Take 81 mg by mouth daily.    Marland Kitchen azelastine (ASTELIN) 0.1 % nasal spray Place 2 sprays into both nostrils 2 (two) times daily. Use in each nostril as directed (Patient taking differently: Place 2 sprays into both nostrils as needed. Use in each nostril as directed) 30 mL 12  . Calcium Carbonate-Vitamin D 600-200 MG-UNIT TABS Take 1 tablet by mouth daily.    . carvedilol (COREG) 6.25 MG tablet Take 1.5 tablets (9.375 mg total) by mouth 2 (two) times daily with a meal. 360 tablet 2  . clobetasol ointment (TEMOVATE) 0.05 % Apply bid to affected area for 2 weeks then 3 x a week 45 g 3  . cloNIDine (CATAPRES) 0.2 MG tablet TAKE 1 TABLET BY MOUTH EVERY DAY 90 tablet 2  . conjugated estrogens (PREMARIN) vaginal cream Place 1 Applicatorful vaginally daily. (Patient taking differently: Place 1  Applicatorful vaginally as needed.) 42.5 g 12  . FLUoxetine (PROZAC) 40 MG capsule TAKE 1 CAPSULE(40 MG) BY MOUTH DAILY 30 capsule 5  . fluticasone (FLONASE) 50 MCG/ACT nasal spray Place 1 spray into both nostrils daily for 14 days. 16 g 0  . lidocaine (XYLOCAINE) 2 % solution Use as directed 15 mLs in the mouth or throat every 6 (six) hours as needed for mouth pain. 100 mL 0  . montelukast (SINGULAIR) 10 MG tablet TAKE 1 TABLET(10 MG) BY MOUTH AT BEDTIME 90 tablet 0  . nystatin-triamcinolone ointment (MYCOLOG) Apply to affected area twice daily for 1 week, then as needed 60 g 0  . pantoprazole (PROTONIX) 40 MG tablet TAKE 1 TABLET(40  MG) BY MOUTH TWICE DAILY BEFORE A MEAL 180 tablet 3  . spironolactone (ALDACTONE) 25 MG tablet TAKE 1 TABLET(25 MG) BY MOUTH DAILY 90 tablet 3  . Tiotropium Bromide Monohydrate (SPIRIVA RESPIMAT) 1.25 MCG/ACT AERS Inhale 2 puffs into the lungs daily. (Patient taking differently: Inhale 2 puffs into the lungs as needed.) 4 g 0  . traZODone (DESYREL) 50 MG tablet Take 0.5-1 tablets (25-50 mg total) by mouth at bedtime as needed for sleep. 90 tablet 1  . venlafaxine XR (EFFEXOR-XR) 75 MG 24 hr capsule TAKE 1 CAPSULE(75 MG) BY MOUTH DAILY WITH BREAKFAST 90 capsule 1  . Vitamin D, Ergocalciferol, (DRISDOL) 1.25 MG (50000 UNIT) CAPS capsule TAKE 1 CAPSULE BY MOUTH ONCE A WEEK 12 capsule 1   Social History   Socioeconomic History  . Marital status: Married    Spouse name: Not on file  . Number of children: 2  . Years of education: Not on file  . Highest education level: Not on file  Occupational History  . Occupation: Pharmacist, hospital   Tobacco Use  . Smoking status: Former Smoker    Packs/day: 0.50    Years: 16.00    Pack years: 8.00    Types: Cigarettes    Start date: 50    Quit date: 09/04/1986    Years since quitting: 34.2  . Smokeless tobacco: Never Used  Vaping Use  . Vaping Use: Never used  Substance and Sexual Activity  . Alcohol use: Yes    Alcohol/week: 0.0 standard drinks    Comment: occ  . Drug use: No  . Sexual activity: Not Currently    Birth control/protection: Surgical    Comment: hyst  Other Topics Concern  . Not on file  Social History Narrative  . Not on file   Social Determinants of Health   Financial Resource Strain: Low Risk   . Difficulty of Paying Living Expenses: Not hard at all  Food Insecurity: No Food Insecurity  . Worried About Charity fundraiser in the Last Year: Never true  . Ran Out of Food in the Last Year: Never true  Transportation Needs: No Transportation Needs  . Lack of Transportation (Medical): No  . Lack of Transportation (Non-Medical): No   Physical Activity: Inactive  . Days of Exercise per Week: 0 days  . Minutes of Exercise per Session: 0 min  Stress: Stress Concern Present  . Feeling of Stress : To some extent  Social Connections: Socially Integrated  . Frequency of Communication with Friends and Family: More than three times a week  . Frequency of Social Gatherings with Friends and Family: More than three times a week  . Attends Religious Services: More than 4 times per year  . Active Member of Clubs or Organizations: Yes  .  Attends Archivist Meetings: 1 to 4 times per year  . Marital Status: Married  Human resources officer Violence: Not At Risk  . Fear of Current or Ex-Partner: No  . Emotionally Abused: No  . Physically Abused: No  . Sexually Abused: No   Family History  Problem Relation Age of Onset  . Hypertension Mother   . Urticaria Mother   . Heart attack Mother   . Heart failure Father   . Lung cancer Father   . Heart disease Father   . Asthma Sister   . Heart disease Sister   . Hypertension Brother   . Crohn's disease Brother   . Diabetes Brother   . Hypertension Sister   . Hypertension Sister   . Colon cancer Maternal Grandfather        Age greater than 33  . Breast cancer Daughter   . Allergic rhinitis Neg Hx   . Angioedema Neg Hx   . Atopy Neg Hx   . Eczema Neg Hx   . Immunodeficiency Neg Hx     OBJECTIVE:  Vitals:   12/09/20 1040  BP: (!) 153/90  Pulse: (!) 109  Resp: 17  Temp: 99.1 F (37.3 C)  TempSrc: Oral  SpO2: 95%    General appearance: alert; appears fatigued, but nontoxic; speaking in full sentences and tolerating own secretions HEENT: NCAT; Ears: EACs clear, TMs pearly gray; Eyes: PERRL.  EOM grossly intact. Sinuses: TTP; Nose: nares patent without rhinorrhea, Throat: oropharynx clear, tonsils non erythematous or enlarged, uvula midline  Neck: supple without LAD Lungs: unlabored respirations, symmetrical air entry; cough: absent; no respiratory distress;  CTAB Heart: regular rate and rhythm.  Skin: warm and dry Psychological: alert and cooperative; normal mood and affect  ASSESSMENT & PLAN:  1. Viral sinusitis   2. Cough     Meds ordered this encounter  Medications  . predniSONE (DELTASONE) 20 MG tablet    Sig: Take 1 tablet (20 mg total) by mouth 2 (two) times daily with a meal for 5 days.    Dispense:  10 tablet    Refill:  0    Order Specific Question:   Supervising Provider    Answer:   Raylene Everts [[id number]]  . amoxicillin-clavulanate (AUGMENTIN) 875-125 MG tablet    Sig: Take 1 tablet by mouth every 12 (twelve) hours for 10 days.    Dispense:  20 tablet    Refill:  0    Order Specific Question:   Supervising Provider    Answer:   Raylene Everts [[id number]]  . benzonatate (TESSALON) 100 MG capsule    Sig: Take 1 capsule (100 mg total) by mouth every 8 (eight) hours.    Dispense:  21 capsule    Refill:  0    Order Specific Question:   Supervising Provider    Answer:   Raylene Everts [[id number]]   Rest and push fluids Tessalon for cough Prednisone prescribed.  Use daily for symptomatic relief Augmentin prescribed.  Wait 2-3 days before filling medication as your symptoms are most likely viral at this time. Take as directed and to completion Continue with OTC ibuprofen/tylenol as needed for pain Follow up with PCP or [facility] if symptoms persists Return or go to the ED if you have any new or worsening symptoms such as fever, chills, worsening sinus pain/pressure, cough, sore throat, chest pain, shortness of breath, abdominal pain, changes in bowel or bladder habits, etc...   Reviewed expectations re: course of  current medical issues. Questions answered. Outlined signs and symptoms indicating need for more acute intervention. Patient verbalized understanding. After Visit Summary given.         Lestine Box, PA-C 12/09/20 1113

## 2020-12-09 NOTE — Discharge Instructions (Addendum)
Rest and push fluids Tessalon for cough Prednisone prescribed.  Use daily for symptomatic relief Augmentin prescribed.  Wait 2-3 days before filling medication as your symptoms are most likely viral at this time. Take as directed and to completion Continue with OTC ibuprofen/tylenol as needed for pain Follow up with PCP or [facility] if symptoms persists Return or go to the ED if you have any new or worsening symptoms such as fever, chills, worsening sinus pain/pressure, cough, sore throat, chest pain, shortness of breath, abdominal pain, changes in bowel or bladder habits, etc..Marland Kitchen

## 2020-12-09 NOTE — ED Triage Notes (Signed)
Congestion and sinus pressure for 2 days.

## 2020-12-10 DIAGNOSIS — R7303 Prediabetes: Secondary | ICD-10-CM | POA: Diagnosis not present

## 2020-12-10 DIAGNOSIS — I10 Essential (primary) hypertension: Secondary | ICD-10-CM | POA: Diagnosis not present

## 2020-12-10 DIAGNOSIS — E785 Hyperlipidemia, unspecified: Secondary | ICD-10-CM | POA: Diagnosis not present

## 2020-12-11 LAB — CMP14+EGFR
ALT: 30 IU/L (ref 0–32)
AST: 24 IU/L (ref 0–40)
Albumin/Globulin Ratio: 1.4 (ref 1.2–2.2)
Albumin: 4.3 g/dL (ref 3.8–4.8)
Alkaline Phosphatase: 109 IU/L (ref 44–121)
BUN/Creatinine Ratio: 9 — ABNORMAL LOW (ref 12–28)
BUN: 9 mg/dL (ref 8–27)
Bilirubin Total: 0.3 mg/dL (ref 0.0–1.2)
CO2: 22 mmol/L (ref 20–29)
Calcium: 9.1 mg/dL (ref 8.7–10.3)
Chloride: 103 mmol/L (ref 96–106)
Creatinine, Ser: 0.99 mg/dL (ref 0.57–1.00)
Globulin, Total: 3.1 g/dL (ref 1.5–4.5)
Glucose: 102 mg/dL — ABNORMAL HIGH (ref 65–99)
Potassium: 3.3 mmol/L — ABNORMAL LOW (ref 3.5–5.2)
Sodium: 143 mmol/L (ref 134–144)
Total Protein: 7.4 g/dL (ref 6.0–8.5)
eGFR: 62 mL/min/{1.73_m2} (ref >59)

## 2020-12-11 LAB — HEMOGLOBIN A1C
Est. average glucose Bld gHb Est-mCnc: 137 mg/dL
Hgb A1c MFr Bld: 6.4 % — ABNORMAL HIGH (ref 4.8–5.6)

## 2020-12-11 LAB — LIPID PANEL
Chol/HDL Ratio: 2.8 ratio (ref 0.0–4.4)
Cholesterol, Total: 225 mg/dL — ABNORMAL HIGH (ref 100–199)
HDL: 81 mg/dL (ref >39)
LDL Chol Calc (NIH): 130 mg/dL — ABNORMAL HIGH (ref 0–99)
Triglycerides: 78 mg/dL (ref 0–149)
VLDL Cholesterol Cal: 14 mg/dL (ref 5–40)

## 2020-12-13 ENCOUNTER — Ambulatory Visit (INDEPENDENT_AMBULATORY_CARE_PROVIDER_SITE_OTHER): Payer: Medicare PPO | Admitting: Family Medicine

## 2020-12-13 ENCOUNTER — Other Ambulatory Visit: Payer: Self-pay

## 2020-12-13 ENCOUNTER — Encounter: Payer: Self-pay | Admitting: Family Medicine

## 2020-12-13 VITALS — BP 124/82 | HR 94 | Resp 15 | Ht 64.0 in | Wt 218.0 lb

## 2020-12-13 DIAGNOSIS — R7303 Prediabetes: Secondary | ICD-10-CM

## 2020-12-13 DIAGNOSIS — I10 Essential (primary) hypertension: Secondary | ICD-10-CM | POA: Diagnosis not present

## 2020-12-13 DIAGNOSIS — F5104 Psychophysiologic insomnia: Secondary | ICD-10-CM

## 2020-12-13 DIAGNOSIS — E785 Hyperlipidemia, unspecified: Secondary | ICD-10-CM

## 2020-12-13 DIAGNOSIS — N898 Other specified noninflammatory disorders of vagina: Secondary | ICD-10-CM

## 2020-12-13 DIAGNOSIS — F418 Other specified anxiety disorders: Secondary | ICD-10-CM | POA: Diagnosis not present

## 2020-12-13 NOTE — Progress Notes (Signed)
Alexis Hurst     MRN: [id number]      DOB: July 23, 1951   HPI Ms. Dimiceli is here for follow up and re-evaluation of chronic medical conditions, medication management and review of any available recent lab and radiology data.  Preventive health is updated, specifically  Cancer screening and Immunization.   Questions or concerns regarding consultations or procedures which the PT has had in the interim are  addressed. The PT denies any adverse reactions to current medications since the last visit.  There are no new concerns.  There are no specific complaints  No regular exercise and working on food choice  ROS Denies recent fever or chills. Denies sinus pressure, nasal congestion, ear pain or sore throat. Denies chest congestion, productive cough or wheezing. Denies chest pains, palpitations and leg swelling Denies abdominal pain, nausea, vomiting,diarrhea or constipation.   Denies dysuria, frequency, hesitancy or incontinence. Denies joint pain, swelling and limitation in mobility. Denies headaches, seizures, numbness, or tingling. Denies uncontrolled  depression, anxiety or insomnia. Denies skin break down or rash.   PE  BP 124/82   Pulse 94   Resp 15   Ht 5\' 4"  (1.626 m)   Wt 218 lb (98.9 kg)   SpO2 95%   BMI 37.42 kg/m   Patient alert and oriented and in no cardiopulmonary distress.  HEENT: No facial asymmetry, EOMI,     Neck supple .  Chest: Clear to auscultation bilaterally.  CVS: S1, S2 no murmurs, no S3.Regular rate.  ABD: Soft non tender.   Ext: No edema  MS: Adequate ROM spine, shoulders, hips and knees.  Skin: Intact, no ulcerations or rash noted.  Psych: Good eye contact, normal affect. Memory intact not anxious or depressed appearing.  CNS: CN 2-12 intact, power,  normal throughout.no focal deficits noted.   Assessment & Plan  Essential hypertension Controlled, no change in medication DASH diet and commitment to daily physical activity for  a minimum of 30 minutes discussed and encouraged, as a part of hypertension management. The importance of attaining a healthy weight is also discussed.  BP/Weight 12/13/2020 12/09/2020 11/16/2020 11/04/2020 10/29/2020 09/23/2020 77/41/2878  Systolic BP 676 720 947 096 283 662 947  Diastolic BP 82 90 72 71 77 80 78  Wt. (Lbs) 218 - 214.8 216 - 216 214  BMI 37.42 - 36.87 37.08 - 37.08 36.73       Depression with anxiety Controlled, no change in medication   Hyperlipidemia LDL goal <100 Uncontrolled Hyperlipidemia:Low fat diet discussed and encouraged.   Lipid Panel  Lab Results  Component Value Date   CHOL 225 (H) 12/10/2020   HDL 81 12/10/2020   LDLCALC 130 (H) 12/10/2020   TRIG 78 12/10/2020   CHOLHDL 2.8 12/10/2020   Uncontrolled     Morbid obesity ([place])  Patient re-educated about  the importance of commitment to a  minimum of 150 minutes of exercise per week as able.  The importance of healthy food choices with portion control discussed, as well as eating regularly and within a 12 hour window most days. The need to choose "clean , green" food 50 to 75% of the time is discussed, as well as to make water the primary drink and set a goal of 64 ounces water daily.    Weight /BMI 12/13/2020 11/16/2020 11/04/2020  WEIGHT 218 lb 214 lb 12.8 oz 216 lb  HEIGHT 5\' 4"  5\' 4"  5\' 4"   BMI 37.42 kg/m2 36.87 kg/m2 37.08 kg/m2  Vaginal itching improved with topical steroid  Prediabetes Deteriorated Patient educated about the importance of limiting  Carbohydrate intake , the need to commit to daily physical activity for a minimum of 30 minutes , and to commit weight loss. The fact that changes in all these areas will reduce or eliminate all together the development of diabetes is stressed.   Diabetic Labs Latest Ref Rng & Units 12/10/2020 06/15/2020 03/15/2020 01/30/2020 01/23/2020  HbA1c 4.8 - 5.6 % 6.4(H) 6.3(H) - - 5.8  Chol 100 - 199 mg/dL 225(H) - - 158 -  HDL >39 mg/dL 81 - - 71  -  Calc LDL 0 - 99 mg/dL 130(H) - - 71 -  Triglycerides 0 - 149 mg/dL 78 - - 79 -  Creatinine 0.57 - 1.00 mg/dL 0.99 1.03(H) 1.00(H) 0.85 -   BP/Weight 12/13/2020 12/09/2020 11/16/2020 11/04/2020 10/29/2020 09/23/2020 22/48/2500  Systolic BP 370 488 891 694 503 888 280  Diastolic BP 82 90 72 71 77 80 78  Wt. (Lbs) 218 - 214.8 216 - 216 214  BMI 37.42 - 36.87 37.08 - 37.08 36.73   Foot/eye exam completion dates Latest Ref Rng & Units 01/19/2020 11/18/2019  Eye Exam No Retinopathy Retinopathy(A) No Retinopathy  Foot Form Completion - - -      Insomnia Controlled, no change in medication Sleep hygiene reviewed and written information offered also. Prescription sent for  medication needed.

## 2020-12-13 NOTE — Assessment & Plan Note (Signed)
Controlled, no change in medication Sleep hygiene reviewed and written information offered also. Prescription sent for  medication needed.  

## 2020-12-13 NOTE — Assessment & Plan Note (Signed)
Controlled, no change in medication DASH diet and commitment to daily physical activity for a minimum of 30 minutes discussed and encouraged, as a part of hypertension management. The importance of attaining a healthy weight is also discussed.  BP/Weight 12/13/2020 12/09/2020 11/16/2020 11/04/2020 10/29/2020 09/23/2020 35/70/1779  Systolic BP 390 300 923 300 762 263 335  Diastolic BP 82 90 72 71 77 80 78  Wt. (Lbs) 218 - 214.8 216 - 216 214  BMI 37.42 - 36.87 37.08 - 37.08 36.73

## 2020-12-13 NOTE — Assessment & Plan Note (Signed)
Uncontrolled Hyperlipidemia:Low fat diet discussed and encouraged.   Lipid Panel  Lab Results  Component Value Date   CHOL 225 (H) 12/10/2020   HDL 81 12/10/2020   LDLCALC 130 (H) 12/10/2020   TRIG 78 12/10/2020   CHOLHDL 2.8 12/10/2020   Uncontrolled

## 2020-12-13 NOTE — Assessment & Plan Note (Signed)
Controlled, no change in medication  

## 2020-12-13 NOTE — Assessment & Plan Note (Signed)
  Patient re-educated about  the importance of commitment to a  minimum of 150 minutes of exercise per week as able.  The importance of healthy food choices with portion control discussed, as well as eating regularly and within a 12 hour window most days. The need to choose "clean , green" food 50 to 75% of the time is discussed, as well as to make water the primary drink and set a goal of 64 ounces water daily.    Weight /BMI 12/13/2020 11/16/2020 11/04/2020  WEIGHT 218 lb 214 lb 12.8 oz 216 lb  HEIGHT 5\' 4"  5\' 4"  5\' 4"   BMI 37.42 kg/m2 36.87 kg/m2 37.08 kg/m2

## 2020-12-13 NOTE — Assessment & Plan Note (Signed)
improved with topical steroid

## 2020-12-13 NOTE — Assessment & Plan Note (Signed)
Deteriorated Patient educated about the importance of limiting  Carbohydrate intake , the need to commit to daily physical activity for a minimum of 30 minutes , and to commit weight loss. The fact that changes in all these areas will reduce or eliminate all together the development of diabetes is stressed.   Diabetic Labs Latest Ref Rng & Units 12/10/2020 06/15/2020 03/15/2020 01/30/2020 01/23/2020  HbA1c 4.8 - 5.6 % 6.4(H) 6.3(H) - - 5.8  Chol 100 - 199 mg/dL 225(H) - - 158 -  HDL >39 mg/dL 81 - - 71 -  Calc LDL 0 - 99 mg/dL 130(H) - - 71 -  Triglycerides 0 - 149 mg/dL 78 - - 79 -  Creatinine 0.57 - 1.00 mg/dL 0.99 1.03(H) 1.00(H) 0.85 -   BP/Weight 12/13/2020 12/09/2020 11/16/2020 11/04/2020 10/29/2020 09/23/2020 45/99/7741  Systolic BP 423 953 202 334 356 861 683  Diastolic BP 82 90 72 71 77 80 78  Wt. (Lbs) 218 - 214.8 216 - 216 214  BMI 37.42 - 36.87 37.08 - 37.08 36.73   Foot/eye exam completion dates Latest Ref Rng & Units 01/19/2020 11/18/2019  Eye Exam No Retinopathy Retinopathy(A) No Retinopathy  Foot Form Completion - - -

## 2020-12-13 NOTE — Patient Instructions (Signed)
F/U in office end August , flu vaccine at visit, call if you need me before   Fasting lipid, cmp and rGFr and hBA1C 3 days BEFORE NEXT APPOINTMENT  PLEASE SCHEDULE SCREENING MAMMOGRAM AT [facility] AT CHECKOUT  Work on change in food and drink  choice to lower blood sugar and cholesterol please  It is important that you exercise regularly at least 30 minutes 5 times a week. If you develop chest pain, have severe difficulty breathing, or feel very tired, stop exercising immediately and seek medical attention   Thanks for choosing [facility], we consider it a privelige to serve you.

## 2020-12-14 ENCOUNTER — Other Ambulatory Visit: Payer: Self-pay | Admitting: Family Medicine

## 2020-12-14 ENCOUNTER — Other Ambulatory Visit: Payer: Self-pay

## 2020-12-14 DIAGNOSIS — Z1231 Encounter for screening mammogram for malignant neoplasm of breast: Secondary | ICD-10-CM

## 2021-01-07 ENCOUNTER — Other Ambulatory Visit: Payer: Self-pay | Admitting: Family Medicine

## 2021-01-18 DIAGNOSIS — R7309 Other abnormal glucose: Secondary | ICD-10-CM | POA: Diagnosis not present

## 2021-01-18 DIAGNOSIS — H35363 Drusen (degenerative) of macula, bilateral: Secondary | ICD-10-CM | POA: Diagnosis not present

## 2021-01-18 DIAGNOSIS — H3562 Retinal hemorrhage, left eye: Secondary | ICD-10-CM | POA: Diagnosis not present

## 2021-01-18 DIAGNOSIS — H04123 Dry eye syndrome of bilateral lacrimal glands: Secondary | ICD-10-CM | POA: Diagnosis not present

## 2021-01-18 DIAGNOSIS — H35033 Hypertensive retinopathy, bilateral: Secondary | ICD-10-CM | POA: Diagnosis not present

## 2021-01-18 LAB — HM DIABETES EYE EXAM

## 2021-01-25 ENCOUNTER — Other Ambulatory Visit: Payer: Self-pay | Admitting: Family Medicine

## 2021-02-15 NOTE — Progress Notes (Signed)
Cardiology Office Note    Date:  02/16/2021   ID:  Alexis, Hurst [birth year], MRN [id number]  PCP:  Fayrene Helper, MD  Cardiologist: Rozann Lesches, MD    Chief Complaint  Patient presents with   Follow-up    3 month visit    History of Present Illness:    Alexis Hurst is a 70 y.o. female with past medical history of palpitations (PAC's, PVC's and brief NSVT by prior monitor in 09/2020), HTN, HLD and Type 2 DM who presents to the office today for 58-month follow-up.   She was last examined by myself in 11/2020 and reported occasional palpitations occurring a few times each week and denied any associated symptoms. She was already taking Coreg 6.25mg  BID and this was titrated to 9.375mg  BID and she was encouraged to check her HR/BP at home.   In talking with the patient today, she reports overall doing well from a cardiac perspective since her last visit. She does report her 59-month-old grandchild was hospitalized for RSV for over 2 months and she was keeping her 55-year-old grandson while their parents were at the hospital. She reports he kept her active during that timeframe. She does report her palpitations have improved since the dose titration of Coreg. Denies any exertional chest pain or dyspnea on exertion. She has noticed some intermittent lower extremity edema and in reviewing her medication list she is currently on Amlodipine 10 mg daily. No associated orthopnea or PND. Does report postnasal drip at night.   Past Medical History:  Diagnosis Date   Allergic sinusitis 12/18/2012   Allergic urticaria 04/02/2017   Anxiety    COVID-19 virus infection 09/05/2019   Depression    Diverticulosis 12/11/2013   Colonoscopy in 2014 per Dr Oneida Alar, also has small hemmorhoids    Dizziness    Mild Orthostatic   Essential hypertension    Fatigue    History of chest pain    Negative stress echo 2010   Hyperlipidemia    Insomnia    Lab test positive for detection of  COVID-19 virus 09/24/2019   Mitral valve prolapse    Non-allergic rhinitis 10/31/2017   Obesity    Paresthesias    Upper extremity /episode sensation of coldness and numbness    Piles (hemorrhoids) 07/20/2015   Raynauds phenomenon    Possible   Type 2 diabetes mellitus ([place])     Past Surgical History:  Procedure Laterality Date   ABDOMINAL HYSTERECTOMY     BREAST EXCISIONAL BIOPSY Left    BREAST SURGERY     CATARACT EXTRACTION Right 02/03/2020   CATARACT EXTRACTION Left 02/09/2020   COLONOSCOPY  06/11/2003   Smith:multiple medium scatered diverticula in the cecum, a sending colon, transverse colon, descending colon, sigmoid colon.   COLONOSCOPY N/A 05/20/2013   TICS, SML IH   Cystectomy L breast, benign     VESICOVAGINAL FISTULA CLOSURE W/ TAH  1989    Current Medications: Outpatient Medications Prior to Visit  Medication Sig Dispense Refill   albuterol (VENTOLIN HFA) 108 (90 Base) MCG/ACT inhaler Inhale 2 puffs into the lungs every 6 (six) hours as needed for wheezing or shortness of breath. 8 g 6   aspirin 81 MG EC tablet Take 81 mg by mouth daily.     azelastine (ASTELIN) 0.1 % nasal spray Place 2 sprays into both nostrils 2 (two) times daily. Use in each nostril as directed (Patient taking differently: Place 2 sprays into both nostrils as needed. Use  in each nostril as directed) 30 mL 12   benzonatate (TESSALON) 100 MG capsule Take 1 capsule (100 mg total) by mouth every 8 (eight) hours. 21 capsule 0   Calcium Carbonate-Vitamin D 600-200 MG-UNIT TABS Take 1 tablet by mouth daily.     carvedilol (COREG) 6.25 MG tablet Take 1.5 tablets (9.375 mg total) by mouth 2 (two) times daily with a meal. 360 tablet 2   clobetasol ointment (TEMOVATE) 0.05 % Apply bid to affected area for 2 weeks then 3 x a week 45 g 3   cloNIDine (CATAPRES) 0.2 MG tablet TAKE 1 TABLET BY MOUTH EVERY DAY 90 tablet 2   FLUoxetine (PROZAC) 40 MG capsule TAKE 1 CAPSULE(40 MG) BY MOUTH DAILY 30 capsule 5    fluticasone (FLONASE) 50 MCG/ACT nasal spray Place 1 spray into both nostrils daily for 14 days. 16 g 0   lidocaine (XYLOCAINE) 2 % solution Use as directed 15 mLs in the mouth or throat every 6 (six) hours as needed for mouth pain. 100 mL 0   montelukast (SINGULAIR) 10 MG tablet TAKE 1 TABLET(10 MG) BY MOUTH AT BEDTIME 90 tablet 0   pantoprazole (PROTONIX) 40 MG tablet TAKE 1 TABLET(40 MG) BY MOUTH TWICE DAILY BEFORE A MEAL 180 tablet 3   spironolactone (ALDACTONE) 25 MG tablet TAKE 1 TABLET(25 MG) BY MOUTH DAILY 90 tablet 3   Tiotropium Bromide Monohydrate (SPIRIVA RESPIMAT) 1.25 MCG/ACT AERS Inhale 2 puffs into the lungs daily. (Patient taking differently: Inhale 2 puffs into the lungs as needed.) 4 g 0   traZODone (DESYREL) 50 MG tablet Take 0.5-1 tablets (25-50 mg total) by mouth at bedtime as needed for sleep. 90 tablet 1   venlafaxine XR (EFFEXOR-XR) 75 MG 24 hr capsule TAKE 1 CAPSULE(75 MG) BY MOUTH DAILY WITH BREAKFAST 90 capsule 1   Vitamin D, Ergocalciferol, (DRISDOL) 1.25 MG (50000 UNIT) CAPS capsule TAKE 1 CAPSULE BY MOUTH ONCE A WEEK 12 capsule 1   amLODipine (NORVASC) 5 MG tablet TAKE 1 TABLET(5 MG) BY MOUTH DAILY 90 tablet 3   amLODipine (NORVASC) 10 MG tablet TAKE 1 TABLET BY MOUTH EVERY DAY FOR BLOOD PRESSURE (Patient not taking: Reported on 02/16/2021) 30 tablet 3   conjugated estrogens (PREMARIN) vaginal cream Place 1 Applicatorful vaginally daily. (Patient taking differently: Place 1 Applicatorful vaginally as needed.) 42.5 g 12   nystatin-triamcinolone ointment (MYCOLOG) Apply to affected area twice daily for 1 week, then as needed 60 g 0   No facility-administered medications prior to visit.     Allergies:   Olmesartan and Lactose intolerance (gi)   Social History   Socioeconomic History   Marital status: Married    Spouse name: Not on file   Number of children: 2   Years of education: Not on file   Highest education level: Not on file  Occupational History    Occupation: teacher   Tobacco Use   Smoking status: Former    Packs/day: 0.50    Years: 16.00    Pack years: 8.00    Types: Cigarettes    Start date: 80    Quit date: 09/04/1986    Years since quitting: 34.4   Smokeless tobacco: Never  Vaping Use   Vaping Use: Never used  Substance and Sexual Activity   Alcohol use: Yes    Alcohol/week: 0.0 standard drinks    Comment: occ   Drug use: No   Sexual activity: Not Currently    Birth control/protection: Surgical    Comment: hyst  Other Topics Concern   Not on file  Social History Narrative   Not on file   Social Determinants of Health   Financial Resource Strain: Low Risk    Difficulty of Paying Living Expenses: Not hard at all  Food Insecurity: No Food Insecurity   Worried About Charity fundraiser in the Last Year: Never true   [place] in the Last Year: Never true  Transportation Needs: No Transportation Needs   Lack of Transportation (Medical): No   Lack of Transportation (Non-Medical): No  Physical Activity: Inactive   Days of Exercise per Week: 0 days   Minutes of Exercise per Session: 0 min  Stress: Stress Concern Present   Feeling of Stress : To some extent  Social Connections: Engineer, building services of Communication with Friends and Family: More than three times a week   Frequency of Social Gatherings with Friends and Family: More than three times a week   Attends Religious Services: More than 4 times per year   Active Member of [employer] or Organizations: Yes   Attends Archivist Meetings: 1 to 4 times per year   Marital Status: Married     Family History:  The patient's family history includes Asthma in her sister; Breast cancer in her daughter; Colon cancer in her maternal grandfather; Crohn's disease in her brother; Diabetes in her brother; Heart attack in her mother; Heart disease in her father and sister; Heart failure in her father; Hypertension in her brother, mother, sister, and  sister; Lung cancer in her father; Urticaria in her mother.   Review of Systems:    Please see the history of present illness.     All other systems reviewed and are otherwise negative except as noted above.   Physical Exam:    VS:  BP 112/66   Pulse 92   Ht 5\' 4"  (1.626 m)   Wt 219 lb (99.3 kg)   SpO2 98%   BMI 37.59 kg/m    General: Well developed, well nourished,female appearing in no acute distress. Head: Normocephalic, atraumatic. Neck: No carotid bruits. JVD not elevated.  Lungs: Respirations regular and unlabored, without wheezes or rales.  Heart: Regular rate and rhythm. No S3 or S4.  No murmur, no rubs, or gallops appreciated. Abdomen: Appears non-distended. No obvious abdominal masses. Msk:  Strength and tone appear normal for age. No obvious joint deformities or effusions. Extremities: No clubbing or cyanosis. Trace lower extremity edema.  Distal pedal pulses are 2+ bilaterally. Neuro: Alert and oriented X 3. Moves all extremities spontaneously. No focal deficits noted. Psych:  Responds to questions appropriately with a normal affect. Skin: No rashes or lesions noted  Wt Readings from Last 3 Encounters:  02/16/21 219 lb (99.3 kg)  12/13/20 218 lb (98.9 kg)  11/16/20 214 lb 12.8 oz (97.4 kg)     Studies/Labs Reviewed:   EKG:  EKG is not ordered today.    Recent Labs: 06/15/2020: TSH 1.390 12/10/2020: ALT 30; BUN 9; Creatinine, Ser 0.99; Potassium 3.3; Sodium 143   Lipid Panel    Component Value Date/Time   CHOL 225 (H) 12/10/2020 1135   TRIG 78 12/10/2020 1135   HDL 81 12/10/2020 1135   CHOLHDL 2.8 12/10/2020 1135   CHOLHDL 2.2 01/30/2020 1121   VLDL 19 03/28/2017 1023   LDLCALC 130 (H) 12/10/2020 1135   LDLCALC 71 01/30/2020 1121    Additional studies/ records that were reviewed today include:   NST: 01/2019  No diagnostic ST changes to indicate ischemia. Small, mild intensity, partially reversible apical anterior defect that is consistent with  breast attenuation. This is a low risk study. Nuclear stress EF: 75%   Echocardiogram: 01/2019 IMPRESSIONS     1. The left ventricle has normal systolic function with an ejection  fraction of 60-65%. The cavity size was normal. There is moderately  increased left ventricular wall thickness. Left ventricular diastolic  Doppler parameters are consistent with impaired   relaxation. No evidence of left ventricular regional wall motion  abnormalities.   2. The right ventricle has normal systolic function. The cavity was  normal. There is no increase in right ventricular wall thickness. Right  ventricular systolic pressure normal with an estimated pressure of 27.6  mmHg.   3. Trivial pericardial effusion is present.   4. The aortic valve is tricuspid. Mild aortic annular calcification  noted.   5. The mitral valve is grossly normal. There is mild mitral annular  calcification present.   6. The tricuspid valve is grossly normal.   7. The aortic root is normal in size and structure.    Event Monitor: 09/2020 [employer] monitor reviewed, 13 days 8 hours analyzed.  Predominant rhythm is sinus with heart rate ranging from 77 bpm up to 146 bpm and average heart rate 96 bpm.  Rare PACs were noted representing less than 1% total beats.  Rare PVCs were noted representing less than 1% total beats.  3 beat episode of NSVT noted.  Patient triggered event corresponded to normal sinus rhythm.  There were no sustained arrhythmias or pauses.  Assessment:    1. Palpitations   2. Essential hypertension   3. Bilateral lower extremity edema   4. Mixed hyperlipidemia      Plan:   In order of problems listed above:  1. Palpitations - Her prior monitor in 09/2020 showed rare PAC's, PVC's and brief NSVT. She reports her symptoms have overall been well-controlled since prior dose titration of her BB. Continue Coreg 9.375 mg twice daily.  2. HTN - Her blood pressure is well controlled at 112/66  during today's visit. Continue Coreg 9.375 mg twice daily, Clonidine 0.2 mg daily and Spironolactone 25 mg daily. Will reduce Amlodipine from 10 mg daily to 5 mg daily to see if this helps with her edema as outlined below.  3. Lower Extremity Edema - She does report intermittent lower extremity edema but denies any associated respiratory changes or weight gain. Her symptoms are possibly due to Amlodipine as she is on 10 mg daily. I encouraged her to reduce this to 5 mg daily and see if her symptoms improve. Also encouraged her to follow BP at home given dose reduction of this. Reviewed the importance of monitoring sodium intake and elevating her lower extremities as able.    4. HLD - Followed by her PCP. LDL was previuously at 71 in 01/2020 but elevated to 130 in 12/2020 with total cholesterol at 225. She wished to focus on dietary changes at that time and will be readdressed at follow-up.    Medication Adjustments/Labs and Tests Ordered: Current medicines are reviewed at length with the patient today.  Concerns regarding medicines are outlined above.  Medication changes, Labs and Tests ordered today are listed in the Patient Instructions below. Patient Instructions  Medication Instructions:  DECREASE Amlodipine to 5 mg daily   *If you need a refill on your cardiac medications before your next appointment, please call your pharmacy*   Lab Work: None  today  If you have labs (blood work) drawn today and your tests are completely normal, you will receive your results only by: [place] (if you have MyChart) OR A paper copy in the mail If you have any lab test that is abnormal or we need to change your treatment, we will call you to review the results.   Testing/Procedures: None today    Follow-Up: At [facility], you and your health needs are our priority.  As part of our continuing mission to provide you with exceptional heart care, we have created designated Provider Care  Teams.  These Care Teams include your primary Cardiologist (physician) and Advanced Practice Providers (APPs -  Physician Assistants and Nurse Practitioners) who all work together to provide you with the care you need, when you need it.  We recommend signing up for the patient portal called "MyChart".  Sign up information is provided on this After Visit Summary.  MyChart is used to connect with patients for Virtual Visits (Telemedicine).  Patients are able to view lab/test results, encounter notes, upcoming appointments, etc.  Non-urgent messages can be sent to your provider as well.   To learn more about what you can do with MyChart, go to [email or web address].    Your next appointment:   6 month(s)  The format for your next appointment:   In Person  Provider:   You may see Rozann Lesches, MD or one of the following Advanced Practice Providers on your designated Care Team:   Bernerd Pho, PA-C     Other Instructions None    Signed, Waynetta Pean  02/16/2021 4:35 PM    [facility]. [address] [place], [place] [address] Phone: [phone] Fax: [phone]

## 2021-02-16 ENCOUNTER — Ambulatory Visit (INDEPENDENT_AMBULATORY_CARE_PROVIDER_SITE_OTHER): Payer: Medicare PPO | Admitting: Student

## 2021-02-16 ENCOUNTER — Other Ambulatory Visit: Payer: Self-pay

## 2021-02-16 ENCOUNTER — Encounter: Payer: Self-pay | Admitting: Student

## 2021-02-16 VITALS — BP 112/66 | HR 92 | Ht 64.0 in | Wt 219.0 lb

## 2021-02-16 DIAGNOSIS — I10 Essential (primary) hypertension: Secondary | ICD-10-CM | POA: Diagnosis not present

## 2021-02-16 DIAGNOSIS — E782 Mixed hyperlipidemia: Secondary | ICD-10-CM

## 2021-02-16 DIAGNOSIS — R002 Palpitations: Secondary | ICD-10-CM | POA: Diagnosis not present

## 2021-02-16 DIAGNOSIS — R600 Localized edema: Secondary | ICD-10-CM | POA: Diagnosis not present

## 2021-02-16 MED ORDER — AMLODIPINE BESYLATE 5 MG PO TABS: 5.0000 mg | ORAL_TABLET | Freq: Every day | ORAL | 3 refills | Status: DC

## 2021-02-16 NOTE — Patient Instructions (Signed)
Medication Instructions:  DECREASE Amlodipine to 5 mg daily   *If you need a refill on your cardiac medications before your next appointment, please call your pharmacy*   Lab Work: None today  If you have labs (blood work) drawn today and your tests are completely normal, you will receive your results only by: [place] (if you have MyChart) OR A paper copy in the mail If you have any lab test that is abnormal or we need to change your treatment, we will call you to review the results.   Testing/Procedures: None today    Follow-Up: At [facility], you and your health needs are our priority.  As part of our continuing mission to provide you with exceptional heart care, we have created designated Provider Care Teams.  These Care Teams include your primary Cardiologist (physician) and Advanced Practice Providers (APPs -  Physician Assistants and Nurse Practitioners) who all work together to provide you with the care you need, when you need it.  We recommend signing up for the patient portal called "MyChart".  Sign up information is provided on this After Visit Summary.  MyChart is used to connect with patients for Virtual Visits (Telemedicine).  Patients are able to view lab/test results, encounter notes, upcoming appointments, etc.  Non-urgent messages can be sent to your provider as well.   To learn more about what you can do with MyChart, go to [email or web address].    Your next appointment:   6 month(s)  The format for your next appointment:   In Person  Provider:   You may see Rozann Lesches, MD or one of the following Advanced Practice Providers on your designated Care Team:   Bernerd Pho, PA-C     Other Instructions None

## 2021-03-01 ENCOUNTER — Encounter: Payer: Self-pay | Admitting: Internal Medicine

## 2021-03-30 ENCOUNTER — Other Ambulatory Visit: Payer: Self-pay | Admitting: Family Medicine

## 2021-04-09 ENCOUNTER — Other Ambulatory Visit: Payer: Self-pay | Admitting: Nurse Practitioner

## 2021-04-09 DIAGNOSIS — K219 Gastro-esophageal reflux disease without esophagitis: Secondary | ICD-10-CM

## 2021-04-25 ENCOUNTER — Ambulatory Visit: Payer: Medicare PPO | Admitting: Family Medicine

## 2021-05-01 ENCOUNTER — Other Ambulatory Visit: Payer: Self-pay | Admitting: Family Medicine

## 2021-05-06 ENCOUNTER — Other Ambulatory Visit: Payer: Self-pay | Admitting: Family Medicine

## 2021-05-13 ENCOUNTER — Ambulatory Visit (INDEPENDENT_AMBULATORY_CARE_PROVIDER_SITE_OTHER): Payer: Medicare PPO | Admitting: Family Medicine

## 2021-05-13 ENCOUNTER — Other Ambulatory Visit: Payer: Self-pay

## 2021-05-13 ENCOUNTER — Encounter: Payer: Self-pay | Admitting: Family Medicine

## 2021-05-13 VITALS — BP 138/76 | Ht 64.0 in | Wt 220.0 lb

## 2021-05-13 DIAGNOSIS — F5104 Psychophysiologic insomnia: Secondary | ICD-10-CM

## 2021-05-13 DIAGNOSIS — Z23 Encounter for immunization: Secondary | ICD-10-CM

## 2021-05-13 DIAGNOSIS — R002 Palpitations: Secondary | ICD-10-CM

## 2021-05-13 DIAGNOSIS — E785 Hyperlipidemia, unspecified: Secondary | ICD-10-CM

## 2021-05-13 DIAGNOSIS — F418 Other specified anxiety disorders: Secondary | ICD-10-CM | POA: Diagnosis not present

## 2021-05-13 DIAGNOSIS — I10 Essential (primary) hypertension: Secondary | ICD-10-CM

## 2021-05-13 DIAGNOSIS — L900 Lichen sclerosus et atrophicus: Secondary | ICD-10-CM

## 2021-05-13 DIAGNOSIS — R7303 Prediabetes: Secondary | ICD-10-CM | POA: Diagnosis not present

## 2021-05-13 NOTE — Patient Instructions (Addendum)
Annual physical exam in December, call if you need me before  Flu vaccine today  Covid booster is due   Shingrix vaccines please get as well..   Will contact you via 'My chart" re labs and plan if able  It is important that you exercise regularly at least 30 minutes 5 times a week. If you develop chest pain, have severe difficulty breathing, or feel very tired, stop exercising immediately and seek medical attention   Think about what you will eat, plan ahead. Choose " clean, green, fresh or frozen" over canned, processed or packaged foods which are more sugary, salty and fatty. 70 to 75% of food eaten should be vegetables and fruit. Three meals at set times with snacks allowed between meals, but they must be fruit or vegetables. Aim to eat over a 12 hour period , example 7 am to 7 pm, and STOP after  your last meal of the day. Drink water,generally about 64 ounces per day, no other drink is as healthy. Fruit juice is best enjoyed in a healthy way, by EATING the fruit.  Thanks for choosing [facility], we consider it a privelige to serve you.

## 2021-05-14 LAB — CMP14+EGFR
ALT: 47 IU/L — ABNORMAL HIGH (ref 0–32)
AST: 35 IU/L (ref 0–40)
Albumin/Globulin Ratio: 1.2 (ref 1.2–2.2)
Albumin: 3.9 g/dL (ref 3.8–4.8)
Alkaline Phosphatase: 113 IU/L (ref 44–121)
BUN/Creatinine Ratio: 14 (ref 12–28)
BUN: 12 mg/dL (ref 8–27)
Bilirubin Total: 0.2 mg/dL (ref 0.0–1.2)
CO2: 23 mmol/L (ref 20–29)
Calcium: 9.2 mg/dL (ref 8.7–10.3)
Chloride: 102 mmol/L (ref 96–106)
Creatinine, Ser: 0.86 mg/dL (ref 0.57–1.00)
Globulin, Total: 3.3 g/dL (ref 1.5–4.5)
Glucose: 113 mg/dL — ABNORMAL HIGH (ref 65–99)
Potassium: 4.3 mmol/L (ref 3.5–5.2)
Sodium: 137 mmol/L (ref 134–144)
Total Protein: 7.2 g/dL (ref 6.0–8.5)
eGFR: 74 mL/min/{1.73_m2} (ref >59)

## 2021-05-14 LAB — LIPID PANEL
Chol/HDL Ratio: 3.2 ratio (ref 0.0–4.4)
Cholesterol, Total: 227 mg/dL — ABNORMAL HIGH (ref 100–199)
HDL: 71 mg/dL (ref >39)
LDL Chol Calc (NIH): 138 mg/dL — ABNORMAL HIGH (ref 0–99)
Triglycerides: 104 mg/dL (ref 0–149)
VLDL Cholesterol Cal: 18 mg/dL (ref 5–40)

## 2021-05-14 LAB — HEMOGLOBIN A1C
Est. average glucose Bld gHb Est-mCnc: 137 mg/dL
Hgb A1c MFr Bld: 6.4 % — ABNORMAL HIGH (ref 4.8–5.6)

## 2021-05-15 ENCOUNTER — Encounter: Payer: Self-pay | Admitting: Family Medicine

## 2021-05-15 NOTE — Assessment & Plan Note (Signed)
Hyperlipidemia:Low fat diet discussed and encouraged.   Lipid Panel  Lab Results  Component Value Date   CHOL 227 (H) 05/13/2021   HDL 71 05/13/2021   LDLCALC 138 (H) 05/13/2021   TRIG 104 05/13/2021   CHOLHDL 3.2 05/13/2021     Deteriorated, needs to lower fat inake, consider statin use if persists

## 2021-05-15 NOTE — Assessment & Plan Note (Signed)
Controlled, no change in medication DASH diet and commitment to daily physical activity for a minimum of 30 minutes discussed and encouraged, as a part of hypertension management. The importance of attaining a healthy weight is also discussed.  BP/Weight 05/13/2021 02/16/2021 12/13/2020 12/09/2020 11/16/2020 11/04/2020 [id number]  Systolic BP 0000000 [id number] 0000000 [id number]  Diastolic BP 76 66 82 90 72 71 77  Wt. (Lbs) 220 219 218 - 214.8 216 -  BMI 37.76 37.59 37.42 - 36.87 37.08 -

## 2021-05-15 NOTE — Assessment & Plan Note (Signed)
Sleep hygiene reviewed and written information offered also. Prescription sent for  medication needed.  

## 2021-05-15 NOTE — Assessment & Plan Note (Signed)
  Patient re-educated about  the importance of commitment to a  minimum of 150 minutes of exercise per week as able.  The importance of healthy food choices with portion control discussed, as well as eating regularly and within a 12 hour window most days. The need to choose "clean , green" food 50 to 75% of the time is discussed, as well as to make water the primary drink and set a goal of 64 ounces water daily.    Weight /BMI 05/13/2021 02/16/2021 12/13/2020  WEIGHT 220 lb 219 lb 218 lb  HEIGHT '5\' 4"'$  '5\' 4"'$  '5\' 4"'$   BMI 37.76 kg/m2 37.59 kg/m2 37.42 kg/m2

## 2021-05-15 NOTE — Assessment & Plan Note (Signed)
Controlled, no change in medication  

## 2021-05-15 NOTE — Assessment & Plan Note (Signed)
Patient educated about the importance of limiting  Carbohydrate intake , the need to commit to daily physical activity for a minimum of 30 minutes , and to commit weight loss. The fact that changes in all these areas will reduce or eliminate all together the development of diabetes is stressed.   Diabetic Labs Latest Ref Rng & Units 05/13/2021 12/10/2020 06/15/2020 03/15/2020 01/30/2020  HbA1c 4.8 - 5.6 % 6.4(H) 6.4(H) 6.3(H) - -  Chol 100 - 199 mg/dL 227(H) 225(H) - - 158  HDL >39 mg/dL 71 81 - - 71  Calc LDL 0 - 99 mg/dL 138(H) 130(H) - - 71  Triglycerides 0 - 149 mg/dL 104 78 - - 79  Creatinine 0.57 - 1.00 mg/dL 0.86 0.99 1.03(H) 1.00(H) 0.85   BP/Weight 05/13/2021 02/16/2021 12/13/2020 12/09/2020 11/16/2020 11/04/2020 [id number]  Systolic BP 0000000 [id number] 0000000 [id number]  Diastolic BP 76 66 82 90 72 71 77  Wt. (Lbs) 220 219 218 - 214.8 216 -  BMI 37.76 37.59 37.42 - 36.87 37.08 -   Foot/eye exam completion dates Latest Ref Rng & Units 01/18/2021 01/19/2020  Eye Exam No Retinopathy No Retinopathy Retinopathy(A)  Foot Form Completion - - -

## 2021-05-15 NOTE — Progress Notes (Signed)
Alexis Hurst     MRN: [id number]      DOB: 04/09/1951   HPI Ms. Puliafico is here for follow up and re-evaluation of chronic medical conditions, medication management and review of any available recent lab and radiology data.  Preventive health is updated, specifically  Cancer screening and Immunization.   Questions or concerns regarding consultations or procedures which the PT has had in the interim are  addressed. The PT denies any adverse reactions to current medications since the last visit.  C/o stress caring for family, no end in sight, no time for herself C/o weight gain, no regular exercise, overindulgence in sweet drinks, pepsi and tea   ROS Denies recent fever or chills. Denies sinus pressure, nasal congestion, ear pain or sore throat. Denies chest congestion, productive cough or wheezing. Denies chest pains, palpitations and leg swelling Denies abdominal pain, nausea, vomiting,diarrhea or constipation.   Denies dysuria, frequency, hesitancy or incontinence. C.o  joint pain, swelling and limitation in mobility. Denies headaches, seizures, numbness, or tingling.  Denies skin break down or rash.   PE  BP 138/76   Ht '5\' 4"'$  (1.626 m)   Wt 220 lb (99.8 kg)   BMI 37.76 kg/m   Patient alert and oriented and in no cardiopulmonary distress.  HEENT: No facial asymmetry, EOMI,     Neck supple .  Chest: Clear to auscultation bilaterally.  CVS: S1, S2 no murmurs, no S3.Regular rate.  ABD: Soft non tender.   Ext: No edema  MS: Adequate though reduced ROM spine, shoulders, hips and knees.  Skin: Intact, no ulcerations or rash noted.  Psych: Good eye contact, normal affect. Memory intact not anxious or depressed appearing.  CNS: CN 2-12 intact, power,  normal throughout.no focal deficits noted.   Assessment & Plan  Essential hypertension Controlled, no change in medication DASH diet and commitment to daily physical activity for a minimum of 30 minutes  discussed and encouraged, as a part of hypertension management. The importance of attaining a healthy weight is also discussed.  BP/Weight 05/13/2021 02/16/2021 12/13/2020 12/09/2020 11/16/2020 11/04/2020 [id number]  Systolic BP 0000000 [id number] 0000000 [id number]  Diastolic BP 76 66 82 90 72 71 77  Wt. (Lbs) 220 219 218 - 214.8 216 -  BMI 37.76 37.59 37.42 - 36.87 37.08 -       Depression with anxiety Controlled, no change in medication   Lichen sclerosus et atrophicus Controlled, no change in medication   Morbid obesity ([place])  Patient re-educated about  the importance of commitment to a  minimum of 150 minutes of exercise per week as able.  The importance of healthy food choices with portion control discussed, as well as eating regularly and within a 12 hour window most days. The need to choose "clean , green" food 50 to 75% of the time is discussed, as well as to make water the primary drink and set a goal of 64 ounces water daily.    Weight /BMI 05/13/2021 02/16/2021 12/13/2020  WEIGHT 220 lb 219 lb 218 lb  HEIGHT '5\' 4"'$  '5\' 4"'$  '5\' 4"'$   BMI 37.76 kg/m2 37.59 kg/m2 37.42 kg/m2      Prediabetes Patient educated about the importance of limiting  Carbohydrate intake , the need to commit to daily physical activity for a minimum of 30 minutes , and to commit weight loss. The fact that changes in all these areas will reduce or eliminate all together the development of diabetes is stressed.  Diabetic Labs Latest Ref Rng & Units 05/13/2021 12/10/2020 06/15/2020 03/15/2020 01/30/2020  HbA1c 4.8 - 5.6 % 6.4(H) 6.4(H) 6.3(H) - -  Chol 100 - 199 mg/dL 227(H) 225(H) - - 158  HDL >39 mg/dL 71 81 - - 71  Calc LDL 0 - 99 mg/dL 138(H) 130(H) - - 71  Triglycerides 0 - 149 mg/dL 104 78 - - 79  Creatinine 0.57 - 1.00 mg/dL 0.86 0.99 1.03(H) 1.00(H) 0.85   BP/Weight 05/13/2021 02/16/2021 12/13/2020 12/09/2020 11/16/2020 11/04/2020 [id number]  Systolic BP 0000000 [id number] 0000000 [id number]  Diastolic BP 76 66 82 90 72 71 77  Wt.  (Lbs) 220 219 218 - 214.8 216 -  BMI 37.76 37.59 37.42 - 36.87 37.08 -   Foot/eye exam completion dates Latest Ref Rng & Units 01/18/2021 01/19/2020  Eye Exam No Retinopathy No Retinopathy Retinopathy(A)  Foot Form Completion - - -      Hyperlipidemia LDL goal <100 Hyperlipidemia:Low fat diet discussed and encouraged.   Lipid Panel  Lab Results  Component Value Date   CHOL 227 (H) 05/13/2021   HDL 71 05/13/2021   LDLCALC 138 (H) 05/13/2021   TRIG 104 05/13/2021   CHOLHDL 3.2 05/13/2021     Deteriorated, needs to lower fat inake, consider statin use if persists  Insomnia Sleep hygiene reviewed and written information offered also. Prescription sent for  medication needed.   Palpitations Controlled, no change in medication

## 2021-05-24 ENCOUNTER — Other Ambulatory Visit: Payer: Self-pay | Admitting: Family Medicine

## 2021-06-29 ENCOUNTER — Other Ambulatory Visit: Payer: Self-pay

## 2021-06-29 ENCOUNTER — Ambulatory Visit (INDEPENDENT_AMBULATORY_CARE_PROVIDER_SITE_OTHER): Payer: Medicare PPO

## 2021-06-29 VITALS — BP 144/78 | HR 101 | Ht 64.0 in | Wt 222.0 lb

## 2021-06-29 DIAGNOSIS — Z780 Asymptomatic menopausal state: Secondary | ICD-10-CM | POA: Diagnosis not present

## 2021-06-29 DIAGNOSIS — Z13820 Encounter for screening for osteoporosis: Secondary | ICD-10-CM

## 2021-06-29 DIAGNOSIS — Z0000 Encounter for general adult medical examination without abnormal findings: Secondary | ICD-10-CM

## 2021-06-29 NOTE — Progress Notes (Signed)
Subjective:   Alexis Hurst is a 70 y.o. female who presents for Medicare Annual (Subsequent) preventive examination.  Review of Systems     Cardiac Risk Factors include: advanced age (>6men, >31 women);hypertension;obesity (BMI >30kg/m2);sedentary lifestyle     Objective:    Today's Vitals   06/29/21 1103  BP: (!) 144/78  Pulse: (!) 101  SpO2: 96%  Weight: 222 lb (100.7 kg)  Height: 5\' 4"  (1.626 m)   Body mass index is 38.11 kg/m.  Advanced Directives 06/29/2021 06/22/2020 09/03/2015 05/20/2013  Does Patient Have a Medical Advance Directive? No No No Patient does not have advance directive;Patient would not like information  Would patient like information on creating a medical advance directive? No - Patient declined No - Patient declined No - patient declined information -  Pre-existing out of facility DNR order (yellow form or pink MOST form) - - - No    Current Medications (verified) Outpatient Encounter Medications as of 06/29/2021  Medication Sig   albuterol (VENTOLIN HFA) 108 (90 Base) MCG/ACT inhaler Inhale 2 puffs into the lungs every 6 (six) hours as needed for wheezing or shortness of breath.   aspirin 81 MG EC tablet Take 81 mg by mouth daily.   azelastine (ASTELIN) 0.1 % nasal spray Place 2 sprays into both nostrils 2 (two) times daily. Use in each nostril as directed (Patient taking differently: Place 2 sprays into both nostrils as needed. Use in each nostril as directed)   benzonatate (TESSALON) 100 MG capsule Take 1 capsule (100 mg total) by mouth every 8 (eight) hours.   Calcium Carbonate-Vitamin D 600-200 MG-UNIT TABS Take 1 tablet by mouth daily.   carvedilol (COREG) 6.25 MG tablet Take 1.5 tablets (9.375 mg total) by mouth 2 (two) times daily with a meal.   clobetasol ointment (TEMOVATE) 0.05 % Apply bid to affected area for 2 weeks then 3 x a week   cloNIDine (CATAPRES) 0.2 MG tablet TAKE 1 TABLET BY MOUTH EVERY DAY   FLUoxetine (PROZAC) 40 MG  capsule TAKE 1 CAPSULE(40 MG) BY MOUTH DAILY   lidocaine (XYLOCAINE) 2 % solution Use as directed 15 mLs in the mouth or throat every 6 (six) hours as needed for mouth pain.   montelukast (SINGULAIR) 10 MG tablet TAKE 1 TABLET(10 MG) BY MOUTH AT BEDTIME   pantoprazole (PROTONIX) 40 MG tablet TAKE 1 TABLET(40 MG) BY MOUTH TWICE DAILY BEFORE A MEAL   spironolactone (ALDACTONE) 25 MG tablet TAKE 1 TABLET(25 MG) BY MOUTH DAILY   Tiotropium Bromide Monohydrate (SPIRIVA RESPIMAT) 1.25 MCG/ACT AERS Inhale 2 puffs into the lungs daily. (Patient taking differently: Inhale 2 puffs into the lungs as needed.)   traZODone (DESYREL) 50 MG tablet Take 0.5-1 tablets (25-50 mg total) by mouth at bedtime as needed for sleep.   venlafaxine XR (EFFEXOR-XR) 75 MG 24 hr capsule TAKE 1 CAPSULE(75 MG) BY MOUTH DAILY WITH BREAKFAST   Vitamin D, Ergocalciferol, (DRISDOL) 1.25 MG (50000 UNIT) CAPS capsule TAKE 1 CAPSULE BY MOUTH ONCE A WEEK   amLODipine (NORVASC) 5 MG tablet Take 1 tablet (5 mg total) by mouth daily.   fluticasone (FLONASE) 50 MCG/ACT nasal spray Place 1 spray into both nostrils daily for 14 days.   No facility-administered encounter medications on file as of 06/29/2021.    Allergies (verified) Olmesartan and Lactose intolerance (gi)   History: Past Medical History:  Diagnosis Date   Allergic sinusitis 12/18/2012   Allergic urticaria 04/02/2017   Anxiety    COVID-19 virus infection  09/05/2019   Depression    Diverticulosis 12/11/2013   Colonoscopy in 2014 per Dr Oneida Alar, also has small hemmorhoids    Dizziness    Mild Orthostatic   Essential hypertension    Fatigue    History of chest pain    Negative stress echo 2010   Hyperlipidemia    Insomnia    Lab test positive for detection of COVID-19 virus 09/24/2019   Mitral valve prolapse    Non-allergic rhinitis 10/31/2017   Obesity    Paresthesias    Upper extremity /episode sensation of coldness and numbness    Piles (hemorrhoids) 07/20/2015    Raynauds phenomenon    Possible   Type 2 diabetes mellitus ([place])    Past Surgical History:  Procedure Laterality Date   ABDOMINAL HYSTERECTOMY     BREAST EXCISIONAL BIOPSY Left    BREAST SURGERY     CATARACT EXTRACTION Right 02/03/2020   CATARACT EXTRACTION Left 02/09/2020   COLONOSCOPY  06/11/2003   Smith:multiple medium scatered diverticula in the cecum, a sending colon, transverse colon, descending colon, sigmoid colon.   COLONOSCOPY N/A 05/20/2013   TICS, SML IH   Cystectomy L breast, benign     VESICOVAGINAL FISTULA CLOSURE W/ TAH  1989   Family History  Problem Relation Age of Onset   Hypertension Mother    Urticaria Mother    Heart attack Mother    Heart failure Father    Lung cancer Father    Heart disease Father    Asthma Sister    Heart disease Sister    Hypertension Brother    Crohn's disease Brother    Diabetes Brother    Hypertension Sister    Hypertension Sister    Colon cancer Maternal Grandfather        Age greater than 30   Breast cancer Daughter    Allergic rhinitis Neg Hx    Angioedema Neg Hx    Atopy Neg Hx    Eczema Neg Hx    Immunodeficiency Neg Hx    Social History   Socioeconomic History   Marital status: Married    Spouse name: Alvino   Number of children: 2   Years of education: Not on file   Highest education level: Not on file  Occupational History   Occupation: Pharmacist, hospital   Tobacco Use   Smoking status: Former    Packs/day: 0.50    Years: 16.00    Pack years: 8.00    Types: Cigarettes    Start date: 48    Quit date: 09/04/1986    Years since quitting: 34.8   Smokeless tobacco: Never  Vaping Use   Vaping Use: Never used  Substance and Sexual Activity   Alcohol use: Yes    Alcohol/week: 0.0 standard drinks    Comment: occ   Drug use: No   Sexual activity: Not Currently    Birth control/protection: Surgical    Comment: hyst  Other Topics Concern   Not on file  Social History Narrative   1 son, 1 daughter   19  grandchildren.   Social Determinants of Health   Financial Resource Strain: Low Risk    Difficulty of Paying Living Expenses: Not hard at all  Food Insecurity: No Food Insecurity   Worried About Charity fundraiser in the Last Year: Never true   [place] in the Last Year: Never true  Transportation Needs: No Transportation Needs   Lack of Transportation (Medical): No   Lack  of Transportation (Non-Medical): No  Physical Activity: Sufficiently Active   Days of Exercise per Week: 5 days   Minutes of Exercise per Session: 30 min  Stress: No Stress Concern Present   Feeling of Stress : Only a little  Social Connections: Engineer, building services of Communication with Friends and Family: More than three times a week   Frequency of Social Gatherings with Friends and Family: More than three times a week   Attends Religious Services: More than 4 times per year   Active Member of [employer] or Organizations: Yes   Attends Music therapist: More than 4 times per year   Marital Status: Married    Tobacco Counseling Counseling given: Not Answered   Clinical Intake:  Pre-visit preparation completed: Yes  Pain : No/denies pain     BMI - recorded: 38.11 Nutritional Status: BMI > 30  Obese Nutritional Risks: None Diabetes: No  How often do you need to have someone help you when you read instructions, pamphlets, or other written materials from your doctor or pharmacy?: 1 - Never  Diabetic?no  Interpreter Needed?: No  Information entered by :: MJ Kammy Klett, LPN   Activities of Daily Living In your present state of health, do you have any difficulty performing the following activities: 06/29/2021  Hearing? N  Vision? N  Difficulty concentrating or making decisions? Y  Walking or climbing stairs? N  Dressing or bathing? N  Doing errands, shopping? N  Preparing Food and eating ? N  Using the Toilet? N  In the past six months, have you accidently leaked urine?  Y  Comment Occasional  Do you have problems with loss of bowel control? N  Managing your Medications? N  Managing your Finances? N  Housekeeping or managing your Housekeeping? N  Some recent data might be hidden    Patient Care Team: Fayrene Helper, MD as PCP - General Domenic Polite Aloha Gell, MD as PCP - Cardiology (Cardiology) Danie Binder, MD (Inactive) as Consulting Physician (Gastroenterology)  Indicate any recent Medical Services you may have received from other than [facility] providers in the past year (date may be approximate).     Assessment:   This is a routine wellness examination for Alexis Hurst.  Hearing/Vision screen Hearing Screening - Comments:: Some hearing issues.  Vision Screening - Comments:: Readers. 01/18/2021. [facility]   Dietary issues and exercise activities discussed: Current Exercise Habits: Home exercise routine, Type of exercise: walking, Time (Minutes): 30, Frequency (Times/Week): 5, Weekly Exercise (Minutes/Week): 150, Intensity: Mild, Exercise limited by: cardiac condition(s)   Goals Addressed             This Visit's Progress    DIET - REDUCE CALORIE INTAKE       Pt would like to lose weight.        Depression Screen PHQ 2/9 Scores 06/29/2021 12/13/2020 09/23/2020 08/17/2020 06/22/2020 06/22/2020 06/15/2020  PHQ - 2 Score 1 0 1 0 1 0 1  PHQ- 9 Score - - 5 - - - -  Exception Documentation - - - - - Patient refusal -    Fall Risk Fall Risk  06/29/2021 12/13/2020 09/23/2020 08/17/2020 06/22/2020  Falls in the past year? 0 0 0 0 0  Number falls in past yr: 0 0 - - 0  Injury with Fall? 0 0 - - 0  Risk for fall due to : Impaired vision - - - No Fall Risks  Follow up Falls prevention discussed - - -  Falls evaluation completed    FALL RISK PREVENTION PERTAINING TO THE HOME:  Any stairs in or around the home? Yes  If so, are there any without handrails? No  Home free of loose throw rugs in walkways, pet beds, electrical cords, etc? Yes   Adequate lighting in your home to reduce risk of falls? Yes   ASSISTIVE DEVICES UTILIZED TO PREVENT FALLS:  Life alert? No  Use of a cane, walker or w/c? No  Grab bars in the bathroom? Yes  Shower chair or bench in shower? Yes  Elevated toilet seat or a handicapped toilet? Yes   TIMED UP AND GO:  Was the test performed? Yes .  Length of time to ambulate 10 feet: 8 sec.   Gait steady and fast without use of assistive device  Cognitive Function:     6CIT Screen 06/29/2021 06/22/2020 06/19/2019  What Year? 0 points 0 points 0 points  What month? 0 points 0 points 0 points  What time? 0 points 0 points 0 points  Count back from 20 0 points 0 points 0 points  Months in reverse 0 points 0 points 0 points  Repeat phrase 0 points 0 points 0 points  Total Score 0 0 0    Immunizations Immunization History  Administered Date(s) Administered   Fluad Quad(high Dose 65+) 04/28/2019, 06/15/2020, 05/13/2021   Influenza Split 05/23/2011, 05/16/2012   Influenza Whole 06/21/2008, 06/08/2009, 05/13/2010   Influenza, High Dose Seasonal PF 06/17/2018   Influenza,inj,Quad PF,6+ Mos 06/04/2013, 07/21/2014, 07/20/2015, 05/23/2016, 08/06/2017   PFIZER(Purple Top)SARS-COV-2 Vaccination 12/04/2019, 12/26/2019, 08/13/2020   Pneumococcal Conjugate-13 04/20/2014   Pneumococcal Polysaccharide-23 06/17/2018   Td 03/26/2007   Tdap 08/06/2017   Zoster, Live 08/05/2013    TDAP status: Up to date  Flu Vaccine status: Up to date  Pneumococcal vaccine status: Up to date  Covid-19 vaccine status: Information provided on how to obtain vaccines.   Qualifies for Shingles Vaccine? Yes   Zostavax completed Yes   Shingrix Completed?: No.    Education has been provided regarding the importance of this vaccine. Patient has been advised to call insurance company to determine out of pocket expense if they have not yet received this vaccine. Advised may also receive vaccine at local pharmacy or Health Dept.  Verbalized acceptance and understanding.  Screening Tests Health Maintenance  Topic Date Due   Zoster Vaccines- Shingrix (1 of 2) Never done   COVID-19 Vaccine (4 - Booster for Pfizer series) 10/08/2020   MAMMOGRAM  07/02/2022   COLONOSCOPY (Pts 45-41yrs Insurance coverage will need to be confirmed)  05/21/2023   TETANUS/TDAP  08/07/2027   Pneumonia Vaccine 68+ Years old  Completed   INFLUENZA VACCINE  Completed   DEXA SCAN  Completed   Hepatitis C Screening  Completed   HPV VACCINES  Aged Out    Health Maintenance  Health Maintenance Due  Topic Date Due   Zoster Vaccines- Shingrix (1 of 2) Never done   COVID-19 Vaccine (4 - Booster for Pfizer series) 10/08/2020    Colorectal cancer screening: Type of screening: Colonoscopy. Completed 05/20/2013. Repeat every 10 years  Mammogram status: Completed 07/22/2020. Repeat every year  Bone Density status: Completed 06/30/2019. Results reflect: Bone density results: NORMAL. Repeat every 2 years.  Lung Cancer Screening: (Low Dose CT Chest recommended if Age 35-80 years, 30 pack-year currently smoking OR have quit w/in 15years.) does not qualify.    Additional Screening:  Hepatitis C Screening: does qualify; Completed 11/15/2015  Vision Screening: Recommended  annual ophthalmology exams for early detection of glaucoma and other disorders of the eye. Is the patient up to date with their annual eye exam?  Yes  Who is the provider or what is the name of the office in which the patient attends annual eye exams? [facility] If pt is not established with a provider, would they like to be referred to a provider to establish care? No .   Dental Screening: Recommended annual dental exams for proper oral hygiene  Community Resource Referral / Chronic Care Management: CRR required this visit?  No   CCM required this visit?  No      Plan:     I have personally reviewed and noted the following in the patient's chart:   Medical and  social history Use of alcohol, tobacco or illicit drugs  Current medications and supplements including opioid prescriptions.  Functional ability and status Nutritional status Physical activity Advanced directives List of other physicians Hospitalizations, surgeries, and ER visits in previous 12 months Vitals Screenings to include cognitive, depression, and falls Referrals and appointments  In addition, I have reviewed and discussed with patient certain preventive protocols, quality metrics, and best practice recommendations. A written personalized care plan for preventive services as well as general preventive health recommendations were provided to patient.     Chriss Driver, LPN   16/06/9603   Nurse Notes: Pt states she is doing well. Enjoying grandchildren. Would like to see if bone density can be scheduled with mammogram. Mammogram is scheduled for 07/25/2021. Plans to get Covid booster and Shingles vaccines soon.

## 2021-06-29 NOTE — Patient Instructions (Signed)
[name] , Thank you for taking time to come for your Medicare Wellness Visit. I appreciate your ongoing commitment to your health goals. Please review the following plan we discussed and let me know if I can assist you in the future.   Screening recommendations/referrals: Colonoscopy: Done 05/20/2013 Repeat in 10 years  Mammogram: Done 07/22/2020, scheduled for 07/25/2021 Bone Density: Done 06/30/2019 Repeat every 2 years  Recommended yearly ophthalmology/optometry visit for glaucoma screening and checkup Recommended yearly dental visit for hygiene and checkup  Vaccinations: Influenza vaccine: Done 05/13/2021. Pneumococcal vaccine: Done 04/20/2014 and 06/17/2018 Tdap vaccine: Done 08/06/2017 Shingles vaccine: Shingrix discussed. Please contact your pharmacy for coverage information.     Covid-19:Done 12/04/19, 12/26/19 and 08/13/2020.  Advanced directives: Advance directive discussed with you today. I have provided a copy for you to complete at home and have notarized. Once this is complete please bring a copy in to our office so we can scan it into your chart.   Conditions/risks identified: Aim for 30 minutes of exercise or brisk walking each day, drink 6-8 glasses of water and eat lots of fruits and vegetables.   Next appointment: Follow up in one year for your annual wellness visit 2023.   Preventive Care 58 Years and Older, Female Preventive care refers to lifestyle choices and visits with your health care provider that can promote health and wellness. What does preventive care include? A yearly physical exam. This is also called an annual well check. Dental exams once or twice a year. Routine eye exams. Ask your health care provider how often you should have your eyes checked. Personal lifestyle choices, including: Daily care of your teeth and gums. Regular physical activity. Eating a healthy diet. Avoiding tobacco and drug use. Limiting alcohol use. Practicing safe  sex. Taking low-dose aspirin every day. Taking vitamin and mineral supplements as recommended by your health care provider. What happens during an annual well check? The services and screenings done by your health care provider during your annual well check will depend on your age, overall health, lifestyle risk factors, and family history of disease. Counseling  Your health care provider may ask you questions about your: Alcohol use. Tobacco use. Drug use. Emotional well-being. Home and relationship well-being. Sexual activity. Eating habits. History of falls. Memory and ability to understand (cognition). Work and work Statistician. Reproductive health. Screening  You may have the following tests or measurements: Height, weight, and BMI. Blood pressure. Lipid and cholesterol levels. These may be checked every 5 years, or more frequently if you are over 9 years old. Skin check. Lung cancer screening. You may have this screening every year starting at age 36 if you have a 30-pack-year history of smoking and currently smoke or have quit within the past 15 years. Fecal occult blood test (FOBT) of the stool. You may have this test every year starting at age 65. Flexible sigmoidoscopy or colonoscopy. You may have a sigmoidoscopy every 5 years or a colonoscopy every 10 years starting at age 30. Hepatitis C blood test. Hepatitis B blood test. Sexually transmitted disease (STD) testing. Diabetes screening. This is done by checking your blood sugar (glucose) after you have not eaten for a while (fasting). You may have this done every 1-3 years. Bone density scan. This is done to screen for osteoporosis. You may have this done starting at age 12. Mammogram. This may be done every 1-2 years. Talk to your health care provider about how often you should have regular mammograms. Talk with  your health care provider about your test results, treatment options, and if necessary, the need for more  tests. Vaccines  Your health care provider may recommend certain vaccines, such as: Influenza vaccine. This is recommended every year. Tetanus, diphtheria, and acellular pertussis (Tdap, Td) vaccine. You may need a Td booster every 10 years. Zoster vaccine. You may need this after age 92. Pneumococcal 13-valent conjugate (PCV13) vaccine. One dose is recommended after age 89. Pneumococcal polysaccharide (PPSV23) vaccine. One dose is recommended after age 8. Talk to your health care provider about which screenings and vaccines you need and how often you need them. This information is not intended to replace advice given to you by your health care provider. Make sure you discuss any questions you have with your health care provider. Document Released: 09/17/2015 Document Revised: 05/10/2016 Document Reviewed: 06/22/2015 Elsevier Interactive Patient Education  2017 [employer] Prevention in the Home Falls can cause injuries. They can happen to people of all ages. There are many things you can do to make your home safe and to help prevent falls. What can I do on the outside of my home? Regularly fix the edges of walkways and driveways and fix any cracks. Remove anything that might make you trip as you walk through a door, such as a raised step or threshold. Trim any bushes or trees on the path to your home. Use bright outdoor lighting. Clear any walking paths of anything that might make someone trip, such as rocks or tools. Regularly check to see if handrails are loose or broken. Make sure that both sides of any steps have handrails. Any raised decks and porches should have guardrails on the edges. Have any leaves, snow, or ice cleared regularly. Use sand or salt on walking paths during winter. Clean up any spills in your garage right away. This includes oil or grease spills. What can I do in the bathroom? Use night lights. Install grab bars by the toilet and in the tub and shower.  Do not use towel bars as grab bars. Use non-skid mats or decals in the tub or shower. If you need to sit down in the shower, use a plastic, non-slip stool. Keep the floor dry. Clean up any water that spills on the floor as soon as it happens. Remove soap buildup in the tub or shower regularly. Attach bath mats securely with double-sided non-slip rug tape. Do not have throw rugs and other things on the floor that can make you trip. What can I do in the bedroom? Use night lights. Make sure that you have a light by your bed that is easy to reach. Do not use any sheets or blankets that are too big for your bed. They should not hang down onto the floor. Have a firm chair that has side arms. You can use this for support while you get dressed. Do not have throw rugs and other things on the floor that can make you trip. What can I do in the kitchen? Clean up any spills right away. Avoid walking on wet floors. Keep items that you use a lot in easy-to-reach places. If you need to reach something above you, use a strong step stool that has a grab bar. Keep electrical cords out of the way. Do not use floor polish or wax that makes floors slippery. If you must use wax, use non-skid floor wax. Do not have throw rugs and other things on the floor that can make you trip.  What can I do with my stairs? Do not leave any items on the stairs. Make sure that there are handrails on both sides of the stairs and use them. Fix handrails that are broken or loose. Make sure that handrails are as long as the stairways. Check any carpeting to make sure that it is firmly attached to the stairs. Fix any carpet that is loose or worn. Avoid having throw rugs at the top or bottom of the stairs. If you do have throw rugs, attach them to the floor with carpet tape. Make sure that you have a light switch at the top of the stairs and the bottom of the stairs. If you do not have them, ask someone to add them for you. What else  can I do to help prevent falls? Wear shoes that: Do not have high heels. Have rubber bottoms. Are comfortable and fit you well. Are closed at the toe. Do not wear sandals. If you use a stepladder: Make sure that it is fully opened. Do not climb a closed stepladder. Make sure that both sides of the stepladder are locked into place. Ask someone to hold it for you, if possible. Clearly mark and make sure that you can see: Any grab bars or handrails. First and last steps. Where the edge of each step is. Use tools that help you move around (mobility aids) if they are needed. These include: Canes. Walkers. Scooters. Crutches. Turn on the lights when you go into a dark area. Replace any light bulbs as soon as they burn out. Set up your furniture so you have a clear path. Avoid moving your furniture around. If any of your floors are uneven, fix them. If there are any pets around you, be aware of where they are. Review your medicines with your doctor. Some medicines can make you feel dizzy. This can increase your chance of falling. Ask your doctor what other things that you can do to help prevent falls. This information is not intended to replace advice given to you by your health care provider. Make sure you discuss any questions you have with your health care provider. Document Released: 06/17/2009 Document Revised: 01/27/2016 Document Reviewed: 09/25/2014 Elsevier Interactive Patient Education  2017 [employer].

## 2021-07-07 ENCOUNTER — Other Ambulatory Visit: Payer: Self-pay | Admitting: Family Medicine

## 2021-07-07 ENCOUNTER — Other Ambulatory Visit: Payer: Self-pay

## 2021-07-07 MED ORDER — SPIRONOLACTONE 25 MG PO TABS: ORAL_TABLET | ORAL | 3 refills | Status: DC

## 2021-07-20 DIAGNOSIS — H04123 Dry eye syndrome of bilateral lacrimal glands: Secondary | ICD-10-CM | POA: Diagnosis not present

## 2021-07-20 DIAGNOSIS — R7309 Other abnormal glucose: Secondary | ICD-10-CM | POA: Diagnosis not present

## 2021-07-20 DIAGNOSIS — H3562 Retinal hemorrhage, left eye: Secondary | ICD-10-CM | POA: Diagnosis not present

## 2021-07-20 DIAGNOSIS — H35033 Hypertensive retinopathy, bilateral: Secondary | ICD-10-CM | POA: Diagnosis not present

## 2021-07-22 ENCOUNTER — Other Ambulatory Visit: Payer: Self-pay | Admitting: Family Medicine

## 2021-07-25 ENCOUNTER — Inpatient Hospital Stay (HOSPITAL_COMMUNITY): Admission: RE | Admit: 2021-07-25 | Payer: Medicare PPO | Source: Ambulatory Visit

## 2021-07-25 ENCOUNTER — Other Ambulatory Visit: Payer: Self-pay | Admitting: Family Medicine

## 2021-08-15 ENCOUNTER — Ambulatory Visit (HOSPITAL_COMMUNITY)
Admission: RE | Admit: 2021-08-15 | Discharge: 2021-08-15 | Disposition: A | Discharge status: Home / Self Care | Payer: Medicare PPO | Source: Ambulatory Visit | Attending: Family Medicine | Admitting: Family Medicine

## 2021-08-15 ENCOUNTER — Other Ambulatory Visit: Payer: Self-pay

## 2021-08-15 DIAGNOSIS — Z1231 Encounter for screening mammogram for malignant neoplasm of breast: Secondary | ICD-10-CM | POA: Diagnosis not present

## 2021-08-15 DIAGNOSIS — Z13820 Encounter for screening for osteoporosis: Secondary | ICD-10-CM | POA: Diagnosis not present

## 2021-08-15 DIAGNOSIS — Z780 Asymptomatic menopausal state: Secondary | ICD-10-CM | POA: Diagnosis not present

## 2021-08-24 ENCOUNTER — Other Ambulatory Visit: Payer: Self-pay

## 2021-08-24 DIAGNOSIS — E785 Hyperlipidemia, unspecified: Secondary | ICD-10-CM

## 2021-08-24 DIAGNOSIS — I10 Essential (primary) hypertension: Secondary | ICD-10-CM

## 2021-08-24 DIAGNOSIS — R7303 Prediabetes: Secondary | ICD-10-CM

## 2021-08-25 ENCOUNTER — Other Ambulatory Visit: Payer: Self-pay

## 2021-08-25 ENCOUNTER — Encounter: Payer: Self-pay | Admitting: Family Medicine

## 2021-08-25 ENCOUNTER — Ambulatory Visit (INDEPENDENT_AMBULATORY_CARE_PROVIDER_SITE_OTHER): Payer: Medicare PPO | Admitting: Family Medicine

## 2021-08-25 VITALS — BP 156/86 | HR 112 | Resp 17 | Ht 64.0 in | Wt 222.1 lb

## 2021-08-25 DIAGNOSIS — Z1211 Encounter for screening for malignant neoplasm of colon: Secondary | ICD-10-CM

## 2021-08-25 DIAGNOSIS — J309 Allergic rhinitis, unspecified: Secondary | ICD-10-CM | POA: Diagnosis not present

## 2021-08-25 DIAGNOSIS — J40 Bronchitis, not specified as acute or chronic: Secondary | ICD-10-CM

## 2021-08-25 DIAGNOSIS — Z0000 Encounter for general adult medical examination without abnormal findings: Secondary | ICD-10-CM

## 2021-08-25 DIAGNOSIS — R7303 Prediabetes: Secondary | ICD-10-CM

## 2021-08-25 DIAGNOSIS — I10 Essential (primary) hypertension: Secondary | ICD-10-CM | POA: Diagnosis not present

## 2021-08-25 DIAGNOSIS — B351 Tinea unguium: Secondary | ICD-10-CM

## 2021-08-25 LAB — CMP14+EGFR
ALT: 36 IU/L — ABNORMAL HIGH (ref 0–32)
AST: 29 IU/L (ref 0–40)
Albumin/Globulin Ratio: 1.3 (ref 1.2–2.2)
Albumin: 4 g/dL (ref 3.8–4.8)
Alkaline Phosphatase: 110 IU/L (ref 44–121)
BUN/Creatinine Ratio: 10 — ABNORMAL LOW (ref 12–28)
BUN: 9 mg/dL (ref 8–27)
Bilirubin Total: <0.2 mg/dL (ref 0.0–1.2)
CO2: 25 mmol/L (ref 20–29)
Calcium: 8.8 mg/dL (ref 8.7–10.3)
Chloride: 102 mmol/L (ref 96–106)
Creatinine, Ser: 0.91 mg/dL (ref 0.57–1.00)
Globulin, Total: 3.2 g/dL (ref 1.5–4.5)
Glucose: 112 mg/dL — ABNORMAL HIGH (ref 70–99)
Potassium: 4.3 mmol/L (ref 3.5–5.2)
Sodium: 140 mmol/L (ref 134–144)
Total Protein: 7.2 g/dL (ref 6.0–8.5)
eGFR: 69 mL/min/{1.73_m2} (ref >59)

## 2021-08-25 LAB — LIPID PANEL
Chol/HDL Ratio: 3.3 ratio (ref 0.0–4.4)
Cholesterol, Total: 220 mg/dL — ABNORMAL HIGH (ref 100–199)
HDL: 67 mg/dL (ref >39)
LDL Chol Calc (NIH): 136 mg/dL — ABNORMAL HIGH (ref 0–99)
Triglycerides: 99 mg/dL (ref 0–149)
VLDL Cholesterol Cal: 17 mg/dL (ref 5–40)

## 2021-08-25 LAB — HEMOGLOBIN A1C
Est. average glucose Bld gHb Est-mCnc: 134 mg/dL
Hgb A1c MFr Bld: 6.3 % — ABNORMAL HIGH (ref 4.8–5.6)

## 2021-08-25 MED ORDER — PREDNISONE 5 MG PO TABS: 5.0000 mg | ORAL_TABLET | Freq: Two times a day (BID) | ORAL | 0 refills | Status: AC

## 2021-08-25 MED ORDER — AZELASTINE HCL 0.1 % NA SOLN: 2.0000 | Freq: Two times a day (BID) | NASAL | 12 refills | Status: DC

## 2021-08-25 MED ORDER — SEMAGLUTIDE-WEIGHT MANAGEMENT 0.25 MG/0.5ML ~~LOC~~ SOAJ: 0.2500 mg | SUBCUTANEOUS | 0 refills | Status: DC

## 2021-08-25 MED ORDER — CLONIDINE HCL 0.2 MG PO TABS: 0.2000 mg | ORAL_TABLET | Freq: Two times a day (BID) | ORAL | 3 refills | Status: DC

## 2021-08-25 MED ORDER — SEMAGLUTIDE-WEIGHT MANAGEMENT 0.5 MG/0.5ML ~~LOC~~ SOAJ: 0.5000 mg | SUBCUTANEOUS | 2 refills | Status: DC

## 2021-08-25 MED ORDER — BENZONATATE 100 MG PO CAPS: 100.0000 mg | ORAL_CAPSULE | Freq: Two times a day (BID) | ORAL | 0 refills | Status: DC | PRN

## 2021-08-25 MED ORDER — AZITHROMYCIN 250 MG PO TABS: ORAL_TABLET | ORAL | 0 refills | Status: AC

## 2021-08-25 NOTE — Progress Notes (Signed)
Alexis Hurst     MRN: [id number]      DOB: 04/01/1951  HPI: Patient is in for annual physical exam. 1 week ago head and chest congestion x 4 days, green drainage and sputum, body aches and chills, no fever. Third incidnet in past 4 months Sad and tearful as she recalls losses in past 2 years of close family memebers Recent labs,  are reviewed. Immunization is reviewed , and  updated if needed.   PE: BP (!) 156/86    Pulse (!) 112    Resp 17    Ht 5\' 4"  (1.626 m)    Wt 222 lb 1.9 oz (100.8 kg)    SpO2 95%    BMI 38.13 kg/m   Pleasant  female, alert and oriented x 3, in no cardio-pulmonary distress. Afebrile. HEENT No facial trauma or asymetry. Maxillary Sinuses  tender. TM clear, oropharynx no erythema or exudate Extra occullar muscles intact.. External ears normal, . Neck: supple, no adenopathy,JVD or thyromegaly.No bruits.  Chest: Adequate air entry, scattered crackles,no wheezes. Non tender to palpation   Cardiovascular system; Heart sounds normal,  S1 and  S2 ,no S3.  No murmur, or thrill. Apical beat not displaced Peripheral pulses normal.  Abdomen: Soft, non tender.     Musculoskeletal exam: Full ROM of spine, hips , shoulders and knees. No deformity ,swelling or crepitus noted. No muscle wasting or atrophy.   Neurologic: Cranial nerves 2 to 12 intact. Power, tone ,sensation and reflexes normal throughout. No disturbance in gait. No tremor.  Skin: Intact, no ulceration, erythema , scaling or rash noted. Mild hyperpigmentation and thickening of 4th toenail on left foot  Psych; Normal mood and affect. Judgement and concentration normal   Assessment & Plan:  Annual physical exam Annual exam as documented. Counseling done  re healthy lifestyle involving commitment to 150 minutes exercise per week, heart healthy diet, and attaining healthy weight.The importance of adequate sleep also discussed. Regular seat belt use and home safety, is also  discussed. Changes in health habits are decided on by the patient with goals and time frames  set for achieving them. Immunization and cancer screening needs are specifically addressed at this visit.   Essential hypertension Uncontrolled, increase clonidine dose DASH diet and commitment to daily physical activity for a minimum of 30 minutes discussed and encouraged, as a part of hypertension management. The importance of attaining a healthy weight is also discussed.  BP/Weight 08/25/2021 06/29/2021 05/13/2021 02/16/2021 12/13/2020 12/09/2020 0/03/6225  Systolic BP 333 545 625 638 937 342 876  Diastolic BP 86 78 76 66 82 90 72  Wt. (Lbs) 222.12 222 220 219 218 - 214.8  BMI 38.13 38.11 37.76 37.59 37.42 - 36.87       Allergic sinusitis Uncontrolled, add daily astelin  Bronchitis Tessalon perles, Z pack prescribed  Prediabetes Patient educated about the importance of limiting  Carbohydrate intake , the need to commit to daily physical activity for a minimum of 30 minutes , and to commit weight loss. The fact that changes in all these areas will reduce or eliminate all together the development of diabetes is stressed.   Diabetic Labs Latest Ref Rng & Units 08/24/2021 05/13/2021 12/10/2020 06/15/2020 03/15/2020  HbA1c 4.8 - 5.6 % 6.3(H) 6.4(H) 6.4(H) 6.3(H) -  Chol 100 - 199 mg/dL 220(H) 227(H) 225(H) - -  HDL >39 mg/dL 67 71 81 - -  Calc LDL 0 - 99 mg/dL 136(H) 138(H) 130(H) - -  Triglycerides 0 -  149 mg/dL 99 104 78 - -  Creatinine 0.57 - 1.00 mg/dL 0.91 0.86 0.99 1.03(H) 1.00(H)   BP/Weight 08/25/2021 06/29/2021 05/13/2021 02/16/2021 12/13/2020 12/09/2020 11/02/3141  Systolic BP 888 757 972 820 601 561 537  Diastolic BP 86 78 76 66 82 90 72  Wt. (Lbs) 222.12 222 220 219 218 - 214.8  BMI 38.13 38.11 37.76 37.59 37.42 - 36.87   Foot/eye exam completion dates Latest Ref Rng & Units 01/18/2021 01/19/2020  Eye Exam No Retinopathy No Retinopathy Retinopathy(A)  Foot Form Completion - - -     Start weekly semaglutide  Onychomycosis Mild fungal infection

## 2021-08-25 NOTE — Patient Instructions (Addendum)
F/u in 2 months, re evaluate blood pressure and weight  Cologuard due 11/01/2020, please order    Prednisone, tessalon perle, azithromycin and Astelin are prescribed for uncontrolled allergies, sinusitis and bronchitis  Blood pressure is high, dose of clonidine is increased to twice daily, 9am and 9 pm  New is once weekly semaglutide to assist with weight loss and also blood sugar  Thickened toenails are from fungal infection, topical vicks vapor rub is beneficial   Thanks for choosing [facility], we consider it a privelige to serve you.   Best for 2023!

## 2021-08-28 ENCOUNTER — Encounter: Payer: Self-pay | Admitting: Family Medicine

## 2021-08-28 DIAGNOSIS — J40 Bronchitis, not specified as acute or chronic: Secondary | ICD-10-CM | POA: Insufficient documentation

## 2021-08-28 DIAGNOSIS — B351 Tinea unguium: Secondary | ICD-10-CM | POA: Insufficient documentation

## 2021-08-28 NOTE — Assessment & Plan Note (Signed)
Tessalon perles, Z pack prescribed

## 2021-08-28 NOTE — Assessment & Plan Note (Signed)

## 2021-08-28 NOTE — Assessment & Plan Note (Signed)
Uncontrolled, increase clonidine dose DASH diet and commitment to daily physical activity for a minimum of 30 minutes discussed and encouraged, as a part of hypertension management. The importance of attaining a healthy weight is also discussed.  BP/Weight 08/25/2021 06/29/2021 05/13/2021 02/16/2021 12/13/2020 12/09/2020 5/80/9983  Systolic BP 382 505 397 673 [phone]  Diastolic BP 86 78 76 66 82 90 72  Wt. (Lbs) 222.12 222 220 219 218 - 214.8  BMI 38.13 38.11 37.76 37.59 37.42 - 36.87

## 2021-08-28 NOTE — Assessment & Plan Note (Signed)
Uncontrolled, add daily astelin

## 2021-08-28 NOTE — Assessment & Plan Note (Addendum)
Patient educated about the importance of limiting  Carbohydrate intake , the need to commit to daily physical activity for a minimum of 30 minutes , and to commit weight loss. The fact that changes in all these areas will reduce or eliminate all together the development of diabetes is stressed.   Diabetic Labs Latest Ref Rng & Units 08/24/2021 05/13/2021 12/10/2020 06/15/2020 03/15/2020  HbA1c 4.8 - 5.6 % 6.3(H) 6.4(H) 6.4(H) 6.3(H) -  Chol 100 - 199 mg/dL 220(H) 227(H) 225(H) - -  HDL >39 mg/dL 67 71 81 - -  Calc LDL 0 - 99 mg/dL 136(H) 138(H) 130(H) - -  Triglycerides 0 - 149 mg/dL 99 104 78 - -  Creatinine 0.57 - 1.00 mg/dL 0.91 0.86 0.99 1.03(H) 1.00(H)   BP/Weight 08/25/2021 06/29/2021 05/13/2021 02/16/2021 12/13/2020 12/09/2020 12/01/1914  Systolic BP 606 004 599 774 142 395 320  Diastolic BP 86 78 76 66 82 90 72  Wt. (Lbs) 222.12 222 220 219 218 - 214.8  BMI 38.13 38.11 37.76 37.59 37.42 - 36.87   Foot/eye exam completion dates Latest Ref Rng & Units 01/18/2021 01/19/2020  Eye Exam No Retinopathy No Retinopathy Retinopathy(A)  Foot Form Completion - - -    Start weekly semaglutide

## 2021-08-28 NOTE — Assessment & Plan Note (Signed)
Mild fungal infection

## 2021-08-31 NOTE — Progress Notes (Signed)
Cardiology Office Note    Date:  09/01/2021   ID:  Alexis Hurst, Alexis Hurst 23-Aug-1951, MRN [id number]  PCP:  Fayrene Helper, MD  Cardiologist: Rozann Lesches, MD    Chief Complaint  Patient presents with   Follow-up    6 month visit    History of Present Illness:    Alexis Hurst is a 70 y.o. female with past medical history of palpitations (PAC's, PVC's and brief NSVT by prior monitor in 09/2020), HTN, HLD and Type 2 DM who presents to the office today for 82-month follow-up.   She was last examined by myself in 02/2021 and denied any recent anginal symptoms at that time. Was previously having palpitations but this had improved with titration of Coreg, therefore she was continued on her current dose of 9.375mg  BID. She did report lower extremity edema and it was felt her symptoms were possibly due to Amlodipine, therefore this was reduced from 10mg  daily to 5mg  daily.   In talking with the patient today, she reports overall doing well since her last office visit. She continues to have intermittent palpitations but says they spontaneously resolve and are typically more triggered by caffeine consumption.  She has baseline dyspnea on exertion which has been unchanged for the past few years. 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 No reported orthopnea or PND. Her lower extremity edema did improve following dose reduction of Amlodipine at her last office visit. Clonidine was recently titrated by her PCP last week due to elevated BP and she reports fatigue with this.  Past Medical History:  Diagnosis Date   Allergic sinusitis 12/18/2012   Allergic urticaria 04/02/2017   Anxiety    COVID-19 virus infection 09/05/2019   Depression    Diverticulosis 12/11/2013   Colonoscopy in 2014 per Dr Oneida Alar, also has small hemmorhoids    Dizziness    Mild  Orthostatic   Essential hypertension    Fatigue    History of chest pain    Negative stress echo 2010   Hyperlipidemia    Insomnia    Lab test positive for detection of COVID-19 virus 09/24/2019   Mitral valve prolapse    Non-allergic rhinitis 10/31/2017   Obesity    Paresthesias    Upper extremity /episode sensation of coldness and numbness    Piles (hemorrhoids) 07/20/2015   Raynauds phenomenon    Possible   Type 2 diabetes mellitus ([place])     Past Surgical History:  Procedure Laterality Date   ABDOMINAL HYSTERECTOMY     BREAST EXCISIONAL BIOPSY Left    BREAST SURGERY     CATARACT EXTRACTION Right 02/03/2020   CATARACT EXTRACTION Left 02/09/2020   COLONOSCOPY  06/11/2003   Smith:multiple medium scatered diverticula in the cecum, a sending colon, transverse colon, descending colon, sigmoid colon.   COLONOSCOPY N/A 05/20/2013   TICS, SML IH   Cystectomy L breast, benign     VESICOVAGINAL FISTULA CLOSURE W/ TAH  1989    Current Medications: Outpatient Medications Prior to Visit  Medication Sig Dispense Refill   albuterol (VENTOLIN HFA) 108 (90 Base) MCG/ACT inhaler Inhale 2 puffs into the lungs every 6 (six) hours as needed for wheezing or shortness of breath. 8 g 6   aspirin 81 MG EC tablet Take 81 mg by mouth daily.     azelastine (ASTELIN) 0.1 % nasal  spray Place 2 sprays into both nostrils 2 (two) times daily. Use in each nostril as directed 30 mL 12   azelastine (ASTELIN) 0.1 % nasal spray Place 2 sprays into both nostrils 2 (two) times daily. Use in each nostril as directed 30 mL 12   benzonatate (TESSALON) 100 MG capsule Take 1 capsule (100 mg total) by mouth 2 (two) times daily as needed for cough. 20 capsule 0   Calcium Carbonate-Vitamin D 600-200 MG-UNIT TABS Take 1 tablet by mouth daily.     carvedilol (COREG) 6.25 MG tablet Take 1.5 tablets (9.375 mg total) by mouth 2 (two) times daily with a meal. 360 tablet 2   clobetasol ointment (TEMOVATE) 0.05 % Apply bid to  affected area for 2 weeks then 3 x a week 45 g 3   cloNIDine (CATAPRES) 0.2 MG tablet Take 1 tablet (0.2 mg total) by mouth 2 (two) times daily. 60 tablet 3   FLUoxetine (PROZAC) 40 MG capsule TAKE 1 CAPSULE(40 MG) BY MOUTH DAILY 30 capsule 5   fluticasone (FLONASE) 50 MCG/ACT nasal spray Place 1 spray into both nostrils daily for 14 days. 16 g 0   montelukast (SINGULAIR) 10 MG tablet TAKE 1 TABLET(10 MG) BY MOUTH AT BEDTIME 90 tablet 0   pantoprazole (PROTONIX) 40 MG tablet TAKE 1 TABLET(40 MG) BY MOUTH TWICE DAILY BEFORE A MEAL 180 tablet 1   spironolactone (ALDACTONE) 25 MG tablet TAKE 1 TABLET(25 MG) BY MOUTH DAILY 90 tablet 3   Tiotropium Bromide Monohydrate (SPIRIVA RESPIMAT) 1.25 MCG/ACT AERS Inhale 2 puffs into the lungs daily. (Patient taking differently: Inhale 2 puffs into the lungs as needed.) 4 g 0   traZODone (DESYREL) 50 MG tablet Take 0.5-1 tablets (25-50 mg total) by mouth at bedtime as needed for sleep. 90 tablet 1   venlafaxine XR (EFFEXOR-XR) 75 MG 24 hr capsule TAKE 1 CAPSULE(75 MG) BY MOUTH DAILY WITH BREAKFAST 90 capsule 1   Vitamin D, Ergocalciferol, (DRISDOL) 1.25 MG (50000 UNIT) CAPS capsule TAKE 1 CAPSULE BY MOUTH ONCE A WEEK 12 capsule 1   amLODipine (NORVASC) 5 MG tablet Take 1 tablet (5 mg total) by mouth daily. 90 tablet 3   Semaglutide-Weight Management 0.25 MG/0.5ML SOAJ Inject 0.25 mg into the skin once a week. (Patient not taking: Reported on 09/01/2021) 2 mL 0   [START ON 09/29/2021] Semaglutide-Weight Management 0.5 MG/0.5ML SOAJ Inject 0.5 mg into the skin once a week. (Patient not taking: Reported on 09/01/2021) 2 mL 2   No facility-administered medications prior to visit.     Allergies:   Olmesartan and Lactose intolerance (gi)   Social History   Socioeconomic History   Marital status: Married    Spouse name: Alvino   Number of children: 2   Years of education: Not on file   Highest education level: Not on file  Occupational History   Occupation:  Pharmacist, hospital   Tobacco Use   Smoking status: Former    Packs/day: 0.50    Years: 16.00    Pack years: 8.00    Types: Cigarettes    Start date: 57    Quit date: 09/04/1986    Years since quitting: 35.0   Smokeless tobacco: Never  Vaping Use   Vaping Use: Never used  Substance and Sexual Activity   Alcohol use: Yes    Alcohol/week: 0.0 standard drinks    Comment: occ   Drug use: No   Sexual activity: Not Currently    Birth control/protection: Surgical  Comment: hyst  Other Topics Concern   Not on file  Social History Narrative   1 son, 1 daughter   1 grandchildren.   Social Determinants of Health   Financial Resource Strain: Low Risk    Difficulty of Paying Living Expenses: Not hard at all  Food Insecurity: No Food Insecurity   Worried About Charity fundraiser in the Last Year: Never true   [place] in the Last Year: Never true  Transportation Needs: No Transportation Needs   Lack of Transportation (Medical): No   Lack of Transportation (Non-Medical): No  Physical Activity: Sufficiently Active   Days of Exercise per Week: 5 days   Minutes of Exercise per Session: 30 min  Stress: No Stress Concern Present   Feeling of Stress : Only a little  Social Connections: Engineer, building services of Communication with Friends and Family: More than three times a week   Frequency of Social Gatherings with Friends and Family: More than three times a week   Attends Religious Services: More than 4 times per year   Active Member of [employer] or Organizations: Yes   Attends Music therapist: More than 4 times per year   Marital Status: Married     Family History:  The patient's family history includes Asthma in her sister; Breast cancer in her daughter; Colon cancer in her maternal grandfather; Crohn's disease in her brother; Diabetes in her brother; Heart attack in her mother; Heart disease in her father and sister; Heart failure in her father; Hypertension in her  brother, mother, sister, and sister; Lung cancer in her father; Urticaria in her mother.   Review of Systems:    Please see the history of present illness.     All other systems reviewed and are otherwise negative except as noted above.   Physical Exam:    VS:  BP 132/78    Pulse 100    Ht 5\' 4"  (1.626 m)    Wt 227 lb (103 kg)    SpO2 97%    BMI 38.96 kg/m    General: Well developed, well nourished,female appearing in no acute distress. Head: Normocephalic, atraumatic. Neck: No carotid bruits. JVD not elevated.  Lungs: Respirations regular and unlabored, without wheezes or rales.  Heart: Regular rate and rhythm. No S3 or S4.  No murmur, no rubs, or gallops appreciated. Abdomen: Appears non-distended. No obvious abdominal masses. Msk:  Strength and tone appear normal for age. No obvious joint deformities or effusions. Extremities: No clubbing or cyanosis. Trace edema along RLE, no edema along LLE. Compression stockings in place.  Distal pedal pulses are 2+ bilaterally. Neuro: Alert and oriented X 3. Moves all extremities spontaneously. No focal deficits noted. Psych:  Responds to questions appropriately with a normal affect. Skin: No rashes or lesions noted  Wt Readings from Last 3 Encounters:  09/01/21 227 lb (103 kg)  08/25/21 222 lb 1.9 oz (100.8 kg)  06/29/21 222 lb (100.7 kg)     Studies/Labs Reviewed:   EKG:  EKG is ordered today.  The ekg ordered today demonstrates sinus tachycardia, HR 100 with no acute ST changes when compared to prior tracings.   Recent Labs: 08/24/2021: ALT 36; BUN 9; Creatinine, Ser 0.91; Potassium 4.3; Sodium 140   Lipid Panel    Component Value Date/Time   CHOL 220 (H) 08/24/2021 1148   TRIG 99 08/24/2021 1148   HDL 67 08/24/2021 1148   CHOLHDL 3.3 08/24/2021 1148  CHOLHDL 2.2 01/30/2020 1121   VLDL 19 03/28/2017 1023   LDLCALC 136 (H) 08/24/2021 1148   LDLCALC 71 01/30/2020 1121    Additional studies/ records that were reviewed today  include:   Echocardiogram: 01/2019 IMPRESSIONS     1. The left ventricle has normal systolic function with an ejection  fraction of 60-65%. The cavity size was normal. There is moderately  increased left ventricular wall thickness. Left ventricular diastolic  Doppler parameters are consistent with impaired   relaxation. No evidence of left ventricular regional wall motion  abnormalities.   2. The right ventricle has normal systolic function. The cavity was  normal. There is no increase in right ventricular wall thickness. Right  ventricular systolic pressure normal with an estimated pressure of 27.6  mmHg.   3. Trivial pericardial effusion is present.   4. The aortic valve is tricuspid. Mild aortic annular calcification  noted.   5. The mitral valve is grossly normal. There is mild mitral annular  calcification present.   6. The tricuspid valve is grossly normal.   7. The aortic root is normal in size and structure.   NST: 01/2019 No diagnostic ST changes to indicate ischemia. Small, mild intensity, partially reversible apical anterior defect that is consistent with breast attenuation. This is a low risk study. Nuclear stress EF: 75%.  Event Monitor: 09/2020 [employer] monitor reviewed, 13 days 8 hours analyzed.  Predominant rhythm is sinus with heart rate ranging from 77 bpm up to 146 bpm and average heart rate 96 bpm.  Rare PACs were noted representing less than 1% total beats.  Rare PVCs were noted representing less than 1% total beats.  3 beat episode of NSVT noted.  Patient triggered event corresponded to normal sinus rhythm.  There were no sustained arrhythmias or pauses.  Assessment:    1. Palpitations   2. Essential hypertension   3. Hyperlipidemia LDL goal <100   4. Bilateral lower extremity edema      Plan:   In order of problems listed above:  1. Palpitations - She had rare PAC's and PVC's by prior monitor in 09/2020 but no sustained arrhythmias at that time  as outlined above. She continues to have palpitations but reports they spontaneously resolve and do not persist. Did recommend reduction of caffeine intake. Will continue Coreg at current dose of 9.375 mg twice daily and I encouraged her to make [place] aware if she has worsening symptoms as this could be further titrated if needed.   2. HTN - Her BP is well controlled at 132/78 during today's visit. Clonidine was recently titrated to 0.2 mg twice daily and she reports fatigue with this. I encouraged her to continue this for 1 more week and if fatigue persists, would reduce AM dose to 0.1 mg. If she continues to have fatigue, will likely need to go back to her prior dosing of once nightly and titrate Coreg. Continue Coreg at current dosing for now of 9.375 mg twice daily along with Amlodipine 5 mg daily and Spironolactone 25 mg daily. Previously intolerant to ACE-I/ARB.   3. HLD - Followed by her PCP. FLP earlier this month showed total cholesterol 220, triglycerides 99, HDL 67 and LDL 136.  She has preferred to focus on dietary changes and is hoping to resume routine exercise next year  4. Lower Extremity Edema - Symptoms significantly improved following her last visit with dose reduction of Amlodipine from 10 mg daily to 5 mg daily. She remains on Spironolactone 25  mg daily and was encouraged to continue to use compression stockings.    Medication Adjustments/Labs and Tests Ordered: Current medicines are reviewed at length with the patient today.  Concerns regarding medicines are outlined above.  Medication changes, Labs and Tests ordered today are listed in the Patient Instructions below. Patient Instructions  Medication Instructions:  Continue Clonidine at current dosing for now. If fatigue persists after 1 week, reduce AM dose to 0.1mg  and continue 0.2mg  at night. If still intolerant to the AM dose, please call our office as we may need to adjust your Coreg.   *If you need a refill on your cardiac  medications before your next appointment, please call your pharmacy*   Lab Work:  None  Testing/Procedures:  None   Follow-Up: At [facility], you and your health needs are our priority.  As part of our continuing mission to provide you with exceptional heart care, we have created designated Provider Care Teams.  These Care Teams include your primary Cardiologist (physician) and Advanced Practice Providers (APPs -  Physician Assistants and Nurse Practitioners) who all work together to provide you with the care you need, when you need it.  We recommend signing up for the patient portal called "MyChart".  Sign up information is provided on this After Visit Summary.  MyChart is used to connect with patients for Virtual Visits (Telemedicine).  Patients are able to view lab/test results, encounter notes, upcoming appointments, etc.  Non-urgent messages can be sent to your provider as well.   To learn more about what you can do with MyChart, go to [email or web address].    Your next appointment:   6 month(s)  The format for your next appointment:   In Person  Provider:   You may see Rozann Lesches, MD or one of the following Advanced Practice Providers on your designated Care Team:   Bernerd Pho, PA-C  Ermalinda Barrios, PA-C       Signed, Erma Heritage, [place]  09/01/2021 [place]. [address] [place], [place] [address] Phone: [phone] Fax: [phone]

## 2021-09-01 ENCOUNTER — Encounter: Payer: Self-pay | Admitting: Student

## 2021-09-01 ENCOUNTER — Other Ambulatory Visit: Payer: Self-pay

## 2021-09-01 ENCOUNTER — Ambulatory Visit (INDEPENDENT_AMBULATORY_CARE_PROVIDER_SITE_OTHER): Payer: Medicare PPO | Admitting: Student

## 2021-09-01 VITALS — BP 132/78 | HR 100 | Ht 64.0 in | Wt 227.0 lb

## 2021-09-01 DIAGNOSIS — R002 Palpitations: Secondary | ICD-10-CM

## 2021-09-01 DIAGNOSIS — I10 Essential (primary) hypertension: Secondary | ICD-10-CM | POA: Diagnosis not present

## 2021-09-01 DIAGNOSIS — R600 Localized edema: Secondary | ICD-10-CM

## 2021-09-01 DIAGNOSIS — E785 Hyperlipidemia, unspecified: Secondary | ICD-10-CM

## 2021-09-01 NOTE — Patient Instructions (Signed)
Medication Instructions:  Continue Clonidine at current dosing for now. If fatigue persists after 1 week, reduce AM dose to 0.1mg  and continue 0.2mg  at night. If still intolerant to the AM dose, please call our office as we may need to adjust your Coreg.   *If you need a refill on your cardiac medications before your next appointment, please call your pharmacy*   Lab Work:  None  Testing/Procedures:  None   Follow-Up: At [facility], you and your health needs are our priority.  As part of our continuing mission to provide you with exceptional heart care, we have created designated Provider Care Teams.  These Care Teams include your primary Cardiologist (physician) and Advanced Practice Providers (APPs -  Physician Assistants and Nurse Practitioners) who all work together to provide you with the care you need, when you need it.  We recommend signing up for the patient portal called "MyChart".  Sign up information is provided on this After Visit Summary.  MyChart is used to connect with patients for Virtual Visits (Telemedicine).  Patients are able to view lab/test results, encounter notes, upcoming appointments, etc.  Non-urgent messages can be sent to your provider as well.   To learn more about what you can do with MyChart, go to [email or web address].    Your next appointment:   6 month(s)  The format for your next appointment:   In Person  Provider:   You may see Rozann Lesches, MD or one of the following Advanced Practice Providers on your designated Care Team:   [place], PA-C  Ermalinda Barrios, [place]

## 2021-10-11 ENCOUNTER — Encounter: Payer: Self-pay | Admitting: Podiatry

## 2021-10-11 ENCOUNTER — Other Ambulatory Visit: Payer: Self-pay

## 2021-10-11 ENCOUNTER — Ambulatory Visit (INDEPENDENT_AMBULATORY_CARE_PROVIDER_SITE_OTHER): Payer: Medicare PPO | Admitting: Podiatry

## 2021-10-11 ENCOUNTER — Other Ambulatory Visit: Payer: Self-pay | Admitting: Family Medicine

## 2021-10-11 DIAGNOSIS — B353 Tinea pedis: Secondary | ICD-10-CM | POA: Diagnosis not present

## 2021-10-11 MED ORDER — KETOCONAZOLE 2 % EX CREA: 1.0000 "application " | TOPICAL_CREAM | Freq: Every day | CUTANEOUS | 2 refills | Status: DC

## 2021-10-12 NOTE — Progress Notes (Signed)
°  Subjective:  Patient ID: Alexis Hurst, female    DOB: June 10, 1951,  MRN: [id number]  Chief Complaint  Patient presents with   Nail Problem       NP // Bilateral great toenails are painful    71 y.o. female presents with the above complaint. History confirmed with patient.  Sometimes the corners of the nails cause pain.  She is also concerned about possibility of getting nail fungus.  Her brother had a history of osteomyelitis and ended up with amputation due to poor circulation and she is concerned this could happen to her as well.  She does have some itching in the feet  Objective:  Physical Exam: warm, good capillary refill, no trophic changes or ulcerative lesions, normal DP and PT pulses, normal sensory exam, and no evidence of onychomycosis, slightly incurvated medial lateral borders of the hallux nail, there is dry scaling skin between the toes. Assessment:   1. Tinea pedis of both feet      Plan:  Patient was evaluated and treated and all questions answered.  Discussed the etiology and treatment options for tinea pedis.  Discussed topical and oral treatment.  Recommended topical treatment with 2% ketoconazole cream.  This was sent to the patient's pharmacy.  Also discussed appropriate foot hygiene, use of antifungal spray such as Tinactin in shoes, as well as cleaning her foot surfaces such as showers and bathroom floors with bleach.  I discussed with her patients that develop osteomyelitis usually have other risk factors including peripheral vascular disease or diabetes none of which she has.  Her feet are in good shape in good condition and I am confident that any blisters cuts or lacerations would heal uneventfully she is a low risk of developing infection or amputation at this point.  Nails were trimmed in length and thickness using sharp nail nipper as a courtesy.  Discussed with her that her nails thankfully are free of onychomycosis.  We also discussed that she likely  would not qualify for coverage of routine regular nail debridements but we are happy to do this at out-of-pocket cost.  She will return as needed for this.  Return if symptoms worsen or fail to improve.

## 2021-10-15 ENCOUNTER — Other Ambulatory Visit: Payer: Self-pay | Admitting: Gastroenterology

## 2021-10-15 DIAGNOSIS — K219 Gastro-esophageal reflux disease without esophagitis: Secondary | ICD-10-CM

## 2021-10-18 ENCOUNTER — Encounter: Payer: Self-pay | Admitting: Internal Medicine

## 2021-10-18 NOTE — Telephone Encounter (Signed)
Alexis Hurst - needs office visit.

## 2021-10-22 ENCOUNTER — Other Ambulatory Visit: Payer: Self-pay | Admitting: Family Medicine

## 2021-10-26 ENCOUNTER — Ambulatory Visit: Payer: Medicare PPO | Admitting: Family Medicine

## 2021-11-02 ENCOUNTER — Ambulatory Visit (INDEPENDENT_AMBULATORY_CARE_PROVIDER_SITE_OTHER): Payer: Medicare PPO | Admitting: Family Medicine

## 2021-11-02 ENCOUNTER — Encounter: Payer: Self-pay | Admitting: Family Medicine

## 2021-11-02 ENCOUNTER — Other Ambulatory Visit: Payer: Self-pay

## 2021-11-02 VITALS — BP 126/79 | HR 105 | Resp 16 | Ht 64.0 in | Wt 221.4 lb

## 2021-11-02 DIAGNOSIS — E785 Hyperlipidemia, unspecified: Secondary | ICD-10-CM

## 2021-11-02 DIAGNOSIS — E559 Vitamin D deficiency, unspecified: Secondary | ICD-10-CM | POA: Diagnosis not present

## 2021-11-02 DIAGNOSIS — I10 Essential (primary) hypertension: Secondary | ICD-10-CM

## 2021-11-02 DIAGNOSIS — R7303 Prediabetes: Secondary | ICD-10-CM | POA: Diagnosis not present

## 2021-11-02 DIAGNOSIS — F5104 Psychophysiologic insomnia: Secondary | ICD-10-CM | POA: Diagnosis not present

## 2021-11-02 MED ORDER — AMLODIPINE BESYLATE 5 MG PO TABS: 5.0000 mg | ORAL_TABLET | Freq: Every day | ORAL | 3 refills | Status: DC

## 2021-11-02 MED ORDER — FLUOXETINE HCL 40 MG PO CAPS: ORAL_CAPSULE | ORAL | 3 refills | Status: DC

## 2021-11-02 NOTE — Progress Notes (Signed)
F/u in 2nd week in May, call if you need me sooner ? ?Stay on semaglutide 5 mg / week dose, if in April, less effective please get in touch for me to increase the dose ? ?Alexis Hurst     MRN: [id number]      DOB: 05-02-1951 ? ? ?HPI ?Alexis Hurst is here for follow up and re-evaluation of chronic medical conditions, medication management and review of any available recent lab and radiology data.  ?Preventive health is updated, specifically  Cancer screening and Immunization.   ?Questions or concerns regarding consultations or procedures which the PT has had in the interim are  addressed. ?The PT denies any adverse reactions to current medications since the last visit.  ?3 weeks ago had left lower chest pain, cramp under left ribduration approx 5 mins , could localize sore spot afterwards lasted 1 week rated at a 5 deep breaths and direct pressure made it worse, took nothing  ?ROS ?Denies recent fever or chills. ?Denies sinus pressure, nasal congestion, ear pain or sore throat. ?Denies chest congestion, productive cough or wheezing. ?Denies  palpitations and leg swelling ?Denies abdominal pain, nausea, vomiting,diarrhea or constipation.   ?Denies dysuria, frequency, hesitancy or incontinence. ?Denies joint pain, swelling and limitation in mobility. ?Denies headaches, seizures, numbness, or tingling. ?Denies depression, anxiety or insomnia. ?Denies skin break down or rash. ? ? ?PE ? ?BP 126/79   Pulse (!) 105   Resp 16   Ht 5\' 4"  (1.626 m)   Wt 221 lb 6.4 oz (100.4 kg)   SpO2 96%   BMI 38.00 kg/m?  ? ?Patient alert and oriented and in no cardiopulmonary distress. ? ?HEENT: No facial asymmetry, EOMI,     Neck supple . ? ?Chest: Clear to auscultation bilaterally.No localized reproducible pain om palpation ? ?CVS: S1, S2 no murmurs, no S3.Regular rate. ? ?ABD: Soft non tender.  ? ?Ext: No edema ? ?MS: Adequate ROM spine, shoulders, hips and knees. ? ?Skin: Intact, no ulcerations or rash noted. ? ?Psych: Good  eye contact, normal affect. Memory intact not anxious or depressed appearing. ? ?CNS: CN 2-12 intact, power,  normal throughout.no focal deficits noted. ? ? ?Assessment & Plan ? ?Essential hypertension ?DASH diet and commitment to daily physical activity for a minimum of 30 minutes discussed and encouraged, as a part of hypertension management. ?The importance of attaining a healthy weight is also discussed. ? ?BP/Weight 11/02/2021 09/01/2021 08/25/2021 06/29/2021 05/13/2021 02/16/2021 12/13/2020  ?Systolic BP 664 403 474 259 138 112 124  ?Diastolic BP 79 78 86 78 76 66 82  ?Wt. (Lbs) 221.4 227 222.12 222 220 219 218  ?BMI 38 38.96 38.13 38.11 37.76 37.59 37.42  ? ? ? ?Controlled, no change in medication ? ? ?Morbid obesity ([place]) ? ?Patient re-educated about  the importance of commitment to a  minimum of 150 minutes of exercise per week as able. ? ?The importance of healthy food choices with portion control discussed, as well as eating regularly and within a 12 hour window most days. ?The need to choose "clean , green" food 50 to 75% of the time is discussed, as well as to make water the primary drink and set a goal of 64 ounces water daily. ? ?  ?Weight /BMI 11/02/2021 09/01/2021 08/25/2021  ?WEIGHT 221 lb 6.4 oz 227 lb 222 lb 1.9 oz  ?HEIGHT 5\' 4"  5\' 4"  5\' 4"   ?BMI 38 kg/m2 38.96 kg/m2 38.13 kg/m2  ?improved , continue med ? ? ? ?Prediabetes ?  Patient educated about the importance of limiting  Carbohydrate intake , the need to commit to daily physical activity for a minimum of 30 minutes , and to commit weight loss. ?The fact that changes in all these areas will reduce or eliminate all together the development of diabetes is stressed.  ?Updated lab needed at/ before next visit. ? ? ?Diabetic Labs Latest Ref Rng & Units 08/24/2021 05/13/2021 12/10/2020 06/15/2020 03/15/2020  ?HbA1c 4.8 - 5.6 % 6.3(H) 6.4(H) 6.4(H) 6.3(H) -  ?Chol 100 - 199 mg/dL 220(H) 227(H) 225(H) - -  ?HDL >39 mg/dL 67 71 81 - -  ?Calc LDL 0 - 99 mg/dL 136(H)  138(H) 130(H) - -  ?Triglycerides 0 - 149 mg/dL 99 104 78 - -  ?Creatinine 0.57 - 1.00 mg/dL 0.91 0.86 0.99 1.03(H) 1.00(H)  ? ?BP/Weight 11/02/2021 09/01/2021 08/25/2021 06/29/2021 05/13/2021 02/16/2021 12/13/2020  ?Systolic BP 103 159 458 592 138 112 124  ?Diastolic BP 79 78 86 78 76 66 82  ?Wt. (Lbs) 221.4 227 222.12 222 220 219 218  ?BMI 38 38.96 38.13 38.11 37.76 37.59 37.42  ? ?Foot/eye exam completion dates Latest Ref Rng & Units 01/18/2021 01/19/2020  ?Eye Exam No Retinopathy No Retinopathy Retinopathy(A)  ?Foot Form Completion - - -  ? ? ? ? ?Hyperlipidemia LDL goal <100 ?Hyperlipidemia:Low fat diet discussed and encouraged. ? ? ?Lipid Panel  ?Lab Results  ?Component Value Date  ? CHOL 220 (H) 08/24/2021  ? HDL 67 08/24/2021  ? LDLCALC 136 (H) 08/24/2021  ? TRIG 99 08/24/2021  ? CHOLHDL 3.3 08/24/2021  ? ? ? ?Uncontrolled ?Updated lab needed at/ before next visit. ? ? ?Insomnia ?Sleep hygiene reviewed and written information offered also. ?Prescription sent for  medication needed. ? ? ?Vitamin D deficiency ?Updated lab needed at/ before next visit. ? ? ?

## 2021-11-02 NOTE — Patient Instructions (Signed)
Follow-up second week in May call if you need me sooner. ? ?You have lost 6 pounds since starting your new medication which is good. ? ?Stay on semaglutide 5 mg/week, if in April you find that it is less effective call for an increase in dose as this is low starting dose. ? ? ?Fasting CBC, lipid, cmp and EGFr, HBA1C, TSH and vit D 1 week before next appointment ? ?Please bring your COVID-vaccine information to your next visit. ? ?You do need the Shingrix vaccines please get these at your pharmacy at your earliest convenience. ? ?It is important that you exercise regularly at least 30 minutes 5 times a week. If you develop chest pain, have severe difficulty breathing, or feel very tired, stop exercising immediately and seek medical attention  ? ?Think about what you will eat, plan ahead. ?Choose " clean, green, fresh or frozen" over canned, processed or packaged foods which are more sugary, salty and fatty. ?70 to 75% of food eaten should be vegetables and fruit. ?Three meals at set times with snacks allowed between meals, but they must be fruit or vegetables. ?Aim to eat over a 12 hour period , example 7 am to 7 pm, and STOP after  your last meal of the day. ?Drink water,generally about 64 ounces per day, no other drink is as healthy. Fruit juice is best enjoyed in a healthy way, by EATING the fruit. ?Thanks for choosing [facility], we consider it a privelige to serve you. ? ?

## 2021-11-07 ENCOUNTER — Encounter: Payer: Self-pay | Admitting: Family Medicine

## 2021-11-07 DIAGNOSIS — E559 Vitamin D deficiency, unspecified: Secondary | ICD-10-CM | POA: Insufficient documentation

## 2021-11-07 NOTE — Assessment & Plan Note (Signed)
Sleep hygiene reviewed and written information offered also. Prescription sent for  medication needed.  

## 2021-11-07 NOTE — Assessment & Plan Note (Signed)
Updated lab needed at/ before next visit.   

## 2021-11-07 NOTE — Assessment & Plan Note (Signed)
Patient educated about the importance of limiting  Carbohydrate intake , the need to commit to daily physical activity for a minimum of 30 minutes , and to commit weight loss. ?The fact that changes in all these areas will reduce or eliminate all together the development of diabetes is stressed.  ?Updated lab needed at/ before next visit. ? ? ?Diabetic Labs Latest Ref Rng & Units 08/24/2021 05/13/2021 12/10/2020 06/15/2020 03/15/2020  ?HbA1c 4.8 - 5.6 % 6.3(H) 6.4(H) 6.4(H) 6.3(H) -  ?Chol 100 - 199 mg/dL 220(H) 227(H) 225(H) - -  ?HDL >39 mg/dL 67 71 81 - -  ?Calc LDL 0 - 99 mg/dL 136(H) 138(H) 130(H) - -  ?Triglycerides 0 - 149 mg/dL 99 104 78 - -  ?Creatinine 0.57 - 1.00 mg/dL 0.91 0.86 0.99 1.03(H) 1.00(H)  ? ?BP/Weight 11/02/2021 09/01/2021 08/25/2021 06/29/2021 05/13/2021 02/16/2021 12/13/2020  ?Systolic BP 357 017 793 903 138 112 124  ?Diastolic BP 79 78 86 78 76 66 82  ?Wt. (Lbs) 221.4 227 222.12 222 220 219 218  ?BMI 38 38.96 38.13 38.11 37.76 37.59 37.42  ? ?Foot/eye exam completion dates Latest Ref Rng & Units 01/18/2021 01/19/2020  ?Eye Exam No Retinopathy No Retinopathy Retinopathy(A)  ?Foot Form Completion - - -  ? ? ? ?

## 2021-11-07 NOTE — Assessment & Plan Note (Signed)
DASH diet and commitment to daily physical activity for a minimum of 30 minutes discussed and encouraged, as a part of hypertension management. ?The importance of attaining a healthy weight is also discussed. ? ?BP/Weight 11/02/2021 09/01/2021 08/25/2021 06/29/2021 05/13/2021 02/16/2021 12/13/2020  ?Systolic BP 175 301 040 459 138 112 124  ?Diastolic BP 79 78 86 78 76 66 82  ?Wt. (Lbs) 221.4 227 222.12 222 220 219 218  ?BMI 38 38.96 38.13 38.11 37.76 37.59 37.42  ? ? ? ?Controlled, no change in medication ? ?

## 2021-11-07 NOTE — Assessment & Plan Note (Signed)
?  Patient re-educated about  the importance of commitment to a  minimum of 150 minutes of exercise per week as able. ? ?The importance of healthy food choices with portion control discussed, as well as eating regularly and within a 12 hour window most days. ?The need to choose "clean , green" food 50 to 75% of the time is discussed, as well as to make water the primary drink and set a goal of 64 ounces water daily. ? ?  ?Weight /BMI 11/02/2021 09/01/2021 08/25/2021  ?WEIGHT 221 lb 6.4 oz 227 lb 222 lb 1.9 oz  ?HEIGHT '5\' 4"'$  '5\' 4"'$  '5\' 4"'$   ?BMI 38 kg/m2 38.96 kg/m2 38.13 kg/m2  ?improved , continue med ? ? ?

## 2021-11-07 NOTE — Assessment & Plan Note (Signed)
Hyperlipidemia:Low fat diet discussed and encouraged. ? ? ?Lipid Panel  ?Lab Results  ?Component Value Date  ? CHOL 220 (H) 08/24/2021  ? HDL 67 08/24/2021  ? LDLCALC 136 (H) 08/24/2021  ? TRIG 99 08/24/2021  ? CHOLHDL 3.3 08/24/2021  ? ? ? ?Uncontrolled ?Updated lab needed at/ before next visit. ? ?

## 2021-11-11 DIAGNOSIS — Z1211 Encounter for screening for malignant neoplasm of colon: Secondary | ICD-10-CM | POA: Diagnosis not present

## 2021-11-17 LAB — COLOGUARD: COLOGUARD: NEGATIVE

## 2021-11-26 ENCOUNTER — Other Ambulatory Visit: Payer: Self-pay | Admitting: Family Medicine

## 2021-11-26 ENCOUNTER — Other Ambulatory Visit: Payer: Self-pay | Admitting: Student

## 2021-12-07 ENCOUNTER — Ambulatory Visit (INDEPENDENT_AMBULATORY_CARE_PROVIDER_SITE_OTHER): Payer: Medicare PPO | Admitting: Adult Health

## 2021-12-07 ENCOUNTER — Encounter: Payer: Self-pay | Admitting: Adult Health

## 2021-12-07 VITALS — BP 124/79 | HR 107 | Ht 64.0 in | Wt 219.0 lb

## 2021-12-07 DIAGNOSIS — N9089 Other specified noninflammatory disorders of vulva and perineum: Secondary | ICD-10-CM

## 2021-12-07 DIAGNOSIS — L80 Vitiligo: Secondary | ICD-10-CM

## 2021-12-07 DIAGNOSIS — L900 Lichen sclerosus et atrophicus: Secondary | ICD-10-CM

## 2021-12-07 NOTE — Progress Notes (Signed)
?  Subjective:  ?  ? Patient ID: Alexis Hurst, female   DOB: Jan 26, 1951, 71 y.o.   MRN: [id number] ? ?HPI ?Alexis Hurst is a 71 year old black female, married, sp hysterectomy in complaining of vulva irritation, she has LSA and uses temovate 2-3 x weekly as needed. ?She is sexually active without pain but notices some burning when pee hits skin. ?PCP is Dr Moshe Cipro. ? ?Review of Systems ?Vulva irritation ?Some burning when pee hits skin ?No pain with sex ?Reviewed past medical,surgical, social and family history. Reviewed medications and allergies.  ?   ?Objective:  ? Physical Exam ?BP 124/79 (BP Location: Left Arm, Patient Position: Sitting, Cuff Size: Large)   Pulse (!) 107   Ht '5\' 4"'$  (1.626 m)   Wt 219 lb (99.3 kg)   BMI 37.59 kg/m?  Skin warm and dry. Lungs: clear to ausculation bilaterally. Cardiovascular: regular rate and rhythm.  ?  Pelvic: external genitalia is normal in appearance no lesions,has vitiligo,inner labia skin thin, vagina pale with loss of rugae,urethra has no lesions or masses noted, cervix and uterus are absent, adnexa: no masses or tenderness noted. Bladder is non tender and no masses felt. ?Fa;; risk is los ? [place] - 12/07/21 1509   ? ?  ? Pregnancy Intention Screening  ? Does the patient want to become pregnant in the next year? N/A   ? Does the patient's partner want to become pregnant in the next year? N/A   ? Would the patient like to discuss contraceptive options today? N/A   ?  ? Contraception Wrap Up  ? Current Method Female Sterilization   hyst  ? End Method Female Sterilization   hyst  ? Contraception Counseling Provided No   ? ?  ?  ? ?  ? Co exam with Balinda Quails NP student  ?Assessment:  ?   ?1. Lichen sclerosus et atrophicus ?Use temovate 2 x weekly,has refills  ? ?2. Vitiligo ? ?3. Vulvar irritation ?On days when not using temovate can use zinc oxide 2-3 x daily as needed  ?   ?Plan:  ?   ?Follow up prn  ?   ?

## 2022-01-01 ENCOUNTER — Other Ambulatory Visit: Payer: Self-pay | Admitting: Family Medicine

## 2022-01-09 DIAGNOSIS — E785 Hyperlipidemia, unspecified: Secondary | ICD-10-CM | POA: Diagnosis not present

## 2022-01-09 DIAGNOSIS — R7303 Prediabetes: Secondary | ICD-10-CM | POA: Diagnosis not present

## 2022-01-09 DIAGNOSIS — E559 Vitamin D deficiency, unspecified: Secondary | ICD-10-CM | POA: Diagnosis not present

## 2022-01-09 DIAGNOSIS — I10 Essential (primary) hypertension: Secondary | ICD-10-CM | POA: Diagnosis not present

## 2022-01-10 ENCOUNTER — Ambulatory Visit (INDEPENDENT_AMBULATORY_CARE_PROVIDER_SITE_OTHER): Payer: Medicare PPO | Admitting: Family Medicine

## 2022-01-10 ENCOUNTER — Encounter: Payer: Self-pay | Admitting: Family Medicine

## 2022-01-10 VITALS — BP 139/85 | HR 103 | Ht 64.0 in | Wt 213.0 lb

## 2022-01-10 DIAGNOSIS — R7303 Prediabetes: Secondary | ICD-10-CM

## 2022-01-10 DIAGNOSIS — I10 Essential (primary) hypertension: Secondary | ICD-10-CM | POA: Diagnosis not present

## 2022-01-10 DIAGNOSIS — R0683 Snoring: Secondary | ICD-10-CM | POA: Diagnosis not present

## 2022-01-10 DIAGNOSIS — F5104 Psychophysiologic insomnia: Secondary | ICD-10-CM | POA: Diagnosis not present

## 2022-01-10 DIAGNOSIS — E785 Hyperlipidemia, unspecified: Secondary | ICD-10-CM | POA: Diagnosis not present

## 2022-01-10 DIAGNOSIS — R5383 Other fatigue: Secondary | ICD-10-CM | POA: Diagnosis not present

## 2022-01-10 LAB — CBC
Hematocrit: 39 % (ref 34.0–46.6)
Hemoglobin: 12.6 g/dL (ref 11.1–15.9)
MCH: 28.6 pg (ref 26.6–33.0)
MCHC: 32.3 g/dL (ref 31.5–35.7)
MCV: 88 fL (ref 79–97)
Platelets: 330 10*3/uL (ref 150–450)
RBC: 4.41 x10E6/uL (ref 3.77–5.28)
RDW: 14 % (ref 11.7–15.4)
WBC: 6.8 10*3/uL (ref 3.4–10.8)

## 2022-01-10 LAB — CMP14+EGFR
ALT: 19 IU/L (ref 0–32)
AST: 17 IU/L (ref 0–40)
Albumin/Globulin Ratio: 1.2 (ref 1.2–2.2)
Albumin: 3.9 g/dL (ref 3.8–4.8)
Alkaline Phosphatase: 122 IU/L — ABNORMAL HIGH (ref 44–121)
BUN/Creatinine Ratio: 11 — ABNORMAL LOW (ref 12–28)
BUN: 10 mg/dL (ref 8–27)
Bilirubin Total: 0.3 mg/dL (ref 0.0–1.2)
CO2: 25 mmol/L (ref 20–29)
Calcium: 9 mg/dL (ref 8.7–10.3)
Chloride: 101 mmol/L (ref 96–106)
Creatinine, Ser: 0.95 mg/dL (ref 0.57–1.00)
Globulin, Total: 3.3 g/dL (ref 1.5–4.5)
Glucose: 93 mg/dL (ref 70–99)
Potassium: 3.9 mmol/L (ref 3.5–5.2)
Sodium: 139 mmol/L (ref 134–144)
Total Protein: 7.2 g/dL (ref 6.0–8.5)
eGFR: 65 mL/min/{1.73_m2} (ref >59)

## 2022-01-10 LAB — LIPID PANEL
Chol/HDL Ratio: 3.5 ratio (ref 0.0–4.4)
Cholesterol, Total: 203 mg/dL — ABNORMAL HIGH (ref 100–199)
HDL: 58 mg/dL (ref >39)
LDL Chol Calc (NIH): 129 mg/dL — ABNORMAL HIGH (ref 0–99)
Triglycerides: 90 mg/dL (ref 0–149)
VLDL Cholesterol Cal: 16 mg/dL (ref 5–40)

## 2022-01-10 LAB — VITAMIN D 25 HYDROXY (VIT D DEFICIENCY, FRACTURES): Vit D, 25-Hydroxy: 54.8 ng/mL (ref 30.0–100.0)

## 2022-01-10 LAB — HEMOGLOBIN A1C
Est. average glucose Bld gHb Est-mCnc: 120 mg/dL
Hgb A1c MFr Bld: 5.8 % — ABNORMAL HIGH (ref 4.8–5.6)

## 2022-01-10 LAB — TSH: TSH: 0.713 u[IU]/mL (ref 0.450–4.500)

## 2022-01-10 MED ORDER — WEGOVY 0.25 MG/0.5ML ~~LOC~~ SOAJ
0.2500 mg | SUBCUTANEOUS | 3 refills | Status: DC
Start: 2022-01-10 — End: 2022-05-16

## 2022-01-10 MED ORDER — TRAZODONE HCL 100 MG PO TABS: 100.0000 mg | ORAL_TABLET | Freq: Every day | ORAL | 1 refills | Status: DC

## 2022-01-10 MED ORDER — CLONIDINE HCL 0.2 MG PO TABS
0.2000 mg | ORAL_TABLET | Freq: Every day | ORAL | 3 refills | Status: DC
Start: 2022-01-10 — End: 2023-04-06

## 2022-01-10 MED ORDER — FLUTICASONE PROPIONATE 50 MCG/ACT NA SUSP: 2.0000 | Freq: Every day | NASAL | 6 refills | Status: AC

## 2022-01-10 NOTE — Progress Notes (Signed)
? ?Alexis Hurst     MRN: [id number]      DOB: 1951-05-28 ? ? ?HPI ?Alexis Hurst is here for follow up and re-evaluation of chronic medical conditions, medication management and review of any available recent lab and radiology data.  ?Preventive health is updated, specifically  Cancer screening and Immunization.   ?Questions or concerns regarding consultations or procedures which the PT has had in the interim are  addressed. ?The PT denies any adverse reactions to current medications since the last visit.  ?C/o chronic fatigue, stattes she is told that she snores, never feels rsted, has questions about hormones. ?C/o cough, no fever , chills,no sputum,  allergies are increased during this season and attribtes cough to this ? ?ROS ?Denies recent fever or chills. ?Denies sinus pressure, nasal congestion, ear pain or sore throat. ?Denies chest congestion, productive cough or wheezing. ?Denies chest pains, palpitations and leg swelling ?Denies abdominal pain, nausea, vomiting,diarrhea or constipation.   ?Denies dysuria, frequency, hesitancy or incontinence. ?Denies joint pain, swelling and limitation in mobility. ?Denies headaches, seizures, numbness, or tingling. ?Denies uncontrolled depression, anxiety c/o poor sleep, probably 4 hours on average per night ?Denies skin break down or rash. ? ? ?PE ? ?BP 139/85   Pulse (!) 103   Ht '5\' 4"'$  (1.626 m)   Wt 213 lb 0.6 oz (96.6 kg)   SpO2 95%   BMI 36.57 kg/m?  ? ?Patient alert and oriented and in no cardiopulmonary distress. ? ?HEENT: No facial asymmetry, EOMI,     Neck supple . ? ?Chest: Clear to auscultation bilaterally. ? ?CVS: S1, S2 no murmurs, no S3.Regular rate. ? ?ABD: Soft non tender.  ? ?Ext: No edema ? ?MS: Adequate ROM spine, shoulders, hips and knees. ? ?Skin: Intact, no ulcerations or rash noted. ? ?Psych: Good eye contact, normal affect. Memory intact not anxious or depressed appearing. ? ?CNS: CN 2-12 intact, power,  normal throughout.no focal deficits  noted. ? ? ?Assessment & Plan ? ?Morbid obesity ([place]) ?Improved, continue weekly wgovy ? ?Patient re-educated about  the importance of commitment to a  minimum of 150 minutes of exercise per week as able. ? ?The importance of healthy food choices with portion control discussed, as well as eating regularly and within a 12 hour window most days. ?The need to choose "clean , green" food 50 to 75% of the time is discussed, as well as to make water the primary drink and set a goal of 64 ounces water daily. ? ?  ? ?  01/10/2022  ? 10:43 AM 12/07/2021  ?  3:05 PM 11/02/2021  ?  1:33 PM  ?Weight /BMI  ?Weight 213 lb 0.6 oz 219 lb 221 lb 6.4 oz  ?Height '5\' 4"'$  (1.626 m) '5\' 4"'$  (1.626 m) '5\' 4"'$  (1.626 m)  ?BMI 36.57 kg/m2 37.59 kg/m2 38 kg/m2  ? ? ? ? ?Essential hypertension ?Controlled, no change in medication ?DASH diet and commitment to daily physical activity for a minimum of 30 minutes discussed and encouraged, as a part of hypertension management. ?The importance of attaining a healthy weight is also discussed. ? ? ?  01/10/2022  ? 10:43 AM 12/07/2021  ?  3:05 PM 11/02/2021  ?  1:33 PM 09/01/2021  ?  2:04 PM 09/01/2021  ?  1:40 PM 08/25/2021  ?  9:47 AM 06/29/2021  ? 11:03 AM  ?BP/Weight  ?Systolic BP 644 034 742 595 138 156 144  ?Diastolic BP 85 79 79 78 66 86 78  ?  Wt. (Lbs) 213.04 219 221.4  227 222.12 222  ?BMI 36.57 kg/m2 37.59 kg/m2 38 kg/m2  38.96 kg/m2 38.13 kg/m2 38.11 kg/m2  ? ? ? ? ? ?Insomnia ?Uncontrolled, inc trazodone to 100 mg daily ?Sleep hygiene reviewed and written information offered also. ?Prescription sent for  medication needed. ? ? ?Hyperlipidemia LDL goal <100 ?Hyperlipidemia:Low fat diet discussed and encouraged. ? ? ?Lipid Panel  ?Lab Results  ?Component Value Date  ? CHOL 203 (H) 01/09/2022  ? HDL 58 01/09/2022  ? LDLCALC 129 (H) 01/09/2022  ? TRIG 90 01/09/2022  ? CHOLHDL 3.5 01/09/2022  ? ? ? ?Need to reduce fat intake no med prescribed, improved ? ?Snoring ?Snoring with excessive fatigue, refer to eval  for sleep apnea ? ?

## 2022-01-10 NOTE — Assessment & Plan Note (Signed)
Controlled, no change in medication ?DASH diet and commitment to daily physical activity for a minimum of 30 minutes discussed and encouraged, as a part of hypertension management. ?The importance of attaining a healthy weight is also discussed. ? ? ?  01/10/2022  ? 10:43 AM 12/07/2021  ?  3:05 PM 11/02/2021  ?  1:33 PM 09/01/2021  ?  2:04 PM 09/01/2021  ?  1:40 PM 08/25/2021  ?  9:47 AM 06/29/2021  ? 11:03 AM  ?BP/Weight  ?Systolic BP 706 237 628 315 138 156 144  ?Diastolic BP 85 79 79 78 66 86 78  ?Wt. (Lbs) 213.04 219 221.4  227 222.12 222  ?BMI 36.57 kg/m2 37.59 kg/m2 38 kg/m2  38.96 kg/m2 38.13 kg/m2 38.11 kg/m2  ? ? ? ? ?

## 2022-01-10 NOTE — Assessment & Plan Note (Signed)
Improved, continue weekly wgovy ? ?Patient re-educated about  the importance of commitment to a  minimum of 150 minutes of exercise per week as able. ? ?The importance of healthy food choices with portion control discussed, as well as eating regularly and within a 12 hour window most days. ?The need to choose "clean , green" food 50 to 75% of the time is discussed, as well as to make water the primary drink and set a goal of 64 ounces water daily. ? ?  ? ?  01/10/2022  ? 10:43 AM 12/07/2021  ?  3:05 PM 11/02/2021  ?  1:33 PM  ?Weight /BMI  ?Weight 213 lb 0.6 oz 219 lb 221 lb 6.4 oz  ?Height '5\' 4"'$  (1.626 m) '5\' 4"'$  (1.626 m) '5\' 4"'$  (1.626 m)  ?BMI 36.57 kg/m2 37.59 kg/m2 38 kg/m2  ? ? ? ?

## 2022-01-10 NOTE — Assessment & Plan Note (Signed)
Hyperlipidemia:Low fat diet discussed and encouraged. ? ? ?Lipid Panel  ?Lab Results  ?Component Value Date  ? CHOL 203 (H) 01/09/2022  ? HDL 58 01/09/2022  ? LDLCALC 129 (H) 01/09/2022  ? TRIG 90 01/09/2022  ? CHOLHDL 3.5 01/09/2022  ? ? ? ?Need to reduce fat intake no med prescribed, improved ?

## 2022-01-10 NOTE — Assessment & Plan Note (Signed)
Snoring with excessive fatigue, refer to eval for sleep apnea ?

## 2022-01-10 NOTE — Patient Instructions (Addendum)
F/U in 4 months, call if you need me sooner ? ?Continue medications as discussed ? ?Increase dose of trazodone to 100 mg at bedtime ? ?It is important that you exercise regularly at least 30 minutes 5 times a week. If you develop chest pain, have severe difficulty breathing, or feel very tired, stop exercising immediately and seek medical attention  ? ?You are referred to Pulmonary eval for sleep apnea ? ?Non fasting HBA1C, chem 7 and EGFR 3 days before next visit ? ?Please get shingrix vaccines and covid  booster ? ? ?

## 2022-01-10 NOTE — Assessment & Plan Note (Signed)
Uncontrolled, inc trazodone to 100 mg daily ?Sleep hygiene reviewed and written information offered also. ?Prescription sent for  medication needed. ? ?

## 2022-01-13 ENCOUNTER — Other Ambulatory Visit: Payer: Self-pay | Admitting: Family Medicine

## 2022-01-17 DIAGNOSIS — H02832 Dermatochalasis of right lower eyelid: Secondary | ICD-10-CM | POA: Diagnosis not present

## 2022-01-17 DIAGNOSIS — R7309 Other abnormal glucose: Secondary | ICD-10-CM | POA: Diagnosis not present

## 2022-01-17 DIAGNOSIS — H35363 Drusen (degenerative) of macula, bilateral: Secondary | ICD-10-CM | POA: Diagnosis not present

## 2022-01-17 DIAGNOSIS — H04123 Dry eye syndrome of bilateral lacrimal glands: Secondary | ICD-10-CM | POA: Diagnosis not present

## 2022-01-20 ENCOUNTER — Other Ambulatory Visit: Payer: Self-pay | Admitting: Family Medicine

## 2022-03-01 NOTE — Progress Notes (Unsigned)
Cardiology Office Note    Date:  03/02/2022   ID:  Melinna, Linarez 1951-02-26, MRN [id number]  PCP:  Fayrene Helper, MD  Cardiologist: Rozann Lesches, MD    Chief Complaint  Patient presents with   Follow-up    6 month visit    History of Present Illness:    Alexis Hurst is a 71 y.o. female  with past medical history of palpitations (PAC's, PVC's and brief NSVT by prior monitor in 09/2020), HTN, HLD and Type 2 DM who presents to the office today for 26-monthfollow-up.  She was last examined by myself in 08/2021 and reported still having occasional palpitations but were triggered by caffeine consumption. She was planning to start a more routine exercise regimen. She was continued on her current medical therapy including Coreg 9.375 mg twice daily, Amlodipine 5 mg daily, Spironolactone 25 mg daily and Clonidine 0.2 mg twice daily.  In talking with the patient today, she reports overall doing well since her last office visit. She was started on Wegovy by her PCP a few months ago and she has actually lost 16 pounds since her last visit with [place] She has made dietary changes and has started participating more frequently in exercise classes at the [facility] center. She still has palpitations but says these spontaneously resolve and have not increased in frequency or severity. Symptoms are typically worse when she consumes soda or sweet tea. No recent chest pain or dyspnea on exertion. No specific orthopnea, PND or pitting edema. She does notice some swelling in her legs at the end of the day but symptoms have resolved by the following morning.   Past Medical History:  Diagnosis Date   Allergic sinusitis 12/18/2012   Allergic urticaria 04/02/2017   Anxiety    COVID-19 virus infection 09/05/2019   Depression    Diverticulosis 12/11/2013   Colonoscopy in 2014 per Dr FOneida Alar also has small hemmorhoids    Dizziness    Mild Orthostatic   Essential hypertension    Fatigue     History of chest pain    Negative stress echo 2010   Hyperlipidemia    Insomnia    Lab test positive for detection of COVID-19 virus 09/24/2019   Mitral valve prolapse    Non-allergic rhinitis 10/31/2017   Obesity    Palpitations    a. PAC's, PVC's and brief NSVT by prior monitor in 09/2020   Paresthesias    Upper extremity /episode sensation of coldness and numbness    Piles (hemorrhoids) 07/20/2015   Raynauds phenomenon    Possible   Type 2 diabetes mellitus ([facility]     Past Surgical History:  Procedure Laterality Date   ABDOMINAL HYSTERECTOMY     BREAST EXCISIONAL BIOPSY Left    BREAST SURGERY     CATARACT EXTRACTION Right 02/03/2020   CATARACT EXTRACTION Left 02/09/2020   COLONOSCOPY  06/11/2003   [facility]:multiple medium scatered diverticula in the cecum, a sending colon, transverse colon, descending colon, sigmoid colon.   COLONOSCOPY N/A 05/20/2013   TICS, SML IH   Cystectomy L breast, benign     VESICOVAGINAL FISTULA CLOSURE W/ TAH  1989    Current Medications: Outpatient Medications Prior to Visit  Medication Sig Dispense Refill   albuterol (VENTOLIN HFA) 108 (90 Base) MCG/ACT inhaler Inhale 2 puffs into the lungs every 6 (six) hours as needed for wheezing or shortness of breath. 8 g 6   amLODipine (NORVASC) 5 MG tablet Take 1 tablet (5  mg total) by mouth daily. 90 tablet 3   aspirin 81 MG EC tablet Take 81 mg by mouth daily.     azelastine (ASTELIN) 0.1 % nasal spray Place 2 sprays into both nostrils 2 (two) times daily. Use in each nostril as directed (Patient taking differently: Place 2 sprays into both nostrils as needed. Use in each nostril as directed) 30 mL 12   Calcium Carbonate-Vitamin D 600-200 MG-UNIT TABS Take 1 tablet by mouth daily.     carvedilol (COREG) 6.25 MG tablet TAKE 1.5 TABLET BY MOUTH TWICE DAILY WITH A MEAL 270 tablet 2   clobetasol ointment (TEMOVATE) 0.05 % Apply bid to affected area for 2 weeks then 3 x a week 45 g 3   cloNIDine (CATAPRES)  0.2 MG tablet Take 1 tablet (0.2 mg total) by mouth daily. 90 tablet 3   FLUoxetine (PROZAC) 40 MG capsule TAKE 1 CAPSULE(40 MG) BY MOUTH DAILY 90 capsule 3   fluticasone (FLONASE) 50 MCG/ACT nasal spray Place 2 sprays into both nostrils daily. 16 g 6   montelukast (SINGULAIR) 10 MG tablet TAKE 1 TABLET(10 MG) BY MOUTH AT BEDTIME 90 tablet 0   pantoprazole (PROTONIX) 40 MG tablet TAKE 1 TABLET(40 MG) BY MOUTH TWICE DAILY BEFORE A MEAL 180 tablet 1   Semaglutide-Weight Management (WEGOVY) 0.25 MG/0.5ML SOAJ Inject 0.25 mg into the skin once a week. 2 mL 3   spironolactone (ALDACTONE) 25 MG tablet TAKE 1 TABLET(25 MG) BY MOUTH DAILY 90 tablet 3   Tiotropium Bromide Monohydrate (SPIRIVA RESPIMAT) 1.25 MCG/ACT AERS Inhale 2 puffs into the lungs daily. 4 g 0   traZODone (DESYREL) 100 MG tablet Take 1 tablet (100 mg total) by mouth at bedtime. 90 tablet 1   venlafaxine XR (EFFEXOR-XR) 75 MG 24 hr capsule TAKE 1 CAPSULE(75 MG) BY MOUTH DAILY WITH BREAKFAST 90 capsule 1   Vitamin D, Ergocalciferol, (DRISDOL) 1.25 MG (50000 UNIT) CAPS capsule TAKE 1 CAPSULE BY MOUTH 1 TIME A WEEK 12 capsule 1   WEGOVY 0.5 MG/0.5ML SOAJ INJECT 0.5 MG INTO THE SKIN ONCE A WEEK 2 mL 2   WEGOVY 0.25 MG/0.5ML SOAJ INJECT 0.'25MG'$  INTO THE SKIN ONCE A WEEK (Patient not taking: Reported on 03/02/2022) 2 mL 0   No facility-administered medications prior to visit.     Allergies:   Olmesartan and Lactose intolerance (gi)   Social History   Socioeconomic History   Marital status: Married    Spouse name: Alvino   Number of children: 2   Years of education: Not on file   Highest education level: Not on file  Occupational History   Occupation: Pharmacist, hospital   Tobacco Use   Smoking status: Former    Packs/day: 0.50    Years: 16.00    Total pack years: 8.00    Types: Cigarettes    Start date: 71    Quit date: 09/04/1986    Years since quitting: 35.5   Smokeless tobacco: Never  Vaping Use   Vaping Use: Never used  Substance  and Sexual Activity   Alcohol use: Yes    Alcohol/week: 0.0 standard drinks of alcohol    Comment: occ   Drug use: No   Sexual activity: Yes    Birth control/protection: Surgical    Comment: hyst  Other Topics Concern   Not on file  Social History Narrative   1 son, 1 daughter   9 grandchildren.   Social Determinants of Health   Financial Resource Strain: Low Risk  (06/29/2021)  Overall Financial Resource Strain (CARDIA)    Difficulty of Paying Living Expenses: Not hard at all  Food Insecurity: No Food Insecurity (06/29/2021)   Hunger Vital Sign    Worried About Running Out of Food in the Last Year: Never true    Ran Out of Food in the Last Year: Never true  Transportation Needs: No Transportation Needs (06/29/2021)   PRAPARE - Hydrologist (Medical): No    Lack of Transportation (Non-Medical): No  Physical Activity: Sufficiently Active (06/29/2021)   Exercise Vital Sign    Days of Exercise per Week: 5 days    Minutes of Exercise per Session: 30 min  Stress: No Stress Concern Present (06/29/2021)   [place]    Feeling of Stress : Only a little  Social Connections: Socially Integrated (06/29/2021)   Social Connection and Isolation Panel [NHANES]    Frequency of Communication with Friends and Family: More than three times a week    Frequency of Social Gatherings with Friends and Family: More than three times a week    Attends Religious Services: More than 4 times per year    Active Member of [employer] or Organizations: Yes    Attends Music therapist: More than 4 times per year    Marital Status: Married     Family History:  The patient's family history includes Asthma in her sister; Breast cancer in her daughter; Colon cancer in her maternal grandfather; Crohn's disease in her brother; Diabetes in her brother; Heart attack in her mother; Heart disease in her father and  sister; Heart failure in her father; Hypertension in her brother, mother, sister, and sister; Lung cancer in her father; Urticaria in her mother.   Review of Systems:    Please see the history of present illness.     All other systems reviewed and are otherwise negative except as noted above.   Physical Exam:    VS:  BP 122/76   Pulse 98   Ht '5\' 4"'$  (1.626 m)   Wt 211 lb (95.7 kg)   SpO2 97%   BMI 36.22 kg/m    General: Well developed, well nourished,female appearing in no acute distress. Head: Normocephalic, atraumatic. Neck: No carotid bruits. JVD not elevated.  Lungs: Respirations regular and unlabored, without wheezes or rales.  Heart: Regular rate and rhythm. No S3 or S4.  No murmur, no rubs, or gallops appreciated. Abdomen: Appears non-distended. No obvious abdominal masses. Msk:  Strength and tone appear normal for age. No obvious joint deformities or effusions. Extremities: No clubbing or cyanosis. No pitting edema.  Distal pedal pulses are 2+ bilaterally. Neuro: Alert and oriented X 3. Moves all extremities spontaneously. No focal deficits noted. Psych:  Responds to questions appropriately with a normal affect. Skin: No rashes or lesions noted  Wt Readings from Last 3 Encounters:  03/02/22 211 lb (95.7 kg)  01/10/22 213 lb 0.6 oz (96.6 kg)  12/07/21 219 lb (99.3 kg)     Studies/Labs Reviewed:   EKG:  EKG is not ordered today.   Recent Labs: 01/09/2022: ALT 19; BUN 10; Creatinine, Ser 0.95; Hemoglobin 12.6; Platelets 330; Potassium 3.9; Sodium 139; TSH 0.713   Lipid Panel    Component Value Date/Time   CHOL 203 (H) 01/09/2022 1104   TRIG 90 01/09/2022 1104   HDL 58 01/09/2022 1104   CHOLHDL 3.5 01/09/2022 1104   CHOLHDL 2.2 01/30/2020 1121   VLDL 19  03/28/2017 1023   LDLCALC 129 (H) 01/09/2022 1104   LDLCALC 71 01/30/2020 1121    Additional studies/ records that were reviewed today include:   Echocardiogram: 01/2019 IMPRESSIONS     1. The left  ventricle has normal systolic function with an ejection  fraction of 60-65%. The cavity size was normal. There is moderately  increased left ventricular wall thickness. Left ventricular diastolic  Doppler parameters are consistent with impaired   relaxation. No evidence of left ventricular regional wall motion  abnormalities.   2. The right ventricle has normal systolic function. The cavity was  normal. There is no increase in right ventricular wall thickness. Right  ventricular systolic pressure normal with an estimated pressure of 27.6  mmHg.   3. Trivial pericardial effusion is present.   4. The aortic valve is tricuspid. Mild aortic annular calcification  noted.   5. The mitral valve is grossly normal. There is mild mitral annular  calcification present.   6. The tricuspid valve is grossly normal.   7. The aortic root is normal in size and structure.   NST: 01/2019 No diagnostic ST changes to indicate ischemia. Small, mild intensity, partially reversible apical anterior defect that is consistent with breast attenuation. This is a low risk study. Nuclear stress EF: 75%.  Event Monitor: 09/2020 [employer] monitor reviewed, 13 days 8 hours analyzed.  Predominant rhythm is sinus with heart rate ranging from 77 bpm up to 146 bpm and average heart rate 96 bpm.  Rare PACs were noted representing less than 1% total beats.  Rare PVCs were noted representing less than 1% total beats.  3 beat episode of NSVT noted.  Patient triggered event corresponded to normal sinus rhythm.  There were no sustained arrhythmias or pauses.  Assessment:    1. Palpitations   2. Essential hypertension   3. Mixed hyperlipidemia   4. Lower extremity edema      Plan:   In order of problems listed above:  1. Palpitations - She has known PAC's, PVC's and brief NSVT by prior monitor in 09/2020. TSH and electrolytes were checked last month and in a normal range. She reports symptoms have overall been well  controlled but she does notice more frequent episodes if she consumes caffeine. Given no progression of symptoms, will continue Coreg at current dosing of 9.375 mg twice daily.  2. HTN - BP is well controlled at 122/76 during today's visit. Continue current medical therapy with Amlodipine 5 mg daily, Coreg 9.375 mg twice daily, Clonidine 0.2 mg daily and Spironolactone 25 mg daily.  We reviewed that if she has continued weight loss and associated reduction in BP, could possibly stop Amlodipine in the future.  3. HLD - FLP in 01/2022 showed total cholesterol 203, triglycerides 90, HDL 58 and LDL 129. This has improved from prior readings and she has focused on weight loss and dietary changes instead of being on statin therapy.  4. Lower Extremity Edema - Was more prominent when previously on Amlodipine '10mg'$  daily.  Does experience some dependent edema at the end of the day but no pitting edema. Remains on Spironolactone 25 mg daily.     Medication Adjustments/Labs and Tests Ordered: Current medicines are reviewed at length with the patient today.  Concerns regarding medicines are outlined above.  Medication changes, Labs and Tests ordered today are listed in the Patient Instructions below. Patient Instructions  Medication Instructions:  Your physician recommends that you continue on your current medications as directed. Please refer to  the Current Medication list given to you today.  *If you need a refill on your cardiac medications before your next appointment, please call your pharmacy*   Lab Work: NONE   If you have labs (blood work) drawn today and your tests are completely normal, you will receive your results only by: [place] (if you have MyChart) OR A paper copy in the mail If you have any lab test that is abnormal or we need to change your treatment, we will call you to review the results.   Testing/Procedures: NONE    Follow-Up: At [facility], you and your  health needs are our priority.  As part of our continuing mission to provide you with exceptional heart care, we have created designated Provider Care Teams.  These Care Teams include your primary Cardiologist (physician) and Advanced Practice Providers (APPs -  Physician Assistants and Nurse Practitioners) who all work together to provide you with the care you need, when you need it.  We recommend signing up for the patient portal called "MyChart".  Sign up information is provided on this After Visit Summary.  MyChart is used to connect with patients for Virtual Visits (Telemedicine).  Patients are able to view lab/test results, encounter notes, upcoming appointments, etc.  Non-urgent messages can be sent to your provider as well.   To learn more about what you can do with MyChart, go to [email or web address].    Your next appointment:   6 month(s)  The format for your next appointment:   In Person  Provider:   Rozann Lesches, MD or Bernerd Pho, PA-C    Other Instructions Thank you for choosing [facility]!  \  Important Information About Sugar         Signed, Erma Heritage, PA-C  03/02/2022 1:32 PM    [place] [address] [place], [place] [address] Phone: [phone] Fax: [phone]

## 2022-03-02 ENCOUNTER — Institutional Professional Consult (permissible substitution): Payer: Medicare PPO | Admitting: Pulmonary Disease

## 2022-03-02 ENCOUNTER — Ambulatory Visit (INDEPENDENT_AMBULATORY_CARE_PROVIDER_SITE_OTHER): Payer: Medicare PPO | Admitting: Student

## 2022-03-02 ENCOUNTER — Encounter: Payer: Self-pay | Admitting: Student

## 2022-03-02 VITALS — BP 122/76 | HR 98 | Ht 64.0 in | Wt 211.0 lb

## 2022-03-02 DIAGNOSIS — I10 Essential (primary) hypertension: Secondary | ICD-10-CM | POA: Diagnosis not present

## 2022-03-02 DIAGNOSIS — R600 Localized edema: Secondary | ICD-10-CM | POA: Diagnosis not present

## 2022-03-02 DIAGNOSIS — E782 Mixed hyperlipidemia: Secondary | ICD-10-CM

## 2022-03-02 DIAGNOSIS — R002 Palpitations: Secondary | ICD-10-CM

## 2022-03-02 NOTE — Patient Instructions (Signed)
Medication Instructions:  Your physician recommends that you continue on your current medications as directed. Please refer to the Current Medication list given to you today.  *If you need a refill on your cardiac medications before your next appointment, please call your pharmacy*   Lab Work: NONE   If you have labs (blood work) drawn today and your tests are completely normal, you will receive your results only by: [place] (if you have MyChart) OR A paper copy in the mail If you have any lab test that is abnormal or we need to change your treatment, we will call you to review the results.   Testing/Procedures: NONE    Follow-Up: At [facility], you and your health needs are our priority.  As part of our continuing mission to provide you with exceptional heart care, we have created designated Provider Care Teams.  These Care Teams include your primary Cardiologist (physician) and Advanced Practice Providers (APPs -  Physician Assistants and Nurse Practitioners) who all work together to provide you with the care you need, when you need it.  We recommend signing up for the patient portal called "MyChart".  Sign up information is provided on this After Visit Summary.  MyChart is used to connect with patients for Virtual Visits (Telemedicine).  Patients are able to view lab/test results, encounter notes, upcoming appointments, etc.  Non-urgent messages can be sent to your provider as well.   To learn more about what you can do with MyChart, go to [email or web address].    Your next appointment:   6 month(s)  The format for your next appointment:   In Person  Provider:   Rozann Lesches, MD or Bernerd Pho, PA-C    Other Instructions Thank you for choosing [facility]!  \  Important Information About Sugar

## 2022-03-16 ENCOUNTER — Encounter: Payer: Self-pay | Admitting: Family Medicine

## 2022-03-29 DIAGNOSIS — J309 Allergic rhinitis, unspecified: Secondary | ICD-10-CM | POA: Diagnosis not present

## 2022-03-29 DIAGNOSIS — E669 Obesity, unspecified: Secondary | ICD-10-CM | POA: Diagnosis not present

## 2022-03-29 DIAGNOSIS — E559 Vitamin D deficiency, unspecified: Secondary | ICD-10-CM | POA: Diagnosis not present

## 2022-03-29 DIAGNOSIS — F324 Major depressive disorder, single episode, in partial remission: Secondary | ICD-10-CM | POA: Diagnosis not present

## 2022-03-29 DIAGNOSIS — F411 Generalized anxiety disorder: Secondary | ICD-10-CM | POA: Diagnosis not present

## 2022-03-29 DIAGNOSIS — L439 Lichen planus, unspecified: Secondary | ICD-10-CM | POA: Diagnosis not present

## 2022-03-29 DIAGNOSIS — K219 Gastro-esophageal reflux disease without esophagitis: Secondary | ICD-10-CM | POA: Diagnosis not present

## 2022-03-29 DIAGNOSIS — G4700 Insomnia, unspecified: Secondary | ICD-10-CM | POA: Diagnosis not present

## 2022-03-29 DIAGNOSIS — I10 Essential (primary) hypertension: Secondary | ICD-10-CM | POA: Diagnosis not present

## 2022-04-06 IMAGING — MG DIGITAL SCREENING BILAT W/ TOMO W/ CAD
8 series · 8 of 24 positions shown · non-contrast
Comparison: Previous exam(s).

CLINICAL DATA: Screening.

EXAM:
DIGITAL SCREENING BILATERAL MAMMOGRAM WITH TOMO AND CAD

[R CC synth-2D]
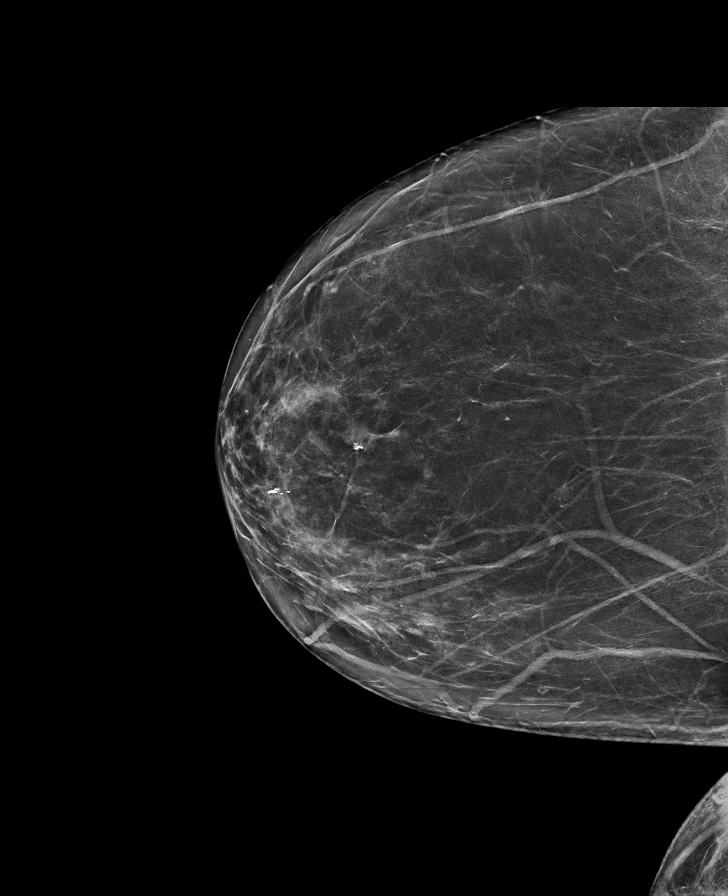

[L CC synth-2D]
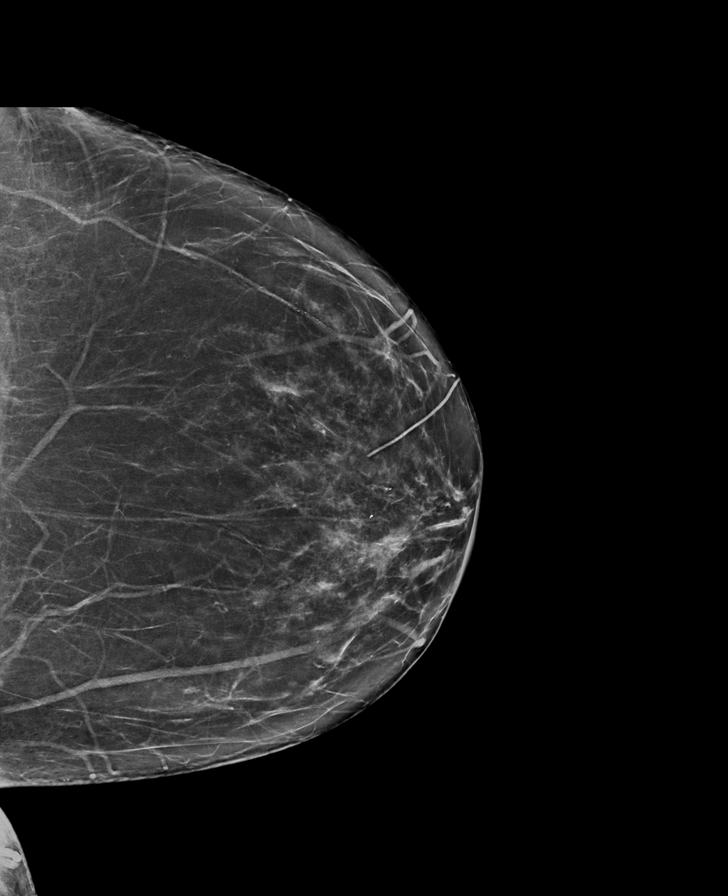

[L MLO synth-2D]
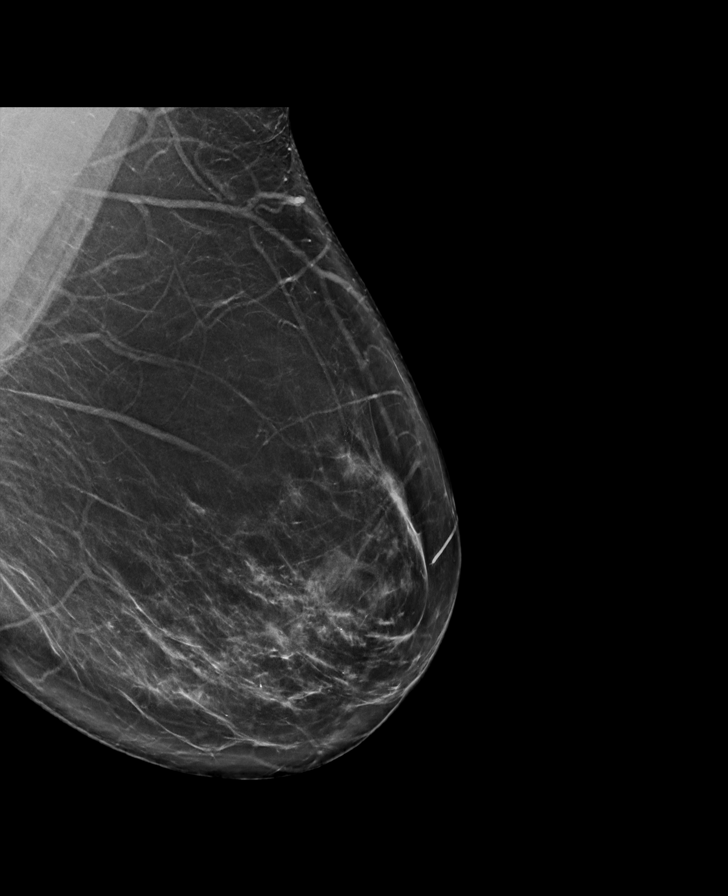

[R MLO synth-2D]
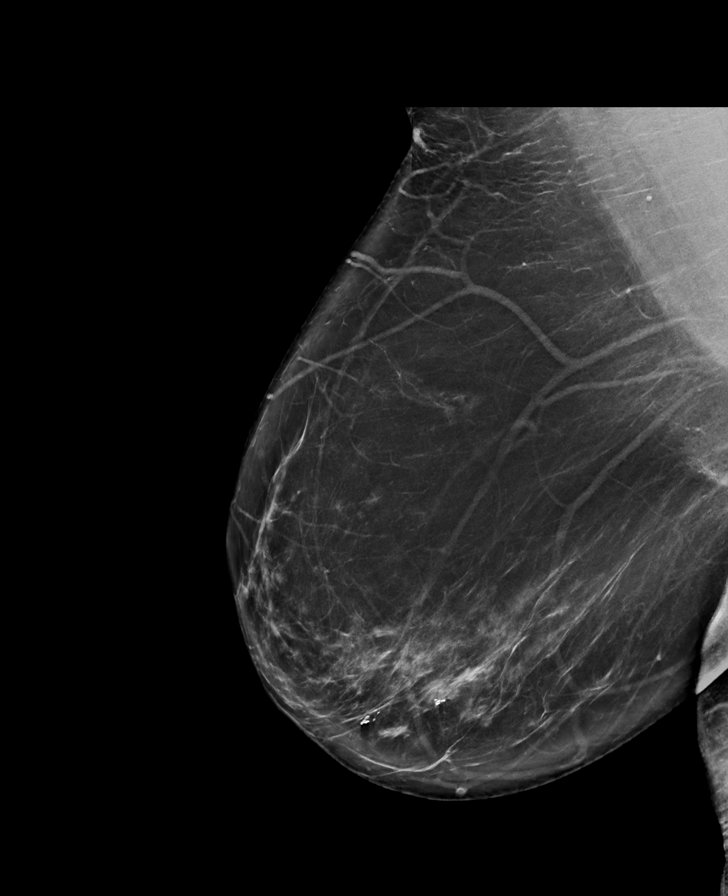

[R CC tomo · tomo slice 39/78.0]
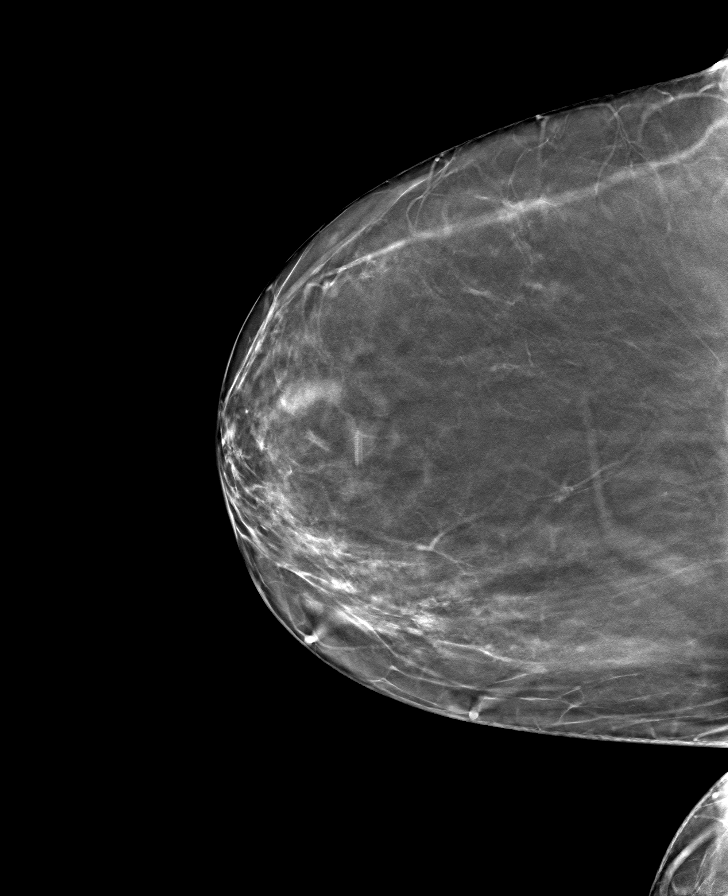

[L CC tomo · tomo slice 37/74.0]
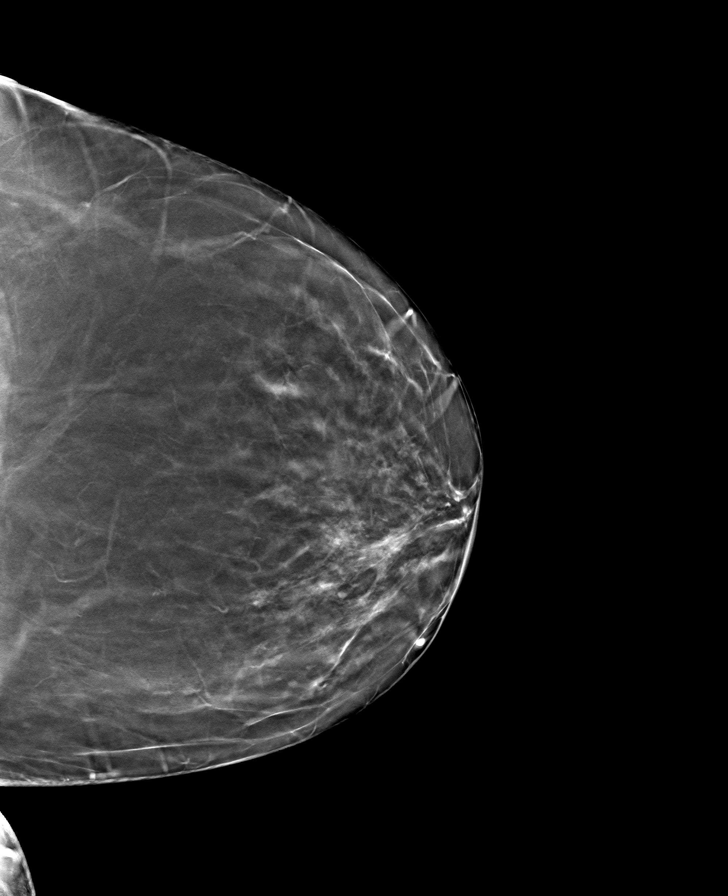

[L MLO tomo · tomo slice 43/85.0]
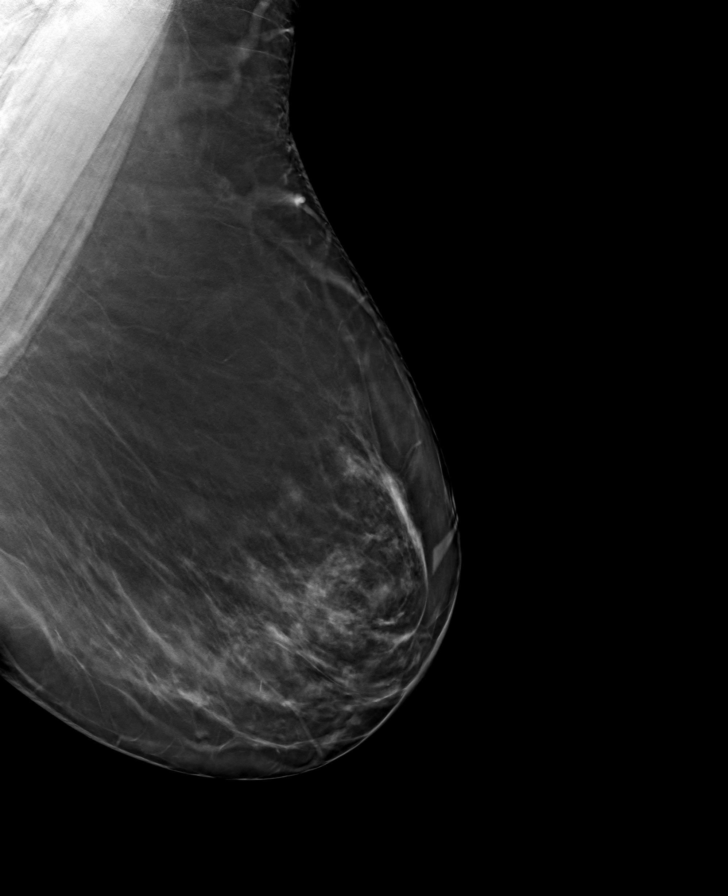

[R MLO tomo · tomo slice 47/92.0]
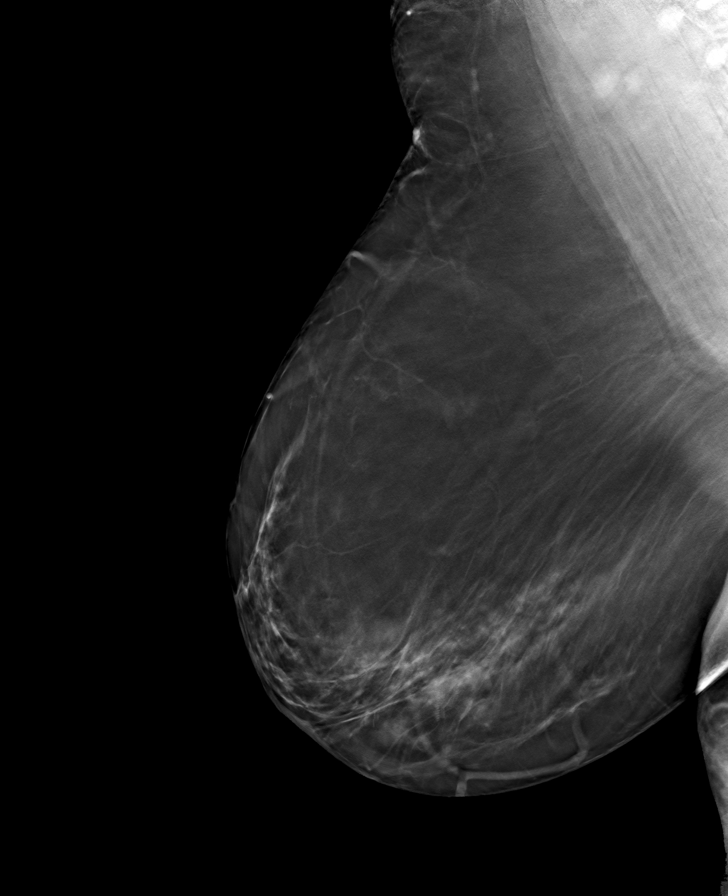

[8 of 24 positions shown; findings below may reference images not displayed]

ACR Breast Density Category b: There are scattered areas of
fibroglandular density.
FINDINGS: In the right breast, a possible mass warrants further evaluation. In
the left breast, no findings suspicious for malignancy. Images were
processed with CAD.
IMPRESSION: Further evaluation is suggested for a possible mass in the right
breast.

RECOMMENDATION:
Diagnostic mammogram and possibly ultrasound of the right breast.
(Code:E5-X-CC4)

The patient will be contacted regarding the findings, and additional
imaging will be scheduled.

BI-RADS CATEGORY  0: Incomplete. Need additional imaging evaluation
and/or prior mammograms for comparison.

## 2022-04-21 ENCOUNTER — Other Ambulatory Visit: Payer: Self-pay | Admitting: Family Medicine

## 2022-04-25 ENCOUNTER — Other Ambulatory Visit: Payer: Self-pay | Admitting: Gastroenterology

## 2022-04-25 DIAGNOSIS — K219 Gastro-esophageal reflux disease without esophagitis: Secondary | ICD-10-CM

## 2022-04-27 ENCOUNTER — Ambulatory Visit (INDEPENDENT_AMBULATORY_CARE_PROVIDER_SITE_OTHER): Payer: Medicare PPO | Admitting: Pulmonary Disease

## 2022-04-27 ENCOUNTER — Encounter: Payer: Self-pay | Admitting: Pulmonary Disease

## 2022-04-27 VITALS — BP 132/68 | HR 102 | Temp 98.4°F | Ht 64.0 in | Wt 211.4 lb

## 2022-04-27 DIAGNOSIS — R0683 Snoring: Secondary | ICD-10-CM

## 2022-04-27 NOTE — Assessment & Plan Note (Signed)
We just gust the nonrefreshing sleep could be due to her poor sleep hygiene. We discussed avoiding screen time during her nocturnal awakenings. Given her persistent fatigue and mild snoring noted by husband, it is reasonable to clarify with a home sleep test although pretest probability is low. It is more likely that persistent fatigue is related to clonidine and Coreg type medications   The pathophysiology of obstructive sleep apnea , it's cardiovascular consequences & modes of treatment including CPAP were discused with the patient in detail & they evidenced understanding.

## 2022-04-27 NOTE — Patient Instructions (Signed)
  X home sleep test  Avoid screens at night

## 2022-04-27 NOTE — Progress Notes (Signed)
Subjective:    Patient ID: Alexis Hurst, female    DOB: [birth year], 71 y.o.   MRN: [id number]  HPI  Chief Complaint  Patient presents with   Consult    Ref by Dr. 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 Epworth sleepiness score is 5 and she reports sleepiness while lying down to rest in the afternoon Bedtime is between 11 PM and midnight, sleep latency can be up to an hour, she sleeps on her side with 2 pillows, reports 2-3 nocturnal awakenings for nocturia.  Occasionally she will stay up on her phone playing a game of Scrabble or reading till she finally falls asleep in 30 to 60 minutes.  She is out of bed around 10 AM with feeling tired without dryness of mouth or headaches. There is no history suggestive of cataplexy, sleep paralysis or parasomnias  She retired in 2010-11-27, she was primary caregiver for her 36 year old mom who died in 11/27/18  She was evaluated for cough in 27-Nov-2018 , PFTs were normal, for some reason she is still maintained on Spiriva  PMH -hypertension, on Coreg and clonidine Persistent tachycardia, TSH normal  Significant tests/ events reviewed  PFTs 05/2019 normal     Social History: Smoked as a teenager. Quit in 1980s. School teacher    Past Medical History:  Diagnosis Date   Allergic sinusitis 12/18/2012   Allergic urticaria 04/02/2017   Anxiety    COVID-19 virus infection 09/05/2019   Depression    Diverticulosis 12/11/2013   Colonoscopy in 2012-11-26 per Dr Oneida Alar, also has small hemmorhoids    Dizziness    Mild Orthostatic   Essential hypertension    Fatigue    History of chest pain    Negative stress echo 11-26-08   Hyperlipidemia    Insomnia    Lab test positive for detection of COVID-19 virus 09/24/2019   Mitral valve prolapse    Non-allergic rhinitis 10/31/2017   Obesity    Palpitations    a. PAC's, PVC's and brief  NSVT by prior monitor in 09/2020   Paresthesias    Upper extremity /episode sensation of coldness and numbness    Piles (hemorrhoids) 07/20/2015   Raynauds phenomenon    Possible   Type 2 diabetes mellitus ([place])    Past Surgical History:  Procedure Laterality Date   ABDOMINAL HYSTERECTOMY     BREAST EXCISIONAL BIOPSY Left    BREAST SURGERY     CATARACT EXTRACTION Right 02/03/2020   CATARACT EXTRACTION Left 02/09/2020   COLONOSCOPY  06/11/2003   Smith:multiple medium scatered diverticula in the cecum, a sending colon, transverse colon, descending colon, sigmoid colon.   COLONOSCOPY N/A 05/20/2013   TICS, SML IH   Cystectomy L breast, benign     VESICOVAGINAL FISTULA CLOSURE W/ TAH  1989    Allergies  Allergen Reactions   Olmesartan Cough   Lactose Intolerance (Gi) Diarrhea and Nausea And Vomiting    Social History   Socioeconomic History   Marital status: Married    Spouse name: Alvino   Number of children: 2   Years of education: Not on file   Highest education level: Not on file  Occupational History   Occupation: Pharmacist, hospital   Tobacco Use   Smoking status: Former    Packs/day: 0.50    Years: 16.00    Total pack years: 8.00    Types: Cigarettes    Start  date: 45    Quit date: 09/04/1986    Years since quitting: 35.6   Smokeless tobacco: Never  Vaping Use   Vaping Use: Never used  Substance and Sexual Activity   Alcohol use: Yes    Alcohol/week: 0.0 standard drinks of alcohol    Comment: occ   Drug use: No   Sexual activity: Yes    Birth control/protection: Surgical    Comment: hyst  Other Topics Concern   Not on file  Social History Narrative   1 son, 1 daughter   9 grandchildren.   Social Determinants of Health   Financial Resource Strain: Low Risk  (06/29/2021)   Overall Financial Resource Strain (CARDIA)    Difficulty of Paying Living Expenses: Not hard at all  Food Insecurity: No Food Insecurity (06/29/2021)   Hunger Vital Sign    Worried About  Running Out of Food in the Last Year: Never true    Ran Out of Food in the Last Year: Never true  Transportation Needs: No Transportation Needs (06/29/2021)   PRAPARE - Hydrologist (Medical): No    Lack of Transportation (Non-Medical): No  Physical Activity: Sufficiently Active (06/29/2021)   Exercise Vital Sign    Days of Exercise per Week: 5 days    Minutes of Exercise per Session: 30 min  Stress: No Stress Concern Present (06/29/2021)   [place]    Feeling of Stress : Only a little  Social Connections: Socially Integrated (06/29/2021)   Social Connection and Isolation Panel [NHANES]    Frequency of Communication with Friends and Family: More than three times a week    Frequency of Social Gatherings with Friends and Family: More than three times a week    Attends Religious Services: More than 4 times per year    Active Member of [employer] or Organizations: Yes    Attends Music therapist: More than 4 times per year    Marital Status: Married  Human resources officer Violence: Not At Risk (06/29/2021)   Humiliation, Afraid, Rape, and Kick questionnaire    Fear of Current or Ex-Partner: No    Emotionally Abused: No    Physically Abused: No    Sexually Abused: No    Family History  Problem Relation Age of Onset   Hypertension Mother    Urticaria Mother    Heart attack Mother    Heart failure Father    Lung cancer Father    Heart disease Father    Asthma Sister    Heart disease Sister    Hypertension Brother    Crohn's disease Brother    Diabetes Brother    Hypertension Sister    Hypertension Sister    Colon cancer Maternal Grandfather        Age greater than 38   Breast cancer Daughter    Allergic rhinitis Neg Hx    Angioedema Neg Hx    Atopy Neg Hx    Eczema Neg Hx    Immunodeficiency Neg Hx       Review of Systems Constitutional: negative for anorexia, fevers  and sweats  Eyes: negative for irritation, redness and visual disturbance  Ears, nose, mouth, throat, and face: negative for earaches, epistaxis, nasal congestion and sore throat  Respiratory: negative for cough, dyspnea on exertion, sputum and wheezing  Cardiovascular: negative for chest pain, dyspnea, lower extremity edema, orthopnea, palpitations and syncope  Gastrointestinal: negative for abdominal pain, constipation,  diarrhea, melena, nausea and vomiting  Genitourinary:negative for dysuria, frequency and hematuria  Hematologic/lymphatic: negative for bleeding, easy bruising and lymphadenopathy  Musculoskeletal:negative for arthralgias, muscle weakness and stiff joints  Neurological: negative for coordination problems, gait problems, headaches and weakness  Endocrine: negative for diabetic symptoms including polydipsia, polyuria and weight loss     Objective:   Physical Exam  Gen. Pleasant, obese, in no distress, normal affect ENT - no pallor,icterus, no post nasal drip, class 2-3 airway Neck: No JVD, no thyromegaly, no carotid bruits Lungs: no use of accessory muscles, no dullness to percussion, decreased without rales or rhonchi  Cardiovascular: Rhythm regular, heart sounds  normal, no murmurs or gallops, no peripheral edema Abdomen: soft and non-tender, no hepatosplenomegaly, BS normal. Musculoskeletal: No deformities, no cyanosis or clubbing Neuro:  alert, non focal, no tremors       Assessment & Plan:    Dyspnea -likely related to deconditioning, ex-smoker but lung function is normal.  Spiriva can be discontinued

## 2022-05-01 NOTE — Telephone Encounter (Signed)
Pt was made aware and verbalized understanding.  

## 2022-05-01 NOTE — Telephone Encounter (Signed)
She needs an ov.  One refill to be given.

## 2022-05-15 NOTE — Telephone Encounter (Signed)
OV made °

## 2022-05-16 ENCOUNTER — Other Ambulatory Visit (HOSPITAL_COMMUNITY): Payer: Self-pay | Admitting: Family Medicine

## 2022-05-16 ENCOUNTER — Encounter: Payer: Self-pay | Admitting: Family Medicine

## 2022-05-16 ENCOUNTER — Ambulatory Visit (INDEPENDENT_AMBULATORY_CARE_PROVIDER_SITE_OTHER): Payer: Medicare PPO | Admitting: Family Medicine

## 2022-05-16 VITALS — BP 132/80 | HR 106 | Ht 64.0 in | Wt 208.0 lb

## 2022-05-16 DIAGNOSIS — R7303 Prediabetes: Secondary | ICD-10-CM

## 2022-05-16 DIAGNOSIS — J309 Allergic rhinitis, unspecified: Secondary | ICD-10-CM | POA: Diagnosis not present

## 2022-05-16 DIAGNOSIS — F418 Other specified anxiety disorders: Secondary | ICD-10-CM

## 2022-05-16 DIAGNOSIS — D229 Melanocytic nevi, unspecified: Secondary | ICD-10-CM | POA: Diagnosis not present

## 2022-05-16 DIAGNOSIS — Z23 Encounter for immunization: Secondary | ICD-10-CM

## 2022-05-16 DIAGNOSIS — S058X2A Other injuries of left eye and orbit, initial encounter: Secondary | ICD-10-CM | POA: Diagnosis not present

## 2022-05-16 DIAGNOSIS — Z1231 Encounter for screening mammogram for malignant neoplasm of breast: Secondary | ICD-10-CM

## 2022-05-16 DIAGNOSIS — I10 Essential (primary) hypertension: Secondary | ICD-10-CM

## 2022-05-16 MED ORDER — SEMAGLUTIDE-WEIGHT MANAGEMENT 1 MG/0.5ML ~~LOC~~ SOAJ: 1.0000 mg | SUBCUTANEOUS | 0 refills | Status: DC

## 2022-05-16 NOTE — Assessment & Plan Note (Addendum)
Recent outbreak of excess moles under breasts in past several months, one in particular extremely hyperpigmented with variation in color, dernmatology to eval

## 2022-05-16 NOTE — Progress Notes (Signed)
Alexis Hurst     MRN: [id number]      DOB: 07/28/1951   HPI Alexis Hurst is here for follow up and re-evaluation of chronic medical conditions, medication management and review of any available recent lab and radiology data.  Preventive health is updated, specifically  Cancer screening and Immunization.   Accidental hit I left eye 3 days ago, slight discomfort, no bleeding, no perceived vision change  C/o excess moles appearing under breasts in past several months also concerned that one is very dark  ROS Denies recent fever or chills. Denies sinus pressure, nasal congestion, ear pain or sore throat. Denies chest congestion, productive cough or wheezing. Denies chest pains, palpitations and leg swelling Denies abdominal pain, nausea, vomiting,diarrhea or constipation.   Denies dysuria, frequency, hesitancy or incontinence. Denies joint pain, swelling and limitation in mobility. Denies headaches, seizures, numbness, or tingling. Denies depression, anxiety or insomnia. Denies skin break down or rash.   PE  BP 132/80 (BP Location: Left Arm, Patient Position: Sitting, Cuff Size: Large)   Pulse (!) 106   Ht '5\' 4"'$  (1.626 m)   Wt 208 lb (94.3 kg)   SpO2 95%   BMI 35.70 kg/m   Patient alert and oriented and in no cardiopulmonary distress.  HEENT: No facial asymmetry, EOMI,     Neck supple .No bruising or skin breakdown over / around left eye, no significant swelling, no conjunctival erythema, EOMI, tender over lateral left eyebrow  Chest: Clear to auscultation bilaterally.  CVS: S1, S2 no murmurs, no S3.Regular rate.  ABD: Soft non tender.   Ext: No edema  MS: Adequate ROM spine, shoulders, hips and knees.  Skin: Intact, no ulcerations or rash noted.Multiple hyperpigmented moles under breasts,varied pigmentation  Psych: Good eye contact, normal affect. Memory intact not anxious or depressed appearing.  CNS: CN 2-12 intact, power,  normal throughout.no focal deficits  noted.   Assessment & Plan Morbid obesity [facility])  Patient re-educated about  the importance of commitment to a  minimum of 150 minutes of exercise per week as able.  The importance of healthy food choices with portion control discussed, as well as eating regularly and within a 12 hour window most days. The need to choose "clean , green" food 50 to 75% of the time is discussed, as well as to make water the primary drink and set a goal of 64 ounces water daily.       05/16/2022   10:54 AM 04/27/2022    9:21 AM 03/02/2022   12:56 PM  Weight /BMI  Weight 208 lb 211 lb 6.4 oz 211 lb  Height '5\' 4"'$  (1.626 m) '5\' 4"'$  (1.626 m) '5\' 4"'$  (1.626 m)  BMI 35.7 kg/m2 36.29 kg/m2 36.22 kg/m2    Increase wegovy dose  Multiple atypical skin moles Recent outbreak of excess moles under breasts in past several months, one in particular extremely hyperpigmented with variation in color, dernmatology to eval  Prediabetes Patient educated about the importance of limiting  Carbohydrate intake , the need to commit to daily physical activity for a minimum of 30 minutes , and to commit weight loss. The fact that changes in all these areas will reduce or eliminate all together the development of diabetes is stressed.      Latest Ref Rng & Units 01/09/2022   11:04 AM 08/24/2021   11:48 AM 05/13/2021   10:26 AM 12/10/2020   11:35 AM 06/15/2020    4:47 PM  Diabetic Labs  HbA1c 4.8 -  5.6 % 5.8  6.3  6.4  6.4  6.3   Chol 100 - 199 mg/dL 203  220  227  225    HDL >39 mg/dL 58  67  71  81    Calc LDL 0 - 99 mg/dL 129  136  138  130    Triglycerides 0 - 149 mg/dL 90  99  104  78    Creatinine 0.57 - 1.00 mg/dL 0.95  0.91  0.86  0.99  1.03       05/16/2022   10:54 AM 04/27/2022    9:21 AM 03/02/2022   12:56 PM 01/10/2022   10:43 AM 12/07/2021    3:05 PM 11/02/2021    1:33 PM 09/01/2021    2:04 PM  BP/Weight  Systolic BP 518 841 660 630 160 109 323  Diastolic BP 80 68 76 85 79 79 78  Wt. (Lbs) 208 211.4 211 213.04 219  221.4   BMI 35.7 kg/m2 36.29 kg/m2 36.22 kg/m2 36.57 kg/m2 37.59 kg/m2 38 kg/m2       Latest Ref Rng & Units 01/18/2021   12:00 AM 01/19/2020   12:00 AM  Foot/eye exam completion dates  Eye Exam No Retinopathy No Retinopathy     Retinopathy         This result is from an external source.       Essential hypertension Controlled, no change in medication DASH diet and commitment to daily physical activity for a minimum of 30 minutes discussed and encouraged, as a part of hypertension management. The importance of attaining a healthy weight is also discussed.     05/16/2022   10:54 AM 04/27/2022    9:21 AM 03/02/2022   12:56 PM 01/10/2022   10:43 AM 12/07/2021    3:05 PM 11/02/2021    1:33 PM 09/01/2021    2:04 PM  BP/Weight  Systolic BP 557 322 025 427 062 376 283  Diastolic BP 80 68 76 85 79 79 78  Wt. (Lbs) 208 211.4 211 213.04 219 221.4   BMI 35.7 kg/m2 36.29 kg/m2 36.22 kg/m2 36.57 kg/m2 37.59 kg/m2 38 kg/m2        Allergic sinusitis Controlled, no change in medication   Depression with anxiety Controlled, no change in medication   Blunt eye trauma, left, initial encounter No damage to eye on exam, no signmificant soft tissue injury periorbitally, no change in acuity

## 2022-05-16 NOTE — Assessment & Plan Note (Addendum)
No damage to eye on exam, no signmificant soft tissue injury periorbitally, no change in acuity

## 2022-05-16 NOTE — Assessment & Plan Note (Signed)
Controlled, no change in medication  

## 2022-05-16 NOTE — Assessment & Plan Note (Signed)
  Patient re-educated about  the importance of commitment to a  minimum of 150 minutes of exercise per week as able.  The importance of healthy food choices with portion control discussed, as well as eating regularly and within a 12 hour window most days. The need to choose "clean , green" food 50 to 75% of the time is discussed, as well as to make water the primary drink and set a goal of 64 ounces water daily.       05/16/2022   10:54 AM 04/27/2022    9:21 AM 03/02/2022   12:56 PM  Weight /BMI  Weight 208 lb 211 lb 6.4 oz 211 lb  Height '5\' 4"'$  (1.626 m) '5\' 4"'$  (1.626 m) '5\' 4"'$  (1.626 m)  BMI 35.7 kg/m2 36.29 kg/m2 36.22 kg/m2    Increase wegovy dose

## 2022-05-16 NOTE — Patient Instructions (Addendum)
F/u IN 3 MONTHS, CALL IF YOU NEED ME SOONER   PLS SEND MESSAGE IN 3 WEEKS SO I CAN KNOW IF YOU DO WELL ON THE HIGHER DOSE OF WEGOVY, NEW DOSE IS 1 MG WEEKLY  PLEASE SCHEDULE DECEMBER MAMMOGRAM AT CHECKOUT, MORNING APPT PLS  FLU VACCINE TODAY  NO EYE TRAUMA OR BRUISING WHERE YOU WERE KICKED, VISION SCREEN BEFORE YOU LEAVE TODAY  It is important that you exercise regularly at least 30 minutes 5 times a week. If you develop chest pain, have severe difficulty breathing, or feel very tired, stop exercising immediately and seek medical attention   Thanks for choosing [facility], we consider it a privelige to serve you.

## 2022-05-16 NOTE — Assessment & Plan Note (Signed)
Patient educated about the importance of limiting  Carbohydrate intake , the need to commit to daily physical activity for a minimum of 30 minutes , and to commit weight loss. The fact that changes in all these areas will reduce or eliminate all together the development of diabetes is stressed.      Latest Ref Rng & Units 01/09/2022   11:04 AM 08/24/2021   11:48 AM 05/13/2021   10:26 AM 12/10/2020   11:35 AM 06/15/2020    4:47 PM  Diabetic Labs  HbA1c 4.8 - 5.6 % 5.8  6.3  6.4  6.4  6.3   Chol 100 - 199 mg/dL 203  220  227  225    HDL >39 mg/dL 58  67  71  81    Calc LDL 0 - 99 mg/dL 129  136  138  130    Triglycerides 0 - 149 mg/dL 90  99  104  78    Creatinine 0.57 - 1.00 mg/dL 0.95  0.91  0.86  0.99  1.03       05/16/2022   10:54 AM 04/27/2022    9:21 AM 03/02/2022   12:56 PM 01/10/2022   10:43 AM 12/07/2021    3:05 PM 11/02/2021    1:33 PM 09/01/2021    2:04 PM  BP/Weight  Systolic BP 283 151 761 607 371 062 694  Diastolic BP 80 68 76 85 79 79 78  Wt. (Lbs) 208 211.4 211 213.04 219 221.4   BMI 35.7 kg/m2 36.29 kg/m2 36.22 kg/m2 36.57 kg/m2 37.59 kg/m2 38 kg/m2       Latest Ref Rng & Units 01/18/2021   12:00 AM 01/19/2020   12:00 AM  Foot/eye exam completion dates  Eye Exam No Retinopathy No Retinopathy     Retinopathy         This result is from an external source.

## 2022-05-16 NOTE — Assessment & Plan Note (Signed)
Controlled, no change in medication DASH diet and commitment to daily physical activity for a minimum of 30 minutes discussed and encouraged, as a part of hypertension management. The importance of attaining a healthy weight is also discussed.     05/16/2022   10:54 AM 04/27/2022    9:21 AM 03/02/2022   12:56 PM 01/10/2022   10:43 AM 12/07/2021    3:05 PM 11/02/2021    1:33 PM 09/01/2021    2:04 PM  BP/Weight  Systolic BP 622 297 989 211 941 740 814  Diastolic BP 80 68 76 85 79 79 78  Wt. (Lbs) 208 211.4 211 213.04 219 221.4   BMI 35.7 kg/m2 36.29 kg/m2 36.22 kg/m2 36.57 kg/m2 37.59 kg/m2 38 kg/m2

## 2022-05-17 LAB — BMP8+EGFR
BUN/Creatinine Ratio: 9 — ABNORMAL LOW (ref 12–28)
BUN: 8 mg/dL (ref 8–27)
CO2: 22 mmol/L (ref 20–29)
Calcium: 9.3 mg/dL (ref 8.7–10.3)
Chloride: 103 mmol/L (ref 96–106)
Creatinine, Ser: 0.88 mg/dL (ref 0.57–1.00)
Glucose: 89 mg/dL (ref 70–99)
Potassium: 4.3 mmol/L (ref 3.5–5.2)
Sodium: 142 mmol/L (ref 134–144)
eGFR: 72 mL/min/{1.73_m2} (ref >59)

## 2022-05-17 LAB — HEMOGLOBIN A1C
Est. average glucose Bld gHb Est-mCnc: 128 mg/dL
Hgb A1c MFr Bld: 6.1 % — ABNORMAL HIGH (ref 4.8–5.6)

## 2022-06-05 ENCOUNTER — Ambulatory Visit
Admission: EM | Admit: 2022-06-05 | Discharge: 2022-06-05 | Disposition: A | Discharge status: Home / Self Care | Payer: Medicare PPO | Attending: Nurse Practitioner | Admitting: Nurse Practitioner

## 2022-06-05 DIAGNOSIS — J069 Acute upper respiratory infection, unspecified: Secondary | ICD-10-CM | POA: Insufficient documentation

## 2022-06-05 DIAGNOSIS — Z1152 Encounter for screening for COVID-19: Secondary | ICD-10-CM | POA: Diagnosis not present

## 2022-06-05 MED ORDER — BENZONATATE 100 MG PO CAPS: 100.0000 mg | ORAL_CAPSULE | Freq: Three times a day (TID) | ORAL | 0 refills | Status: DC | PRN

## 2022-06-05 NOTE — Discharge Instructions (Addendum)
You have a viral upper respiratory infection.  This should improve over the next week to 10 days.  If your symptoms do not improve or if they worsen in that timeframe, follow-up with [place] or primary care provider.  With any sudden onset of chest pain or shortness of breath, go to emergency room.    We have tested you today for COVID-19.  If you test positive, we will prescribe an antiviral called molnupiravir.  You will see the results in Mychart and we will call you with positive results.    Please stay home and isolate until you are aware of the results.    Some things that can make you feel better are: - Increased rest - Increasing fluid with water/sugar free electrolytes - Acetaminophen and ibuprofen as needed for fever/pain  - Salt water gargling, chloraseptic spray and throat lozenges - OTC guaifenesin (Mucinex) 600 mg twice a day - Saline sinus flushes or a neti pot - Humidifying the air -Tessalon Perles as needed for dry cough

## 2022-06-05 NOTE — ED Triage Notes (Signed)
Pt states that she has some nasal congestion, coughing and watery eyes. X 2 days  Pt states that she is vaccinated for covid.

## 2022-06-05 NOTE — ED Provider Notes (Signed)
[facility]    CSN: [id number] Arrival date & time: 06/05/22  1338      History   Chief Complaint Chief Complaint  Patient presents with   Nasal Congestion    Nasal congestion, cough, and watery eyes. X2 days    HPI Alexis Hurst is a 71 y.o. female.   Patient presents with a few days of productive cough, chest pain with coughing nasal and chest congestion, decreased appetite, and fatigue.  She denies shortness of breath, wheezing, chest pain at rest, chest tightness, sore throat, headache, sinus pressure, ear pain or drainage, abdominal pain, nausea/vomiting, diarrhea, loss of taste or smell, and new rash.  Denies known sick contacts.  Has been taking Mucinex a.m./p.m. with relief, although does not know if she should take this medicine because of her medical history.     Past Medical History:  Diagnosis Date   Allergic sinusitis 12/18/2012   Allergic urticaria 04/02/2017   Anxiety    COVID-19 virus infection 09/05/2019   Depression    Diverticulosis 12/11/2013   Colonoscopy in 2014 per Dr Oneida Alar, also has small hemmorhoids    Dizziness    Mild Orthostatic   Essential hypertension    Fatigue    History of chest pain    Negative stress echo 2010   Hyperlipidemia    Insomnia    Lab test positive for detection of COVID-19 virus 09/24/2019   Mitral valve prolapse    Non-allergic rhinitis 10/31/2017   Obesity    Palpitations    a. PAC's, PVC's and brief NSVT by prior monitor in 09/2020   Paresthesias    Upper extremity /episode sensation of coldness and numbness    Piles (hemorrhoids) 07/20/2015   Raynauds phenomenon    Possible   Type 2 diabetes mellitus ([place])     Patient Active Problem List   Diagnosis Date Noted   Multiple atypical skin moles 05/16/2022   Blunt eye trauma, left, initial encounter 05/16/2022   Snoring 01/10/2022   Vulvar irritation 12/07/2021   Vitamin D deficiency 11/07/2021   Onychomycosis 31/54/0086   Lichen sclerosus  et atrophicus 09/23/2020   Vitiligo 09/23/2020   Fatigue due to excessive exertion 08/20/2020   Subcutaneous nodule of right upper extremity 04/18/2020   GERD (gastroesophageal reflux disease) 02/09/2019   Abnormal CT scan, chest 02/09/2019   Hot flashes due to menopause 06/23/2018   Venom-induced anaphylaxis 10/31/2017   Transaminitis 76/19/5093   Metabolic syndrome X 26/71/2458   Jaw closure abnormality 05/26/2016   Hyperlipidemia LDL goal <100 02/15/2015   Back pain with radiation 07/21/2014   FH: colon cancer 04/30/2013   DDD (degenerative disc disease), lumbosacral 01/21/2013   Allergic sinusitis 12/18/2012   Prediabetes 05/28/2011   ARTHRITIS 04/11/2010   Palpitations 06/08/2009   FATIGUE 02/26/2009   Morbid obesity ([place]) 02/04/2008   Depression with anxiety 02/04/2008   Essential hypertension 02/04/2008   Insomnia 02/04/2008    Past Surgical History:  Procedure Laterality Date   ABDOMINAL HYSTERECTOMY     BREAST EXCISIONAL BIOPSY Left    BREAST SURGERY     CATARACT EXTRACTION Right 02/03/2020   CATARACT EXTRACTION Left 02/09/2020   COLONOSCOPY  06/11/2003   Smith:multiple medium scatered diverticula in the cecum, a sending colon, transverse colon, descending colon, sigmoid colon.   COLONOSCOPY N/A 05/20/2013   TICS, SML IH   Cystectomy L breast, benign     VESICOVAGINAL FISTULA CLOSURE W/ TAH  1989    OB History  Gravida  2   Para  2   Term  2   Preterm      AB      Living  2      SAB      IAB      Ectopic      Multiple      Live Births  2            Home Medications    Prior to Admission medications   Medication Sig Start Date End Date Taking? Authorizing Provider  albuterol (VENTOLIN HFA) 108 (90 Base) MCG/ACT inhaler Inhale 2 puffs into the lungs every 6 (six) hours as needed for wheezing or shortness of breath. 09/23/19  Yes Margaretha Seeds, MD  aspirin 81 MG EC tablet Take 81 mg by mouth daily.   Yes [provider]   azelastine (ASTELIN) 0.1 % nasal spray Place 2 sprays into both nostrils 2 (two) times daily. Use in each nostril as directed Patient taking differently: Place 2 sprays into both nostrils as needed. Use in each nostril as directed 08/25/21  Yes Fayrene Helper, MD  benzonatate (TESSALON) 100 MG capsule Take 1 capsule (100 mg total) by mouth 3 (three) times daily as needed for cough. Do not take with alcohol or while driving or operating heavy machinery.  May cause drowsiness. 06/05/22  Yes Eulogio Bear, NP  Calcium Carbonate-Vitamin D 600-200 MG-UNIT TABS Take 1 tablet by mouth daily.   Yes [provider]  carvedilol (COREG) 6.25 MG tablet TAKE 1.5 TABLET BY MOUTH TWICE DAILY WITH A MEAL 11/28/21  Yes Strader, [place] M, PA-C  clobetasol ointment (TEMOVATE) 0.05 % Apply bid to affected area for 2 weeks then 3 x a week 09/23/20  Yes Derrek Monaco A, NP  cloNIDine (CATAPRES) 0.2 MG tablet Take 1 tablet (0.2 mg total) by mouth daily. 01/10/22  Yes Fayrene Helper, MD  FLUoxetine (PROZAC) 40 MG capsule TAKE 1 CAPSULE(40 MG) BY MOUTH DAILY 11/02/21  Yes Fayrene Helper, MD  fluticasone [facility]) 50 MCG/ACT nasal spray Place 2 sprays into both nostrils daily. 01/10/22  Yes Fayrene Helper, MD  montelukast (SINGULAIR) 10 MG tablet TAKE 1 TABLET(10 MG) BY MOUTH AT BEDTIME 04/21/22  Yes Fayrene Helper, MD  pantoprazole (PROTONIX) 40 MG tablet TAKE 1 TABLET(40 MG) BY MOUTH TWICE DAILY BEFORE A MEAL 05/01/22  Yes Mahala Menghini, PA-C  Semaglutide-Weight Management 1 MG/0.5ML SOAJ Inject 1 mg into the skin once a week. 05/16/22  Yes Fayrene Helper, MD  spironolactone (ALDACTONE) 25 MG tablet TAKE 1 TABLET(25 MG) BY MOUTH DAILY 07/07/21  Yes Fayrene Helper, MD  Tiotropium Bromide Monohydrate (SPIRIVA RESPIMAT) 1.25 MCG/ACT AERS Inhale 2 puffs into the lungs daily. 06/02/19  Yes Margaretha Seeds, MD  traZODone (DESYREL) 100 MG tablet Take 1 tablet (100 mg total) by mouth at  bedtime. 01/10/22  Yes Fayrene Helper, MD  venlafaxine XR (EFFEXOR-XR) 75 MG 24 hr capsule TAKE 1 CAPSULE(75 MG) BY MOUTH DAILY WITH BREAKFAST 01/02/22  Yes Fayrene Helper, MD  Vitamin D, Ergocalciferol, (DRISDOL) 1.25 MG (50000 UNIT) CAPS capsule TAKE 1 CAPSULE BY MOUTH 1 TIME A WEEK 11/28/21  Yes Fayrene Helper, MD  amLODipine (NORVASC) 5 MG tablet Take 1 tablet (5 mg total) by mouth daily. 11/02/21 05/16/22  Fayrene Helper, MD    Family History Family History  Problem Relation Age of Onset   Hypertension Mother    Urticaria Mother  Heart attack Mother    Heart failure Father    Lung cancer Father    Heart disease Father    Asthma Sister    Heart disease Sister    Hypertension Brother    Crohn's disease Brother    Diabetes Brother    Hypertension Sister    Hypertension Sister    Colon cancer Maternal Grandfather        Age greater than 75   Breast cancer Daughter    Allergic rhinitis Neg Hx    Angioedema Neg Hx    Atopy Neg Hx    Eczema Neg Hx    Immunodeficiency Neg Hx     Social History Social History   Tobacco Use   Smoking status: Former    Packs/day: 0.50    Years: 16.00    Total pack years: 8.00    Types: Cigarettes    Start date: 10    Quit date: 09/04/1986    Years since quitting: 35.7   Smokeless tobacco: Never  Vaping Use   Vaping Use: Never used  Substance Use Topics   Alcohol use: Yes    Alcohol/week: 0.0 standard drinks of alcohol    Comment: occ   Drug use: No     Allergies   Olmesartan and Lactose intolerance (gi)   Review of Systems Review of Systems Per HPI  Physical Exam Triage Vital Signs ED Triage Vitals  Enc Vitals Group     BP 06/05/22 1408 133/87     Pulse Rate 06/05/22 1408 (!) 110     Resp 06/05/22 1408 18     Temp 06/05/22 1408 99.6 F (37.6 C)     Temp Source 06/05/22 1408 Oral     SpO2 06/05/22 1408 95 %     Weight 06/05/22 1407 210 lb (95.3 kg)     Height 06/05/22 1407 '5\' 4"'$  (1.626 m)     Head  Circumference --      Peak Flow --      Pain Score 06/05/22 1407 0     Pain Loc --      Pain Edu? --      Excl. in [place]? --    No data found.  Updated Vital Signs BP 133/87   Pulse (!) 110   Temp 99.6 F (37.6 C) (Oral)   Resp 18   Ht '5\' 4"'$  (1.626 m)   Wt 210 lb (95.3 kg)   SpO2 95%   BMI 36.05 kg/m   Visual Acuity Right Eye Distance:   Left Eye Distance:   Bilateral Distance:    Right Eye Near:   Left Eye Near:    Bilateral Near:     Physical Exam Vitals and nursing note reviewed.  Constitutional:      General: She is not in acute distress.    Appearance: Normal appearance. She is not ill-appearing or toxic-appearing.  HENT:     Head: Normocephalic and atraumatic.     Right Ear: Tympanic membrane, ear canal and external ear normal.     Left Ear: Tympanic membrane, ear canal and external ear normal.     Nose: Congestion and rhinorrhea present.     Mouth/Throat:     Mouth: Mucous membranes are moist.     Pharynx: Oropharynx is clear. Posterior oropharyngeal erythema present. No oropharyngeal exudate.  Eyes:     General: No scleral icterus.    Extraocular Movements: Extraocular movements intact.  Cardiovascular:     Rate and Rhythm: Normal rate  and regular rhythm.  Pulmonary:     Effort: Pulmonary effort is normal. No respiratory distress.     Breath sounds: Normal breath sounds. No wheezing, rhonchi or rales.  Abdominal:     General: Abdomen is flat. Bowel sounds are normal. There is no distension.     Palpations: Abdomen is soft.  Musculoskeletal:     Cervical back: Normal range of motion and neck supple.  Lymphadenopathy:     Cervical: No cervical adenopathy.  Skin:    General: Skin is warm and dry.     Coloration: Skin is not jaundiced or pale.     Findings: No erythema or rash.  Neurological:     Mental Status: She is alert and oriented to person, place, and time.     Motor: No weakness.  Psychiatric:        Behavior: Behavior is cooperative.       UC Treatments / Results  Labs (all labs ordered are listed, but only abnormal results are displayed) Labs Reviewed  SARS CORONAVIRUS 2 (TAT 6-24 HRS)    EKG   Radiology No results found.  Procedures Procedures (including critical care time)  Medications Ordered in UC Medications - No data to display  Initial Impression / Assessment and Plan / UC Course  I have reviewed the triage vital signs and the nursing notes.  Pertinent labs & imaging results that were available during my care of the patient were reviewed by me and considered in my medical decision making (see chart for details).    Patient is well-appearing, normotensive, afebrile, not tachypneic, oxygenating well on room air.  She is mildly tachycardic, likely secondary to phenylephrine use and over-the-counter medication.  Discouraged use of multi symptoms treatment medicine.  Symptoms are consistent with viral respiratory infection.  COVID-19 testing obtained.  She is a candidate for molnupiravir if she test positive.  Supportive care discussed.  Start [employer]  ER and return precautions discussed.  The patient was given the opportunity to ask questions.  All questions answered to their satisfaction.  The patient is in agreement to this plan.    Final Clinical Impressions(s) / UC Diagnoses   Final diagnoses:  Viral URI with cough  Encounter for screening for COVID-19     Discharge Instructions      You have a viral upper respiratory infection.  This should improve over the next week to 10 days.  If your symptoms do not improve or if they worsen in that timeframe, follow-up with [place] or primary care provider.  With any sudden onset of chest pain or shortness of breath, go to emergency room.    We have tested you today for COVID-19.  If you test positive, we will prescribe an antiviral called molnupiravir.  You will see the results in Mychart and we will call you with positive results.    Please stay home  and isolate until you are aware of the results.    Some things that can make you feel better are: - Increased rest - Increasing fluid with water/sugar free electrolytes - Acetaminophen and ibuprofen as needed for fever/pain  - Salt water gargling, chloraseptic spray and throat lozenges - OTC guaifenesin (Mucinex) 600 mg twice a day - Saline sinus flushes or a neti pot - Humidifying the air -Tessalon Perles as needed for dry cough    ED Prescriptions     Medication Sig Dispense Auth. Provider   benzonatate (TESSALON) 100 MG capsule Take 1 capsule (100 mg  total) by mouth 3 (three) times daily as needed for cough. Do not take with alcohol or while driving or operating heavy machinery.  May cause drowsiness. 21 capsule Eulogio Bear, NP      PDMP not reviewed this encounter.   Eulogio Bear, NP 06/05/22 1428

## 2022-06-06 ENCOUNTER — Telehealth (HOSPITAL_COMMUNITY): Payer: Self-pay | Admitting: Emergency Medicine

## 2022-06-06 LAB — SARS CORONAVIRUS 2 (TAT 6-24 HRS): SARS Coronavirus 2: POSITIVE — AB

## 2022-06-11 NOTE — Progress Notes (Unsigned)
GI Office Note    Referring Provider: Fayrene Helper, MD Primary Care Physician:  Fayrene Helper, MD  Primary Gastroenterologist: Elon Alas. Abbey Chatters, DO   Chief Complaint   No chief complaint on file.   History of Present Illness   Alexis Hurst is a 71 y.o. female presenting today for follow up of GERD. Last seen in 03/2020.   Last colonoscopy 05/2013, diverticulosis and hemorrhoids. Next colonoscopy due in 05/2023.         Medications   Current Outpatient Medications  Medication Sig Dispense Refill   albuterol (VENTOLIN HFA) 108 (90 Base) MCG/ACT inhaler Inhale 2 puffs into the lungs every 6 (six) hours as needed for wheezing or shortness of breath. 8 g 6   amLODipine (NORVASC) 5 MG tablet Take 1 tablet (5 mg total) by mouth daily. 90 tablet 3   aspirin 81 MG EC tablet Take 81 mg by mouth daily.     azelastine (ASTELIN) 0.1 % nasal spray Place 2 sprays into both nostrils 2 (two) times daily. Use in each nostril as directed (Patient taking differently: Place 2 sprays into both nostrils as needed. Use in each nostril as directed) 30 mL 12   benzonatate (TESSALON) 100 MG capsule Take 1 capsule (100 mg total) by mouth 3 (three) times daily as needed for cough. Do not take with alcohol or while driving or operating heavy machinery.  May cause drowsiness. 21 capsule 0   Calcium Carbonate-Vitamin D 600-200 MG-UNIT TABS Take 1 tablet by mouth daily.     carvedilol (COREG) 6.25 MG tablet TAKE 1.5 TABLET BY MOUTH TWICE DAILY WITH A MEAL 270 tablet 2   clobetasol ointment (TEMOVATE) 0.05 % Apply bid to affected area for 2 weeks then 3 x a week 45 g 3   cloNIDine (CATAPRES) 0.2 MG tablet Take 1 tablet (0.2 mg total) by mouth daily. 90 tablet 3   FLUoxetine (PROZAC) 40 MG capsule TAKE 1 CAPSULE(40 MG) BY MOUTH DAILY 90 capsule 3   fluticasone (FLONASE) 50 MCG/ACT nasal spray Place 2 sprays into both nostrils daily. 16 g 6   montelukast (SINGULAIR) 10 MG tablet TAKE 1  TABLET(10 MG) BY MOUTH AT BEDTIME 90 tablet 0   pantoprazole (PROTONIX) 40 MG tablet TAKE 1 TABLET(40 MG) BY MOUTH TWICE DAILY BEFORE A MEAL 180 tablet 0   Semaglutide-Weight Management 1 MG/0.5ML SOAJ Inject 1 mg into the skin once a week. 2 mL 0   spironolactone (ALDACTONE) 25 MG tablet TAKE 1 TABLET(25 MG) BY MOUTH DAILY 90 tablet 3   Tiotropium Bromide Monohydrate (SPIRIVA RESPIMAT) 1.25 MCG/ACT AERS Inhale 2 puffs into the lungs daily. 4 g 0   traZODone (DESYREL) 100 MG tablet Take 1 tablet (100 mg total) by mouth at bedtime. 90 tablet 1   venlafaxine XR (EFFEXOR-XR) 75 MG 24 hr capsule TAKE 1 CAPSULE(75 MG) BY MOUTH DAILY WITH BREAKFAST 90 capsule 1   Vitamin D, Ergocalciferol, (DRISDOL) 1.25 MG (50000 UNIT) CAPS capsule TAKE 1 CAPSULE BY MOUTH 1 TIME A WEEK 12 capsule 1   No current facility-administered medications for this visit.    Allergies   Allergies as of 06/12/2022 - Review Complete 06/05/2022  Allergen Reaction Noted   Olmesartan Cough 02/05/2019   Lactose intolerance (gi) Diarrhea and Nausea And Vomiting 05/07/2013     Past Medical History   Past Medical History:  Diagnosis Date   Allergic sinusitis 12/18/2012   Allergic urticaria 04/02/2017   Anxiety  COVID-19 virus infection 09/05/2019   Depression    Diverticulosis 12/11/2013   Colonoscopy in 2014 per Dr Oneida Alar, also has small hemmorhoids    Dizziness    Mild Orthostatic   Essential hypertension    Fatigue    History of chest pain    Negative stress echo 2010   Hyperlipidemia    Insomnia    Lab test positive for detection of COVID-19 virus 09/24/2019   Mitral valve prolapse    Non-allergic rhinitis 10/31/2017   Obesity    Palpitations    a. PAC's, PVC's and brief NSVT by prior monitor in 09/2020   Paresthesias    Upper extremity /episode sensation of coldness and numbness    Piles (hemorrhoids) 07/20/2015   Raynauds phenomenon    Possible   Type 2 diabetes mellitus ([place])     Past Surgical  History   Past Surgical History:  Procedure Laterality Date   ABDOMINAL HYSTERECTOMY     BREAST EXCISIONAL BIOPSY Left    BREAST SURGERY     CATARACT EXTRACTION Right 02/03/2020   CATARACT EXTRACTION Left 02/09/2020   COLONOSCOPY  06/11/2003   Smith:multiple medium scatered diverticula in the cecum, a sending colon, transverse colon, descending colon, sigmoid colon.   COLONOSCOPY N/A 05/20/2013   TICS, SML IH   Cystectomy L breast, benign     VESICOVAGINAL FISTULA CLOSURE W/ TAH  1989    Past Family History   Family History  Problem Relation Age of Onset   Hypertension Mother    Urticaria Mother    Heart attack Mother    Heart failure Father    Lung cancer Father    Heart disease Father    Asthma Sister    Heart disease Sister    Hypertension Brother    Crohn's disease Brother    Diabetes Brother    Hypertension Sister    Hypertension Sister    Colon cancer Maternal Grandfather        Age greater than 70   Breast cancer Daughter    Allergic rhinitis Neg Hx    Angioedema Neg Hx    Atopy Neg Hx    Eczema Neg Hx    Immunodeficiency Neg Hx     Past Social History   Social History   Socioeconomic History   Marital status: Married    Spouse name: Alvino   Number of children: 2   Years of education: Not on file   Highest education level: Not on file  Occupational History   Occupation: Pharmacist, hospital   Tobacco Use   Smoking status: Former    Packs/day: 0.50    Years: 16.00    Total pack years: 8.00    Types: Cigarettes    Start date: 73    Quit date: 09/04/1986    Years since quitting: 35.7   Smokeless tobacco: Never  Vaping Use   Vaping Use: Never used  Substance and Sexual Activity   Alcohol use: Yes    Alcohol/week: 0.0 standard drinks of alcohol    Comment: occ   Drug use: No   Sexual activity: Yes    Birth control/protection: Surgical    Comment: hyst  Other Topics Concern   Not on file  Social History Narrative   1 son, 1 daughter   9  grandchildren.   Social Determinants of Health   Financial Resource Strain: Low Risk  (06/29/2021)   Overall Financial Resource Strain (CARDIA)    Difficulty of Paying Living Expenses: Not hard at  all  Food Insecurity: No Food Insecurity (06/29/2021)   Hunger Vital Sign    Worried About Running Out of Food in the Last Year: Never true    Ran Out of Food in the Last Year: Never true  Transportation Needs: No Transportation Needs (06/29/2021)   PRAPARE - Hydrologist (Medical): No    Lack of Transportation (Non-Medical): No  Physical Activity: Sufficiently Active (06/29/2021)   Exercise Vital Sign    Days of Exercise per Week: 5 days    Minutes of Exercise per Session: 30 min  Stress: No Stress Concern Present (06/29/2021)   [place]    Feeling of Stress : Only a little  Social Connections: Socially Integrated (06/29/2021)   Social Connection and Isolation Panel [NHANES]    Frequency of Communication with Friends and Family: More than three times a week    Frequency of Social Gatherings with Friends and Family: More than three times a week    Attends Religious Services: More than 4 times per year    Active Member of [employer] or Organizations: Yes    Attends Music therapist: More than 4 times per year    Marital Status: Married  Human resources officer Violence: Not At Risk (06/29/2021)   Humiliation, Afraid, Rape, and Kick questionnaire    Fear of Current or Ex-Partner: No    Emotionally Abused: No    Physically Abused: No    Sexually Abused: No    Review of Systems   General: Negative for anorexia, weight loss, fever, chills, fatigue, weakness. ENT: Negative for hoarseness, difficulty swallowing , nasal congestion. CV: Negative for chest pain, angina, palpitations, dyspnea on exertion, peripheral edema.  Respiratory: Negative for dyspnea at rest, dyspnea on exertion, cough,  sputum, wheezing.  GI: See history of present illness. GU:  Negative for dysuria, hematuria, urinary incontinence, urinary frequency, nocturnal urination.  Endo: Negative for unusual weight change.     Physical Exam   There were no vitals taken for this visit.   General: Well-nourished, well-developed in no acute distress.  Eyes: No icterus. Mouth: Oropharyngeal mucosa moist and pink , no lesions erythema or exudate. Lungs: Clear to auscultation bilaterally.  Heart: Regular rate and rhythm, no murmurs rubs or gallops.  Abdomen: Bowel sounds are normal, nontender, nondistended, no hepatosplenomegaly or masses,  no abdominal bruits or hernia , no rebound or guarding.  Rectal: ***  Extremities: No lower extremity edema. No clubbing or deformities. Neuro: Alert and oriented x 4   Skin: Warm and dry, no jaundice.   Psych: Alert and cooperative, normal mood and affect.  Labs   *** Imaging Studies   No results found.  Assessment       PLAN   ***   Laureen Ochs. Bobby Rumpf, [place], [facility]

## 2022-06-12 ENCOUNTER — Ambulatory Visit (INDEPENDENT_AMBULATORY_CARE_PROVIDER_SITE_OTHER): Payer: Medicare PPO | Admitting: Gastroenterology

## 2022-06-12 ENCOUNTER — Encounter: Payer: Self-pay | Admitting: Gastroenterology

## 2022-06-12 VITALS — BP 148/92 | HR 101 | Temp 98.5°F | Ht 64.0 in | Wt 210.0 lb

## 2022-06-12 DIAGNOSIS — K219 Gastro-esophageal reflux disease without esophagitis: Secondary | ICD-10-CM

## 2022-06-12 NOTE — Patient Instructions (Signed)
Try cutting back on pantoprazole to once daily before breakfast. If you notice increased issues with cough, hoarseness, heartburn, or swallowing issues then go back to twice daily. Please call if you notice more frequent episodes of pills or food sticking in your esophagus. If this happens, would recommend upper endoscopy. Otherwise, we will see you back in 05/2023 for follow up. You will be due for colonoscopy at that time. Would consider upper endoscopy as well.

## 2022-06-16 ENCOUNTER — Other Ambulatory Visit: Payer: Self-pay | Admitting: Family Medicine

## 2022-06-30 ENCOUNTER — Other Ambulatory Visit: Payer: Self-pay | Admitting: Family Medicine

## 2022-07-11 ENCOUNTER — Ambulatory Visit (INDEPENDENT_AMBULATORY_CARE_PROVIDER_SITE_OTHER): Payer: Medicare PPO | Admitting: Internal Medicine

## 2022-07-11 ENCOUNTER — Encounter: Payer: Self-pay | Admitting: Internal Medicine

## 2022-07-11 DIAGNOSIS — Z0000 Encounter for general adult medical examination without abnormal findings: Secondary | ICD-10-CM | POA: Diagnosis not present

## 2022-07-11 NOTE — Progress Notes (Signed)
Subjective:  This is a telephone encounter between Willeen Niece and Lorene Dy on 07/11/2022 for AWV. The visit was conducted with the patient located at home and Lorene Dy at [facility]. The patient's identity was confirmed using their DOB and current address. The patient has consented to being evaluated through a telephone encounter and understands the associated risks (an examination cannot be done and the patient may need to come in for an appointment) / benefits (allows the patient to remain at home, decreasing exposure to coronavirus).     Yumna Ebers is a 71 y.o. female who presents for Medicare Annual (Subsequent) preventive examination.  Review of Systems    Review of Systems  All other systems reviewed and are negative.     Objective:    There were no vitals filed for this visit. There is no height or weight on file to calculate BMI.     07/11/2022   11:34 AM 06/29/2021   11:19 AM 06/22/2020   10:46 AM 09/03/2015    3:33 PM 05/20/2013    7:44 AM  Advanced Directives  Does Patient Have a Medical Advance Directive? No No No No Patient does not have advance directive;Patient would not like information  Would patient like information on creating a medical advance directive? Yes (MAU/Ambulatory/Procedural Areas - Information given) No - Patient declined No - Patient declined No - patient declined information   Pre-existing out of facility DNR order (yellow form or pink MOST form)     No    Current Medications (verified) Outpatient Encounter Medications as of 07/11/2022  Medication Sig   albuterol (VENTOLIN HFA) 108 (90 Base) MCG/ACT inhaler Inhale 2 puffs into the lungs every 6 (six) hours as needed for wheezing or shortness of breath.   amLODipine (NORVASC) 5 MG tablet Take 1 tablet (5 mg total) by mouth daily.   aspirin 81 MG EC tablet Take 81 mg by mouth daily.   azelastine (ASTELIN) 0.1 % nasal spray Place 2 sprays into both nostrils 2 (two) times daily.  Use in each nostril as directed (Patient taking differently: Place 2 sprays into both nostrils as needed. Use in each nostril as directed)   benzonatate (TESSALON) 100 MG capsule Take 1 capsule (100 mg total) by mouth 3 (three) times daily as needed for cough. Do not take with alcohol or while driving or operating heavy machinery.  May cause drowsiness.   Calcium Carbonate-Vitamin D 600-200 MG-UNIT TABS Take 1 tablet by mouth daily.   carvedilol (COREG) 6.25 MG tablet TAKE 1.5 TABLET BY MOUTH TWICE DAILY WITH A MEAL   clobetasol ointment (TEMOVATE) 0.05 % Apply bid to affected area for 2 weeks then 3 x a week   cloNIDine (CATAPRES) 0.2 MG tablet Take 1 tablet (0.2 mg total) by mouth daily.   FLUoxetine (PROZAC) 40 MG capsule TAKE 1 CAPSULE(40 MG) BY MOUTH DAILY   fluticasone (FLONASE) 50 MCG/ACT nasal spray Place 2 sprays into both nostrils daily.   montelukast (SINGULAIR) 10 MG tablet TAKE 1 TABLET(10 MG) BY MOUTH AT BEDTIME   pantoprazole (PROTONIX) 40 MG tablet TAKE 1 TABLET(40 MG) BY MOUTH TWICE DAILY BEFORE A MEAL   spironolactone (ALDACTONE) 25 MG tablet TAKE 1 TABLET(25 MG) BY MOUTH DAILY   Tiotropium Bromide Monohydrate (SPIRIVA RESPIMAT) 1.25 MCG/ACT AERS Inhale 2 puffs into the lungs daily.   traZODone (DESYREL) 100 MG tablet Take 1 tablet (100 mg total) by mouth at bedtime.   venlafaxine XR (EFFEXOR-XR) 75 MG 24 hr capsule  TAKE 1 CAPSULE(75 MG) BY MOUTH DAILY WITH BREAKFAST   Vitamin D, Ergocalciferol, (DRISDOL) 1.25 MG (50000 UNIT) CAPS capsule TAKE 1 CAPSULE BY MOUTH 1 TIME A WEEK   No facility-administered encounter medications on file as of 07/11/2022.    Allergies (verified) Olmesartan and Lactose intolerance (gi)   History: Past Medical History:  Diagnosis Date   Allergic sinusitis 12/18/2012   Allergic urticaria 04/02/2017   Anxiety    COVID-19 virus infection 09/05/2019   Depression    Diverticulosis 12/11/2013   Colonoscopy in 2014 per Dr Oneida Alar, also has small  hemmorhoids    Dizziness    Mild Orthostatic   Essential hypertension    Fatigue    History of chest pain    Negative stress echo 2010   Hyperlipidemia    Insomnia    Lab test positive for detection of COVID-19 virus 09/24/2019   Mitral valve prolapse    Non-allergic rhinitis 10/31/2017   Obesity    Palpitations    a. PAC's, PVC's and brief NSVT by prior monitor in 09/2020   Paresthesias    Upper extremity /episode sensation of coldness and numbness    Piles (hemorrhoids) 07/20/2015   Raynauds phenomenon    Possible   Type 2 diabetes mellitus ([place])    Past Surgical History:  Procedure Laterality Date   ABDOMINAL HYSTERECTOMY     BREAST EXCISIONAL BIOPSY Left    BREAST SURGERY     CATARACT EXTRACTION Right 02/03/2020   CATARACT EXTRACTION Left 02/09/2020   COLONOSCOPY  06/11/2003   Smith:multiple medium scatered diverticula in the cecum, a sending colon, transverse colon, descending colon, sigmoid colon.   COLONOSCOPY N/A 05/20/2013   TICS, SML IH   Cystectomy L breast, benign     VESICOVAGINAL FISTULA CLOSURE W/ TAH  1989   Family History  Problem Relation Age of Onset   Hypertension Mother    Urticaria Mother    Heart attack Mother    Heart failure Father    Lung cancer Father    Heart disease Father    Asthma Sister    Heart disease Sister    Hypertension Brother    Crohn's disease Brother    Diabetes Brother    Hypertension Sister    Hypertension Sister    Colon cancer Maternal Grandfather        Age greater than 47   Breast cancer Daughter    Allergic rhinitis Neg Hx    Angioedema Neg Hx    Atopy Neg Hx    Eczema Neg Hx    Immunodeficiency Neg Hx    Social History   Socioeconomic History   Marital status: Married    Spouse name: Alvino   Number of children: 2   Years of education: Not on file   Highest education level: Not on file  Occupational History   Occupation: Pharmacist, hospital   Tobacco Use   Smoking status: Former    Packs/day: 0.50    Years:  16.00    Total pack years: 8.00    Types: Cigarettes    Start date: 72    Quit date: 09/04/1986    Years since quitting: 35.8   Smokeless tobacco: Never  Vaping Use   Vaping Use: Never used  Substance and Sexual Activity   Alcohol use: Yes    Alcohol/week: 0.0 standard drinks of alcohol    Comment: occ   Drug use: No   Sexual activity: Yes    Birth control/protection: Surgical  Comment: hyst  Other Topics Concern   Not on file  Social History Narrative   1 son, 1 daughter   17 grandchildren.   Social Determinants of Health   Financial Resource Strain: Low Risk  (06/29/2021)   Overall Financial Resource Strain (CARDIA)    Difficulty of Paying Living Expenses: Not hard at all  Food Insecurity: No Food Insecurity (06/29/2021)   Hunger Vital Sign    Worried About Running Out of Food in the Last Year: Never true    Ran Out of Food in the Last Year: Never true  Transportation Needs: No Transportation Needs (06/29/2021)   PRAPARE - Hydrologist (Medical): No    Lack of Transportation (Non-Medical): No  Physical Activity: Sufficiently Active (06/29/2021)   Exercise Vital Sign    Days of Exercise per Week: 5 days    Minutes of Exercise per Session: 30 min  Stress: No Stress Concern Present (06/29/2021)   [place]    Feeling of Stress : Only a little  Social Connections: Socially Integrated (06/29/2021)   Social Connection and Isolation Panel [NHANES]    Frequency of Communication with Friends and Family: More than three times a week    Frequency of Social Gatherings with Friends and Family: More than three times a week    Attends Religious Services: More than 4 times per year    Active Member of [employer] or Organizations: Yes    Attends Music therapist: More than 4 times per year    Marital Status: Married    Tobacco Counseling Counseling given: Not  Answered   Clinical Intake:  Pre-visit preparation completed: Yes  Pain : No/denies pain     Diabetes: No  How often do you need to have someone help you when you read instructions, pamphlets, or other written materials from your doctor or pharmacy?: 1 - Never What is the last grade level you completed in school?: Masters degree   Activities of Daily Living    07/11/2022   11:36 AM  In your present state of health, do you have any difficulty performing the following activities:  Hearing? 0  Vision? 0  Difficulty concentrating or making decisions? 0  Walking or climbing stairs? 0  Dressing or bathing? 0  Doing errands, shopping? 0    Patient Care Team: Fayrene Helper, MD as PCP - General Domenic Polite Aloha Gell, MD as PCP - Cardiology (Cardiology) Danie Binder, MD (Inactive) as Consulting Physician (Gastroenterology)  Indicate any recent Medical Services you may have received from other than [facility] providers in the past year (date may be approximate).     Assessment:   This is a routine wellness examination for Aaliayah.  Hearing/Vision screen No results found.  Dietary issues and exercise activities discussed:     Goals Addressed   None    Depression Screen    07/11/2022   11:35 AM 05/16/2022   10:55 AM 01/10/2022   10:43 AM 08/25/2021    9:48 AM 06/29/2021   11:14 AM 12/13/2020   10:57 AM 09/23/2020   12:02 PM  PHQ 2/9 Scores  PHQ - 2 Score 1 0 0 2 1 0 1  PHQ- 9 Score    5   5    Fall Risk    05/16/2022   10:55 AM 01/10/2022   10:43 AM 12/07/2021    3:09 PM 08/25/2021    9:47 AM 06/29/2021  11:19 AM  Fall Risk   Falls in the past year? 0 0 0 0 0  Number falls in past yr: 0 0  0 0  Injury with Fall? 0 0  0 0  Risk for fall due to : No Fall Risks No Fall Risks   Impaired vision  Follow up Falls evaluation completed Falls evaluation completed   Falls prevention discussed    FALL RISK PREVENTION PERTAINING TO THE HOME:  Any stairs in or around the  home? Yes  If so, are there any without handrails? Yes  Home free of loose throw rugs in walkways, pet beds, electrical cords, etc? No  Adequate lighting in your home to reduce risk of falls? Yes   ASSISTIVE DEVICES UTILIZED TO PREVENT FALLS:  Life alert? No  Use of a cane, walker or w/c? No  Grab bars in the bathroom? Yes  Shower chair or bench in shower? No  Elevated toilet seat or a handicapped toilet? Yes     Cognitive Function:        07/11/2022   11:36 AM 06/29/2021   11:22 AM 06/22/2020   10:48 AM 06/19/2019   10:32 AM  6CIT Screen  What Year? 0 points 0 points 0 points 0 points  What month? 0 points 0 points 0 points 0 points  What time? 0 points 0 points 0 points 0 points  Count back from 20 0 points 0 points 0 points 0 points  Months in reverse 0 points 0 points 0 points 0 points  Repeat phrase 0 points 0 points 0 points 0 points  Total Score 0 points 0 points 0 points 0 points    Immunizations Immunization History  Administered Date(s) Administered   Fluad Quad(high Dose 65+) 04/28/2019, 06/15/2020, 05/13/2021, 05/16/2022   Influenza Split 05/23/2011, 05/16/2012   Influenza Whole 06/21/2008, 06/08/2009, 05/13/2010   Influenza, High Dose Seasonal PF 06/17/2018   Influenza,inj,Quad PF,6+ Mos 06/04/2013, 07/21/2014, 07/20/2015, 05/23/2016, 08/06/2017   PFIZER(Purple Top)SARS-COV-2 Vaccination 12/04/2019, 12/26/2019, 08/13/2020   Pneumococcal Conjugate-13 04/20/2014   Pneumococcal Polysaccharide-23 06/17/2018   Td 03/26/2007   Tdap 08/06/2017   Zoster, Live 08/05/2013    TDAP status: Up to date  Flu Vaccine status: Up to date  Pneumococcal vaccine status: Up to date  Covid-19 vaccine status: Information provided on how to obtain vaccines.   Qualifies for Shingles Vaccine? Yes   Zostavax completed No   Shingrix Completed?: Yes  Screening Tests Health Maintenance  Topic Date Due   Zoster Vaccines- Shingrix (1 of 2) Never done   COVID-19 Vaccine (4  - Pfizer risk series) 10/08/2020   Medicare Annual Wellness (AWV)  06/29/2022   COLONOSCOPY (Pts 45-42yr Insurance coverage will need to be confirmed)  05/21/2023   MAMMOGRAM  08/16/2023   TETANUS/TDAP  08/07/2027   Pneumonia Vaccine 71 Years old  Completed   INFLUENZA VACCINE  Completed   DEXA SCAN  Completed   Hepatitis C Screening  Completed   HPV VACCINES  Aged Out    Health Maintenance  Health Maintenance Due  Topic Date Due   Zoster Vaccines- Shingrix (1 of 2) Never done   COVID-19 Vaccine (4 - Pfizer risk series) 10/08/2020   Medicare Annual Wellness (AWV)  06/29/2022    Colorectal cancer screening: Type of screening: Colonoscopy. Completed 05/20/2013. Repeat every 10 years  Mammogram status: Ordered 08/18/2022. Pt provided with contact info and advised to call to schedule appt.   Bone Density status: Completed 08/15/2021. Results reflect: Bone density results:  NORMAL.   Lung Cancer Screening: (Low Dose CT Chest recommended if Age 63-80 years, 30 pack-year currently smoking OR have quit w/in 15years.) does not qualify.   Additional Screening:  Hepatitis C Screening: does not qualify; Completed 11/15/2015  Vision Screening: Recommended annual ophthalmology exams for early detection of glaucoma and other disorders of the eye. Is the patient up to date with their annual eye exam?  Yes  Who is the provider or what is the name of the office in which the patient attends annual eye exams? [facility] [place] If pt is not established with a provider, would they like to be referred to a provider to establish care? No .   Dental Screening: Recommended annual dental exams for proper oral hygiene  Community Resource Referral / Chronic Care Management: CRR required this visit?  No   CCM required this visit?  No      Plan:     I have personally reviewed and noted the following in the patient's chart:   Medical and social history Use of alcohol, tobacco or illicit  drugs  Current medications and supplements including opioid prescriptions. Patient is not currently taking opioid prescriptions. Functional ability and status Nutritional status Physical activity Advanced directives List of other physicians Hospitalizations, surgeries, and ER visits in previous 12 months Vitals Screenings to include cognitive, depression, and falls Referrals and appointments  In addition, I have reviewed and discussed with patient certain preventive protocols, quality metrics, and best practice recommendations. A written personalized care plan for preventive services as well as general preventive health recommendations were provided to patient.     Lorene Dy, MD   07/11/2022

## 2022-07-11 NOTE — Patient Instructions (Signed)
  Alexis Hurst , Thank you for taking time to come for your Medicare Wellness Visit. I appreciate your ongoing commitment to your health goals. Please review the following plan we discussed and let me know if I can assist you in the future.   These are the goals we discussed: Patient is going to increase activity to target weight loss. She is going to go to pharmacy for shingles vaccine and Covid booster.  I am following up with Dr.Simpson to discuss repeat imaging for lung nodules seen in 2020. Also will follow up dermatology referral placed, but patient has not been scheduled.   This is a list of the screening recommended for you and due dates:  Health Maintenance  Topic Date Due   Zoster (Shingles) Vaccine (1 of 2) Never done   COVID-19 Vaccine (4 - Pfizer risk series) 10/08/2020   Colon Cancer Screening  05/21/2023   Medicare Annual Wellness Visit  07/12/2023   Mammogram  08/16/2023   Tetanus Vaccine  08/07/2027   Pneumonia Vaccine  Completed   Flu Shot  Completed   DEXA scan (bone density measurement)  Completed   Hepatitis C Screening: USPSTF Recommendation to screen - Ages 18-79 yo.  Completed   HPV Vaccine  Aged Out

## 2022-07-16 ENCOUNTER — Other Ambulatory Visit: Payer: Self-pay | Admitting: Family Medicine

## 2022-07-31 ENCOUNTER — Other Ambulatory Visit: Payer: Self-pay | Admitting: Family Medicine

## 2022-08-07 ENCOUNTER — Other Ambulatory Visit: Payer: Self-pay | Admitting: Family Medicine

## 2022-08-16 ENCOUNTER — Ambulatory Visit: Payer: Medicare PPO | Admitting: Family Medicine

## 2022-08-17 ENCOUNTER — Encounter: Payer: Self-pay | Admitting: Family Medicine

## 2022-08-17 ENCOUNTER — Ambulatory Visit (INDEPENDENT_AMBULATORY_CARE_PROVIDER_SITE_OTHER): Payer: Medicare PPO | Admitting: Family Medicine

## 2022-08-17 VITALS — BP 118/82 | HR 98 | Ht 64.0 in | Wt 213.0 lb

## 2022-08-17 DIAGNOSIS — R1313 Dysphagia, pharyngeal phase: Secondary | ICD-10-CM

## 2022-08-17 DIAGNOSIS — R7303 Prediabetes: Secondary | ICD-10-CM

## 2022-08-17 DIAGNOSIS — J309 Allergic rhinitis, unspecified: Secondary | ICD-10-CM

## 2022-08-17 DIAGNOSIS — I10 Essential (primary) hypertension: Secondary | ICD-10-CM | POA: Diagnosis not present

## 2022-08-17 DIAGNOSIS — F418 Other specified anxiety disorders: Secondary | ICD-10-CM | POA: Diagnosis not present

## 2022-08-17 DIAGNOSIS — E785 Hyperlipidemia, unspecified: Secondary | ICD-10-CM | POA: Diagnosis not present

## 2022-08-17 DIAGNOSIS — K219 Gastro-esophageal reflux disease without esophagitis: Secondary | ICD-10-CM

## 2022-08-17 MED ORDER — VITAMIN D (ERGOCALCIFEROL) 1.25 MG (50000 UNIT) PO CAPS: ORAL_CAPSULE | ORAL | 1 refills | Status: DC

## 2022-08-17 MED ORDER — WEGOVY 0.25 MG/0.5ML ~~LOC~~ SOAJ: 0.2500 mg | SUBCUTANEOUS | 0 refills | Status: DC

## 2022-08-17 MED ORDER — PANTOPRAZOLE SODIUM 40 MG PO TBEC: DELAYED_RELEASE_TABLET | ORAL | 1 refills | Status: DC

## 2022-08-17 MED ORDER — MONTELUKAST SODIUM 10 MG PO TABS: ORAL_TABLET | ORAL | 1 refills | Status: DC

## 2022-08-17 NOTE — Patient Instructions (Addendum)
F/U in 3 months, call if you need me sooner   Fasting HBA1c, cmp and EGFR today  Need shingrix and SV vaccines   You are referred to GI re difficulty swallowing  Will be in touch re weight loss medicine  Please commit to daily flonase and montelukast to see if  that will control allergy, ion 5 weeks if no improvement send me a message you will need to add loratadine twice daily  It is important that you exercise regularly at least 30 minutes 5 times a week. If you develop chest pain, have severe difficulty breathing, or feel very tired, stop exercising immediately and seek medical attention   Think about what you will eat, plan ahead. Choose " clean, green, fresh or frozen" over canned, processed or packaged foods which are more sugary, salty and fatty. 70 to 75% of food eaten should be vegetables and fruit. Three meals at set times with snacks allowed between meals, but they must be fruit or vegetables. Aim to eat over a 12 hour period , example 7 am to 7 pm, and STOP after  your last meal of the day. Drink water,generally about 64 ounces per day, no other drink is as healthy. Fruit juice is best enjoyed in a healthy way, by EATING the fruit.   Thanks for choosing [facility], we consider it a privelige to serve you.

## 2022-08-18 ENCOUNTER — Ambulatory Visit (HOSPITAL_COMMUNITY)
Admission: RE | Admit: 2022-08-18 | Discharge: 2022-08-18 | Disposition: A | Discharge status: Home / Self Care | Payer: Medicare PPO | Source: Ambulatory Visit | Attending: Family Medicine | Admitting: Family Medicine

## 2022-08-18 ENCOUNTER — Other Ambulatory Visit: Payer: Self-pay | Admitting: Family Medicine

## 2022-08-18 DIAGNOSIS — M79601 Pain in right arm: Secondary | ICD-10-CM

## 2022-08-18 DIAGNOSIS — Z1231 Encounter for screening mammogram for malignant neoplasm of breast: Secondary | ICD-10-CM

## 2022-08-18 DIAGNOSIS — R600 Localized edema: Secondary | ICD-10-CM

## 2022-08-18 DIAGNOSIS — M7989 Other specified soft tissue disorders: Secondary | ICD-10-CM | POA: Diagnosis not present

## 2022-08-18 LAB — CMP14+EGFR
ALT: 23 IU/L (ref 0–32)
AST: 18 IU/L (ref 0–40)
Albumin/Globulin Ratio: 1.3 (ref 1.2–2.2)
Albumin: 4 g/dL (ref 3.9–4.9)
Alkaline Phosphatase: 101 IU/L (ref 44–121)
BUN/Creatinine Ratio: 12 (ref 12–28)
BUN: 11 mg/dL (ref 8–27)
Bilirubin Total: 0.2 mg/dL (ref 0.0–1.2)
CO2: 23 mmol/L (ref 20–29)
Calcium: 9 mg/dL (ref 8.7–10.3)
Chloride: 104 mmol/L (ref 96–106)
Creatinine, Ser: 0.95 mg/dL (ref 0.57–1.00)
Globulin, Total: 3.1 g/dL (ref 1.5–4.5)
Glucose: 107 mg/dL — ABNORMAL HIGH (ref 70–99)
Potassium: 4.2 mmol/L (ref 3.5–5.2)
Sodium: 141 mmol/L (ref 134–144)
Total Protein: 7.1 g/dL (ref 6.0–8.5)
eGFR: 65 mL/min/{1.73_m2} (ref >59)

## 2022-08-18 LAB — HEMOGLOBIN A1C
Est. average glucose Bld gHb Est-mCnc: 134 mg/dL
Hgb A1c MFr Bld: 6.3 % — ABNORMAL HIGH (ref 4.8–5.6)

## 2022-08-20 ENCOUNTER — Encounter: Payer: Self-pay | Admitting: Family Medicine

## 2022-08-20 ENCOUNTER — Telehealth: Payer: Self-pay | Admitting: Family Medicine

## 2022-08-20 NOTE — Progress Notes (Addendum)
Alexis Hurst     MRN: [id number]      DOB: 10-Mar-1951   HPI [name] is here for follow up and re-evaluation of chronic medical conditions, medication management and review of any available recent lab and radiology data.  Preventive health is updated, specifically  Cancer screening and Immunization.   Questions or concerns regarding consultations or procedures which the PT has had in the interim are  addressed. The PT denies any adverse reactions to current medications since the last visit.  There are no new concerns.  There are no specific complaints  C/o acute RUE pain and swelling the day following this visit , where she had venipuncture  ROS Denies recent fever or chills. Denies sinus pressure, nasal congestion, ear pain or sore throat. Denies chest congestion, productive cough or wheezing. Denies chest pains, palpitations and leg swelling Denies abdominal pain, nausea, vomiting,diarrhea or constipation.   Denies dysuria, frequency, hesitancy or incontinence. Denies joint pain, swelling and limitation in mobility. Denies headaches, seizures, numbness, or tingling. Denies depression, anxiety or insomnia. Denies skin break down or rash.   PE  BP 118/82   Pulse 98   Ht '5\' 4"'$  (1.626 m)   Wt 213 lb (96.6 kg)   SpO2 96%   BMI 36.56 kg/m   Patient alert and oriented and in no cardiopulmonary distress.  HEENT: No facial asymmetry, EOMI,     Neck supple .  Chest: Clear to auscultation bilaterally.  CVS: S1, S2 no murmurs, no S3.Regular rate.  ABD: Soft non tender.   Ext: No edema  MS: Adequate ROM spine, shoulders, hips and knees.  Skin: Intact, no ulcerations or rash noted.  Psych: Good eye contact, normal affect. Memory intact not anxious or depressed appearing.  CNS: CN 2-12 intact, power,  normal throughout.no focal deficits noted. Ext: RUE swelling, erythema and tenderness on 08/18/2022  Assessment & Plan  GERD (gastroesophageal reflux  disease) Uncontrolled with solid dysphagea, refer GI  Allergic sinusitis Uncontrolled , needs to commit to daily meds as prescribed, if persists tart twice daily Claritin, if still a problem pt to contact office , she will be referred to [place] hypertension DASH diet and commitment to daily physical activity for a minimum of 30 minutes discussed and encouraged, as a part of hypertension management. The importance of attaining a healthy weight is also discussed.     08/17/2022   10:03 AM 08/17/2022    9:22 AM 06/12/2022    9:11 AM 06/05/2022    2:08 PM 06/05/2022    2:07 PM 05/16/2022   10:54 AM 04/27/2022    9:21 AM  BP/Weight  Systolic BP 557 322 025 427  062 376  Diastolic BP 82 86 92 87  80 68  Wt. (Lbs)  213 210  210 208 211.4  BMI  36.56 kg/m2 36.05 kg/m2  36.05 kg/m2 35.7 kg/m2 36.29 kg/m2     Controlled, no change in medication   Hyperlipidemia LDL goal <100 Hyperlipidemia:Low fat diet discussed and encouraged. Updated lab needed at/ before next visit.    Lipid Panel  Lab Results  Component Value Date   CHOL 203 (H) 01/09/2022   HDL 58 01/09/2022   LDLCALC 129 (H) 01/09/2022   TRIG 90 01/09/2022   CHOLHDL 3.5 01/09/2022       Morbid obesity ([place])  Patient re-educated about  the importance of commitment to a  minimum of 150 minutes of exercise per week as able.  The importance of  healthy food choices with portion control discussed, as well as eating regularly and within a 12 hour window most days. The need to choose "clean , green" food 50 to 75% of the time is discussed, as well as to make water the primary drink and set a goal of 64 ounces water daily.       08/17/2022    9:22 AM 06/12/2022    9:11 AM 06/05/2022    2:07 PM  Weight /BMI  Weight 213 lb 210 lb 210 lb  Height '5\' 4"'$  (1.626 m) '5\' 4"'$  (1.626 m) '5\' 4"'$  (1.626 m)  BMI 36.56 kg/m2 36.05 kg/m2 36.05 kg/m2      Depression with anxiety Controlled, no change in  medication  Swelling RUE: [place] stat to eval for DVT on 08/18/2022 Negative Advised cool compress, tylenol

## 2022-08-20 NOTE — Assessment & Plan Note (Signed)
Uncontrolled with solid dysphagea, refer GI

## 2022-08-20 NOTE — Assessment & Plan Note (Signed)
Uncontrolled , needs to commit to daily meds as prescribed, if persists tart twice daily Claritin, if still a problem pt to contact office , she will be referred to Allergist

## 2022-08-20 NOTE — Assessment & Plan Note (Signed)
Controlled, no change in medication  

## 2022-08-20 NOTE — Assessment & Plan Note (Signed)
DASH diet and commitment to daily physical activity for a minimum of 30 minutes discussed and encouraged, as a part of hypertension management. The importance of attaining a healthy weight is also discussed.     08/17/2022   10:03 AM 08/17/2022    9:22 AM 06/12/2022    9:11 AM 06/05/2022    2:08 PM 06/05/2022    2:07 PM 05/16/2022   10:54 AM 04/27/2022    9:21 AM  BP/Weight  Systolic BP 563 875 643 329  518 841  Diastolic BP 82 86 92 87  80 68  Wt. (Lbs)  213 210  210 208 211.4  BMI  36.56 kg/m2 36.05 kg/m2  36.05 kg/m2 35.7 kg/m2 36.29 kg/m2     Controlled, no change in medication

## 2022-08-20 NOTE — Assessment & Plan Note (Signed)
  Patient re-educated about  the importance of commitment to a  minimum of 150 minutes of exercise per week as able.  The importance of healthy food choices with portion control discussed, as well as eating regularly and within a 12 hour window most days. The need to choose "clean , green" food 50 to 75% of the time is discussed, as well as to make water the primary drink and set a goal of 64 ounces water daily.       08/17/2022    9:22 AM 06/12/2022    9:11 AM 06/05/2022    2:07 PM  Weight /BMI  Weight 213 lb 210 lb 210 lb  Height '5\' 4"'$  (1.626 m) '5\' 4"'$  (1.626 m) '5\' 4"'$  (1.626 m)  BMI 36.56 kg/m2 36.05 kg/m2 36.05 kg/m2

## 2022-08-20 NOTE — Assessment & Plan Note (Addendum)
Hyperlipidemia:Low fat diet discussed and encouraged. Updated lab needed at/ before next visit.    Lipid Panel  Lab Results  Component Value Date   CHOL 203 (H) 01/09/2022   HDL 58 01/09/2022   LDLCALC 129 (H) 01/09/2022   TRIG 90 01/09/2022   CHOLHDL 3.5 01/09/2022

## 2022-08-20 NOTE — Telephone Encounter (Signed)
Pls f/u regarding availability/ coverage for wegovy for this pt and what alternate if any  is available, ozempic or trulicity, sh is pre diabetic, we said that we would f/u on t med and get back to her

## 2022-08-21 DIAGNOSIS — L821 Other seborrheic keratosis: Secondary | ICD-10-CM | POA: Diagnosis not present

## 2022-08-21 DIAGNOSIS — L853 Xerosis cutis: Secondary | ICD-10-CM | POA: Diagnosis not present

## 2022-08-21 DIAGNOSIS — L818 Other specified disorders of pigmentation: Secondary | ICD-10-CM | POA: Diagnosis not present

## 2022-08-22 NOTE — Telephone Encounter (Signed)
Pt has medicare- they do NOT cover any weight loss meds. To get ozempic/trulicity pt must have diabetes. Prediabetes will not cover

## 2022-08-23 NOTE — Telephone Encounter (Signed)
Patient advised.

## 2022-08-24 NOTE — Progress Notes (Signed)
Cardiology Office Note  Date: 08/25/2022   ID: Alexis Hurst, Alexis Hurst 02/06/1951, MRN [id number]  PCP:  Alexis Helper, MD  Cardiologist:  Alexis Lesches, MD Electrophysiologist:  None   Chief Complaint  Patient presents with   Cardiac follow-up    History of Present Illness: Alexis Hurst is a 71 y.o. female last seen in June by Ms. Strader PA-C, I reviewed the note.  She is here for a follow-up visit.  No increase in sense of palpitations, no reported exertional chest pain or syncope.  We went over her medications.  She reports compliance with therapy.  Blood pressure control has been reasonable as well.  I did review her interval lab work.  Last hemoglobin A1c was 6.3%.  LDL was also up to 129 off therapy.  She had been on low-dose Lipitor in the past with LDL 71 in 2021.  Does not recall any intolerances. . I personally reviewed her ECG today which shows normal sinus rhythm.  Past Medical History:  Diagnosis Date   Allergic sinusitis 12/18/2012   Allergic urticaria 04/02/2017   Anxiety    COVID-19 virus infection 09/05/2019   Depression    Diverticulosis 12/11/2013   Colonoscopy in 2014 per Alexis Hurst, also has small hemmorhoids    Dizziness    Mild Orthostatic   Essential hypertension    Fatigue    History of chest pain    Negative stress echo 2010   Hyperlipidemia    Insomnia    Lab test positive for detection of COVID-19 virus 09/24/2019   Mitral valve prolapse    Non-allergic rhinitis 10/31/2017   Obesity    Palpitations    a. PAC's, PVC's and brief NSVT by prior monitor in 09/2020   Paresthesias    Upper extremity /episode sensation of coldness and numbness    Piles (hemorrhoids) 07/20/2015   Raynauds phenomenon    Possible   Type 2 diabetes mellitus ([place])     Past Surgical History:  Procedure Laterality Date   ABDOMINAL HYSTERECTOMY     BREAST EXCISIONAL BIOPSY Left    BREAST SURGERY     CATARACT EXTRACTION Right 02/03/2020    CATARACT EXTRACTION Left 02/09/2020   COLONOSCOPY  06/11/2003   Alexis Hurst:multiple medium scatered diverticula in the cecum, a sending colon, transverse colon, descending colon, sigmoid colon.   COLONOSCOPY N/A 05/20/2013   TICS, SML IH   Cystectomy L breast, benign     VESICOVAGINAL FISTULA CLOSURE W/ TAH  1989    Current Outpatient Medications  Medication Sig Dispense Refill   albuterol (VENTOLIN HFA) 108 (90 Base) MCG/ACT inhaler Inhale 2 puffs into the lungs every 6 (six) hours as needed for wheezing or shortness of breath. 8 g 6   amLODipine (NORVASC) 5 MG tablet Take 1 tablet (5 mg total) by mouth daily. 90 tablet 3   aspirin 81 MG EC tablet Take 81 mg by mouth daily.     atorvastatin (LIPITOR) 10 MG tablet Take 1 tablet (10 mg total) by mouth daily. 90 tablet 3   benzonatate (TESSALON) 100 MG capsule Take 1 capsule (100 mg total) by mouth 3 (three) times daily as needed for cough. Do not take with alcohol or while driving or operating heavy machinery.  May cause drowsiness. 21 capsule 0   Calcium Carbonate-Vitamin D 600-200 MG-UNIT TABS Take 1 tablet by mouth daily.     carvedilol (COREG) 6.25 MG tablet TAKE 1.5 TABLET BY MOUTH TWICE DAILY WITH A MEAL 270 tablet  2   clobetasol ointment (TEMOVATE) 0.05 % Apply bid to affected area for 2 weeks then 3 x a week 45 g 3   cloNIDine (CATAPRES) 0.2 MG tablet Take 1 tablet (0.2 mg total) by mouth daily. 90 tablet 3   FLUoxetine (PROZAC) 40 MG capsule TAKE 1 CAPSULE(40 MG) BY MOUTH DAILY 90 capsule 3   fluticasone (FLONASE) 50 MCG/ACT nasal spray Place 2 sprays into both nostrils daily. 16 g 6   montelukast (SINGULAIR) 10 MG tablet TAKE 1 TABLET(10 MG) BY MOUTH AT BEDTIME 90 tablet 1   pantoprazole (PROTONIX) 40 MG tablet TAKE 1 TABLET(40 MG) BY MOUTH TWICE DAILY BEFORE A MEAL 180 tablet 1   Semaglutide-Weight Management (WEGOVY) 0.25 MG/0.5ML SOAJ Inject 0.25 mg into the skin once a week. 2 mL 0   spironolactone (ALDACTONE) 25 MG tablet TAKE 1  TABLET(25 MG) BY MOUTH DAILY 90 tablet 3   Tiotropium Bromide Monohydrate (SPIRIVA RESPIMAT) 1.25 MCG/ACT AERS Inhale 2 puffs into the lungs daily. 4 g 0   traZODone (DESYREL) 100 MG tablet TAKE 1 TABLET(100 MG) BY MOUTH AT BEDTIME 90 tablet 1   venlafaxine XR (EFFEXOR-XR) 75 MG 24 hr capsule TAKE 1 CAPSULE(75 MG) BY MOUTH DAILY WITH BREAKFAST 90 capsule 1   Vitamin D, Ergocalciferol, (DRISDOL) 1.25 MG (50000 UNIT) CAPS capsule TAKE 1 CAPSULE BY MOUTH 1 TIME A WEEK 12 capsule 1   No current facility-administered medications for this visit.   Allergies:  Olmesartan and Lactose intolerance (gi)   ROS: No syncope.  No claudication.  Physical Exam: VS:  BP 130/78   Pulse 89   Ht '5\' 4"'$  (1.626 m)   Wt 217 lb 12.8 oz (98.8 kg)   SpO2 95%   BMI 37.39 kg/m , BMI Body mass index is 37.39 kg/m.  Wt Readings from Last 3 Encounters:  08/25/22 217 lb 12.8 oz (98.8 kg)  08/17/22 213 lb (96.6 kg)  06/12/22 210 lb (95.3 kg)    General: Patient appears comfortable at rest. HEENT: Conjunctiva and lids normal. Neck: Supple, no elevated JVP or carotid bruits. Lungs: Clear to auscultation, nonlabored breathing at rest. Cardiac: Regular rate and rhythm, no S3 or significant systolic murmur. Extremities: No pitting edema.  ECG:  An ECG dated 09/01/2021 was personally reviewed today and demonstrated:  Sinus rhythm.  Recent Labwork: 01/09/2022: Hemoglobin 12.6; Platelets 330; TSH 0.713 08/17/2022: ALT 23; AST 18; BUN 11; Creatinine, Ser 0.95; Potassium 4.2; Sodium 141     Component Value Date/Time   CHOL 203 (H) 01/09/2022 1104   TRIG 90 01/09/2022 1104   HDL 58 01/09/2022 1104   CHOLHDL 3.5 01/09/2022 1104   CHOLHDL 2.2 01/30/2020 1121   VLDL 19 03/28/2017 1023   LDLCALC 129 (H) 01/09/2022 1104   LDLCALC 71 01/30/2020 1121    Other Studies Reviewed Today:  Lexiscan Myoview 01/23/2019: No diagnostic ST changes to indicate ischemia. Small, mild intensity, partially reversible apical anterior  defect that is consistent with breast attenuation. This is a low risk study. Nuclear stress EF: 75%.  Echocardiogram 01/23/2019:  1. The left ventricle has normal systolic function with an ejection  fraction of 60-65%. The cavity size was normal. There is moderately  increased left ventricular wall thickness. Left ventricular diastolic  Doppler parameters are consistent with impaired   relaxation. No evidence of left ventricular regional wall motion  abnormalities.   2. The right ventricle has normal systolic function. The cavity was  normal. There is no increase in right ventricular wall  thickness. Right  ventricular systolic pressure normal with an estimated pressure of 27.6  mmHg.   3. Trivial pericardial effusion is present.   4. The aortic valve is tricuspid. Mild aortic annular calcification  noted.   5. The mitral valve is grossly normal. There is mild mitral annular  calcification present.   6. The tricuspid valve is grossly normal.   7. The aortic root is normal in size and structure.   Cardiac monitor January 2022: [employer] monitor reviewed, 13 days 8 hours analyzed. Predominant rhythm is sinus with heart rate ranging from 77 bpm up to 146 bpm and average heart rate 96 bpm. Rare PACs were noted representing less than 1% total beats. Rare PVCs were noted representing less than 1% total beats. 3 beat episode of NSVT noted. Patient triggered event corresponded to normal sinus rhythm. There were no sustained arrhythmias or pauses.   Assessment and Plan:  1.  Palpitations with documented atrial and ventricular ectopy by previous cardiac monitor.  No reported increase in symptoms or associated syncope.  She remains on Coreg, no changes were made today.  ECG is normal.  2.  Prediabetes with last LDL 129 in May.  Suggested that she resume statin therapy, we will start Lipitor 10 mg daily.  Recheck FLP for next visit.  3.  Essential hypertension, currently on Norvasc, Coreg,  clonidine, and Aldactone.  No changes were made today.  Medication Adjustments/Labs and Tests Ordered: Current medicines are reviewed at length with the patient today.  Concerns regarding medicines are outlined above.   Tests Ordered: Orders Placed This Encounter  Procedures   Lipid Profile   EKG 12-Lead    Medication Changes: Meds ordered this encounter  Medications   atorvastatin (LIPITOR) 10 MG tablet    Sig: Take 1 tablet (10 mg total) by mouth daily.    Dispense:  90 tablet    Refill:  3    Disposition:  Follow up  6 months.  Signed, Satira Sark, MD, [facility] 08/25/2022 9:50 AM    [place] [facility] [address] [place], [place] [address] Phone: [phone]; Fax: [phone]

## 2022-08-25 ENCOUNTER — Ambulatory Visit: Payer: Medicare PPO | Attending: Cardiology | Admitting: Cardiology

## 2022-08-25 ENCOUNTER — Encounter: Payer: Self-pay | Admitting: Cardiology

## 2022-08-25 VITALS — BP 130/78 | HR 89 | Ht 64.0 in | Wt 217.8 lb

## 2022-08-25 DIAGNOSIS — E782 Mixed hyperlipidemia: Secondary | ICD-10-CM | POA: Diagnosis not present

## 2022-08-25 DIAGNOSIS — R7303 Prediabetes: Secondary | ICD-10-CM | POA: Diagnosis not present

## 2022-08-25 DIAGNOSIS — R002 Palpitations: Secondary | ICD-10-CM

## 2022-08-25 MED ORDER — ATORVASTATIN CALCIUM 10 MG PO TABS: 10.0000 mg | ORAL_TABLET | Freq: Every day | ORAL | 3 refills | Status: DC

## 2022-08-25 NOTE — Patient Instructions (Signed)
Medication Instructions:  START Atorvastatin 10 mg daily at bedtime  Labwork: Fasting Lipids in 6 months  Testing/Procedures: None today  Follow-Up: 6 months  Any Other Special Instructions Will Be Listed Below (If Applicable).  If you need a refill on your cardiac medications before your next appointment, please call your pharmacy.

## 2022-09-25 ENCOUNTER — Ambulatory Visit (INDEPENDENT_AMBULATORY_CARE_PROVIDER_SITE_OTHER): Payer: Medicare PPO | Admitting: Gastroenterology

## 2022-09-25 ENCOUNTER — Encounter: Payer: Self-pay | Admitting: Gastroenterology

## 2022-09-25 ENCOUNTER — Encounter: Payer: Self-pay | Admitting: *Deleted

## 2022-09-25 ENCOUNTER — Telehealth: Payer: Self-pay | Admitting: *Deleted

## 2022-09-25 VITALS — BP 134/82 | HR 83 | Temp 98.1°F | Ht 64.0 in | Wt 216.4 lb

## 2022-09-25 DIAGNOSIS — R1319 Other dysphagia: Secondary | ICD-10-CM

## 2022-09-25 DIAGNOSIS — K219 Gastro-esophageal reflux disease without esophagitis: Secondary | ICD-10-CM

## 2022-09-25 DIAGNOSIS — R1310 Dysphagia, unspecified: Secondary | ICD-10-CM | POA: Insufficient documentation

## 2022-09-25 DIAGNOSIS — Z1211 Encounter for screening for malignant neoplasm of colon: Secondary | ICD-10-CM

## 2022-09-25 MED ORDER — NA SULFATE-K SULFATE-MG SULF 17.5-3.13-1.6 GM/177ML PO SOLN: ORAL | 0 refills | Status: DC

## 2022-09-25 NOTE — Telephone Encounter (Signed)
[employer] PA: Approved Authorization #[id number]  Tracking #[id number] DOS:10/10/22-01/05/23

## 2022-09-25 NOTE — Patient Instructions (Signed)
Upper endoscopy and colonoscopy to be scheduled.  Continue pantoprazole twice daily for now.

## 2022-09-25 NOTE — Progress Notes (Signed)
GI Office Note    Referring Provider: Fayrene Helper, MD Primary Care Physician:  Fayrene Helper, MD  Primary Gastroenterologist: Elon Alas. Abbey Chatters, DO   Chief Complaint   Chief Complaint  Patient presents with   Dysphagia    Having trouble swallowing.     History of Present Illness   Alexis Hurst is a 72 y.o. female presenting today for further evaluation of dysphagia. Last seen in 06/2022. Seen at that time for GERD. Due for screening colonoscopy 2024. No prior EGD. We discussed at last ov, considering EGD at time of colonoscopy to screen for Barrett's. She also had vague infrequent dysphagia that we requested she monitor and return if worsening symptoms.   Patient returns at the request of Dr. Moshe Cipro for dysphagia. She has some symptoms of food or pills feeling like they stick or linger in the upper esophagus region. Eventually goes down or she may even cough it back up. Does not happen often but had an episode before she saw Dr. Moshe Cipro last. No episodes in a few weeks. Heartburn well controlled on PPI BID. No abdominal pain. BM regular mostly. No melena, brbpr.  Medications   Current Outpatient Medications  Medication Sig Dispense Refill   albuterol (VENTOLIN HFA) 108 (90 Base) MCG/ACT inhaler Inhale 2 puffs into the lungs every 6 (six) hours as needed for wheezing or shortness of breath. 8 g 6   amLODipine (NORVASC) 5 MG tablet Take 1 tablet (5 mg total) by mouth daily. 90 tablet 3   aspirin 81 MG EC tablet Take 81 mg by mouth daily.     atorvastatin (LIPITOR) 10 MG tablet Take 1 tablet (10 mg total) by mouth daily. 90 tablet 3   Calcium Carbonate-Vitamin D 600-200 MG-UNIT TABS Take 1 tablet by mouth daily.     carvedilol (COREG) 6.25 MG tablet TAKE 1.5 TABLET BY MOUTH TWICE DAILY WITH A MEAL 270 tablet 2   clobetasol ointment (TEMOVATE) 0.05 % Apply bid to affected area for 2 weeks then 3 x a week 45 g 3   cloNIDine (CATAPRES) 0.2 MG tablet Take 1  tablet (0.2 mg total) by mouth daily. 90 tablet 3   FLUoxetine (PROZAC) 40 MG capsule TAKE 1 CAPSULE(40 MG) BY MOUTH DAILY 90 capsule 3   fluticasone (FLONASE) 50 MCG/ACT nasal spray Place 2 sprays into both nostrils daily. 16 g 6   montelukast (SINGULAIR) 10 MG tablet TAKE 1 TABLET(10 MG) BY MOUTH AT BEDTIME 90 tablet 1   pantoprazole (PROTONIX) 40 MG tablet TAKE 1 TABLET(40 MG) BY MOUTH TWICE DAILY BEFORE A MEAL 180 tablet 1   spironolactone (ALDACTONE) 25 MG tablet TAKE 1 TABLET(25 MG) BY MOUTH DAILY 90 tablet 3   Tiotropium Bromide Monohydrate (SPIRIVA RESPIMAT) 1.25 MCG/ACT AERS Inhale 2 puffs into the lungs daily. 4 g 0   traZODone (DESYREL) 100 MG tablet TAKE 1 TABLET(100 MG) BY MOUTH AT BEDTIME 90 tablet 1   venlafaxine XR (EFFEXOR-XR) 75 MG 24 hr capsule TAKE 1 CAPSULE(75 MG) BY MOUTH DAILY WITH BREAKFAST 90 capsule 1   Vitamin D, Ergocalciferol, (DRISDOL) 1.25 MG (50000 UNIT) CAPS capsule TAKE 1 CAPSULE BY MOUTH 1 TIME A WEEK 12 capsule 1   No current facility-administered medications for this visit.    Allergies   Allergies as of 09/25/2022 - Review Complete 09/25/2022  Allergen Reaction Noted   Olmesartan Cough 02/05/2019   Lactose intolerance (gi) Diarrhea and Nausea And Vomiting 05/07/2013     Past  Medical History   Past Medical History:  Diagnosis Date   Allergic sinusitis 12/18/2012   Allergic urticaria 04/02/2017   Anxiety    COVID-19 virus infection 09/05/2019   Depression    Diverticulosis 12/11/2013   Colonoscopy in 2014 per Dr Oneida Alar, also has small hemmorhoids    Dizziness    Mild Orthostatic   Essential hypertension    Fatigue    History of chest pain    Negative stress echo 2010   Hyperlipidemia    Insomnia    Lab test positive for detection of COVID-19 virus 09/24/2019   Mitral valve prolapse    Non-allergic rhinitis 10/31/2017   Obesity    Palpitations    a. PAC's, PVC's and brief NSVT by prior monitor in 09/2020   Paresthesias    Upper  extremity /episode sensation of coldness and numbness    Piles (hemorrhoids) 07/20/2015   Raynauds phenomenon    Possible   Type 2 diabetes mellitus ([place])     Past Surgical History   Past Surgical History:  Procedure Laterality Date   ABDOMINAL HYSTERECTOMY     BREAST EXCISIONAL BIOPSY Left    BREAST SURGERY     CATARACT EXTRACTION Right 02/03/2020   CATARACT EXTRACTION Left 02/09/2020   COLONOSCOPY  06/11/2003   Smith:multiple medium scatered diverticula in the cecum, a sending colon, transverse colon, descending colon, sigmoid colon.   COLONOSCOPY N/A 05/20/2013   TICS, SML IH   Cystectomy L breast, benign     VESICOVAGINAL FISTULA CLOSURE W/ TAH  1989    Past Family History   Family History  Problem Relation Age of Onset   Hypertension Mother    Urticaria Mother    Heart attack Mother    Heart failure Father    Lung cancer Father    Heart disease Father    Asthma Sister    Heart disease Sister    Hypertension Brother    Crohn's disease Brother    Diabetes Brother    Hypertension Sister    Hypertension Sister    Colon cancer Maternal Grandfather        Age greater than 49   Breast cancer Daughter    Allergic rhinitis Neg Hx    Angioedema Neg Hx    Atopy Neg Hx    Eczema Neg Hx    Immunodeficiency Neg Hx     Past Social History   Social History   Socioeconomic History   Marital status: Married    Spouse name: Alvino   Number of children: 2   Years of education: Not on file   Highest education level: Not on file  Occupational History   Occupation: Pharmacist, hospital   Tobacco Use   Smoking status: Former    Packs/day: 0.50    Years: 16.00    Total pack years: 8.00    Types: Cigarettes    Start date: 54    Quit date: 09/04/1986    Years since quitting: 36.0   Smokeless tobacco: Never  Vaping Use   Vaping Use: Never used  Substance and Sexual Activity   Alcohol use: Yes    Alcohol/week: 0.0 standard drinks of alcohol    Comment: occ   Drug use: No    Sexual activity: Yes    Birth control/protection: Surgical    Comment: hyst  Other Topics Concern   Not on file  Social History Narrative   1 son, 1 daughter   9 grandchildren.   Social Determinants of Health  Financial Resource Strain: Low Risk  (06/29/2021)   Overall Financial Resource Strain (CARDIA)    Difficulty of Paying Living Expenses: Not hard at all  Food Insecurity: No Food Insecurity (06/29/2021)   Hunger Vital Sign    Worried About Running Out of Food in the Last Year: Never true    Ran Out of Food in the Last Year: Never true  Transportation Needs: No Transportation Needs (06/29/2021)   PRAPARE - Hydrologist (Medical): No    Lack of Transportation (Non-Medical): No  Physical Activity: Sufficiently Active (06/29/2021)   Exercise Vital Sign    Days of Exercise per Week: 5 days    Minutes of Exercise per Session: 30 min  Stress: No Stress Concern Present (06/29/2021)   [place]    Feeling of Stress : Only a little  Social Connections: Socially Integrated (06/29/2021)   Social Connection and Isolation Panel [NHANES]    Frequency of Communication with Friends and Family: More than three times a week    Frequency of Social Gatherings with Friends and Family: More than three times a week    Attends Religious Services: More than 4 times per year    Active Member of [employer] or Organizations: Yes    Attends Music therapist: More than 4 times per year    Marital Status: Married  Human resources officer Violence: Not At Risk (06/29/2021)   Humiliation, Afraid, Rape, and Kick questionnaire    Fear of Current or Ex-Partner: No    Emotionally Abused: No    Physically Abused: No    Sexually Abused: No    Review of Systems   General: Negative for anorexia, weight loss, fever, chills, fatigue, weakness. ENT: Negative for hoarseness,  nasal congestion. See hpi CV:  Negative for chest pain, angina, palpitations, dyspnea on exertion, peripheral edema.  Respiratory: Negative for dyspnea at rest, dyspnea on exertion, cough, sputum, wheezing.  GI: See history of present illness. GU:  Negative for dysuria, hematuria, urinary incontinence, urinary frequency, nocturnal urination.  Endo: Negative for unusual weight change.     Physical Exam   BP 134/82 (BP Location: Right Arm, Patient Position: Sitting, Cuff Size: Large)   Pulse 83   Temp 98.1 F (36.7 C) (Oral)   Ht '5\' 4"'$  (1.626 m)   Wt 216 lb 6.4 oz (98.2 kg)   SpO2 97%   BMI 37.14 kg/m    General: Well-nourished, well-developed in no acute distress.  Eyes: No icterus. Mouth: Oropharyngeal mucosa moist and pink   Lungs: Clear to auscultation bilaterally.  Heart: Regular rate and rhythm, no murmurs rubs or gallops.  Abdomen: Bowel sounds are normal, nontender, nondistended, no hepatosplenomegaly or masses,  no abdominal bruits or hernia , no rebound or guarding.  Rectal: not performed Extremities: No lower extremity edema. No clubbing or deformities. Neuro: Alert and oriented x 4   Skin: Warm and dry, no jaundice.   Psych: Alert and cooperative, normal mood and affect.  Labs   Lab Results  Component Value Date   CREATININE 0.95 08/17/2022   BUN 11 08/17/2022   NA 141 08/17/2022   K 4.2 08/17/2022   CL 104 08/17/2022   CO2 23 08/17/2022   Lab Results  Component Value Date   ALT 23 08/17/2022   AST 18 08/17/2022   ALKPHOS 101 08/17/2022   BILITOT 0.2 08/17/2022   Lab Results  Component Value Date   WBC 6.8 01/09/2022  HGB 12.6 01/09/2022   HCT 39.0 01/09/2022   MCV 88 01/09/2022   PLT 330 01/09/2022   Lab Results  Component Value Date   HGBA1C 6.3 (H) 08/17/2022    Imaging Studies   No results found.  Assessment   GERD: heartburn well controlled on PPI BID. We suggested at last visit to try once daily dosing but she did not try decreasing dose. For now will keep  current dose until after EGD/ED.   Dysphagia: has had several episodes of food or pills sticking and sometimes coughing back up, still infrequent but PCP encouraging her to proceed with EGD. She has never had EGD. Recommend screening for Barrett's as well.   Colon cancer screening: due colonoscopy this year.    PLAN   Colonoscopy/EGD/ED with Dr. Abbey Chatters. ASA 2.  I have discussed the risks, alternatives, benefits with regards to but not limited to the risk of reaction to medication, bleeding, infection, perforation and the patient is agreeable to proceed. Written consent to be obtained. Continue pantoprazole '40mg'$  BID before meals for now.    Laureen Ochs. Bobby Rumpf, [place], [facility]

## 2022-09-25 NOTE — H&P (View-Only) (Signed)
GI Office Note    Referring Provider: Fayrene Helper, MD Primary Care Physician:  Fayrene Helper, MD  Primary Gastroenterologist: Elon Alas. Abbey Chatters, DO   Chief Complaint   Chief Complaint  Patient presents with   Dysphagia    Having trouble swallowing.     History of Present Illness   Alexis Hurst is a 72 y.o. female presenting today for further evaluation of dysphagia. Last seen in 06/2022. Seen at that time for GERD. Due for screening colonoscopy 2024. No prior EGD. We discussed at last ov, considering EGD at time of colonoscopy to screen for Barrett's. She also had vague infrequent dysphagia that we requested she monitor and return if worsening symptoms.   Patient returns at the request of Dr. Moshe Cipro for dysphagia. She has some symptoms of food or pills feeling like they stick or linger in the upper esophagus region. Eventually goes down or she may even cough it back up. Does not happen often but had an episode before she saw Dr. Moshe Cipro last. No episodes in a few weeks. Heartburn well controlled on PPI BID. No abdominal pain. BM regular mostly. No melena, brbpr.  Medications   Current Outpatient Medications  Medication Sig Dispense Refill   albuterol (VENTOLIN HFA) 108 (90 Base) MCG/ACT inhaler Inhale 2 puffs into the lungs every 6 (six) hours as needed for wheezing or shortness of breath. 8 g 6   amLODipine (NORVASC) 5 MG tablet Take 1 tablet (5 mg total) by mouth daily. 90 tablet 3   aspirin 81 MG EC tablet Take 81 mg by mouth daily.     atorvastatin (LIPITOR) 10 MG tablet Take 1 tablet (10 mg total) by mouth daily. 90 tablet 3   Calcium Carbonate-Vitamin D 600-200 MG-UNIT TABS Take 1 tablet by mouth daily.     carvedilol (COREG) 6.25 MG tablet TAKE 1.5 TABLET BY MOUTH TWICE DAILY WITH A MEAL 270 tablet 2   clobetasol ointment (TEMOVATE) 0.05 % Apply bid to affected area for 2 weeks then 3 x a week 45 g 3   cloNIDine (CATAPRES) 0.2 MG tablet Take 1  tablet (0.2 mg total) by mouth daily. 90 tablet 3   FLUoxetine (PROZAC) 40 MG capsule TAKE 1 CAPSULE(40 MG) BY MOUTH DAILY 90 capsule 3   fluticasone (FLONASE) 50 MCG/ACT nasal spray Place 2 sprays into both nostrils daily. 16 g 6   montelukast (SINGULAIR) 10 MG tablet TAKE 1 TABLET(10 MG) BY MOUTH AT BEDTIME 90 tablet 1   pantoprazole (PROTONIX) 40 MG tablet TAKE 1 TABLET(40 MG) BY MOUTH TWICE DAILY BEFORE A MEAL 180 tablet 1   spironolactone (ALDACTONE) 25 MG tablet TAKE 1 TABLET(25 MG) BY MOUTH DAILY 90 tablet 3   Tiotropium Bromide Monohydrate (SPIRIVA RESPIMAT) 1.25 MCG/ACT AERS Inhale 2 puffs into the lungs daily. 4 g 0   traZODone (DESYREL) 100 MG tablet TAKE 1 TABLET(100 MG) BY MOUTH AT BEDTIME 90 tablet 1   venlafaxine XR (EFFEXOR-XR) 75 MG 24 hr capsule TAKE 1 CAPSULE(75 MG) BY MOUTH DAILY WITH BREAKFAST 90 capsule 1   Vitamin D, Ergocalciferol, (DRISDOL) 1.25 MG (50000 UNIT) CAPS capsule TAKE 1 CAPSULE BY MOUTH 1 TIME A WEEK 12 capsule 1   No current facility-administered medications for this visit.    Allergies   Allergies as of 09/25/2022 - Review Complete 09/25/2022  Allergen Reaction Noted   Olmesartan Cough 02/05/2019   Lactose intolerance (gi) Diarrhea and Nausea And Vomiting 05/07/2013     Past  Medical History   Past Medical History:  Diagnosis Date   Allergic sinusitis 12/18/2012   Allergic urticaria 04/02/2017   Anxiety    COVID-19 virus infection 09/05/2019   Depression    Diverticulosis 12/11/2013   Colonoscopy in 2014 per Dr Oneida Alar, also has small hemmorhoids    Dizziness    Mild Orthostatic   Essential hypertension    Fatigue    History of chest pain    Negative stress echo 2010   Hyperlipidemia    Insomnia    Lab test positive for detection of COVID-19 virus 09/24/2019   Mitral valve prolapse    Non-allergic rhinitis 10/31/2017   Obesity    Palpitations    a. PAC's, PVC's and brief NSVT by prior monitor in 09/2020   Paresthesias    Upper  extremity /episode sensation of coldness and numbness    Piles (hemorrhoids) 07/20/2015   Raynauds phenomenon    Possible   Type 2 diabetes mellitus ([place])     Past Surgical History   Past Surgical History:  Procedure Laterality Date   ABDOMINAL HYSTERECTOMY     BREAST EXCISIONAL BIOPSY Left    BREAST SURGERY     CATARACT EXTRACTION Right 02/03/2020   CATARACT EXTRACTION Left 02/09/2020   COLONOSCOPY  06/11/2003   Smith:multiple medium scatered diverticula in the cecum, a sending colon, transverse colon, descending colon, sigmoid colon.   COLONOSCOPY N/A 05/20/2013   TICS, SML IH   Cystectomy L breast, benign     VESICOVAGINAL FISTULA CLOSURE W/ TAH  1989    Past Family History   Family History  Problem Relation Age of Onset   Hypertension Mother    Urticaria Mother    Heart attack Mother    Heart failure Father    Lung cancer Father    Heart disease Father    Asthma Sister    Heart disease Sister    Hypertension Brother    Crohn's disease Brother    Diabetes Brother    Hypertension Sister    Hypertension Sister    Colon cancer Maternal Grandfather        Age greater than 59   Breast cancer Daughter    Allergic rhinitis Neg Hx    Angioedema Neg Hx    Atopy Neg Hx    Eczema Neg Hx    Immunodeficiency Neg Hx     Past Social History   Social History   Socioeconomic History   Marital status: Married    Spouse name: Alvino   Number of children: 2   Years of education: Not on file   Highest education level: Not on file  Occupational History   Occupation: Pharmacist, hospital   Tobacco Use   Smoking status: Former    Packs/day: 0.50    Years: 16.00    Total pack years: 8.00    Types: Cigarettes    Start date: 61    Quit date: 09/04/1986    Years since quitting: 36.0   Smokeless tobacco: Never  Vaping Use   Vaping Use: Never used  Substance and Sexual Activity   Alcohol use: Yes    Alcohol/week: 0.0 standard drinks of alcohol    Comment: occ   Drug use: No    Sexual activity: Yes    Birth control/protection: Surgical    Comment: hyst  Other Topics Concern   Not on file  Social History Narrative   1 son, 1 daughter   9 grandchildren.   Social Determinants of Health  Financial Resource Strain: Low Risk  (06/29/2021)   Overall Financial Resource Strain (CARDIA)    Difficulty of Paying Living Expenses: Not hard at all  Food Insecurity: No Food Insecurity (06/29/2021)   Hunger Vital Sign    Worried About Running Out of Food in the Last Year: Never true    Ran Out of Food in the Last Year: Never true  Transportation Needs: No Transportation Needs (06/29/2021)   PRAPARE - Hydrologist (Medical): No    Lack of Transportation (Non-Medical): No  Physical Activity: Sufficiently Active (06/29/2021)   Exercise Vital Sign    Days of Exercise per Week: 5 days    Minutes of Exercise per Session: 30 min  Stress: No Stress Concern Present (06/29/2021)   [place]    Feeling of Stress : Only a little  Social Connections: Socially Integrated (06/29/2021)   Social Connection and Isolation Panel [NHANES]    Frequency of Communication with Friends and Family: More than three times a week    Frequency of Social Gatherings with Friends and Family: More than three times a week    Attends Religious Services: More than 4 times per year    Active Member of [employer] or Organizations: Yes    Attends Music therapist: More than 4 times per year    Marital Status: Married  Human resources officer Violence: Not At Risk (06/29/2021)   Humiliation, Afraid, Rape, and Kick questionnaire    Fear of Current or Ex-Partner: No    Emotionally Abused: No    Physically Abused: No    Sexually Abused: No    Review of Systems   General: Negative for anorexia, weight loss, fever, chills, fatigue, weakness. ENT: Negative for hoarseness,  nasal congestion. See hpi CV:  Negative for chest pain, angina, palpitations, dyspnea on exertion, peripheral edema.  Respiratory: Negative for dyspnea at rest, dyspnea on exertion, cough, sputum, wheezing.  GI: See history of present illness. GU:  Negative for dysuria, hematuria, urinary incontinence, urinary frequency, nocturnal urination.  Endo: Negative for unusual weight change.     Physical Exam   BP 134/82 (BP Location: Right Arm, Patient Position: Sitting, Cuff Size: Large)   Pulse 83   Temp 98.1 F (36.7 C) (Oral)   Ht '5\' 4"'$  (1.626 m)   Wt 216 lb 6.4 oz (98.2 kg)   SpO2 97%   BMI 37.14 kg/m    General: Well-nourished, well-developed in no acute distress.  Eyes: No icterus. Mouth: Oropharyngeal mucosa moist and pink   Lungs: Clear to auscultation bilaterally.  Heart: Regular rate and rhythm, no murmurs rubs or gallops.  Abdomen: Bowel sounds are normal, nontender, nondistended, no hepatosplenomegaly or masses,  no abdominal bruits or hernia , no rebound or guarding.  Rectal: not performed Extremities: No lower extremity edema. No clubbing or deformities. Neuro: Alert and oriented x 4   Skin: Warm and dry, no jaundice.   Psych: Alert and cooperative, normal mood and affect.  Labs   Lab Results  Component Value Date   CREATININE 0.95 08/17/2022   BUN 11 08/17/2022   NA 141 08/17/2022   K 4.2 08/17/2022   CL 104 08/17/2022   CO2 23 08/17/2022   Lab Results  Component Value Date   ALT 23 08/17/2022   AST 18 08/17/2022   ALKPHOS 101 08/17/2022   BILITOT 0.2 08/17/2022   Lab Results  Component Value Date   WBC 6.8 01/09/2022  HGB 12.6 01/09/2022   HCT 39.0 01/09/2022   MCV 88 01/09/2022   PLT 330 01/09/2022   Lab Results  Component Value Date   HGBA1C 6.3 (H) 08/17/2022    Imaging Studies   No results found.  Assessment   GERD: heartburn well controlled on PPI BID. We suggested at last visit to try once daily dosing but she did not try decreasing dose. For now will keep  current dose until after EGD/ED.   Dysphagia: has had several episodes of food or pills sticking and sometimes coughing back up, still infrequent but PCP encouraging her to proceed with EGD. She has never had EGD. Recommend screening for Barrett's as well.   Colon cancer screening: due colonoscopy this year.    PLAN   Colonoscopy/EGD/ED with Dr. Abbey Chatters. ASA 2.  I have discussed the risks, alternatives, benefits with regards to but not limited to the risk of reaction to medication, bleeding, infection, perforation and the patient is agreeable to proceed. Written consent to be obtained. Continue pantoprazole '40mg'$  BID before meals for now.    Laureen Ochs. Bobby Rumpf, [place], [facility]

## 2022-09-25 NOTE — Telephone Encounter (Signed)
Pt has been scheduled for 10/10/22, instrucitons mailed and prep sent to the pharmacy. Pt was instructed to call if prep too expensive.

## 2022-10-02 ENCOUNTER — Other Ambulatory Visit: Payer: Self-pay | Admitting: Student

## 2022-10-02 ENCOUNTER — Other Ambulatory Visit: Payer: Self-pay | Admitting: Family Medicine

## 2022-10-03 NOTE — Telephone Encounter (Signed)
Called [facility] [place] and spoke with Sharl Ma H and was informed NO PA needed for either procedure.

## 2022-10-06 ENCOUNTER — Other Ambulatory Visit (HOSPITAL_COMMUNITY)
Admission: RE | Admit: 2022-10-06 | Discharge: 2022-10-06 | Disposition: A | Discharge status: Home / Self Care | Payer: Medicare PPO | Source: Ambulatory Visit | Attending: Internal Medicine | Admitting: Internal Medicine

## 2022-10-06 DIAGNOSIS — Z1211 Encounter for screening for malignant neoplasm of colon: Secondary | ICD-10-CM

## 2022-10-06 DIAGNOSIS — K219 Gastro-esophageal reflux disease without esophagitis: Secondary | ICD-10-CM | POA: Diagnosis not present

## 2022-10-06 DIAGNOSIS — R1319 Other dysphagia: Secondary | ICD-10-CM

## 2022-10-06 LAB — BASIC METABOLIC PANEL
Anion gap: 8 (ref 5–15)
BUN: 14 mg/dL (ref 8–23)
CO2: 27 mmol/L (ref 22–32)
Calcium: 9.2 mg/dL (ref 8.9–10.3)
Chloride: 103 mmol/L (ref 98–111)
Creatinine, Ser: 0.97 mg/dL (ref 0.44–1.00)
GFR, Estimated: >60 mL/min (ref >60)
Glucose, Bld: 63 mg/dL — ABNORMAL LOW (ref 70–99)
Potassium: 4 mmol/L (ref 3.5–5.1)
Sodium: 138 mmol/L (ref 135–145)

## 2022-10-10 ENCOUNTER — Other Ambulatory Visit: Payer: Self-pay

## 2022-10-10 ENCOUNTER — Ambulatory Visit (HOSPITAL_BASED_OUTPATIENT_CLINIC_OR_DEPARTMENT_OTHER): Payer: Medicare PPO | Admitting: Anesthesiology

## 2022-10-10 ENCOUNTER — Ambulatory Visit (HOSPITAL_COMMUNITY)
Admission: RE | Admit: 2022-10-10 | Discharge: 2022-10-10 | Disposition: A | Discharge status: Home / Self Care | Payer: Medicare PPO | Attending: Internal Medicine | Admitting: Internal Medicine

## 2022-10-10 ENCOUNTER — Ambulatory Visit (HOSPITAL_COMMUNITY): Payer: Medicare PPO | Admitting: Anesthesiology

## 2022-10-10 ENCOUNTER — Encounter (HOSPITAL_COMMUNITY)
Admission: RE | Disposition: A | Discharge status: Home / Self Care | Payer: Self-pay | Source: Home / Self Care | Attending: Internal Medicine

## 2022-10-10 ENCOUNTER — Encounter (HOSPITAL_COMMUNITY): Payer: Self-pay

## 2022-10-10 DIAGNOSIS — E119 Type 2 diabetes mellitus without complications: Secondary | ICD-10-CM | POA: Insufficient documentation

## 2022-10-10 DIAGNOSIS — F32A Depression, unspecified: Secondary | ICD-10-CM | POA: Diagnosis not present

## 2022-10-10 DIAGNOSIS — Z6837 Body mass index (BMI) 37.0-37.9, adult: Secondary | ICD-10-CM | POA: Diagnosis not present

## 2022-10-10 DIAGNOSIS — R1310 Dysphagia, unspecified: Secondary | ICD-10-CM

## 2022-10-10 DIAGNOSIS — Z1211 Encounter for screening for malignant neoplasm of colon: Secondary | ICD-10-CM

## 2022-10-10 DIAGNOSIS — Z13810 Encounter for screening for upper gastrointestinal disorder: Secondary | ICD-10-CM | POA: Diagnosis not present

## 2022-10-10 DIAGNOSIS — K5730 Diverticulosis of large intestine without perforation or abscess without bleeding: Secondary | ICD-10-CM | POA: Diagnosis not present

## 2022-10-10 DIAGNOSIS — K219 Gastro-esophageal reflux disease without esophagitis: Secondary | ICD-10-CM | POA: Insufficient documentation

## 2022-10-10 DIAGNOSIS — K225 Diverticulum of esophagus, acquired: Secondary | ICD-10-CM | POA: Diagnosis not present

## 2022-10-10 DIAGNOSIS — F418 Other specified anxiety disorders: Secondary | ICD-10-CM | POA: Diagnosis not present

## 2022-10-10 DIAGNOSIS — K449 Diaphragmatic hernia without obstruction or gangrene: Secondary | ICD-10-CM

## 2022-10-10 DIAGNOSIS — Z79899 Other long term (current) drug therapy: Secondary | ICD-10-CM | POA: Insufficient documentation

## 2022-10-10 DIAGNOSIS — F419 Anxiety disorder, unspecified: Secondary | ICD-10-CM | POA: Insufficient documentation

## 2022-10-10 DIAGNOSIS — E669 Obesity, unspecified: Secondary | ICD-10-CM | POA: Diagnosis not present

## 2022-10-10 DIAGNOSIS — K5790 Diverticulosis of intestine, part unspecified, without perforation or abscess without bleeding: Secondary | ICD-10-CM

## 2022-10-10 DIAGNOSIS — I10 Essential (primary) hypertension: Secondary | ICD-10-CM | POA: Diagnosis not present

## 2022-10-10 DIAGNOSIS — E785 Hyperlipidemia, unspecified: Secondary | ICD-10-CM | POA: Diagnosis not present

## 2022-10-10 DIAGNOSIS — Z87891 Personal history of nicotine dependence: Secondary | ICD-10-CM | POA: Insufficient documentation

## 2022-10-10 HISTORY — DX: Other specified soft tissue disorders: M79.89

## 2022-10-10 HISTORY — PX: COLONOSCOPY WITH PROPOFOL: SHX5780

## 2022-10-10 HISTORY — PX: ESOPHAGOGASTRODUODENOSCOPY (EGD) WITH PROPOFOL: SHX5813

## 2022-10-10 SURGERY — COLONOSCOPY WITH PROPOFOL
Anesthesia: General

## 2022-10-10 MED ORDER — STERILE WATER FOR IRRIGATION IR SOLN
Status: DC | PRN
  Administered 2022-10-10 (×2): 60 mL

## 2022-10-10 MED ORDER — PROPOFOL 10 MG/ML IV BOLUS
INTRAVENOUS | Status: DC | PRN
  Administered 2022-10-10: 100 mg via INTRAVENOUS

## 2022-10-10 MED ORDER — PROPOFOL 500 MG/50ML IV EMUL
INTRAVENOUS | Status: DC | PRN
  Administered 2022-10-10: 150 ug/kg/min via INTRAVENOUS

## 2022-10-10 MED ORDER — LACTATED RINGERS IV SOLN: INTRAVENOUS | Status: DC

## 2022-10-10 NOTE — Discharge Instructions (Addendum)
EGD Discharge instructions Please read the instructions outlined below and refer to this sheet in the next few weeks. These discharge instructions provide you with general information on caring for yourself after you leave the hospital. Your doctor may also give you specific instructions. While your treatment has been planned according to the most current medical practices available, unavoidable complications occasionally occur. If you have any problems or questions after discharge, please call your doctor. ACTIVITY You may resume your regular activity but move at a slower pace for the next 24 hours.  Take frequent rest periods for the next 24 hours.  Walking will help expel (get rid of) the air and reduce the bloated feeling in your abdomen.  No driving for 24 hours (because of the anesthesia (medicine) used during the test).  You may shower.  Do not sign any important legal documents or operate any machinery for 24 hours (because of the anesthesia used during the test).  NUTRITION Drink plenty of fluids.  You may resume your normal diet.  Begin with a light meal and progress to your normal diet.  Avoid alcoholic beverages for 24 hours or as instructed by your caregiver.  MEDICATIONS You may resume your normal medications unless your caregiver tells you otherwise.  WHAT YOU CAN EXPECT TODAY You may experience abdominal discomfort such as a feeling of fullness or "gas" pains.  FOLLOW-UP Your doctor will discuss the results of your test with you.  SEEK IMMEDIATE MEDICAL ATTENTION IF ANY OF THE FOLLOWING OCCUR: Excessive nausea (feeling sick to your stomach) and/or vomiting.  Severe abdominal pain and distention (swelling).  Trouble swallowing.  Temperature over 101 F (37.8 C).  Rectal bleeding or vomiting of blood.     Colonoscopy Discharge Instructions  Read the instructions outlined below and refer to this sheet in the next few weeks. These discharge instructions provide you with  general information on caring for yourself after you leave the hospital. Your doctor may also give you specific instructions. While your treatment has been planned according to the most current medical practices available, unavoidable complications occasionally occur.   ACTIVITY You may resume your regular activity, but move at a slower pace for the next 24 hours.  Take frequent rest periods for the next 24 hours.  Walking will help get rid of the air and reduce the bloated feeling in your belly (abdomen).  No driving for 24 hours (because of the medicine (anesthesia) used during the test).   Do not sign any important legal documents or operate any machinery for 24 hours (because of the anesthesia used during the test).  NUTRITION Drink plenty of fluids.  You may resume your normal diet as instructed by your doctor.  Begin with a light meal and progress to your normal diet. Heavy or fried foods are harder to digest and may make you feel sick to your stomach (nauseated).  Avoid alcoholic beverages for 24 hours or as instructed.  MEDICATIONS You may resume your normal medications unless your doctor tells you otherwise.  WHAT YOU CAN EXPECT TODAY Some feelings of bloating in the abdomen.  Passage of more gas than usual.  Spotting of blood in your stool or on the toilet paper.  IF YOU HAD POLYPS REMOVED DURING THE COLONOSCOPY: No aspirin products for 7 days or as instructed.  No alcohol for 7 days or as instructed.  Eat a soft diet for the next 24 hours.  FINDING OUT THE RESULTS OF YOUR TEST Not all test results are  available during your visit. If your test results are not back during the visit, make an appointment with your caregiver to find out the results. Do not assume everything is normal if you have not heard from your caregiver or the medical facility. It is important for you to follow up on all of your test results.  SEEK IMMEDIATE MEDICAL ATTENTION IF: You have more than a spotting of  blood in your stool.  Your belly is swollen (abdominal distention).  You are nauseated or vomiting.  You have a temperature over 101.  You have abdominal pain or discomfort that is severe or gets worse throughout the day.   Your upper endoscopy revealed a small outpouching in the back of your throat called a Zenker's diverticulum.  This is likely the cause of food/pills getting stuck.  Esophagus was wide open, no strictures.  Small hiatal hernia.  Stomach and small bowel appeared normal.  Continue on pantoprazole twice daily.  Your colonoscopy was relatively unremarkable.  I did not find any polyps or evidence of colon cancer.  I recommend repeating colonoscopy in 10 years for colon cancer screening purposes.  You do have diverticulosis and internal hemorrhoids. I would recommend increasing fiber in your diet or adding OTC Benefiber/Metamucil. Be sure to drink at least 4 to 6 glasses of water daily. Follow-up with GI 3 months OFFICE TO CALL WITH APPOINTMENT   I hope you have a great rest of your week!  Elon Alas. Abbey Chatters, D.O. Gastroenterology and Hepatology [facility]

## 2022-10-10 NOTE — Op Note (Signed)
[facility] Patient Name: Alexis Hurst Procedure Date: 10/10/2022 10:59 AM MRN: [id number] Date of Birth: 06-Feb-1951 Attending MD: Elon Alas. Abbey Chatters , [place], [id number] CSN: [id number] Age: 72 Admit Type: Outpatient Procedure:                Upper GI endoscopy Indications:              Screening for Barrett's esophagus, Dysphagia,                            Heartburn Providers:                Elon Alas. Abbey Chatters, DO, [place] Page, [place]                            Risa Grill, Technician Referring MD:              Medicines:                See the Anesthesia note for documentation of the                            administered medications Complications:            No immediate complications. Estimated Blood Loss:     Estimated blood loss: none. Procedure:                Pre-Anesthesia Assessment:                           - The anesthesia plan was to use monitored                            anesthesia care (MAC).                           After obtaining informed consent, the endoscope was                            passed under direct vision. Throughout the                            procedure, the patient's blood pressure, pulse, and                            oxygen saturations were monitored continuously. The                            [id number]-H190 ([id number]) scope was introduced through the                            mouth, and advanced to the second part of duodenum.                            The upper GI endoscopy was accomplished without                            difficulty. The patient tolerated the procedure  well. Scope In: 11:11:02 AM Scope Out: 11:13:57 AM Total Procedure Duration: 0 hours 2 minutes 55 seconds  Findings:      Zenker's diverticulum noted in the posterior oropharynx.      A 2 cm hiatal hernia was present.      The Z-line was regular and was found 36 cm from the incisors.      The entire examined stomach was normal.      The  duodenal bulb, first portion of the duodenum and second portion of       the duodenum were normal. Impression:               - Zenker's diverticulum.                           - 2 cm hiatal hernia.                           - Z-line regular, 36 cm from the incisors.                           - Normal stomach.                           - Normal duodenal bulb, first portion of the                            duodenum and second portion of the duodenum.                           - No specimens collected. Moderate Sedation:      Per Anesthesia Care Recommendation:           - Patient has a contact number available for                            emergencies. The signs and symptoms of potential                            delayed complications were discussed with the                            patient. Return to normal activities tomorrow.                            Written discharge instructions were provided to the                            patient.                           - Resume previous diet.                           - Continue present medications.                           - Await pathology results.                           -  Oropharyngeal dysphagia likely due to Zenker's                            diverticulum. Symptoms mild, contineu to monitor.                            Plenty of fluids with meals and pills. Consider                            surgical/endoscopic fixation if symptoms worsen. Procedure Code(s):        --- Professional ---                           [phone], Esophagogastroduodenoscopy, flexible,                            transoral; diagnostic, including collection of                            specimen(s) by brushing or washing, when performed                            (separate procedure) Diagnosis Code(s):        --- Professional ---                           K44.9, Diaphragmatic hernia without obstruction or                            gangrene                            Z13.810, Encounter for screening for upper                            gastrointestinal disorder                           R13.10, Dysphagia, unspecified                           R12, Heartburn CPT copyright 2022 American Medical Association. All rights reserved. The codes documented in this report are preliminary and upon coder review may  be revised to meet current compliance requirements. Elon Alas. Abbey Chatters, DO [place] Abbey Chatters, DO 10/10/2022 11:20:07 AM This report has been signed electronically. Number of Addenda: 0

## 2022-10-10 NOTE — Transfer of Care (Signed)
Immediate Anesthesia Transfer of Care Note  Patient: Alexis Hurst  Procedure(s) Performed: COLONOSCOPY WITH PROPOFOL ESOPHAGOGASTRODUODENOSCOPY (EGD) WITH PROPOFOL  Patient Location: Short Stay  Anesthesia Type:General  Level of Consciousness: awake, alert , oriented, and patient cooperative  Airway & Oxygen Therapy: Patient Spontanous Breathing  Post-op Assessment: Report given to RN, Post -op Vital signs reviewed and stable, and Patient moving all extremities X 4  Post vital signs: Reviewed and stable  Last Vitals:  Vitals Value Taken Time  BP    Temp    Pulse    Resp    SpO2      Last Pain:  Vitals:   10/10/22 1104  TempSrc:   PainSc: 0-No pain      Patients Stated Pain Goal: 3 (00/17/49 4496)  Complications: No notable events documented.

## 2022-10-10 NOTE — Op Note (Signed)
[facility] Patient Name: Alexis Hurst Procedure Date: 10/10/2022 11:17 AM MRN: [id number] Date of Birth: 07/08/1951 Attending MD: Elon Alas. Abbey Chatters , [place], [id number] CSN: [id number] Age: 72 Admit Type: Outpatient Procedure:                Colonoscopy Indications:              Screening for colorectal malignant neoplasm Providers:                Elon Alas. Abbey Chatters, DO, [place], [place]                            Risa Grill, Technician Referring MD:              Medicines:                See the Anesthesia note for documentation of the                            administered medications Complications:            No immediate complications. Estimated Blood Loss:     Estimated blood loss: none. Procedure:                Pre-Anesthesia Assessment:                           - The anesthesia plan was to use monitored                            anesthesia care (MAC).                           After obtaining informed consent, the colonoscope                            was passed under direct vision. Throughout the                            procedure, the patient's blood pressure, pulse, and                            oxygen saturations were monitored continuously. The                            [id number]-HQ190L ([id number]) scope was introduced through                            the anus and advanced to the the cecum, identified                            by appendiceal orifice and ileocecal valve. The                            colonoscopy was performed without difficulty. The                            patient tolerated the procedure well.  The quality                            of the bowel preparation was evaluated using the                            BBPS [facility] Bowel Preparation Scale) with scores                            of: Right Colon = 3, Transverse Colon = 3 and Left                            Colon = 3 (entire mucosa seen well with no residual                             staining, small fragments of stool or opaque                            liquid). The total BBPS score equals 9. Scope In: 11:20:52 AM Scope Out: 11:34:00 AM Scope Withdrawal Time: 0 hours 9 minutes 18 seconds  Total Procedure Duration: 0 hours 13 minutes 8 seconds  Findings:      The perianal and digital rectal examinations were normal.      Many large-mouthed and small-mouthed diverticula were found in the       sigmoid colon, descending colon and transverse colon.      The exam was otherwise without abnormality. Impression:               - Diverticulosis in the sigmoid colon, in the                            descending colon and in the transverse colon.                           - The examination was otherwise normal.                           - No specimens collected. Moderate Sedation:      Per Anesthesia Care Recommendation:           - Patient has a contact number available for                            emergencies. The signs and symptoms of potential                            delayed complications were discussed with the                            patient. Return to normal activities tomorrow.                            Written discharge instructions were provided to the  patient.                           - Resume previous diet.                           - Continue present medications.                           - Repeat colonoscopy in 10 years for screening                            purposes if benefits outweigh risks.                           - Return to GI clinic in 3 months. Procedure Code(s):        --- Professional ---                           W0981, Colorectal cancer screening; colonoscopy on                            individual not meeting criteria for high risk Diagnosis Code(s):        --- Professional ---                           Z12.11, Encounter for screening for malignant                            neoplasm of colon                            K57.30, Diverticulosis of large intestine without                            perforation or abscess without bleeding CPT copyright 2022 American Medical Association. All rights reserved. The codes documented in this report are preliminary and upon coder review may  be revised to meet current compliance requirements. Elon Alas. Abbey Chatters, DO [place] Abbey Chatters, DO 10/10/2022 11:40:51 AM This report has been signed electronically. Number of Addenda: 0

## 2022-10-10 NOTE — Interval H&P Note (Signed)
History and Physical Interval Note:  10/10/2022 10:52 AM  Alexis Hurst  has presented today for surgery, with the diagnosis of screening colonoscopy, dysphagia,GERD.  The various methods of treatment have been discussed with the patient and family. After consideration of risks, benefits and other options for treatment, the patient has consented to  Procedure(s) with comments: COLONOSCOPY WITH PROPOFOL (N/A) - 11:30 am ESOPHAGOGASTRODUODENOSCOPY (EGD) WITH PROPOFOL (N/A) BALLOON DILATION (N/A) as a surgical intervention.  The patient's history has been reviewed, patient examined, no change in status, stable for surgery.  I have reviewed the patient's chart and labs.  Questions were answered to the patient's satisfaction.     Eloise Harman

## 2022-10-10 NOTE — Anesthesia Preprocedure Evaluation (Signed)
Anesthesia Evaluation  Patient identified by MRN, date of birth, ID band Patient awake    Reviewed: Allergy & Precautions, H&P , NPO status , Patient's Chart, lab work & pertinent test results, reviewed documented beta blocker date and time   Airway Mallampati: II  TM Distance: >3 FB Neck ROM: full    Dental no notable dental hx.    Pulmonary neg pulmonary ROS, former smoker   Pulmonary exam normal breath sounds clear to auscultation       Cardiovascular Exercise Tolerance: Good hypertension, negative cardio ROS  Rhythm:regular Rate:Normal     Neuro/Psych  PSYCHIATRIC DISORDERS Anxiety Depression    negative neurological ROS  negative psych ROS   GI/Hepatic negative GI ROS, Neg liver ROS,GERD  ,,  Endo/Other  negative endocrine ROS    Renal/GU negative Renal ROS  negative genitourinary   Musculoskeletal   Abdominal   Peds  Hematology negative hematology ROS (+)   Anesthesia Other Findings   Reproductive/Obstetrics negative OB ROS                             Anesthesia Physical Anesthesia Plan  ASA: 2  Anesthesia Plan: General   Post-op Pain Management:    Induction:   PONV Risk Score and Plan: Propofol infusion  Airway Management Planned:   Additional Equipment:   Intra-op Plan:   Post-operative Plan:   Informed Consent: I have reviewed the patients History and Physical, chart, labs and discussed the procedure including the risks, benefits and alternatives for the proposed anesthesia with the patient or authorized representative who has indicated his/her understanding and acceptance.     Dental Advisory Given  Plan Discussed with: CRNA  Anesthesia Plan Comments:        Anesthesia Quick Evaluation

## 2022-10-12 NOTE — Anesthesia Postprocedure Evaluation (Signed)
Anesthesia Post Note  Patient: Katelind Pytel  Procedure(s) Performed: COLONOSCOPY WITH PROPOFOL ESOPHAGOGASTRODUODENOSCOPY (EGD) WITH PROPOFOL  Patient location during evaluation: Phase II Anesthesia Type: General Level of consciousness: awake Pain management: pain level controlled Vital Signs Assessment: post-procedure vital signs reviewed and stable Respiratory status: spontaneous breathing and respiratory function stable Cardiovascular status: blood pressure returned to baseline and stable Postop Assessment: no headache and no apparent nausea or vomiting Anesthetic complications: no Comments: Late entry   No notable events documented.   Last Vitals:  Vitals:   10/10/22 1141 10/10/22 1143  BP: 100/66 103/66  Pulse: 86   Resp: (!) 24 18  Temp: 36.5 C   SpO2: 93% 94%    Last Pain:  Vitals:   10/10/22 1141  TempSrc: Oral  PainSc: 0-No pain                 Louann Sjogren

## 2022-10-17 ENCOUNTER — Encounter (HOSPITAL_COMMUNITY): Payer: Self-pay | Admitting: Internal Medicine

## 2022-11-17 ENCOUNTER — Encounter: Payer: Self-pay | Admitting: Family Medicine

## 2022-11-17 ENCOUNTER — Ambulatory Visit (INDEPENDENT_AMBULATORY_CARE_PROVIDER_SITE_OTHER): Payer: Medicare PPO | Admitting: Family Medicine

## 2022-11-17 VITALS — BP 127/81 | HR 100 | Ht 64.0 in | Wt 214.1 lb

## 2022-11-17 DIAGNOSIS — R1011 Right upper quadrant pain: Secondary | ICD-10-CM

## 2022-11-17 DIAGNOSIS — F418 Other specified anxiety disorders: Secondary | ICD-10-CM | POA: Diagnosis not present

## 2022-11-17 DIAGNOSIS — E559 Vitamin D deficiency, unspecified: Secondary | ICD-10-CM

## 2022-11-17 DIAGNOSIS — R7989 Other specified abnormal findings of blood chemistry: Secondary | ICD-10-CM

## 2022-11-17 DIAGNOSIS — R7303 Prediabetes: Secondary | ICD-10-CM | POA: Diagnosis not present

## 2022-11-17 DIAGNOSIS — I10 Essential (primary) hypertension: Secondary | ICD-10-CM

## 2022-11-17 DIAGNOSIS — E785 Hyperlipidemia, unspecified: Secondary | ICD-10-CM

## 2022-11-17 MED ORDER — PHENTERMINE HCL 15 MG PO CAPS: 15.0000 mg | ORAL_CAPSULE | ORAL | 3 refills | Status: DC

## 2022-11-17 NOTE — Patient Instructions (Addendum)
F/U in 3 months, call if you need me sooner   Phentermine   15 mg daily is new, weight loss goal of 6 pounds  [place] gall bladder will be scheduled  Labs today CBC TSH, cmp and eGFr,, HBA1C and vit D  It is important that you exercise regularly at least 30 minutes 5 times a week. If you develop chest pain, have severe difficulty breathing, or feel very tired, stop exercising immediately and seek medical attention    Thanks for choosing [facility], we consider it a privelige to serve you.

## 2022-11-18 LAB — CMP14+EGFR
ALT: 76 IU/L — ABNORMAL HIGH (ref 0–32)
AST: 36 IU/L (ref 0–40)
Albumin/Globulin Ratio: 1.4 (ref 1.2–2.2)
Albumin: 4.1 g/dL (ref 3.9–4.9)
Alkaline Phosphatase: 136 IU/L — ABNORMAL HIGH (ref 44–121)
BUN/Creatinine Ratio: 16 (ref 12–28)
BUN: 14 mg/dL (ref 8–27)
Bilirubin Total: 0.3 mg/dL (ref 0.0–1.2)
CO2: 24 mmol/L (ref 20–29)
Calcium: 9.3 mg/dL (ref 8.7–10.3)
Chloride: 104 mmol/L (ref 96–106)
Creatinine, Ser: 0.89 mg/dL (ref 0.57–1.00)
Globulin, Total: 2.9 g/dL (ref 1.5–4.5)
Glucose: 104 mg/dL — ABNORMAL HIGH (ref 70–99)
Potassium: 4.2 mmol/L (ref 3.5–5.2)
Sodium: 141 mmol/L (ref 134–144)
Total Protein: 7 g/dL (ref 6.0–8.5)
eGFR: 70 mL/min/{1.73_m2} (ref >59)

## 2022-11-18 LAB — CBC
Hematocrit: 37.4 % (ref 34.0–46.6)
Hemoglobin: 11.9 g/dL (ref 11.1–15.9)
MCH: 28.7 pg (ref 26.6–33.0)
MCHC: 31.8 g/dL (ref 31.5–35.7)
MCV: 90 fL (ref 79–97)
Platelets: 306 10*3/uL (ref 150–450)
RBC: 4.14 x10E6/uL (ref 3.77–5.28)
RDW: 13.7 % (ref 11.7–15.4)
WBC: 4.7 10*3/uL (ref 3.4–10.8)

## 2022-11-18 LAB — HEMOGLOBIN A1C
Est. average glucose Bld gHb Est-mCnc: 140 mg/dL
Hgb A1c MFr Bld: 6.5 % — ABNORMAL HIGH (ref 4.8–5.6)

## 2022-11-18 LAB — VITAMIN D 25 HYDROXY (VIT D DEFICIENCY, FRACTURES): Vit D, 25-Hydroxy: 58.1 ng/mL (ref 30.0–100.0)

## 2022-11-18 LAB — TSH: TSH: 1.24 u[IU]/mL (ref 0.450–4.500)

## 2022-11-19 DIAGNOSIS — R7989 Other specified abnormal findings of blood chemistry: Secondary | ICD-10-CM | POA: Insufficient documentation

## 2022-11-19 DIAGNOSIS — R1011 Right upper quadrant pain: Secondary | ICD-10-CM | POA: Insufficient documentation

## 2022-11-19 MED ORDER — AMLODIPINE BESYLATE 5 MG PO TABS: 5.0000 mg | ORAL_TABLET | Freq: Every day | ORAL | 3 refills | Status: DC

## 2022-11-19 NOTE — Progress Notes (Signed)
Alexis Hurst     MRN: [id number]      DOB: 1950/12/08   HPI Alexis Hurst is here for follow up C/o acute RUQ pain with nausea and vomit no diarrhea4 nights ago, no loose sttol, no fever , chill, flank pain or radiaition   ROS Denies recent fever or chills. Denies sinus pressure, nasal congestion, ear pain or sore throat. Denies chest congestion, productive cough or wheezing. Denies chest pains, palpitations and leg swelling    Denies dysuria, frequency, hesitancy or incontinence. Denies uncontrolled  joint pain, swelling and limitation in mobility. Denies headaches, seizures, numbness, or tingling. Denies uncontrolled  depression, anxiety or insomnia. Denies skin break down or rash.   PE  BP 127/81 (BP Location: Right Arm, Patient Position: Sitting, Cuff Size: Large)   Pulse 100   Ht 5\' 4"  (1.626 m)   Wt 214 lb 1.9 oz (97.1 kg)   SpO2 96%   BMI 36.75 kg/m   Patient alert and oriented and in no cardiopulmonary distress.  HEENT: No facial asymmetry, EOMI,     Neck supple .  Chest: Clear to auscultation bilaterally.  CVS: S1, S2 no murmurs, no S3.Regular rate.  ABD: Soft , RUQ tenderness, no guarding or rebound,no palpable mass or organomegaly, normal BS.   Ext: No edema  MS: Adequate ROM spine, shoulders, hips and knees.  Skin: Intact, no ulcerations or rash noted.  Psych: Good eye contact, normal affect. Memory intact not anxious or depressed appearing.  CNS: CN 2-12 intact, power,  normal throughout.no focal deficits noted.   Assessment & Plan  Essential hypertension Controlled, no change in medication DASH diet and commitment to daily physical activity for a minimum of 30 minutes discussed and encouraged, as a part of hypertension management. The importance of attaining a healthy weight is also discussed.     11/17/2022   10:11 AM 10/10/2022   11:43 AM 10/10/2022   11:41 AM 10/10/2022   10:23 AM 09/25/2022   12:56 PM 08/25/2022    9:22 AM 08/17/2022    10:03 AM  BP/Weight  Systolic BP AB-123456789 [id number] 123XX123 Q000111Q Q000111Q AB-123456789 123456  Diastolic BP 81 66 66 81 82 78 82  Wt. (Lbs) 214.12   210 216.4 217.8   BMI 36.75 kg/m2   36.05 kg/m2 37.14 kg/m2 37.39 kg/m2        Hyperlipidemia LDL goal <100 Hyperlipidemia:Low fat diet discussed and encouraged.   Lipid Panel  Lab Results  Component Value Date   CHOL 203 (H) 01/09/2022   HDL 58 01/09/2022   LDLCALC 129 (H) 01/09/2022   TRIG 90 01/09/2022   CHOLHDL 3.5 01/09/2022     Uncontrolled , needs to reduce fried and fatty food intake  Vitamin D deficiency Updated lab, cintrolled  Prediabetes Patient educated about the importance of limiting  Carbohydrate intake , the need to commit to daily physical activity for a minimum of 30 minutes , and to commit weight loss. The fact that changes in all these areas will reduce or eliminate all together the development of diabetes is stressed.  Needs to lower carb intake      Latest Ref Rng & Units 11/17/2022   11:03 AM 10/06/2022    2:27 PM 08/17/2022   10:25 AM 05/16/2022   11:52 AM 01/09/2022   11:04 AM  Diabetic Labs  HbA1c 4.8 - 5.6 % 6.5   6.3  6.1  5.8   Chol 100 - 199 mg/dL     203  HDL >39 mg/dL     58   Calc LDL 0 - 99 mg/dL     129   Triglycerides 0 - 149 mg/dL     90   Creatinine 0.57 - 1.00 mg/dL 0.89  0.97  0.95  0.88  0.95       11/17/2022   10:11 AM 10/10/2022   11:43 AM 10/10/2022   11:41 AM 10/10/2022   10:23 AM 09/25/2022   12:56 PM 08/25/2022    9:22 AM 08/17/2022   10:03 AM  BP/Weight  Systolic BP AB-123456789 [id number] 123XX123 Q000111Q Q000111Q AB-123456789 123456  Diastolic BP 81 66 66 81 82 78 82  Wt. (Lbs) 214.12   210 216.4 217.8   BMI 36.75 kg/m2   36.05 kg/m2 37.14 kg/m2 37.39 kg/m2       Latest Ref Rng & Units 01/18/2021   12:00 AM 01/19/2020   12:00 AM  Foot/eye exam completion dates  Eye Exam No Retinopathy No Retinopathy     Retinopathy         This result is from an external source.      Depression with anxiety Controlled, no change in  medication   Elevated LFTs Ruq [place] needed  Morbid obesity ([place])  Patient re-educated about  the importance of commitment to a  minimum of 150 minutes of exercise per week as able.  The importance of healthy food choices with portion control discussed, as well as eating regularly and within a 12 hour window most days. The need to choose "clean , green" food 50 to 75% of the time is discussed, as well as to make water the primary drink and set a goal of 64 ounces water daily.       11/17/2022   10:11 AM 10/10/2022   10:23 AM 09/25/2022   12:56 PM  Weight /BMI  Weight 214 lb 1.9 oz 210 lb 216 lb 6.4 oz  Height 5\' 4"  (1.626 m) 5\' 4"  (1.626 m) 5\' 4"  (1.626 m)  BMI 36.75 kg/m2 36.05 kg/m2 37.14 kg/m2    Start low dose phentermine  RUQ pain [place] to eval for gallstones

## 2022-11-19 NOTE — Assessment & Plan Note (Signed)
Hyperlipidemia:Low fat diet discussed and encouraged.   Lipid Panel  Lab Results  Component Value Date   CHOL 203 (H) 01/09/2022   HDL 58 01/09/2022   LDLCALC 129 (H) 01/09/2022   TRIG 90 01/09/2022   CHOLHDL 3.5 01/09/2022     Uncontrolled , needs to reduce fried and fatty food intake

## 2022-11-19 NOTE — Assessment & Plan Note (Signed)
Controlled, no change in medication DASH diet and commitment to daily physical activity for a minimum of 30 minutes discussed and encouraged, as a part of hypertension management. The importance of attaining a healthy weight is also discussed.     11/17/2022   10:11 AM 10/10/2022   11:43 AM 10/10/2022   11:41 AM 10/10/2022   10:23 AM 09/25/2022   12:56 PM 08/25/2022    9:22 AM 08/17/2022   10:03 AM  BP/Weight  Systolic BP AB-123456789 XX123456 123XX123 Q000111Q Q000111Q AB-123456789 123456  Diastolic BP 81 66 66 81 82 78 82  Wt. (Lbs) 214.12   210 216.4 217.8   BMI 36.75 kg/m2   36.05 kg/m2 37.14 kg/m2 37.39 kg/m2

## 2022-11-19 NOTE — Assessment & Plan Note (Signed)
  Patient re-educated about  the importance of commitment to a  minimum of 150 minutes of exercise per week as able.  The importance of healthy food choices with portion control discussed, as well as eating regularly and within a 12 hour window most days. The need to choose "clean , green" food 50 to 75% of the time is discussed, as well as to make water the primary drink and set a goal of 64 ounces water daily.       11/17/2022   10:11 AM 10/10/2022   10:23 AM 09/25/2022   12:56 PM  Weight /BMI  Weight 214 lb 1.9 oz 210 lb 216 lb 6.4 oz  Height 5\' 4"  (1.626 m) 5\' 4"  (1.626 m) 5\' 4"  (1.626 m)  BMI 36.75 kg/m2 36.05 kg/m2 37.14 kg/m2    Start low dose phentermine

## 2022-11-19 NOTE — Assessment & Plan Note (Signed)
Updated lab, cintrolled

## 2022-11-19 NOTE — Assessment & Plan Note (Signed)
Patient educated about the importance of limiting  Carbohydrate intake , the need to commit to daily physical activity for a minimum of 30 minutes , and to commit weight loss. The fact that changes in all these areas will reduce or eliminate all together the development of diabetes is stressed.  Needs to lower carb intake      Latest Ref Rng & Units 11/17/2022   11:03 AM 10/06/2022    2:27 PM 08/17/2022   10:25 AM 05/16/2022   11:52 AM 01/09/2022   11:04 AM  Diabetic Labs  HbA1c 4.8 - 5.6 % 6.5   6.3  6.1  5.8   Chol 100 - 199 mg/dL     203   HDL >39 mg/dL     58   Calc LDL 0 - 99 mg/dL     129   Triglycerides 0 - 149 mg/dL     90   Creatinine 0.57 - 1.00 mg/dL 0.89  0.97  0.95  0.88  0.95       11/17/2022   10:11 AM 10/10/2022   11:43 AM 10/10/2022   11:41 AM 10/10/2022   10:23 AM 09/25/2022   12:56 PM 08/25/2022    9:22 AM 08/17/2022   10:03 AM  BP/Weight  Systolic BP AB-123456789 [id number] 123XX123 Q000111Q Q000111Q AB-123456789 123456  Diastolic BP 81 66 66 81 82 78 82  Wt. (Lbs) 214.12   210 216.4 217.8   BMI 36.75 kg/m2   36.05 kg/m2 37.14 kg/m2 37.39 kg/m2       Latest Ref Rng & Units 01/18/2021   12:00 AM 01/19/2020   12:00 AM  Foot/eye exam completion dates  Eye Exam No Retinopathy No Retinopathy     Retinopathy         This result is from an external source.

## 2022-11-19 NOTE — Assessment & Plan Note (Signed)
Ruq [place] needed

## 2022-11-19 NOTE — Assessment & Plan Note (Signed)
Controlled, no change in medication  

## 2022-11-19 NOTE — Assessment & Plan Note (Signed)
[place] to eval for gallstones

## 2022-11-20 ENCOUNTER — Other Ambulatory Visit: Payer: Self-pay | Admitting: Family Medicine

## 2022-11-24 ENCOUNTER — Ambulatory Visit (HOSPITAL_COMMUNITY)
Admission: RE | Admit: 2022-11-24 | Discharge: 2022-11-24 | Disposition: A | Discharge status: Home / Self Care | Payer: Medicare PPO | Source: Ambulatory Visit | Attending: Family Medicine | Admitting: Family Medicine

## 2022-11-24 DIAGNOSIS — R7989 Other specified abnormal findings of blood chemistry: Secondary | ICD-10-CM | POA: Diagnosis not present

## 2022-11-24 DIAGNOSIS — R1011 Right upper quadrant pain: Secondary | ICD-10-CM | POA: Insufficient documentation

## 2022-11-24 DIAGNOSIS — R109 Unspecified abdominal pain: Secondary | ICD-10-CM | POA: Diagnosis not present

## 2022-11-24 DIAGNOSIS — K8020 Calculus of gallbladder without cholecystitis without obstruction: Secondary | ICD-10-CM | POA: Diagnosis not present

## 2022-11-24 DIAGNOSIS — R112 Nausea with vomiting, unspecified: Secondary | ICD-10-CM | POA: Diagnosis not present

## 2022-11-28 ENCOUNTER — Other Ambulatory Visit: Payer: Self-pay

## 2022-11-28 ENCOUNTER — Encounter: Payer: Self-pay | Admitting: Family Medicine

## 2022-11-28 DIAGNOSIS — R1011 Right upper quadrant pain: Secondary | ICD-10-CM

## 2022-11-28 DIAGNOSIS — K8020 Calculus of gallbladder without cholecystitis without obstruction: Secondary | ICD-10-CM

## 2022-12-19 ENCOUNTER — Ambulatory Visit: Payer: Medicare PPO | Admitting: General Surgery

## 2022-12-19 ENCOUNTER — Encounter: Payer: Self-pay | Admitting: General Surgery

## 2022-12-19 VITALS — BP 130/84 | HR 96 | Temp 98.4°F | Resp 14 | Ht 64.0 in | Wt 215.0 lb

## 2022-12-19 DIAGNOSIS — K8020 Calculus of gallbladder without cholecystitis without obstruction: Secondary | ICD-10-CM

## 2022-12-19 HISTORY — DX: Calculus of gallbladder without cholecystitis without obstruction: K80.20

## 2022-12-19 NOTE — Progress Notes (Signed)
[facility] History and Physical  Reason for Referral:*** Referring Physician: ***  Chief Complaint   Gallstones     Alexis Hurst is a 72 y.o. female.  HPI: ***.  The *** started *** and has had a duration of ***.  It is associated with ***.  The *** is improved with ***, and is made worse with ***.    Quality*** Context***  Past Medical History:  Diagnosis Date  . Allergic sinusitis 12/18/2012  . Allergic urticaria 04/02/2017  . Anxiety   . COVID-19 virus infection 09/05/2019  . Depression   . Diverticulosis 12/11/2013   Colonoscopy in 2014 per Dr Darrick Penna, also has small hemmorhoids   . Dizziness    Mild Orthostatic  . Essential hypertension   . Fatigue   . History of chest pain    Negative stress echo 2010  . Hyperlipidemia   . Insomnia   . Lab test positive for detection of COVID-19 virus 09/24/2019  . Mitral valve prolapse   . Non-allergic rhinitis 10/31/2017  . Obesity   . Palpitations    a. PAC's, PVC's and brief NSVT by prior monitor in 09/2020  . Paresthesias    Upper extremity /episode sensation of coldness and numbness   . Piles (hemorrhoids) 07/20/2015  . Raynauds phenomenon    Possible  . Swelling of arm    Swelling of left arm    Past Surgical History:  Procedure Laterality Date  . ABDOMINAL HYSTERECTOMY    . BREAST EXCISIONAL BIOPSY Left   . BREAST SURGERY    . CATARACT EXTRACTION Right 02/03/2020  . CATARACT EXTRACTION Left 02/09/2020  . COLONOSCOPY  06/11/2003   Smith:multiple medium scatered diverticula in the cecum, a sending colon, transverse colon, descending colon, sigmoid colon.  . COLONOSCOPY N/A 05/20/2013   TICS, SML IH  . COLONOSCOPY WITH PROPOFOL N/A 10/10/2022   Procedure: COLONOSCOPY WITH PROPOFOL;  Surgeon: Lanelle Bal, DO;  [facility]: [facility];  Service: Endoscopy;  Laterality: N/A;  11:30 am  . Cystectomy L breast, benign    . ESOPHAGOGASTRODUODENOSCOPY (EGD) WITH PROPOFOL N/A 10/10/2022    Procedure: ESOPHAGOGASTRODUODENOSCOPY (EGD) WITH PROPOFOL;  Surgeon: Lanelle Bal, DO;  Location: [facility];  Service: Endoscopy;  Laterality: N/A;  . VESICOVAGINAL FISTULA CLOSURE W/ TAH  1989    Family History  Problem Relation Age of Onset  . Hypertension Mother   . Urticaria Mother   . Heart attack Mother   . Heart failure Father   . Lung cancer Father   . Heart disease Father   . Asthma Sister   . Heart disease Sister   . Hypertension Brother   . Crohn's disease Brother   . Diabetes Brother   . Hypertension Sister   . Hypertension Sister   . Colon cancer Maternal Grandfather        Age greater than 60  . Breast cancer Daughter   . Allergic rhinitis Neg Hx   . Angioedema Neg Hx   . Atopy Neg Hx   . Eczema Neg Hx   . Immunodeficiency Neg Hx     Social History   Tobacco Use  . Smoking status: Former    Packs/day: 0.50    Years: 16.00    Additional pack years: 0.00    Total pack years: 8.00    Types: Cigarettes    Start date: 69    Quit date: 09/04/1986    Years since quitting: 36.3  . Smokeless tobacco: Never  Vaping Use  . Vaping Use: Never used  Substance Use Topics  . Alcohol use: Yes    Alcohol/week: 0.0 standard drinks of alcohol    Comment: occ  . Drug use: No    Medications: {medication reviewed/display:3041432} Allergies as of 12/19/2022       Reactions   Olmesartan Cough   Lactose Intolerance (gi) Diarrhea, Nausea And Vomiting        Medication List        Accurate as of December 19, 2022  2:18 PM. If you have any questions, ask your nurse or doctor.          acetaminophen 500 MG tablet Commonly known as: TYLENOL Take 500-1,000 mg by mouth every 6 (six) hours as needed (pain.).   albuterol 108 (90 Base) MCG/ACT inhaler Commonly known as: VENTOLIN HFA Inhale 2 puffs into the lungs every 6 (six) hours as needed for wheezing or shortness of breath.   amLODipine 5 MG tablet Commonly known as: NORVASC Take 1 tablet (5 mg  total) by mouth daily.   aspirin EC 81 MG tablet Take 81 mg by mouth at bedtime.   atorvastatin 10 MG tablet Commonly known as: LIPITOR Take 1 tablet (10 mg total) by mouth daily. What changed: when to take this   Calcium Carbonate-Vitamin D 600-200 MG-UNIT Tabs Take 1 tablet by mouth daily.   carvedilol 6.25 MG tablet Commonly known as: COREG TAKE 1 AND 1/2 TABLETS BY MOUTH TWICE DAILY WITH A MEAL   clobetasol ointment 0.05 % Commonly known as: TEMOVATE Apply bid to affected area for 2 weeks then 3 x a week What changed:  how much to take how to take this when to take this reasons to take this additional instructions   cloNIDine 0.2 MG tablet Commonly known as: Catapres Take 1 tablet (0.2 mg total) by mouth daily. What changed: when to take this   FLUoxetine 40 MG capsule Commonly known as: PROZAC TAKE 1 CAPSULE(40 MG) BY MOUTH DAILY   fluticasone 0.05 % cream Commonly known as: CUTIVATE Apply 1 Application topically 2 (two) times daily as needed (skin irrtation/rash (skin fold-under breast region)).   fluticasone 50 MCG/ACT nasal spray Commonly known as: FLONASE Place 2 sprays into both nostrils daily. What changed:  when to take this reasons to take this   ketoconazole 2 % cream Commonly known as: NIZORAL Apply 1 Application topically daily as needed for irritation (skin irrtation/rash (skin fold-under breast region)).   montelukast 10 MG tablet Commonly known as: SINGULAIR TAKE 1 TABLET(10 MG) BY MOUTH AT BEDTIME   Na Sulfate-K Sulfate-Mg Sulf 17.5-3.13-1.6 GM/177ML Soln As directed   pantoprazole 40 MG tablet Commonly known as: PROTONIX TAKE 1 TABLET(40 MG) BY MOUTH TWICE DAILY BEFORE A MEAL   phentermine 15 MG capsule Take 1 capsule (15 mg total) by mouth every morning.   Spiriva Respimat 1.25 MCG/ACT Aers Generic drug: Tiotropium Bromide Monohydrate Inhale 2 puffs into the lungs daily. What changed:  when to take this reasons to take this    spironolactone 25 MG tablet Commonly known as: ALDACTONE TAKE 1 TABLET(25 MG) BY MOUTH DAILY What changed: See the new instructions.   traZODone 100 MG tablet Commonly known as: DESYREL TAKE 1 TABLET(100 MG) BY MOUTH AT BEDTIME What changed: See the new instructions.   venlafaxine XR 75 MG 24 hr capsule Commonly known as: EFFEXOR-XR TAKE 1 CAPSULE(75 MG) BY MOUTH DAILY WITH BREAKFAST   Vitamin D (Ergocalciferol) 1.25 MG (50000 UNIT) Caps capsule Commonly  known as: DRISDOL TAKE 1 CAPSULE BY MOUTH 1 TIME A WEEK What changed:  how much to take how to take this when to take this additional instructions         ROS:  {Review of Systems:30496}  There were no vitals taken for this visit. Physical Exam  Results: CLINICAL DATA:  Right upper quadrant abdomen pain with nausea and vomiting.   EXAM: ULTRASOUND ABDOMEN LIMITED RIGHT UPPER QUADRANT   COMPARISON:  April 12, 2017   FINDINGS: Gallbladder:   Gallstones are identified, largest measures 8.4 mm. No wall thickening visualized. No sonographic Murphy sign noted by sonographer.   Common bile duct:   Diameter: 5.1 mm   Liver:   No focal lesion identified. Mild increased echotexture of the liver. Portal vein is patent on color Doppler imaging with normal direction of blood flow towards the liver.   Other: None.   IMPRESSION: 1. Cholelithiasis without sonographic evidence of acute cholecystitis. 2. Mild increased echotexture of the liver, nonspecific but can be seen in fatty infiltration.     Electronically Signed   By: Sherian Rein M.D.   On: 11/24/2022 09:25   Assessment & Plan:  Alexis Hurst is a 72 y.o. female with *** -*** -*** -Follow up ***  All questions were answered to the satisfaction of the patient and family***.  The risk and benefits of *** were discussed including but not limited to ***.  After careful consideration, Alexis Hurst has decided to ***.    Lucretia Roers 12/19/2022, 2:18 PM

## 2022-12-19 NOTE — Patient Instructions (Signed)
You have to be off phenteramine for 1 week and 1 day. If you did not take it 4/17 you can have surgery on or after 4/25.  Dates available - 4/26, 5/1, 5/3, 5/6  Minimally Invasive Cholecystectomy Minimally invasive cholecystectomy is surgery to remove the gallbladder. The gallbladder is a pear-shaped organ that lies beneath the liver on the right side of the body. The gallbladder stores bile, which is a fluid that helps the body digest fats. Cholecystectomy is often done to treat inflammation (irritation and swelling) of the gallbladder (cholecystitis). This condition is usually caused by a buildup of gallstones (cholelithiasis) in the gallbladder or when the fluid in the gall bladder becomes stagnant because gallstones get stuck in the ducts (tubes) and block the flow of bile. This can result in inflammation and pain. In severe cases, emergency surgery may be required. This procedure is done through small incisions in the abdomen, instead of one large incision. It is also called laparoscopic surgery. A thin scope with a camera (laparoscope) is inserted through one incision. Then surgical instruments are inserted through the other incisions. In some cases, a minimally invasive surgery may need to be changed to a surgery that is done through a larger incision. This is called open surgery. Tell a health care provider about: Any allergies you have. All medicines you are taking, including vitamins, herbs, eye drops, creams, and over-the-counter medicines. Any problems you or family members have had with anesthetic medicines. Any bleeding problems you have. Any surgeries you have had. Any medical conditions you have. Whether you are pregnant or may be pregnant. What are the risks? Generally, this is a safe procedure. However, problems may occur, including: Infection. Bleeding. Allergic reactions to medicines. Damage to nearby structures or organs. A gallstone remaining in the common bile duct. The  common bile duct carries bile from the gallbladder to the small intestine. A bile leak from the liver or cystic duct after your gallbladder is removed. What happens before the procedure? Medicines Ask your health care provider about: Changing or stopping your regular medicines. This is especially important if you are taking diabetes medicines or blood thinners. Taking medicines such as aspirin and ibuprofen. These medicines can thin your blood. Do not take these medicines unless your health care provider tells you to take them. Taking over-the-counter medicines, vitamins, herbs, and supplements. General instructions If you will be going home right after the procedure, plan to have a responsible adult: Take you home from the hospital or clinic. You will not be allowed to drive. Care for you for the time you are told. Do not use any products that contain nicotine or tobacco for at least 4 weeks before the procedure. These products include cigarettes, chewing tobacco, and vaping devices, such as e-cigarettes. If you need help quitting, ask your health care provider. Ask your health care provider: How your surgery site will be marked. What steps will be taken to help prevent infection. These may include: Removing hair at the surgery site. Washing skin with a germ-killing soap. Taking antibiotic medicine. What happens during the procedure?  An IV will be inserted into one of your veins. You will be given one or both of the following: A medicine to help you relax (sedative). A medicine to make you fall asleep (general anesthetic). Your surgeon will make several small incisions in your abdomen. The laparoscope will be inserted through one of the small incisions. The camera on the laparoscope will send images to a monitor in the  operating room. This lets your surgeon see inside your abdomen. A gas will be pumped into your abdomen. This will expand your abdomen to give the surgeon more room to  perform the surgery. Other tools that are needed for the procedure will be inserted through the other incisions. The gallbladder will be removed through one of the incisions. Your common bile duct may be examined. If stones are found in the common bile duct, they may be removed. After your gallbladder has been removed, the incisions will be closed with stitches (sutures), staples, or skin glue. Your incisions will be covered with a bandage (dressing). The procedure may vary among health care providers and hospitals. What happens after the procedure? Your blood pressure, heart rate, breathing rate, and blood oxygen level will be monitored until you leave the hospital or clinic. You will be given medicines as needed to control your pain. You may have a drain placed in the incision. The drain will be removed a day or two after the procedure. Summary Minimally invasive cholecystectomy, also called laparoscopic cholecystectomy, is surgery to remove the gallbladder using small incisions. Tell your health care provider about all the medical conditions you have and all the medicines you are taking for those conditions. Before the procedure, follow instructions about when to stop eating and drinking and changing or stopping medicines. Plan to have a responsible adult care for you for the time you are told after you leave the hospital or clinic. This information is not intended to replace advice given to you by your health care provider. Make sure you discuss any questions you have with your health care provider. Document Revised: 02/22/2021 Document Reviewed: 02/22/2021 Elsevier Patient Education  2023 [employer].

## 2022-12-27 ENCOUNTER — Other Ambulatory Visit: Payer: Self-pay | Admitting: Family Medicine

## 2023-01-07 NOTE — Progress Notes (Unsigned)
GI Office Note    Referring Provider: Kerri Perches, MD Primary Care Physician:  Kerri Perches, MD  Primary Gastroenterologist: Hennie Duos. Marletta Lor, DO   Chief Complaint   No chief complaint on file.   History of Present Illness   Alexis Hurst is a 72 y.o. female presenting today for follow-up.  She was seen in the office back in January for dysphagia.  She was also due for screening colonoscopy.    Recently seen by Dr. Henreitta Leber for gallbladder symptoms.  Cholecystectomy offered but patient going to think about it.  Labs from November 17, 2022: A1c 6.5, creatinine 0.89, sodium 141, potassium 4.2, albumin 4.1, total bilirubin 0.3, alkaline phosphatase 136, AST 36, ALT 76.  LFTs have been normal December 2023.  TSH 1.240, white blood cell count 4700, hemoglobin 11.9, platelets 306,000.  Right upper quadrant ultrasound March 2024: Cholelithiasis without acute cholecystitis, mild fatty liver.  EGD February 2024:  -Zenker's diverticulum noted in the posterior oropharynx -2 cm hiatal hernia -Z-line regular, 36 cm from the incisors -Normal stomach -Normal duodenal bulb, first portion of the duodenum and second portion duodenum  Colonoscopy February 2024: -Diverticulosis in the sigmoid colon, descending colon, transverse colon -Repeat colonoscopy in 10 years for screening purposes if benefits outweigh risk  Medications   Current Outpatient Medications  Medication Sig Dispense Refill   acetaminophen (TYLENOL) 500 MG tablet Take 500-1,000 mg by mouth every 6 (six) hours as needed (pain.).     albuterol (VENTOLIN HFA) 108 (90 Base) MCG/ACT inhaler Inhale 2 puffs into the lungs every 6 (six) hours as needed for wheezing or shortness of breath. 8 g 6   amLODipine (NORVASC) 5 MG tablet TAKE 1 TABLET(5 MG) BY MOUTH DAILY 90 tablet 3   aspirin 81 MG EC tablet Take 81 mg by mouth at bedtime.     atorvastatin (LIPITOR) 10 MG tablet Take 1 tablet (10 mg total) by mouth  daily. (Patient taking differently: Take 10 mg by mouth at bedtime.) 90 tablet 3   Calcium Carbonate-Vitamin D 600-200 MG-UNIT TABS Take 1 tablet by mouth daily.     carvedilol (COREG) 6.25 MG tablet TAKE 1 AND 1/2 TABLETS BY MOUTH TWICE DAILY WITH A MEAL 270 tablet 2   clobetasol ointment (TEMOVATE) 0.05 % Apply bid to affected area for 2 weeks then 3 x a week (Patient taking differently: Apply 1 Application topically daily as needed (skin irritation.).) 45 g 3   cloNIDine (CATAPRES) 0.2 MG tablet Take 1 tablet (0.2 mg total) by mouth daily. (Patient taking differently: Take 0.2 mg by mouth at bedtime.) 90 tablet 3   FLUoxetine (PROZAC) 40 MG capsule TAKE 1 CAPSULE(40 MG) BY MOUTH DAILY 90 capsule 3   fluticasone (CUTIVATE) 0.05 % cream Apply 1 Application topically 2 (two) times daily as needed (skin irrtation/rash (skin fold-under breast region)).     fluticasone (FLONASE) 50 MCG/ACT nasal spray Place 2 sprays into both nostrils daily. (Patient taking differently: Place 2 sprays into both nostrils daily as needed for allergies.) 16 g 6   ketoconazole (NIZORAL) 2 % cream Apply 1 Application topically daily as needed for irritation (skin irrtation/rash (skin fold-under breast region)).     montelukast (SINGULAIR) 10 MG tablet TAKE 1 TABLET(10 MG) BY MOUTH AT BEDTIME 90 tablet 1   Na Sulfate-K Sulfate-Mg Sulf 17.5-3.13-1.6 GM/177ML SOLN As directed 354 mL 0   pantoprazole (PROTONIX) 40 MG tablet TAKE 1 TABLET(40 MG) BY MOUTH TWICE DAILY BEFORE A MEAL  180 tablet 1   phentermine 15 MG capsule Take 1 capsule (15 mg total) by mouth every morning. 30 capsule 3   spironolactone (ALDACTONE) 25 MG tablet TAKE 1 TABLET(25 MG) BY MOUTH DAILY (Patient taking differently: Take 25 mg by mouth at bedtime.) 90 tablet 3   Tiotropium Bromide Monohydrate (SPIRIVA RESPIMAT) 1.25 MCG/ACT AERS Inhale 2 puffs into the lungs daily. (Patient taking differently: Inhale 2 puffs into the lungs daily as needed (respiratory  issues.).) 4 g 0   traZODone (DESYREL) 100 MG tablet TAKE 1 TABLET(100 MG) BY MOUTH AT BEDTIME (Patient taking differently: Take 100 mg by mouth at bedtime as needed for sleep.) 90 tablet 1   venlafaxine XR (EFFEXOR-XR) 75 MG 24 hr capsule TAKE 1 CAPSULE(75 MG) BY MOUTH DAILY WITH BREAKFAST 90 capsule 1   Vitamin D, Ergocalciferol, (DRISDOL) 1.25 MG (50000 UNIT) CAPS capsule TAKE 1 CAPSULE BY MOUTH 1 TIME A WEEK (Patient taking differently: Take 50,000 Units by mouth every Thursday.) 12 capsule 1   No current facility-administered medications for this visit.    Allergies   Allergies as of 01/08/2023 - Review Complete 12/19/2022  Allergen Reaction Noted   Olmesartan Cough 02/05/2019   Lactose intolerance (gi) Diarrhea and Nausea And Vomiting 05/07/2013     Past Medical History   Past Medical History:  Diagnosis Date   Allergic sinusitis 12/18/2012   Allergic urticaria 04/02/2017   Anxiety    COVID-19 virus infection 09/05/2019   Depression    Diverticulosis 12/11/2013   Colonoscopy in 2014 per Dr Darrick Penna, also has small hemmorhoids    Dizziness    Mild Orthostatic   Essential hypertension    Fatigue    History of chest pain    Negative stress echo 2010   Hyperlipidemia    Insomnia    Lab test positive for detection of COVID-19 virus 09/24/2019   Mitral valve prolapse    Non-allergic rhinitis 10/31/2017   Obesity    Palpitations    a. PAC's, PVC's and brief NSVT by prior monitor in 09/2020   Paresthesias    Upper extremity /episode sensation of coldness and numbness    Piles (hemorrhoids) 07/20/2015   Raynauds phenomenon    Possible   Swelling of arm    Swelling of left arm    Past Surgical History   Past Surgical History:  Procedure Laterality Date   ABDOMINAL HYSTERECTOMY     BREAST EXCISIONAL BIOPSY Left    BREAST SURGERY     CATARACT EXTRACTION Right 02/03/2020   CATARACT EXTRACTION Left 02/09/2020   COLONOSCOPY  06/11/2003   Smith:multiple medium scatered  diverticula in the cecum, a sending colon, transverse colon, descending colon, sigmoid colon.   COLONOSCOPY N/A 05/20/2013   TICS, SML IH   COLONOSCOPY WITH PROPOFOL N/A 10/10/2022   Procedure: COLONOSCOPY WITH PROPOFOL;  Surgeon: Lanelle Bal, DO;  Location: [facility];  Service: Endoscopy;  Laterality: N/A;  11:30 am   Cystectomy L breast, benign     ESOPHAGOGASTRODUODENOSCOPY (EGD) WITH PROPOFOL N/A 10/10/2022   Procedure: ESOPHAGOGASTRODUODENOSCOPY (EGD) WITH PROPOFOL;  Surgeon: Lanelle Bal, DO;  Location: [facility];  Service: Endoscopy;  Laterality: N/A;   VESICOVAGINAL FISTULA CLOSURE W/ TAH  1989    Past Family History   Family History  Problem Relation Age of Onset   Hypertension Mother    Urticaria Mother    Heart attack Mother    Heart failure Father    Lung cancer Father  Heart disease Father    Asthma Sister    Heart disease Sister    Hypertension Brother    Crohn's disease Brother    Diabetes Brother    Hypertension Sister    Hypertension Sister    Colon cancer Maternal Grandfather        Age greater than 64   Breast cancer Daughter    Allergic rhinitis Neg Hx    Angioedema Neg Hx    Atopy Neg Hx    Eczema Neg Hx    Immunodeficiency Neg Hx     Past Social History   Social History   Socioeconomic History   Marital status: Married    Spouse name: Alvino   Number of children: 2   Years of education: Not on file   Highest education level: Not on file  Occupational History   Occupation: Runner, broadcasting/film/video   Tobacco Use   Smoking status: Former    Packs/day: 0.50    Years: 16.00    Additional pack years: 0.00    Total pack years: 8.00    Types: Cigarettes    Start date: 1    Quit date: 09/04/1986    Years since quitting: 36.3   Smokeless tobacco: Never  Vaping Use   Vaping Use: Never used  Substance and Sexual Activity   Alcohol use: Yes    Alcohol/week: 0.0 standard drinks of alcohol    Comment: occ   Drug use: No   Sexual activity: Yes     Birth control/protection: Surgical    Comment: hyst  Other Topics Concern   Not on file  Social History Narrative   1 son, 1 daughter   9 grandchildren.   Social Determinants of Health   Financial Resource Strain: Low Risk  (06/29/2021)   Overall Financial Resource Strain (CARDIA)    Difficulty of Paying Living Expenses: Not hard at all  Food Insecurity: No Food Insecurity (06/29/2021)   Hunger Vital Sign    Worried About Running Out of Food in the Last Year: Never true    Ran Out of Food in the Last Year: Never true  Transportation Needs: No Transportation Needs (06/29/2021)   PRAPARE - Administrator, Civil Service (Medical): No    Lack of Transportation (Non-Medical): No  Physical Activity: Sufficiently Active (06/29/2021)   Exercise Vital Sign    Days of Exercise per Week: 5 days    Minutes of Exercise per Session: 30 min  Stress: No Stress Concern Present (06/29/2021)   [employer] of Occupational Health - Occupational Stress Questionnaire    Feeling of Stress : Only a little  Social Connections: Socially Integrated (06/29/2021)   Social Connection and Isolation Panel [NHANES]    Frequency of Communication with Friends and Family: More than three times a week    Frequency of Social Gatherings with Friends and Family: More than three times a week    Attends Religious Services: More than 4 times per year    Active Member of [employer] or Organizations: Yes    Attends Engineer, structural: More than 4 times per year    Marital Status: Married  Catering manager Violence: Not At Risk (06/29/2021)   Humiliation, Afraid, Rape, and Kick questionnaire    Fear of Current or Ex-Partner: No    Emotionally Abused: No    Physically Abused: No    Sexually Abused: No    Review of Systems   General: Negative for anorexia, weight loss, fever, chills, fatigue, weakness.  ENT: Negative for hoarseness, difficulty swallowing , nasal congestion. CV: Negative for  chest pain, angina, palpitations, dyspnea on exertion, peripheral edema.  Respiratory: Negative for dyspnea at rest, dyspnea on exertion, cough, sputum, wheezing.  GI: See history of present illness. GU:  Negative for dysuria, hematuria, urinary incontinence, urinary frequency, nocturnal urination.  Endo: Negative for unusual weight change.     Physical Exam   There were no vitals taken for this visit.   General: Well-nourished, well-developed in no acute distress.  Eyes: No icterus. Mouth: Oropharyngeal mucosa moist and pink , no lesions erythema or exudate. Lungs: Clear to auscultation bilaterally.  Heart: Regular rate and rhythm, no murmurs rubs or gallops.  Abdomen: Bowel sounds are normal, nontender, nondistended, no hepatosplenomegaly or masses,  no abdominal bruits or hernia , no rebound or guarding.  Rectal: ***  Extremities: No lower extremity edema. No clubbing or deformities. Neuro: Alert and oriented x 4   Skin: Warm and dry, no jaundice.   Psych: Alert and cooperative, normal mood and affect.  Labs   *** Imaging Studies   No results found.  Assessment       PLAN   ***   Leanna Battles. Melvyn Neth, MHS, PA-C [facility]

## 2023-01-08 ENCOUNTER — Ambulatory Visit (INDEPENDENT_AMBULATORY_CARE_PROVIDER_SITE_OTHER): Payer: Medicare PPO | Admitting: Gastroenterology

## 2023-01-08 ENCOUNTER — Other Ambulatory Visit (HOSPITAL_COMMUNITY)
Admission: RE | Admit: 2023-01-08 | Discharge: 2023-01-08 | Disposition: A | Discharge status: Home / Self Care | Payer: Medicare PPO | Source: Ambulatory Visit | Attending: Cardiology | Admitting: Cardiology

## 2023-01-08 ENCOUNTER — Encounter: Payer: Self-pay | Admitting: Gastroenterology

## 2023-01-08 VITALS — BP 123/85 | HR 102 | Temp 98.3°F | Ht 64.0 in | Wt 215.0 lb

## 2023-01-08 DIAGNOSIS — R7989 Other specified abnormal findings of blood chemistry: Secondary | ICD-10-CM

## 2023-01-08 DIAGNOSIS — K219 Gastro-esophageal reflux disease without esophagitis: Secondary | ICD-10-CM | POA: Diagnosis not present

## 2023-01-08 DIAGNOSIS — E782 Mixed hyperlipidemia: Secondary | ICD-10-CM | POA: Insufficient documentation

## 2023-01-08 DIAGNOSIS — K225 Diverticulum of esophagus, acquired: Secondary | ICD-10-CM | POA: Diagnosis not present

## 2023-01-08 LAB — LIPID PANEL
Cholesterol: 156 mg/dL (ref 0–200)
HDL: 65 mg/dL (ref >40)
LDL Cholesterol: 72 mg/dL (ref 0–99)
Total CHOL/HDL Ratio: 2.4 RATIO
Triglycerides: 95 mg/dL (ref <150)
VLDL: 19 mg/dL (ref 0–40)

## 2023-01-08 NOTE — Patient Instructions (Addendum)
You have a Zenker's diverticulum, which is a little pocket in the upper esophagus. This is likely giving you the sensation that food or pills are not going down well at times or even resulting in you coughing back up food/pills. This can be surgically repaired if symptoms worsen.  Your cough could be due to reflux, Zenker's, post nasal drip, or other. Try elevated the head of your bed about 20 degrees so that fluid in your esophagus can drain downward. This may help your cough. If no improvement after four weeks, then you can cut back on your pantoprazole to once daily.  Continue to consume a lot of fiber to regulate stool and prevent diverticulitis.  Try a good probiotic for 1-2 months to see if this helps bloating/gas. Align, Restora, Philips colon health are good options.  Your liver labs are up periodically and you have mild fatty liver on ultrasound. I suspect the recent bump in your numbers were more due to your gallstones/gallbladder. Continue to have your numbers checked by Dr. Lodema Hong, once to twice per year. If remain elevated, please let us know.  Return to the office in six months, we will follow up on your fatty liver at that time.

## 2023-01-22 DIAGNOSIS — H26493 Other secondary cataract, bilateral: Secondary | ICD-10-CM | POA: Diagnosis not present

## 2023-01-22 DIAGNOSIS — H524 Presbyopia: Secondary | ICD-10-CM | POA: Diagnosis not present

## 2023-01-22 DIAGNOSIS — H02834 Dermatochalasis of left upper eyelid: Secondary | ICD-10-CM | POA: Diagnosis not present

## 2023-01-22 DIAGNOSIS — E119 Type 2 diabetes mellitus without complications: Secondary | ICD-10-CM | POA: Diagnosis not present

## 2023-01-22 DIAGNOSIS — H02831 Dermatochalasis of right upper eyelid: Secondary | ICD-10-CM | POA: Diagnosis not present

## 2023-01-22 LAB — HM DIABETES EYE EXAM

## 2023-01-30 ENCOUNTER — Other Ambulatory Visit (HOSPITAL_COMMUNITY)
Admission: RE | Admit: 2023-01-30 | Discharge: 2023-01-30 | Disposition: A | Discharge status: Home / Self Care | Payer: Medicare PPO | Source: Ambulatory Visit | Attending: General Surgery | Admitting: General Surgery

## 2023-01-30 ENCOUNTER — Encounter (HOSPITAL_COMMUNITY)
Admission: RE | Admit: 2023-01-30 | Discharge: 2023-01-30 | Disposition: A | Discharge status: Home / Self Care | Payer: Medicare PPO | Source: Ambulatory Visit | Attending: General Surgery | Admitting: General Surgery

## 2023-01-30 ENCOUNTER — Encounter (HOSPITAL_COMMUNITY): Payer: Self-pay

## 2023-01-30 VITALS — Ht 64.0 in | Wt 215.0 lb

## 2023-01-30 DIAGNOSIS — E0800 Diabetes mellitus due to underlying condition with hyperosmolarity without nonketotic hyperglycemic-hyperosmolar coma (NKHHC): Secondary | ICD-10-CM | POA: Diagnosis not present

## 2023-01-30 DIAGNOSIS — Z01812 Encounter for preprocedural laboratory examination: Secondary | ICD-10-CM | POA: Insufficient documentation

## 2023-01-30 DIAGNOSIS — R0683 Snoring: Secondary | ICD-10-CM

## 2023-01-30 DIAGNOSIS — E782 Mixed hyperlipidemia: Secondary | ICD-10-CM | POA: Insufficient documentation

## 2023-01-30 HISTORY — DX: Type 2 diabetes mellitus without complications: E11.9

## 2023-01-30 LAB — LIPID PANEL
Cholesterol: 170 mg/dL (ref 0–200)
HDL: 76 mg/dL (ref >40)
LDL Cholesterol: 76 mg/dL (ref 0–99)
Total CHOL/HDL Ratio: 2.2 RATIO
Triglycerides: 92 mg/dL (ref <150)
VLDL: 18 mg/dL (ref 0–40)

## 2023-02-01 ENCOUNTER — Ambulatory Visit (HOSPITAL_BASED_OUTPATIENT_CLINIC_OR_DEPARTMENT_OTHER): Payer: Medicare PPO | Admitting: Certified Registered"

## 2023-02-01 ENCOUNTER — Encounter (HOSPITAL_COMMUNITY): Payer: Self-pay | Admitting: General Surgery

## 2023-02-01 ENCOUNTER — Ambulatory Visit (HOSPITAL_COMMUNITY)
Admission: RE | Admit: 2023-02-01 | Discharge: 2023-02-01 | Disposition: A | Discharge status: Home / Self Care | Payer: Medicare PPO | Attending: General Surgery | Admitting: General Surgery

## 2023-02-01 ENCOUNTER — Other Ambulatory Visit: Payer: Self-pay

## 2023-02-01 ENCOUNTER — Encounter (HOSPITAL_COMMUNITY)
Admission: RE | Disposition: A | Discharge status: Home / Self Care | Payer: Self-pay | Source: Home / Self Care | Attending: General Surgery

## 2023-02-01 ENCOUNTER — Ambulatory Visit (HOSPITAL_COMMUNITY): Payer: Medicare PPO | Admitting: Certified Registered"

## 2023-02-01 DIAGNOSIS — F419 Anxiety disorder, unspecified: Secondary | ICD-10-CM | POA: Insufficient documentation

## 2023-02-01 DIAGNOSIS — I10 Essential (primary) hypertension: Secondary | ICD-10-CM

## 2023-02-01 DIAGNOSIS — K8010 Calculus of gallbladder with chronic cholecystitis without obstruction: Secondary | ICD-10-CM | POA: Diagnosis not present

## 2023-02-01 DIAGNOSIS — Z87891 Personal history of nicotine dependence: Secondary | ICD-10-CM | POA: Insufficient documentation

## 2023-02-01 DIAGNOSIS — E119 Type 2 diabetes mellitus without complications: Secondary | ICD-10-CM

## 2023-02-01 DIAGNOSIS — K8020 Calculus of gallbladder without cholecystitis without obstruction: Secondary | ICD-10-CM | POA: Diagnosis not present

## 2023-02-01 DIAGNOSIS — E785 Hyperlipidemia, unspecified: Secondary | ICD-10-CM | POA: Diagnosis not present

## 2023-02-01 DIAGNOSIS — F32A Depression, unspecified: Secondary | ICD-10-CM | POA: Insufficient documentation

## 2023-02-01 DIAGNOSIS — K219 Gastro-esophageal reflux disease without esophagitis: Secondary | ICD-10-CM | POA: Insufficient documentation

## 2023-02-01 LAB — GLUCOSE, CAPILLARY
Glucose-Capillary: 106 mg/dL — ABNORMAL HIGH (ref 70–99)
Glucose-Capillary: 158 mg/dL — ABNORMAL HIGH (ref 70–99)

## 2023-02-01 LAB — HEMOGLOBIN A1C
Hgb A1c MFr Bld: 6.4 % — ABNORMAL HIGH (ref 4.8–5.6)
Mean Plasma Glucose: 137 mg/dL

## 2023-02-01 SURGERY — CHOLECYSTECTOMY, ROBOT-ASSISTED, LAPAROSCOPIC
Anesthesia: General | Site: Abdomen

## 2023-02-01 MED ORDER — KETOROLAC TROMETHAMINE 30 MG/ML IJ SOLN
30.0000 mg | Freq: Once | INTRAMUSCULAR | Status: AC
  Administered 2023-02-01: 30 mg via INTRAVENOUS

## 2023-02-01 MED ORDER — FENTANYL CITRATE (PF) 250 MCG/5ML IJ SOLN
INTRAMUSCULAR | Status: DC | PRN
  Administered 2023-02-01: 100 ug via INTRAVENOUS

## 2023-02-01 MED ORDER — ORAL CARE MOUTH RINSE: 15.0000 mL | Freq: Once | OROMUCOSAL | Status: AC

## 2023-02-01 MED ORDER — MIDAZOLAM HCL 2 MG/2ML IJ SOLN
INTRAMUSCULAR | Status: DC | PRN
  Administered 2023-02-01: 2 mg via INTRAVENOUS

## 2023-02-01 MED ORDER — FENTANYL CITRATE PF 50 MCG/ML IJ SOSY
25.0000 ug | PREFILLED_SYRINGE | INTRAMUSCULAR | Status: DC | PRN
  Administered 2023-02-01: 50 ug via INTRAVENOUS
  Filled 2023-02-01: qty 1

## 2023-02-01 MED ORDER — ONDANSETRON HCL 4 MG/2ML IJ SOLN: 4.0000 mg | Freq: Once | INTRAMUSCULAR | Status: DC | PRN

## 2023-02-01 MED ORDER — LIDOCAINE 2% (20 MG/ML) 5 ML SYRINGE
INTRAMUSCULAR | Status: DC | PRN
  Administered 2023-02-01: 100 mg via INTRAVENOUS

## 2023-02-01 MED ORDER — OXYCODONE HCL 5 MG PO TABS: 5.0000 mg | ORAL_TABLET | ORAL | 0 refills | Status: DC | PRN

## 2023-02-01 MED ORDER — LACTATED RINGERS IV SOLN: INTRAVENOUS | Status: DC

## 2023-02-01 MED ORDER — LIDOCAINE HCL (PF) 2 % IJ SOLN
INTRAMUSCULAR | Status: AC
  Filled 2023-02-01: qty 5

## 2023-02-01 MED ORDER — STERILE WATER FOR IRRIGATION IR SOLN
Status: DC | PRN
  Administered 2023-02-01: 500 mL

## 2023-02-01 MED ORDER — DEXAMETHASONE SODIUM PHOSPHATE 10 MG/ML IJ SOLN
INTRAMUSCULAR | Status: AC
  Filled 2023-02-01: qty 1

## 2023-02-01 MED ORDER — SODIUM CHLORIDE 0.9 % IV SOLN
2.0000 g | INTRAVENOUS | Status: AC
  Administered 2023-02-01: 2 g via INTRAVENOUS

## 2023-02-01 MED ORDER — PHENYLEPHRINE 80 MCG/ML (10ML) SYRINGE FOR IV PUSH (FOR BLOOD PRESSURE SUPPORT)
PREFILLED_SYRINGE | INTRAVENOUS | Status: AC
  Filled 2023-02-01: qty 10

## 2023-02-01 MED ORDER — INDOCYANINE GREEN 25 MG IV SOLR
INTRAVENOUS | Status: AC
  Administered 2023-02-01: 2.5 mg via INTRAVENOUS
  Filled 2023-02-01: qty 10

## 2023-02-01 MED ORDER — OXYCODONE HCL 5 MG/5ML PO SOLN: 5.0000 mg | Freq: Once | ORAL | Status: DC | PRN

## 2023-02-01 MED ORDER — OXYCODONE HCL 5 MG PO TABS: 5.0000 mg | ORAL_TABLET | Freq: Once | ORAL | Status: DC | PRN

## 2023-02-01 MED ORDER — PROPOFOL 10 MG/ML IV BOLUS
INTRAVENOUS | Status: AC
  Filled 2023-02-01: qty 20

## 2023-02-01 MED ORDER — GLYCOPYRROLATE PF 0.2 MG/ML IJ SOSY
PREFILLED_SYRINGE | INTRAMUSCULAR | Status: DC | PRN
  Administered 2023-02-01 (×2): .1 mg via INTRAVENOUS

## 2023-02-01 MED ORDER — SCOPOLAMINE 1 MG/3DAYS TD PT72
1.0000 | MEDICATED_PATCH | Freq: Once | TRANSDERMAL | Status: DC
  Administered 2023-02-01: 1.5 mg via TRANSDERMAL

## 2023-02-01 MED ORDER — MIDAZOLAM HCL 2 MG/2ML IJ SOLN
INTRAMUSCULAR | Status: AC
  Filled 2023-02-01: qty 2

## 2023-02-01 MED ORDER — FENTANYL CITRATE (PF) 250 MCG/5ML IJ SOLN
INTRAMUSCULAR | Status: AC
  Filled 2023-02-01: qty 5

## 2023-02-01 MED ORDER — ONDANSETRON HCL 4 MG/2ML IJ SOLN
INTRAMUSCULAR | Status: DC | PRN
  Administered 2023-02-01: 4 mg via INTRAVENOUS

## 2023-02-01 MED ORDER — BUPIVACAINE HCL (PF) 0.5 % IJ SOLN
INTRAMUSCULAR | Status: AC
  Filled 2023-02-01: qty 30

## 2023-02-01 MED ORDER — INDOCYANINE GREEN 25 MG IV SOLR: 2.5000 mg | Freq: Once | INTRAVENOUS | Status: AC

## 2023-02-01 MED ORDER — BUPIVACAINE HCL (PF) 0.5 % IJ SOLN
INTRAMUSCULAR | Status: DC | PRN
  Administered 2023-02-01: 30 mL

## 2023-02-01 MED ORDER — ONDANSETRON HCL 4 MG/2ML IJ SOLN
INTRAMUSCULAR | Status: AC
  Filled 2023-02-01: qty 2

## 2023-02-01 MED ORDER — SUCCINYLCHOLINE CHLORIDE 200 MG/10ML IV SOSY
PREFILLED_SYRINGE | INTRAVENOUS | Status: DC | PRN
  Administered 2023-02-01: 120 mg via INTRAVENOUS

## 2023-02-01 MED ORDER — CHLORHEXIDINE GLUCONATE 0.12 % MT SOLN
15.0000 mL | Freq: Once | OROMUCOSAL | Status: AC
  Administered 2023-02-01: 15 mL via OROMUCOSAL

## 2023-02-01 MED ORDER — SCOPOLAMINE 1 MG/3DAYS TD PT72
MEDICATED_PATCH | TRANSDERMAL | Status: AC
  Filled 2023-02-01: qty 1

## 2023-02-01 MED ORDER — GLYCOPYRROLATE PF 0.2 MG/ML IJ SOSY
PREFILLED_SYRINGE | INTRAMUSCULAR | Status: AC
  Filled 2023-02-01: qty 1

## 2023-02-01 MED ORDER — CHLORHEXIDINE GLUCONATE CLOTH 2 % EX PADS: 6.0000 | MEDICATED_PAD | Freq: Once | CUTANEOUS | Status: DC

## 2023-02-01 MED ORDER — SODIUM CHLORIDE FLUSH 0.9 % IV SOLN
INTRAVENOUS | Status: AC
  Filled 2023-02-01: qty 10

## 2023-02-01 MED ORDER — ROCURONIUM BROMIDE 10 MG/ML (PF) SYRINGE
PREFILLED_SYRINGE | INTRAVENOUS | Status: AC
  Filled 2023-02-01: qty 10

## 2023-02-01 MED ORDER — SUGAMMADEX SODIUM 200 MG/2ML IV SOLN
INTRAVENOUS | Status: DC | PRN
  Administered 2023-02-01: 100 mg via INTRAVENOUS
  Administered 2023-02-01: 200 mg via INTRAVENOUS

## 2023-02-01 MED ORDER — SODIUM CHLORIDE 0.9 % IV SOLN
INTRAVENOUS | Status: AC
  Filled 2023-02-01: qty 2

## 2023-02-01 MED ORDER — PROPOFOL 10 MG/ML IV BOLUS
INTRAVENOUS | Status: DC | PRN
  Administered 2023-02-01: 100 mg via INTRAVENOUS

## 2023-02-01 MED ORDER — DEXAMETHASONE SODIUM PHOSPHATE 10 MG/ML IJ SOLN
INTRAMUSCULAR | Status: DC | PRN
  Administered 2023-02-01: 10 mg via INTRAVENOUS

## 2023-02-01 MED ORDER — ROCURONIUM BROMIDE 10 MG/ML (PF) SYRINGE
PREFILLED_SYRINGE | INTRAVENOUS | Status: DC | PRN
  Administered 2023-02-01: 70 mg via INTRAVENOUS

## 2023-02-01 MED ORDER — KETOROLAC TROMETHAMINE 30 MG/ML IJ SOLN
INTRAMUSCULAR | Status: AC
  Filled 2023-02-01: qty 1

## 2023-02-01 MED ORDER — ONDANSETRON HCL 4 MG PO TABS: 4.0000 mg | ORAL_TABLET | Freq: Three times a day (TID) | ORAL | 1 refills | Status: DC | PRN

## 2023-02-01 MED ORDER — PHENYLEPHRINE 80 MCG/ML (10ML) SYRINGE FOR IV PUSH (FOR BLOOD PRESSURE SUPPORT)
PREFILLED_SYRINGE | INTRAVENOUS | Status: DC | PRN
  Administered 2023-02-01 (×2): 80 ug via INTRAVENOUS

## 2023-02-01 SURGICAL SUPPLY — 46 items
ADH SKN CLS APL DERMABOND .7 (GAUZE/BANDAGES/DRESSINGS) ×1
APL PRP STRL LF DISP 70% ISPRP (MISCELLANEOUS) ×1
BLADE SURG 15 STRL LF DISP TIS (BLADE) ×1 IMPLANT
BLADE SURG 15 STRL SS (BLADE) ×1
CAUTERY HOOK MNPLR 1.6 DVNC XI (INSTRUMENTS) ×1 IMPLANT
CHLORAPREP W/TINT 26 (MISCELLANEOUS) ×1 IMPLANT
CLIP LIGATING HEM O LOK PURPLE (MISCELLANEOUS) ×1 IMPLANT
COVER LIGHT HANDLE STERIS (MISCELLANEOUS) ×2 IMPLANT
COVER TIP SHEARS 8 DVNC (MISCELLANEOUS) ×1 IMPLANT
DEFOGGER SCOPE WARMER CLEARIFY (MISCELLANEOUS) IMPLANT
DERMABOND ADVANCED .7 DNX12 (GAUZE/BANDAGES/DRESSINGS) ×1 IMPLANT
DRAPE ARM DVNC X/XI (DISPOSABLE) ×4 IMPLANT
DRAPE COLUMN DVNC XI (DISPOSABLE) ×1 IMPLANT
ELECT REM PT RETURN 9FT ADLT (ELECTROSURGICAL) ×1
ELECTRODE REM PT RTRN 9FT ADLT (ELECTROSURGICAL) ×1 IMPLANT
FORCEPS BPLR R/ABLATION 8 DVNC (INSTRUMENTS) ×1 IMPLANT
FORCEPS PROGRASP DVNC XI (FORCEP) ×1 IMPLANT
GLOVE BIO SURGEON STRL SZ 6.5 (GLOVE) ×2 IMPLANT
GLOVE BIO SURGEON STRL SZ7 (GLOVE) IMPLANT
GLOVE BIOGEL PI IND STRL 6.5 (GLOVE) ×2 IMPLANT
GLOVE BIOGEL PI IND STRL 7.0 (GLOVE) ×2 IMPLANT
GOWN STRL REUS W/TWL LRG LVL3 (GOWN DISPOSABLE) ×4 IMPLANT
GRASPER SUT TROCAR 14GX15 (MISCELLANEOUS) IMPLANT
IRRIGATOR SUCT 8 DISP DVNC XI (IRRIGATION / IRRIGATOR) IMPLANT
IV NS IRRIG 3000ML ARTHROMATIC (IV SOLUTION) ×1 IMPLANT
KIT TURNOVER KIT A (KITS) ×1 IMPLANT
MANIFOLD NEPTUNE II (INSTRUMENTS) IMPLANT
NDL HYPO 21X1.5 SAFETY (NEEDLE) ×1 IMPLANT
NDL INSUFFLATION 14GA 120MM (NEEDLE) ×1 IMPLANT
NEEDLE HYPO 21X1.5 SAFETY (NEEDLE) ×1 IMPLANT
NEEDLE INSUFFLATION 14GA 120MM (NEEDLE) ×1 IMPLANT
OBTURATOR OPTICAL STND 8 DVNC (TROCAR) ×1
OBTURATOR OPTICALSTD 8 DVNC (TROCAR) ×1 IMPLANT
PACK LAP CHOLE LZT030E (CUSTOM PROCEDURE TRAY) ×1 IMPLANT
PAD ARMBOARD 7.5X6 YLW CONV (MISCELLANEOUS) ×1 IMPLANT
PENCIL HANDSWITCHING (ELECTRODE) ×1 IMPLANT
SCISSORS MNPLR CVD DVNC XI (INSTRUMENTS) ×1 IMPLANT
SEAL CANN UNIV 5-8 DVNC XI (MISCELLANEOUS) ×4 IMPLANT
SET BASIN LINEN APH (SET/KITS/TRAYS/PACK) ×1 IMPLANT
SET TUBE SMOKE EVAC HIGH FLOW (TUBING) ×1 IMPLANT
SUT MNCRL AB 4-0 PS2 18 (SUTURE) ×2 IMPLANT
SUT VICRYL 0 AB UR-6 (SUTURE) IMPLANT
SYR 30ML LL (SYRINGE) ×1 IMPLANT
SYS RETRIEVAL 5MM INZII UNIV (BASKET) ×1
SYSTEM RETRIEVL 5MM INZII UNIV (BASKET) ×1 IMPLANT
WATER STERILE IRR 500ML POUR (IV SOLUTION) ×1 IMPLANT

## 2023-02-01 NOTE — Anesthesia Procedure Notes (Signed)
Procedure Name: Intubation Date/Time: 02/01/2023 8:40 AM  Performed by: Izola Price., CRNAPre-anesthesia Checklist: Patient identified, Emergency Drugs available, Suction available and Patient being monitored Patient Re-evaluated:Patient Re-evaluated prior to induction Oxygen Delivery Method: Circle system utilized Preoxygenation: Pre-oxygenation with 100% oxygen Induction Type: IV induction Ventilation: Mask ventilation without difficulty Laryngoscope Size: Glidescope and 3 Grade View: Grade II Tube type: Oral Tube size: 7.0 mm Number of attempts: 1 Airway Equipment and Method: Stylet, Oral airway and Video-laryngoscopy Placement Confirmation: ETT inserted through vocal cords under direct vision, positive ETCO2 and breath sounds checked- equal and bilateral Secured at: 22 cm Tube secured with: Tape Dental Injury: Teeth and Oropharynx as per pre-operative assessment  Comments: Dlx1 with mac 3, grade 2b view, redundant tissue noticed, unable to pass ett, glidescope utilized, airway secured, bbs, atraumatic.

## 2023-02-01 NOTE — Progress Notes (Addendum)
[facility]  Updated husband earlier. Did not do the post op phone call date before she left hospital, but office will call her now with a appt for 6/19. Will also make sure she knows her post operative instructions.   Algis Greenhouse, MD [facility] [address] [place], [place] [address] [phone] (office)

## 2023-02-01 NOTE — Anesthesia Preprocedure Evaluation (Signed)
Anesthesia Evaluation  Patient identified by MRN, date of birth, ID band Patient awake    Reviewed: Allergy & Precautions, H&P , NPO status , Patient's Chart, lab work & pertinent test results, reviewed documented beta blocker date and time   Airway Mallampati: II  TM Distance: >3 FB Neck ROM: full    Dental no notable dental hx.    Pulmonary neg pulmonary ROS, former smoker   Pulmonary exam normal breath sounds clear to auscultation       Cardiovascular Exercise Tolerance: Good hypertension, negative cardio ROS  Rhythm:regular Rate:Normal     Neuro/Psych  PSYCHIATRIC DISORDERS Anxiety Depression    negative neurological ROS  negative psych ROS   GI/Hepatic negative GI ROS, Neg liver ROS,GERD  ,,  Endo/Other  negative endocrine ROSdiabetes, Type 2    Renal/GU negative Renal ROS  negative genitourinary   Musculoskeletal   Abdominal   Peds  Hematology negative hematology ROS (+)   Anesthesia Other Findings   Reproductive/Obstetrics negative OB ROS                             Anesthesia Physical Anesthesia Plan  ASA: 3  Anesthesia Plan: General and General ETT   Post-op Pain Management:    Induction:   PONV Risk Score and Plan: Ondansetron and Scopolamine patch - Pre-op  Airway Management Planned:   Additional Equipment:   Intra-op Plan:   Post-operative Plan:   Informed Consent: I have reviewed the patients History and Physical, chart, labs and discussed the procedure including the risks, benefits and alternatives for the proposed anesthesia with the patient or authorized representative who has indicated his/her understanding and acceptance.     Dental Advisory Given  Plan Discussed with: CRNA  Anesthesia Plan Comments:        Anesthesia Quick Evaluation

## 2023-02-01 NOTE — Discharge Instructions (Signed)
Discharge Robotic Assisted Laparoscopic Surgery Instructions:  Common Complaints: Right shoulder pain is common after laparoscopic surgery. This is secondary to the gas used in the surgery being trapped under the diaphragm.  Walk to help your body absorb the gas. This will improve in a few days. Pain at the port sites are common, especially the larger port sites. This will improve with time.  Some nausea is common and poor appetite. The main goal is to stay hydrated the first few days after surgery.   Diet/ Activity: Diet as tolerated. You may not have an appetite, but it is important to stay hydrated. Drink 64 ounces of water a day. Your appetite will return with time.  Shower per your regular routine daily.  Do not take hot showers. Take warm showers that are less than 10 minutes. Rest and listen to your body, but do not remain in bed all day.  Walk everyday for at least 15-20 minutes. Deep cough and move around every 1-2 hours in the first few days after surgery.  Do not lift > 10 lbs, perform excessive bending, pushing, pulling, squatting for 1-2 weeks after surgery.  Do not pick at the dermabond glue on your incision sites.  This glue film will remain in place for 1-2 weeks and will start to peel off.  Do not place lotions or balms on your incision unless instructed to specifically by Dr. Shala Baumbach.   Pain Expectations and Narcotics: -After surgery you will have pain associated with your incisions and this is normal. The pain is muscular and nerve pain, and will get better with time. -You are encouraged and expected to take non narcotic medications like tylenol and ibuprofen (when able) to treat pain as multiple modalities can aid with pain treatment. -Narcotics are only used when pain is severe or there is breakthrough pain. -You are not expected to have a pain score of 0 after surgery, as we cannot prevent pain. A pain score of 3-4 that allows you to be functional, move, walk, and tolerate  some activity is the goal. The pain will continue to improve over the days after surgery and is dependent on your surgery. -Due to [place] law, we are only able to give a certain amount of pain medication to treat post operative pain, and we only give additional narcotics on a patient by patient basis.  -For most laparoscopic surgery, studies have shown that the majority of patients only need 10-15 narcotic pills, and for open surgeries most patients only need 15-20.   -Having appropriate expectations of pain and knowledge of pain management with non narcotics is important as we do not want anyone to become addicted to narcotic pain medication.  -Using ice packs in the first 48 hours and heating pads after 48 hours, wearing an abdominal binder (when recommended), and using over the counter medications are all ways to help with pain management.   -Simple acts like meditation and mindfulness practices after surgery can also help with pain control and research has proven the benefit of these practices.  Medication: Take tylenol and ibuprofen as needed for pain control, alternating every 4-6 hours.  Example:  Tylenol 1000mg @ 6am, 12noon, 6pm, 12midnight (Do not exceed 4000mg of tylenol a day). Ibuprofen 800mg @ 9am, 3pm, 9pm, 3am (Do not exceed 3600mg of ibuprofen a day).  Take Roxicodone for breakthrough pain every 4 hours.  Take Colace for constipation related to narcotic pain medication. If you do not have a bowel movement in 2 days,   take Miralax over the counter.  Drink plenty of water to also prevent constipation.   Contact Information: If you have questions or concerns, please call our office, [phone], Monday- Thursday 8AM-5PM and Friday 8AM-12Noon.  If it is after hours or on the weekend, please call [facility], [phone], [phone], and ask to speak to the surgeon on call for Dr. Jaydynn Wolford at [place].   

## 2023-02-01 NOTE — Transfer of Care (Signed)
Immediate Anesthesia Transfer of Care Note  Patient: Alexis Hurst  Procedure(s) Performed: XI ROBOTIC ASSISTED LAPAROSCOPIC CHOLECYSTECTOMY (Abdomen)  Patient Location: PACU  Anesthesia Type:General  Level of Consciousness: drowsy  Airway & Oxygen Therapy: Patient Spontanous Breathing and Patient connected to face mask oxygen  Post-op Assessment: Report given to RN and Post -op Vital signs reviewed and stable  Post vital signs: Reviewed and stable  Last Vitals:  Vitals Value Taken Time  BP 152/98 02/01/23 0945  Temp    Pulse 90 02/01/23 0949  Resp 18 02/01/23 0949  SpO2 96 % 02/01/23 0949  Vitals shown include unvalidated device data.  Last Pain:  Vitals:   02/01/23 0718  PainSc: 0-No pain         Complications: No notable events documented.

## 2023-02-01 NOTE — Op Note (Signed)
[facility] Operative Note  02/01/23  Preoperative Diagnosis: Symptomatic Cholelithiasis   Postoperative Diagnosis: Same   Procedure(s) Performed: Robotic Assisted Laparoscopic Cholecystectomy   Surgeon: Leatrice Jewels. Henreitta Leber, MD   Assistants: No qualified resident was available    Anesthesia: General endotracheal   Anesthesiologist: Dr. Johnnette Litter, MD    Specimens: Gallbladder   Estimated Blood Loss: Minimal   Blood Replacement: None    Complications: None   Wound Class: Contaminated    Operative Indications: The patient was found to have stones on imaging and was symptomatic.  We discussed the risk of the procedure including but not limited to bleeding, infection, injury to the common bile duct, bile leak, need for further procedures, chance of subtotal cholecystectomy.   Findings:  Critical view of safety noted All clips intact at the end of the case Adequate hemostasis   Procedure: Firefly was given in the preoperative area. The patient was taken to the operating room and placed supine. General endotracheal anesthesia was induced. Intravenous antibiotics were  administered per protocol.  An orogastric tube positioned to decompress the stomach. The abdomen was prepared and draped in the usual sterile fashion.   Veress needle was placed at the supraumbilical area and insufflation was started after confirming a positive saline drop test and no immediate increase in abdominal pressure.  After reaching 15 mm, the Veress needle was removed and a 8 mm port was placed via optiview technique supraumbilical, measuring 20 mm away from the suspected position of the gallbladder.  The abdomen was inspected and no abnormalities or injuries were found.  Under direct vision, ports were placed in the following locations in a semi curvilinear position around the target of the gallbladder: Two 8 mm ports on the patient's right each having 8cm clearance to the adjacent ports and one 8  mm port placed on the patient's left 8 cm from the umbilical port. Once ports were placed, the table was placed in the reverse Trendelenburg position with the right side up. The Xi platform was brought into the operative field and docked to the ports successfully.  An endoscope was placed through the umbilical port,prograsper through the most lateral right port, forced bipolar to the port just right of the umbilicus, and then a hook cautery in the left port.   The dome of the gallbladder was grasped with prograsp and retracted over the dome of the liver. Adhesions between the gallbladder and omentum, duodenum and transverse colon were lysed via hook cautery. The infundibulum was grasped with the fenestrated grasper and retracted toward the right lower quadrant. This maneuver exposed Calot's triangle. Firefly was used throughout the dissection to ensure safe visualization of the cystic duct.The peritoneum overlying the gallbladder infundibulum was then dissected and the cystic duct and cystic artery identified.  Critical view of safety with the liver bed clearly visible behind the duct and artery with no additional structures noted.  The cystic duct and cystic artery were doubly clipped and divided close to the gallbladder.    The gallbladder was then dissected from its peritoneal and liver bed attachments by electrocautery. Minimal bile did spill out from the thin walled gallbladder and this was suctioned out. Hemostasis was checked prior to removing the cautery.  The [employer] was undocked and moved out of the field.  A 5mm Endo Catch bag was then placed through the left side port and the gallbladder was removed.  The gallbladder was passed off the table as a specimen. There was no evidence of  bleeding from the gallbladder fossa or cystic artery or leakage of the bile from the cystic duct stump.   The left sided was closed with a PMI and 0 Vicryl suture. The abdomen was desufflated and secondary trocars were removed  under direct vision.  No bleeding was noted. All skin incisions were closed with subcuticular sutures of 4-0 monocryl and dermabond.   Final inspection revealed acceptable hemostasis. All counts were correct at the end of the case. The patient was awakened from anesthesia and extubated without complication. The OG tube was removed.  The patient went to the PACU in stable condition.   Algis Greenhouse, MD [facility] [address] [place], [place] [address] [phone] (office)

## 2023-02-01 NOTE — H&P (Signed)
[facility] History and Physical  Alexis Hurst is a 72 y.o. female.  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 She had an episode a few weeks back of pretty severe RUQ pain and nausea/vomiting a ter eating spicy [place] food. She has since ate similar things and had no attack. She says this attack was very bad though and she does not wish to have another one. Dr. Lodema Hong did a workup and US showed gallstones.    The patient has been off phentermine. No episodes since I saw her 12/2022.  Past Medical History:  Diagnosis Date   Allergic sinusitis 12/18/2012   Allergic urticaria 04/02/2017   Anxiety    COVID-19 virus infection 09/05/2019   Depression    Diabetes mellitus without complication (HCC)    Diverticulosis 12/11/2013   Colonoscopy in 2014 per Dr Darrick Penna, also has small hemmorhoids    Dizziness    Mild Orthostatic   Essential hypertension    Fatigue    History of chest pain    Negative stress echo 2010   Hyperlipidemia    Insomnia    Lab test positive for detection of COVID-19 virus 09/24/2019   Mitral valve prolapse    Non-allergic rhinitis 10/31/2017   Obesity    Palpitations    a. PAC's, PVC's and brief NSVT by prior monitor in 09/2020   Paresthesias    Upper extremity /episode sensation of coldness and numbness    Piles (hemorrhoids) 07/20/2015   Raynauds phenomenon    Possible   Swelling of arm    Swelling of left arm    Past Surgical History:  Procedure Laterality Date   ABDOMINAL HYSTERECTOMY     BREAST EXCISIONAL BIOPSY Left    BREAST SURGERY     CATARACT EXTRACTION Right 02/03/2020   CATARACT EXTRACTION Left 02/09/2020   COLONOSCOPY  06/11/2003   Smith:multiple medium scatered diverticula in the cecum, a sending colon, transverse colon, descending colon, sigmoid colon.   COLONOSCOPY N/A 05/20/2013   TICS, SML IH   COLONOSCOPY WITH PROPOFOL N/A 10/10/2022   Procedure: COLONOSCOPY WITH PROPOFOL;   Surgeon: Lanelle Bal, DO;  [facility]: [facility];  Service: Endoscopy;  Laterality: N/A;  11:30 am   Cystectomy L breast, benign     ESOPHAGOGASTRODUODENOSCOPY (EGD) WITH PROPOFOL N/A 10/10/2022   Procedure: ESOPHAGOGASTRODUODENOSCOPY (EGD) WITH PROPOFOL;  Surgeon: Lanelle Bal, DO;  Location: [facility];  Service: Endoscopy;  Laterality: N/A;   VESICOVAGINAL FISTULA CLOSURE W/ TAH  1989    Family History  Problem Relation Age of Onset   Hypertension Mother    Urticaria Mother    Heart attack Mother    Heart failure Father    Lung cancer Father    Heart disease Father    Asthma Sister    Heart disease Sister    Hypertension Brother    Crohn's disease Brother    Diabetes Brother    Hypertension Sister    Hypertension Sister    Colon cancer Maternal Grandfather        Age greater than 65   Breast cancer Daughter    Allergic rhinitis Neg Hx    Angioedema Neg Hx    Atopy Neg Hx    Eczema Neg Hx    Immunodeficiency Neg Hx     Social History   Tobacco Use   Smoking status: Former    Packs/day: 0.50    Years: 16.00  Additional pack years: 0.00    Total pack years: 8.00    Types: Cigarettes    Start date: 45    Quit date: 09/04/1986    Years since quitting: 36.4   Smokeless tobacco: Never  Vaping Use   Vaping Use: Never used  Substance Use Topics   Alcohol use: Yes    Alcohol/week: 0.0 standard drinks of alcohol    Comment: occ   Drug use: No    Medications: I have reviewed the patient's current medications. Medications Prior to Admission  Medication Sig Dispense Refill Last Dose   acetaminophen (TYLENOL) 500 MG tablet Take 500-1,000 mg by mouth every 6 (six) hours as needed (pain.).   01/31/2023   amLODipine (NORVASC) 5 MG tablet TAKE 1 TABLET(5 MG) BY MOUTH DAILY 90 tablet 3 01/31/2023   aspirin 81 MG EC tablet Take 81 mg by mouth at bedtime.   01/31/2023   atorvastatin (LIPITOR) 10 MG tablet Take 1 tablet (10 mg total) by mouth daily. (Patient  taking differently: Take 10 mg by mouth at bedtime.) 90 tablet 3 01/31/2023   Calcium Carbonate-Vitamin D 600-200 MG-UNIT TABS Take 1 tablet by mouth daily.   01/31/2023   carvedilol (COREG) 6.25 MG tablet TAKE 1 AND 1/2 TABLETS BY MOUTH TWICE DAILY WITH A MEAL 270 tablet 2 02/01/2023 at 0630   cloNIDine (CATAPRES) 0.2 MG tablet Take 1 tablet (0.2 mg total) by mouth daily. (Patient taking differently: Take 0.2 mg by mouth at bedtime.) 90 tablet 3 01/31/2023   FLUoxetine (PROZAC) 40 MG capsule TAKE 1 CAPSULE(40 MG) BY MOUTH DAILY 90 capsule 3 02/01/2023 at 0630   fluticasone (CUTIVATE) 0.05 % cream Apply 1 Application topically 2 (two) times daily as needed (skin irrtation/rash (skin fold-under breast region)).   Past Month   ketoconazole (NIZORAL) 2 % cream Apply 1 Application topically daily as needed for irritation (skin irrtation/rash (skin fold-under breast region)).   Past Month   montelukast (SINGULAIR) 10 MG tablet TAKE 1 TABLET(10 MG) BY MOUTH AT BEDTIME 90 tablet 1 01/31/2023   pantoprazole (PROTONIX) 40 MG tablet TAKE 1 TABLET(40 MG) BY MOUTH TWICE DAILY BEFORE A MEAL 180 tablet 1 02/01/2023 at 0630   spironolactone (ALDACTONE) 25 MG tablet TAKE 1 TABLET(25 MG) BY MOUTH DAILY (Patient taking differently: Take 25 mg by mouth at bedtime.) 90 tablet 3 01/31/2023   traZODone (DESYREL) 100 MG tablet TAKE 1 TABLET(100 MG) BY MOUTH AT BEDTIME (Patient taking differently: Take 100 mg by mouth at bedtime as needed for sleep.) 90 tablet 1 01/31/2023   venlafaxine XR (EFFEXOR-XR) 75 MG 24 hr capsule TAKE 1 CAPSULE(75 MG) BY MOUTH DAILY WITH BREAKFAST 90 capsule 1 02/01/2023 at 0630   Vitamin D, Ergocalciferol, (DRISDOL) 1.25 MG (50000 UNIT) CAPS capsule TAKE 1 CAPSULE BY MOUTH 1 TIME A WEEK (Patient taking differently: Take 50,000 Units by mouth every Thursday.) 12 capsule 1 01/31/2023   albuterol (VENTOLIN HFA) 108 (90 Base) MCG/ACT inhaler Inhale 2 puffs into the lungs every 6 (six) hours as needed for wheezing  or shortness of breath. 8 g 6 More than a month   clobetasol ointment (TEMOVATE) 0.05 % Apply bid to affected area for 2 weeks then 3 x a week (Patient taking differently: Apply 1 Application topically daily as needed (skin irritation.).) 45 g 3 More than a month   fluticasone (FLONASE) 50 MCG/ACT nasal spray Place 2 sprays into both nostrils daily. (Patient taking differently: Place 2 sprays into both nostrils daily as needed for allergies.)  16 g 6 More than a month   Tiotropium Bromide Monohydrate (SPIRIVA RESPIMAT) 1.25 MCG/ACT AERS Inhale 2 puffs into the lungs daily. (Patient taking differently: Inhale 2 puffs into the lungs daily as needed (respiratory issues.).) 4 g 0 More than a month    Allergies  Allergen Reactions   Olmesartan Cough   Lactose Intolerance (Gi) Diarrhea and Nausea And Vomiting      ROS:  A comprehensive review of systems was negative except for: Gastrointestinal: positive for abdominal pain  Blood pressure 127/73, pulse 97, temperature 98.7 F (37.1 C), resp. rate 20, SpO2 99 %. Physical Exam HENT:     Head: Normocephalic.     Mouth/Throat:     Mouth: Mucous membranes are moist.  Eyes:     Extraocular Movements: Extraocular movements intact.  Cardiovascular:     Rate and Rhythm: Normal rate.  Pulmonary:     Effort: Pulmonary effort is normal.  Abdominal:     General: There is no distension.     Palpations: Abdomen is soft.     Tenderness: There is no abdominal tenderness.  Musculoskeletal:        General: Normal range of motion.     Cervical back: Normal range of motion.  Skin:    General: Skin is warm.  Neurological:     General: No focal deficit present.     Mental Status: She is alert and oriented to person, place, and time.  Psychiatric:        Mood and Affect: Mood normal.        Behavior: Behavior normal.     Results: Results for orders placed or performed during the hospital encounter of 02/01/23 (from the past 48 hour(s))  Glucose,  capillary     Status: Abnormal   Collection Time: 02/01/23  7:04 AM  Result Value Ref Range   Glucose-Capillary 106 (H) 70 - 99 mg/dL    Comment: Glucose reference range applies only to samples taken after fasting for at least 8 hours.    CLINICAL DATA:  Right upper quadrant abdomen pain with nausea and vomiting.   EXAM: ULTRASOUND ABDOMEN LIMITED RIGHT UPPER QUADRANT   COMPARISON:  April 12, 2017   FINDINGS: Gallbladder:   Gallstones are identified, largest measures 8.4 mm. No wall thickening visualized. No sonographic Murphy sign noted by sonographer.   Common bile duct:   Diameter: 5.1 mm   Liver:   No focal lesion identified. Mild increased echotexture of the liver. Portal vein is patent on color Doppler imaging with normal direction of blood flow towards the liver.   Other: None.   IMPRESSION: 1. Cholelithiasis without sonographic evidence of acute cholecystitis. 2. Mild increased echotexture of the liver, nonspecific but can be seen in fatty infiltration.     Electronically Signed   By: Sherian Rein M.D.   On: 11/24/2022 09:25  Assessment & Plan:  Nytasha Louch is a 72 y.o. female with stones.   PLAN: I counseled the patient about the indication, risks and benefits of robotic assisted laparoscopic cholecystectomy.  She understands there is a very small chance for bleeding, infection, injury to normal structures (including common bile duct), conversion to open surgery, persistent symptoms, evolution of postcholecystectomy diarrhea, need for secondary interventions, anesthesia reaction, cardiopulmonary issues and other risks not specifically detailed here. I described the expected recovery, the plan for follow-up and the restrictions during the recovery phase.  All questions were answered.    Lucretia Roers 02/01/2023, 8:18 AM

## 2023-02-02 LAB — SURGICAL PATHOLOGY

## 2023-02-07 NOTE — Anesthesia Postprocedure Evaluation (Signed)
Anesthesia Post Note  Patient: Alexis Hurst  Procedure(s) Performed: XI ROBOTIC ASSISTED LAPAROSCOPIC CHOLECYSTECTOMY (Abdomen)  Patient location during evaluation: Phase II Anesthesia Type: General Level of consciousness: awake Pain management: pain level controlled Vital Signs Assessment: post-procedure vital signs reviewed and stable Respiratory status: spontaneous breathing and respiratory function stable Cardiovascular status: blood pressure returned to baseline and stable Postop Assessment: no headache and no apparent nausea or vomiting Anesthetic complications: no Comments: Late entry   No notable events documented.   Last Vitals:  Vitals:   02/01/23 1045 02/01/23 1054  BP: 123/73 128/79  Pulse: 89 92  Resp: 15 16  Temp:  36.6 C  SpO2: 92% 92%    Last Pain:  Vitals:   02/01/23 1054  TempSrc: Oral  PainSc: 3                  Windell Norfolk

## 2023-02-14 ENCOUNTER — Other Ambulatory Visit: Payer: Self-pay | Admitting: Family Medicine

## 2023-02-21 ENCOUNTER — Ambulatory Visit (INDEPENDENT_AMBULATORY_CARE_PROVIDER_SITE_OTHER): Payer: Medicare PPO | Admitting: General Surgery

## 2023-02-21 DIAGNOSIS — K8020 Calculus of gallbladder without cholecystitis without obstruction: Secondary | ICD-10-CM

## 2023-02-21 NOTE — Progress Notes (Signed)
[facility]  I am calling the patient for post operative evaluation. This is not a billable encounter as it is under the global charges for the surgery.  The patient had a robotic assisted cholecystectomy on 5/30. The patient reports that she is doing pretty good. The are tolerating a diet, having good pain control, and having regular Bms but they are not "normal" going every 2-3 days but the stools are soft not loose.  The incisions are healing. Umbilical incision sore but no redness or drainage. The patient has no concerns.   Pathology: FINAL MICROSCOPIC DIAGNOSIS:   A. GALLBLADDER, CHOLECYSTECTOMY:  -  Chronic cholecystitis with cholelithiasis   Will see the patient PRN.   Algis Greenhouse, MD [facility] [address] [place], [place] [address] [phone] (office)

## 2023-02-23 ENCOUNTER — Encounter: Payer: Self-pay | Admitting: Family Medicine

## 2023-02-23 ENCOUNTER — Ambulatory Visit (INDEPENDENT_AMBULATORY_CARE_PROVIDER_SITE_OTHER): Payer: Medicare PPO | Admitting: Family Medicine

## 2023-02-23 ENCOUNTER — Other Ambulatory Visit (HOSPITAL_COMMUNITY): Payer: Self-pay | Admitting: Family Medicine

## 2023-02-23 VITALS — BP 112/73 | HR 95 | Ht 64.0 in | Wt 216.0 lb

## 2023-02-23 DIAGNOSIS — Z0001 Encounter for general adult medical examination with abnormal findings: Secondary | ICD-10-CM

## 2023-02-23 DIAGNOSIS — I10 Essential (primary) hypertension: Secondary | ICD-10-CM | POA: Diagnosis not present

## 2023-02-23 DIAGNOSIS — R7303 Prediabetes: Secondary | ICD-10-CM

## 2023-02-23 DIAGNOSIS — Z1231 Encounter for screening mammogram for malignant neoplasm of breast: Secondary | ICD-10-CM

## 2023-02-23 NOTE — Patient Instructions (Signed)
F/U 4 months, call if you need me sooner   CBC, cmp  and EGFr  today   Mammogram to bne scheduled at checkout   Increase calcium with D to wo tabs daily, no more weekly vit D  No more trazodone  Increase activity slowly as able  Contact this office or surgeon if disabling abdominal pain ensues  Thanks for choosing [facility], we consider it a privelige to serve you.

## 2023-02-24 ENCOUNTER — Encounter: Payer: Self-pay | Admitting: Family Medicine

## 2023-02-24 DIAGNOSIS — Z0001 Encounter for general adult medical examination with abnormal findings: Secondary | ICD-10-CM | POA: Insufficient documentation

## 2023-02-24 LAB — CMP14+EGFR
ALT: 19 IU/L (ref 0–32)
AST: 18 IU/L (ref 0–40)
Albumin: 4.3 g/dL (ref 3.9–4.9)
Alkaline Phosphatase: 118 IU/L (ref 44–121)
BUN/Creatinine Ratio: 10 — ABNORMAL LOW (ref 12–28)
BUN: 9 mg/dL (ref 8–27)
Bilirubin Total: 0.2 mg/dL (ref 0.0–1.2)
CO2: 24 mmol/L (ref 20–29)
Calcium: 9.4 mg/dL (ref 8.7–10.3)
Chloride: 103 mmol/L (ref 96–106)
Creatinine, Ser: 0.89 mg/dL (ref 0.57–1.00)
Globulin, Total: 3 g/dL (ref 1.5–4.5)
Glucose: 112 mg/dL — ABNORMAL HIGH (ref 70–99)
Potassium: 4.6 mmol/L (ref 3.5–5.2)
Sodium: 142 mmol/L (ref 134–144)
Total Protein: 7.3 g/dL (ref 6.0–8.5)
eGFR: 70 mL/min/{1.73_m2} (ref >59)

## 2023-02-24 LAB — CBC WITH DIFFERENTIAL/PLATELET
Basophils Absolute: 0 10*3/uL (ref 0.0–0.2)
Basos: 1 %
EOS (ABSOLUTE): 0.2 10*3/uL (ref 0.0–0.4)
Eos: 5 %
Hematocrit: 37.1 % (ref 34.0–46.6)
Hemoglobin: 12.3 g/dL (ref 11.1–15.9)
Immature Grans (Abs): 0 10*3/uL (ref 0.0–0.1)
Immature Granulocytes: 0 %
Lymphocytes Absolute: 1.5 10*3/uL (ref 0.7–3.1)
Lymphs: 34 %
MCH: 29.7 pg (ref 26.6–33.0)
MCHC: 33.2 g/dL (ref 31.5–35.7)
MCV: 90 fL (ref 79–97)
Monocytes Absolute: 0.6 10*3/uL (ref 0.1–0.9)
Monocytes: 14 %
Neutrophils Absolute: 2 10*3/uL (ref 1.4–7.0)
Neutrophils: 46 %
Platelets: 283 10*3/uL (ref 150–450)
RBC: 4.14 x10E6/uL (ref 3.77–5.28)
RDW: 13.9 % (ref 11.7–15.4)
WBC: 4.4 10*3/uL (ref 3.4–10.8)

## 2023-02-24 NOTE — Assessment & Plan Note (Signed)
Annual exam as documented. Counseling done  re healthy lifestyle involving commitment to 150 minutes exercise per week, heart healthy diet, and attaining healthy weight.The importance of adequate sleep also discussed.  Immunization and cancer screening needs are specifically addressed at this visit.  

## 2023-02-24 NOTE — Assessment & Plan Note (Signed)
Controlled, no change in medication DASH diet and commitment to daily physical activity for a minimum of 30 minutes discussed and encouraged, as a part of hypertension management. The importance of attaining a healthy weight is also discussed.     02/23/2023    8:32 AM 02/01/2023   10:54 AM 02/01/2023   10:45 AM 02/01/2023   10:30 AM 02/01/2023   10:15 AM 02/01/2023   10:00 AM 02/01/2023    9:45 AM  BP/Weight  Systolic BP 112 128 123 129 149 149 152  Diastolic BP 73 79 73 79 95 92 98  Wt. (Lbs) 216        BMI 37.08 kg/m2

## 2023-02-24 NOTE — Progress Notes (Signed)
    Alexis Hurst     MRN: [id number]      DOB: 11/28/1950  Chief Complaint  Patient presents with   Annual Exam   Had cholecystectomy on 5/30, generally has been doing well, just very tired, states however that last night she had excessive cramping and bloating, she had eaten fried fish and a slice of cake, symptoms are gradually subsiding HPI: Patient is in for annual physical exam. Immunization is reviewed   PE: BP 112/73   Pulse 95   Ht 5\' 4"  (1.626 m)   Wt 216 lb (98 kg)   SpO2 97%   BMI 37.08 kg/m   Pleasant  female, alert and oriented x 3, in no cardio-pulmonary distress. Afebrile. HEENT No facial trauma or asymetry. Sinuses non tender.  Extra occullar muscles intact.. External ears normal, . Neck: supple, no adenopathy,JVD or thyromegaly.No bruits.  Chest: Clear to ascultation bilaterally.No crackles or wheezes. Non tender to palpation  Cardiovascular system; Heart sounds normal,  S1 and  S2 ,no S3.  No murmur, or thrill. Apical beat not displaced Peripheral pulses normal.  Abdomen: Soft, , no organomegaly or masses.Superficial tenderness in upper abdomen No bruits. Bowel sounds hyperactive. No guarding,  or rebound. Incision sites are well healed    Musculoskeletal exam: Full ROM of spine, hips , shoulders and knees. No deformity ,swelling or crepitus noted. No muscle wasting or atrophy.   Neurologic: Cranial nerves 2 to 12 intact. Power, tone ,sensation and reflexes normal throughout. No disturbance in gait. No tremor.  Skin: Intact, no ulceration, erythema , scaling or rash noted. Pigmentation normal throughout  Psych; Normal mood and affect. Judgement and concentration normal   Assessment & Plan:  Encounter for Medicare annual examination with abnormal findings Annual exam as documented. Counseling done  re healthy lifestyle involving commitment to 150 minutes exercise per week, heart healthy diet, and attaining healthy weight.The  importance of adequate sleep also discussed. Immunization and cancer screening needs are specifically addressed at this visit.   Essential hypertension Controlled, no change in medication DASH diet and commitment to daily physical activity for a minimum of 30 minutes discussed and encouraged, as a part of hypertension management. The importance of attaining a healthy weight is also discussed.     02/23/2023    8:32 AM 02/01/2023   10:54 AM 02/01/2023   10:45 AM 02/01/2023   10:30 AM 02/01/2023   10:15 AM 02/01/2023   10:00 AM 02/01/2023    9:45 AM  BP/Weight  Systolic BP 112 128 123 129 149 149 152  Diastolic BP 73 79 73 79 95 92 98  Wt. (Lbs) 216        BMI 37.08 kg/m2

## 2023-02-26 ENCOUNTER — Other Ambulatory Visit: Payer: Self-pay | Admitting: Family Medicine

## 2023-02-26 DIAGNOSIS — K219 Gastro-esophageal reflux disease without esophagitis: Secondary | ICD-10-CM

## 2023-03-20 DIAGNOSIS — F411 Generalized anxiety disorder: Secondary | ICD-10-CM | POA: Diagnosis not present

## 2023-03-20 DIAGNOSIS — E669 Obesity, unspecified: Secondary | ICD-10-CM | POA: Diagnosis not present

## 2023-03-20 DIAGNOSIS — M1990 Unspecified osteoarthritis, unspecified site: Secondary | ICD-10-CM | POA: Diagnosis not present

## 2023-03-20 DIAGNOSIS — F325 Major depressive disorder, single episode, in full remission: Secondary | ICD-10-CM | POA: Diagnosis not present

## 2023-03-20 DIAGNOSIS — E785 Hyperlipidemia, unspecified: Secondary | ICD-10-CM | POA: Diagnosis not present

## 2023-03-20 DIAGNOSIS — J301 Allergic rhinitis due to pollen: Secondary | ICD-10-CM | POA: Diagnosis not present

## 2023-03-20 DIAGNOSIS — I471 Supraventricular tachycardia, unspecified: Secondary | ICD-10-CM | POA: Diagnosis not present

## 2023-03-20 DIAGNOSIS — R32 Unspecified urinary incontinence: Secondary | ICD-10-CM | POA: Diagnosis not present

## 2023-03-20 DIAGNOSIS — K219 Gastro-esophageal reflux disease without esophagitis: Secondary | ICD-10-CM | POA: Diagnosis not present

## 2023-04-05 ENCOUNTER — Other Ambulatory Visit: Payer: Self-pay | Admitting: Family Medicine

## 2023-04-28 LAB — GLUCOSE, POCT (MANUAL RESULT ENTRY): Glucose Fasting, POC: 104 mg/dL — AB (ref 70–99)

## 2023-04-28 NOTE — Progress Notes (Signed)
No SDOH needs at this time. Pt has PCP

## 2023-05-02 ENCOUNTER — Encounter: Payer: Self-pay | Admitting: *Deleted

## 2023-05-02 NOTE — Progress Notes (Addendum)
Pt attended 04/28/23 screening event where her b/p was 118/72 and her blood sugar was 104. At the event, the pt noted that her PCP is Dr. Syliva Overman at [facility] and she did not identify any SDOH insecurities. Chart review indicated pt had her annual exam (and AWV) with Dr. Lodema Hong on 02/23/23 and has a future appt with Dr. Lodema Hong on 06/26/23. Pts 8/24 event results sent to her PCP via.EPIC in-basket message. Per chart documentation,pt also has ongoing cardiology and gastroenterology support care. No additional health equity team support indicated at this time.

## 2023-05-28 ENCOUNTER — Other Ambulatory Visit: Payer: Self-pay | Admitting: Family Medicine

## 2023-06-12 ENCOUNTER — Ambulatory Visit: Payer: Medicare PPO | Admitting: Student

## 2023-06-25 ENCOUNTER — Other Ambulatory Visit: Payer: Self-pay | Admitting: Family Medicine

## 2023-06-26 ENCOUNTER — Encounter: Payer: Self-pay | Admitting: Family Medicine

## 2023-06-26 ENCOUNTER — Ambulatory Visit (INDEPENDENT_AMBULATORY_CARE_PROVIDER_SITE_OTHER): Payer: Medicare PPO | Admitting: Family Medicine

## 2023-06-26 VITALS — BP 120/82 | HR 97 | Ht 64.0 in | Wt 221.1 lb

## 2023-06-26 DIAGNOSIS — I10 Essential (primary) hypertension: Secondary | ICD-10-CM

## 2023-06-26 DIAGNOSIS — M79651 Pain in right thigh: Secondary | ICD-10-CM | POA: Diagnosis not present

## 2023-06-26 DIAGNOSIS — R7303 Prediabetes: Secondary | ICD-10-CM | POA: Diagnosis not present

## 2023-06-26 DIAGNOSIS — M654 Radial styloid tenosynovitis [de Quervain]: Secondary | ICD-10-CM | POA: Diagnosis not present

## 2023-06-26 DIAGNOSIS — Z23 Encounter for immunization: Secondary | ICD-10-CM

## 2023-06-26 DIAGNOSIS — F418 Other specified anxiety disorders: Secondary | ICD-10-CM | POA: Diagnosis not present

## 2023-06-26 DIAGNOSIS — E785 Hyperlipidemia, unspecified: Secondary | ICD-10-CM | POA: Diagnosis not present

## 2023-06-26 DIAGNOSIS — N3946 Mixed incontinence: Secondary | ICD-10-CM

## 2023-06-26 MED ORDER — MELOXICAM 15 MG PO TABS: ORAL_TABLET | ORAL | 0 refills | Status: DC

## 2023-06-26 MED ORDER — MIRABEGRON ER 25 MG PO TB24: 25.0000 mg | ORAL_TABLET | Freq: Every day | ORAL | 2 refills | Status: DC

## 2023-06-26 MED ORDER — PREDNISONE 10 MG PO TABS
10.0000 mg | ORAL_TABLET | Freq: Two times a day (BID) | ORAL | 0 refills | Status: DC
Start: 2023-06-26 — End: 2023-09-20

## 2023-06-26 NOTE — Patient Instructions (Addendum)
Follow-up in early January reevaluate chronic conditions, call if you need me sooner.  Flu vaccine today.  You do need the shingles vaccines as well as the current COVID-vaccine, both are at your pharmacy.  Labs today lipid panel CMP and EGFR and HbA1c.  For localized right thigh pain a short course of medication is prescribed please take as directed.  Please send a message if symptoms persist after 2 weeks, you will be referred to orthopedics.  This medication will also help bilateral thumb pain from arthritis and tendinitis.  Please give consideration to seeing the diabetic educator for assistance with food choices, as of today you are still prediabetic and this can be markedly improved with changing food choices.  For incontinence Myrbetriq tablet is prescribed once daily at the lower dose, this dose can be increased if needed.  It is important that you exercise regularly at least 30 minutes 5 times a week. If you develop chest pain, have severe difficulty breathing, or feel very tired, stop exercising immediately and seek medical attention   All the best for 2025!  Thanks for choosing [facility], we consider it a privelige to serve you.

## 2023-06-26 NOTE — Progress Notes (Addendum)
Alexis Hurst     MRN: [id number]      DOB: [birth year]  Chief Complaint  Patient presents with   Follow-up    Follow up R side leg pain comes and goes, discuss labs,flu shot needs shingles and covid    HPI Alexis Hurst is here for follow up and re-evaluation of chronic medical conditions, medication management and review of any available recent lab and radiology data.  Preventive health is updated, specifically  Cancer screening and Immunization.   Questions or concerns regarding consultations or procedures which the PT has had in the interim are  addressed. The PT denies any adverse reactions to current medications since the last visit.  2 week h/o right lateral thigh pain localized and radiates to hip aggravated by weight bearing C/o urinary incontinence, despite kiegels and behavior modification ROS Denies recent fever or chills. Denies sinus pressure, nasal congestion, ear pain or sore throat. Denies chest congestion, productive cough or wheezing. Denies chest pains, palpitations and leg swelling Denies abdominal pain, nausea, vomiting,diarrhea or constipation.   Denies dysuria, frequency,  Denies uncontrolled depression, anxiety or insomnia. Denies skin break down or rash.   PE  BP 120/82   Pulse 97   Ht 5\' 4"  (1.626 m)   Wt 221 lb 1.3 oz (100.3 kg)   SpO2 91%   BMI 37.95 kg/m  \ Patient alert and oriented and in no cardiopulmonary distress.  HEENT: No facial asymmetry, EOMI,     Neck supple .  Chest: Clear to auscultation bilaterally.  CVS: S1, S2 no murmurs, no S3.Regular rate.  ABD: Soft non tender.   Ext: No edema  MS: Adequate ROM spine, shoulders, hips and knees.Tender over right thigh, reduced ROM thumbs with tenderness over tendons  Skin: Intact, no ulcerations or rash noted.  Psych: Good eye contact, normal affect. Memory intact not anxious or depressed appearing.  CNS: CN 2-12 intact, power,  normal throughout.no focal deficits  noted.   Assessment & Plan  Essential hypertension Controlled, no change in medication DASH diet and commitment to daily physical activity for a minimum of 30 minutes discussed and encouraged, as a part of hypertension management. The importance of attaining a healthy weight is also discussed.     06/26/2023   10:41 AM 06/26/2023   10:33 AM 06/26/2023    9:44 AM 04/28/2023   11:46 AM 02/23/2023    8:32 AM 02/01/2023   10:54 AM 02/01/2023   10:45 AM  BP/Weight  Systolic BP 120 128 135 118 112 128 123  Diastolic BP 82 84 84 72 73 79 73  Wt. (Lbs)   221.08  216    BMI   37.95 kg/m2  37.08 kg/m2         Hyperlipidemia LDL goal <100 Hyperlipidemia:Low fat diet discussed and encouraged. Controlled, no change in medication    Lipid Panel  Lab Results  Component Value Date   CHOL 158 06/26/2023   HDL 72 06/26/2023   LDLCALC 70 06/26/2023   TRIG 85 06/26/2023   CHOLHDL 2.2 06/26/2023       Morbid obesity (HCC)  Patient re-educated about  the importance of commitment to a  minimum of 150 minutes of exercise per week as able.  The importance of healthy food choices with portion control discussed, as well as eating regularly and within a 12 hour window most days. The need to choose "clean , green" food 50 to 75% of the time is discussed, as well as to  make water the primary drink and set a goal of 64 ounces water daily.       06/26/2023    9:44 AM 02/23/2023    8:32 AM 02/01/2023    8:15 AM  Weight /BMI  Weight 221 lb 1.3 oz 216 lb 214 lb 15.2 oz  Height 5\' 4"  (1.626 m) 5\' 4"  (1.626 m) 5\' 4"  (1.626 m)  BMI 37.95 kg/m2 37.08 kg/m2 36.9 kg/m2    worsened  Prediabetes Patient educated about the importance of limiting  Carbohydrate intake , the need to commit to daily physical activity for a minimum of 30 minutes , and to commit weight loss. The fact that changes in all these areas will reduce or eliminate all together the development of diabetes is stressed.       Latest Ref Rng & Units 06/26/2023   10:47 AM 02/23/2023   11:13 AM 01/30/2023    4:11 PM 01/30/2023    2:30 PM 01/08/2023   11:07 AM  Diabetic Labs  HbA1c 4.8 - 5.6 % 6.4    6.4    Chol 100 - 199 mg/dL 841   324   401   HDL >02 mg/dL 72   76   65   Calc LDL 0 - 99 mg/dL 70   76   72   Triglycerides 0 - 149 mg/dL 85   92   95   Creatinine 0.57 - 1.00 mg/dL 7.25  3.66          44/11/4740   10:41 AM 06/26/2023   10:33 AM 06/26/2023    9:44 AM 04/28/2023   11:46 AM 02/23/2023    8:32 AM 02/01/2023   10:54 AM 02/01/2023   10:45 AM  BP/Weight  Systolic BP 120 128 135 118 112 128 123  Diastolic BP 82 84 84 72 73 79 73  Wt. (Lbs)   221.08  216    BMI   37.95 kg/m2  37.08 kg/m2        Latest Ref Rng & Units 01/22/2023   12:00 AM 01/18/2021   12:00 AM  Foot/eye exam completion dates  Eye Exam No Retinopathy No Retinopathy     No Retinopathy         This result is from an external source.      Encounter for immunization After obtaining informed consent, the vaccine is  administered , with no adverse effect noted at the time of administration.   Right thigh pain Prednisone x 5 days  De Quervain's tenosynovitis, bilateral Prednisone x 5 days, refer Ortho if persists  Depression with anxiety Controlled, no change in medication   Urinary incontinence Worsening, triial of low dose miberagon

## 2023-06-27 LAB — CMP14+EGFR
ALT: 22 [IU]/L (ref 0–32)
AST: 21 [IU]/L (ref 0–40)
Albumin: 4.3 g/dL (ref 3.9–4.9)
Alkaline Phosphatase: 111 [IU]/L (ref 44–121)
BUN/Creatinine Ratio: 11 — ABNORMAL LOW (ref 12–28)
BUN: 10 mg/dL (ref 8–27)
Bilirubin Total: 0.4 mg/dL (ref 0.0–1.2)
CO2: 24 mmol/L (ref 20–29)
Calcium: 8.9 mg/dL (ref 8.7–10.3)
Chloride: 103 mmol/L (ref 96–106)
Creatinine, Ser: 0.95 mg/dL (ref 0.57–1.00)
Globulin, Total: 2.8 g/dL (ref 1.5–4.5)
Glucose: 112 mg/dL — ABNORMAL HIGH (ref 70–99)
Potassium: 3.9 mmol/L (ref 3.5–5.2)
Sodium: 141 mmol/L (ref 134–144)
Total Protein: 7.1 g/dL (ref 6.0–8.5)
eGFR: 64 mL/min/{1.73_m2} (ref >59)

## 2023-06-27 LAB — LIPID PANEL
Chol/HDL Ratio: 2.2 ratio (ref 0.0–4.4)
Cholesterol, Total: 158 mg/dL (ref 100–199)
HDL: 72 mg/dL (ref >39)
LDL Chol Calc (NIH): 70 mg/dL (ref 0–99)
Triglycerides: 85 mg/dL (ref 0–149)
VLDL Cholesterol Cal: 16 mg/dL (ref 5–40)

## 2023-06-27 LAB — HEMOGLOBIN A1C
Est. average glucose Bld gHb Est-mCnc: 137 mg/dL
Hgb A1c MFr Bld: 6.4 % — ABNORMAL HIGH (ref 4.8–5.6)

## 2023-07-03 DIAGNOSIS — Z23 Encounter for immunization: Secondary | ICD-10-CM | POA: Insufficient documentation

## 2023-07-03 NOTE — Assessment & Plan Note (Signed)
  Patient re-educated about  the importance of commitment to a  minimum of 150 minutes of exercise per week as able.  The importance of healthy food choices with portion control discussed, as well as eating regularly and within a 12 hour window most days. The need to choose "clean , green" food 50 to 75% of the time is discussed, as well as to make water the primary drink and set a goal of 64 ounces water daily.       06/26/2023    9:44 AM 02/23/2023    8:32 AM 02/01/2023    8:15 AM  Weight /BMI  Weight 221 lb 1.3 oz 216 lb 214 lb 15.2 oz  Height 5\' 4"  (1.626 m) 5\' 4"  (1.626 m) 5\' 4"  (1.626 m)  BMI 37.95 kg/m2 37.08 kg/m2 36.9 kg/m2    worsened

## 2023-07-03 NOTE — Assessment & Plan Note (Signed)
Patient educated about the importance of limiting  Carbohydrate intake , the need to commit to daily physical activity for a minimum of 30 minutes , and to commit weight loss. The fact that changes in all these areas will reduce or eliminate all together the development of diabetes is stressed.      Latest Ref Rng & Units 06/26/2023   10:47 AM 02/23/2023   11:13 AM 01/30/2023    4:11 PM 01/30/2023    2:30 PM 01/08/2023   11:07 AM  Diabetic Labs  HbA1c 4.8 - 5.6 % 6.4    6.4    Chol 100 - 199 mg/dL 409   811   914   HDL >78 mg/dL 72   76   65   Calc LDL 0 - 99 mg/dL 70   76   72   Triglycerides 0 - 149 mg/dL 85   92   95   Creatinine 0.57 - 1.00 mg/dL 2.95  6.21          30/86/5784   10:41 AM 06/26/2023   10:33 AM 06/26/2023    9:44 AM 04/28/2023   11:46 AM 02/23/2023    8:32 AM 02/01/2023   10:54 AM 02/01/2023   10:45 AM  BP/Weight  Systolic BP 120 128 135 118 112 128 123  Diastolic BP 82 84 84 72 73 79 73  Wt. (Lbs)   221.08  216    BMI   37.95 kg/m2  37.08 kg/m2        Latest Ref Rng & Units 01/22/2023   12:00 AM 01/18/2021   12:00 AM  Foot/eye exam completion dates  Eye Exam No Retinopathy No Retinopathy     No Retinopathy         This result is from an external source.

## 2023-07-03 NOTE — Assessment & Plan Note (Signed)
Hyperlipidemia:Low fat diet discussed and encouraged. Controlled, no change in medication    Lipid Panel  Lab Results  Component Value Date   CHOL 158 06/26/2023   HDL 72 06/26/2023   LDLCALC 70 06/26/2023   TRIG 85 06/26/2023   CHOLHDL 2.2 06/26/2023

## 2023-07-03 NOTE — Assessment & Plan Note (Signed)
Controlled, no change in medication DASH diet and commitment to daily physical activity for a minimum of 30 minutes discussed and encouraged, as a part of hypertension management. The importance of attaining a healthy weight is also discussed.     06/26/2023   10:41 AM 06/26/2023   10:33 AM 06/26/2023    9:44 AM 04/28/2023   11:46 AM 02/23/2023    8:32 AM 02/01/2023   10:54 AM 02/01/2023   10:45 AM  BP/Weight  Systolic BP 120 128 135 118 112 128 123  Diastolic BP 82 84 84 72 73 79 73  Wt. (Lbs)   221.08  216    BMI   37.95 kg/m2  37.08 kg/m2

## 2023-07-08 NOTE — Progress Notes (Unsigned)
GI Office Note    Referring Provider: Kerri Perches, MD Primary Care Physician:  Kerri Perches, MD  Primary Gastroenterologist: Hennie Duos. Marletta Lor, DO   Chief Complaint   No chief complaint on file.   History of Present Illness   Alexis Hurst is a 72 y.o. female presenting today for follow up. Last seen 01/2023.   History of Zenker's, elevated LFTs, cholelithiasisi,GERD.  RUQ U/S 11/2022 IMPRESSION: 1. Cholelithiasis without sonographic evidence of acute cholecystitis. 2. Mild increased echotexture of the liver, nonspecific but can be seen in fatty infiltration.  EGD February 2024:  -Zenker's diverticulum noted in the posterior oropharynx -2 cm hiatal hernia -Z-line regular, 36 cm from the incisors -Normal stomach -Normal duodenal bulb, first portion of the duodenum and second portion duodenum   Colonoscopy February 2024: -Diverticulosis in the sigmoid colon, descending colon, transverse colon -Repeat colonoscopy in 10 years for screening purposes if benefits outweigh risk  Medications   Current Outpatient Medications  Medication Sig Dispense Refill   acetaminophen (TYLENOL) 500 MG tablet Take 500-1,000 mg by mouth every 6 (six) hours as needed (pain.).     amLODipine (NORVASC) 5 MG tablet TAKE 1 TABLET(5 MG) BY MOUTH DAILY 90 tablet 3   aspirin 81 MG EC tablet Take 81 mg by mouth at bedtime.     atorvastatin (LIPITOR) 10 MG tablet Take 1 tablet (10 mg total) by mouth daily. (Patient taking differently: Take 10 mg by mouth at bedtime.) 90 tablet 3   Calcium Carbonate-Vitamin D 600-200 MG-UNIT TABS Take 1 tablet by mouth daily.     carvedilol (COREG) 6.25 MG tablet TAKE 1 AND 1/2 TABLETS BY MOUTH TWICE DAILY WITH A MEAL 270 tablet 2   clobetasol ointment (TEMOVATE) 0.05 % Apply bid to affected area for 2 weeks then 3 x a week (Patient taking differently: Apply 1 Application topically daily as needed (skin irritation.).) 45 g 3   cloNIDine  (CATAPRES) 0.2 MG tablet TAKE 1 TABLET(0.2 MG) BY MOUTH DAILY 90 tablet 3   FLUoxetine (PROZAC) 40 MG capsule TAKE 1 CAPSULE(40 MG) BY MOUTH DAILY 90 capsule 3   fluticasone (CUTIVATE) 0.05 % cream Apply 1 Application topically 2 (two) times daily as needed (skin irrtation/rash (skin fold-under breast region)).     fluticasone (FLONASE) 50 MCG/ACT nasal spray Place 2 sprays into both nostrils daily. (Patient taking differently: Place 2 sprays into both nostrils daily as needed for allergies.) 16 g 6   meloxicam (MOBIC) 15 MG tablet Take one tablet by mouht once daily for 10 days , then as needed 30 tablet 0   mirabegron ER (MYRBETRIQ) 25 MG TB24 tablet Take 1 tablet (25 mg total) by mouth daily. 30 tablet 2   montelukast (SINGULAIR) 10 MG tablet TAKE 1 TABLET(10 MG) BY MOUTH AT BEDTIME 90 tablet 1   pantoprazole (PROTONIX) 40 MG tablet TAKE 1 TABLET(40 MG) BY MOUTH TWICE DAILY BEFORE A MEAL 180 tablet 1   predniSONE (DELTASONE) 10 MG tablet Take 1 tablet (10 mg total) by mouth 2 (two) times daily with a meal. 10 tablet 0   spironolactone (ALDACTONE) 25 MG tablet TAKE 1 TABLET(25 MG) BY MOUTH DAILY 90 tablet 3   venlafaxine XR (EFFEXOR-XR) 75 MG 24 hr capsule TAKE 1 CAPSULE(75 MG) BY MOUTH DAILY WITH BREAKFAST 90 capsule 1   No current facility-administered medications for this visit.    Allergies   Allergies as of 07/09/2023 - Review Complete 06/26/2023  Allergen Reaction Noted  Olmesartan Cough 02/05/2019   Lactose intolerance (gi) Diarrhea and Nausea And Vomiting 05/07/2013     Past Medical History   Past Medical History:  Diagnosis Date   Allergic sinusitis 12/18/2012   Allergic urticaria 04/02/2017   Anxiety    COVID-19 virus infection 09/05/2019   Depression    Diabetes mellitus without complication (HCC)    Diverticulosis 12/11/2013   Colonoscopy in 2014 per Dr Darrick Penna, also has small hemmorhoids    Dizziness    Mild Orthostatic   Essential hypertension    Fatigue     History of chest pain    Negative stress echo 2010   Hyperlipidemia    Insomnia    Lab test positive for detection of COVID-19 virus 09/24/2019   Mitral valve prolapse    Non-allergic rhinitis 10/31/2017   Obesity    Palpitations    a. PAC's, PVC's and brief NSVT by prior monitor in 09/2020   Paresthesias    Upper extremity /episode sensation of coldness and numbness    Piles (hemorrhoids) 07/20/2015   Raynauds phenomenon    Possible   Swelling of arm    Swelling of left arm    Past Surgical History   Past Surgical History:  Procedure Laterality Date   ABDOMINAL HYSTERECTOMY     BREAST EXCISIONAL BIOPSY Left    BREAST SURGERY     CATARACT EXTRACTION Right 02/03/2020   CATARACT EXTRACTION Left 02/09/2020   COLONOSCOPY  06/11/2003   Smith:multiple medium scatered diverticula in the cecum, a sending colon, transverse colon, descending colon, sigmoid colon.   COLONOSCOPY N/A 05/20/2013   TICS, SML IH   COLONOSCOPY WITH PROPOFOL N/A 10/10/2022   Procedure: COLONOSCOPY WITH PROPOFOL;  Surgeon: Lanelle Bal, DO;  Location: [facility];  Service: Endoscopy;  Laterality: N/A;  11:30 am   Cystectomy L breast, benign     ESOPHAGOGASTRODUODENOSCOPY (EGD) WITH PROPOFOL N/A 10/10/2022   Procedure: ESOPHAGOGASTRODUODENOSCOPY (EGD) WITH PROPOFOL;  Surgeon: Lanelle Bal, DO;  Location: [facility];  Service: Endoscopy;  Laterality: N/A;   VESICOVAGINAL FISTULA CLOSURE W/ TAH  1989    Past Family History   Family History  Problem Relation Age of Onset   Hypertension Mother    Urticaria Mother    Heart attack Mother    Heart failure Father    Lung cancer Father    Heart disease Father    Asthma Sister    Heart disease Sister    Hypertension Brother    Crohn's disease Brother    Diabetes Brother    Hypertension Sister    Hypertension Sister    Colon cancer Maternal Grandfather        Age greater than 70   Breast cancer Daughter    Allergic rhinitis Neg Hx     Angioedema Neg Hx    Atopy Neg Hx    Eczema Neg Hx    Immunodeficiency Neg Hx     Past Social History   Social History   Socioeconomic History   Marital status: Married    Spouse name: Alvino   Number of children: 2   Years of education: Not on file   Highest education level: Not on file  Occupational History   Occupation: Runner, broadcasting/film/video   Tobacco Use   Smoking status: Former    Current packs/day: 0.00    Average packs/day: 0.5 packs/day for 16.0 years (8.0 ttl pk-yrs)    Types: Cigarettes    Start date: 27    Quit  date: 09/04/1986    Years since quitting: 36.8   Smokeless tobacco: Never  Vaping Use   Vaping status: Never Used  Substance and Sexual Activity   Alcohol use: Yes    Alcohol/week: 0.0 standard drinks of alcohol    Comment: occ   Drug use: No   Sexual activity: Yes    Birth control/protection: Surgical    Comment: hyst  Other Topics Concern   Not on file  Social History Narrative   1 son, 1 daughter   9 grandchildren.   Social Determinants of Health   Financial Resource Strain: Low Risk  (06/29/2021)   Overall Financial Resource Strain (CARDIA)    Difficulty of Paying Living Expenses: Not hard at all  Food Insecurity: No Food Insecurity (04/28/2023)   Hunger Vital Sign    Worried About Running Out of Food in the Last Year: Never true    Ran Out of Food in the Last Year: Never true  Transportation Needs: No Transportation Needs (04/28/2023)   PRAPARE - Administrator, Civil Service (Medical): No    Lack of Transportation (Non-Medical): No  Physical Activity: Sufficiently Active (06/29/2021)   Exercise Vital Sign    Days of Exercise per Week: 5 days    Minutes of Exercise per Session: 30 min  Stress: No Stress Concern Present (06/29/2021)   [employer] of Occupational Health - Occupational Stress Questionnaire    Feeling of Stress : Only a little  Social Connections: Socially Integrated (06/29/2021)   Social Connection and Isolation  Panel [NHANES]    Frequency of Communication with Friends and Family: More than three times a week    Frequency of Social Gatherings with Friends and Family: More than three times a week    Attends Religious Services: More than 4 times per year    Active Member of [employer] or Organizations: Yes    Attends Engineer, structural: More than 4 times per year    Marital Status: Married  Catering manager Violence: Not At Risk (04/28/2023)   Humiliation, Afraid, Rape, and Kick questionnaire    Fear of Current or Ex-Partner: No    Emotionally Abused: No    Physically Abused: No    Sexually Abused: No    Review of Systems   General: Negative for anorexia, weight loss, fever, chills, fatigue, weakness. ENT: Negative for hoarseness, difficulty swallowing , nasal congestion. CV: Negative for chest pain, angina, palpitations, dyspnea on exertion, peripheral edema.  Respiratory: Negative for dyspnea at rest, dyspnea on exertion, cough, sputum, wheezing.  GI: See history of present illness. GU:  Negative for dysuria, hematuria, urinary incontinence, urinary frequency, nocturnal urination.  Endo: Negative for unusual weight change.     Physical Exam   There were no vitals taken for this visit.   General: Well-nourished, well-developed in no acute distress.  Eyes: No icterus. Mouth: Oropharyngeal mucosa moist and pink , no lesions erythema or exudate. Lungs: Clear to auscultation bilaterally.  Heart: Regular rate and rhythm, no murmurs rubs or gallops.  Abdomen: Bowel sounds are normal, nontender, nondistended, no hepatosplenomegaly or masses,  no abdominal bruits or hernia , no rebound or guarding.  Rectal: ***  Extremities: No lower extremity edema. No clubbing or deformities. Neuro: Alert and oriented x 4   Skin: Warm and dry, no jaundice.   Psych: Alert and cooperative, normal mood and affect.  Labs   Lab Results  Component Value Date   HGBA1C 6.4 (H) 06/26/2023   Lab Results  Component Value Date   NA 141 06/26/2023   CL 103 06/26/2023   K 3.9 06/26/2023   CO2 24 06/26/2023   BUN 10 06/26/2023   CREATININE 0.95 06/26/2023   EGFR 64 06/26/2023   CALCIUM 8.9 06/26/2023   ALBUMIN 4.3 06/26/2023   GLUCOSE 112 (H) 06/26/2023   Lab Results  Component Value Date   ALT 22 06/26/2023   AST 21 06/26/2023   ALKPHOS 111 06/26/2023   BILITOT 0.4 06/26/2023   Lab Results  Component Value Date   WBC 4.4 02/23/2023   HGB 12.3 02/23/2023   HCT 37.1 02/23/2023   MCV 90 02/23/2023   PLT 283 02/23/2023    Imaging Studies   No results found.  Assessment       PLAN   ***   Leanna Battles. Melvyn Neth, MHS, PA-C [facility]

## 2023-07-09 ENCOUNTER — Encounter: Payer: Self-pay | Admitting: Gastroenterology

## 2023-07-09 ENCOUNTER — Ambulatory Visit (INDEPENDENT_AMBULATORY_CARE_PROVIDER_SITE_OTHER): Payer: Medicare PPO | Admitting: Gastroenterology

## 2023-07-09 VITALS — BP 133/81 | HR 96 | Temp 97.8°F | Ht 64.0 in | Wt 222.4 lb

## 2023-07-09 DIAGNOSIS — K225 Diverticulum of esophagus, acquired: Secondary | ICD-10-CM | POA: Diagnosis not present

## 2023-07-09 DIAGNOSIS — K219 Gastro-esophageal reflux disease without esophagitis: Secondary | ICD-10-CM | POA: Diagnosis not present

## 2023-07-09 NOTE — Patient Instructions (Signed)
Try cutting back on pantoprazole to once daily before a meal. If you experience mostly night time symptoms, then try taking before supper. Otherwise, if you typically experience daytime symptoms, take before breakfast.  If you have recurrent heartburn, cough, difficulty swallowing on a regular basis, then you may need to continue twice daily dosing. Try adding fiber supplement, 3 to 4 g daily, to help with stool leakage.  Return to the office in 1 year or call sooner if needed.

## 2023-07-13 DIAGNOSIS — R32 Unspecified urinary incontinence: Secondary | ICD-10-CM | POA: Insufficient documentation

## 2023-07-13 DIAGNOSIS — M654 Radial styloid tenosynovitis [de Quervain]: Secondary | ICD-10-CM | POA: Insufficient documentation

## 2023-07-13 DIAGNOSIS — M79651 Pain in right thigh: Secondary | ICD-10-CM | POA: Insufficient documentation

## 2023-07-13 NOTE — Assessment & Plan Note (Signed)
Prednisone x5 days

## 2023-07-13 NOTE — Assessment & Plan Note (Signed)
Prednisone x 5 days, refer Ortho if persists

## 2023-07-13 NOTE — Assessment & Plan Note (Signed)
After obtaining informed consent, the vaccine is  administered , with no adverse effect noted at the time of administration.  

## 2023-07-13 NOTE — Assessment & Plan Note (Signed)
Controlled, no change in medication  

## 2023-07-13 NOTE — Assessment & Plan Note (Signed)
Worsening, triial of low dose miberagon

## 2023-07-17 ENCOUNTER — Ambulatory Visit: Payer: Medicare PPO | Attending: Student | Admitting: Student

## 2023-07-17 ENCOUNTER — Encounter: Payer: Self-pay | Admitting: Student

## 2023-07-17 VITALS — BP 128/72 | HR 90 | Ht 64.0 in | Wt 220.0 lb

## 2023-07-17 DIAGNOSIS — R600 Localized edema: Secondary | ICD-10-CM

## 2023-07-17 DIAGNOSIS — R002 Palpitations: Secondary | ICD-10-CM | POA: Diagnosis not present

## 2023-07-17 DIAGNOSIS — I10 Essential (primary) hypertension: Secondary | ICD-10-CM

## 2023-07-17 DIAGNOSIS — E782 Mixed hyperlipidemia: Secondary | ICD-10-CM | POA: Diagnosis not present

## 2023-07-17 MED ORDER — ATORVASTATIN CALCIUM 10 MG PO TABS: 10.0000 mg | ORAL_TABLET | Freq: Every day | ORAL | 3 refills | Status: DC

## 2023-07-17 MED ORDER — CARVEDILOL 6.25 MG PO TABS: 9.3750 mg | ORAL_TABLET | Freq: Two times a day (BID) | ORAL | 3 refills | Status: DC

## 2023-07-17 NOTE — Progress Notes (Signed)
Cardiology Office Note    Date:  07/17/2023  ID:  Kailin, Bakos [birth year], MRN [id number] Cardiologist: Nona Dell, MD    History of Present Illness:    Quaniqua Chaffee is a 72 y.o. female with past medical history of palpitations (PAC's, PVC's and brief NSVT by prior monitor in 09/2020), HTN, HLD, GERD and prediabetes who presents to the office today for 52-month follow-up.  She was last examined by Dr. Diona Browner in 08/2022 and reported overall doing well from a cardiac perspective at that time. Palpitations were overall well-controlled on her current medical therapy and she was continued on Coreg 9.375 mg twice daily. LDL had been elevated at 129 when checked by her PCP and it was recommended to restart Lipitor 10 mg daily.  In talking with the patient today, she reports overall doing well since her last office visit. She does experience occasional palpitations which typically occur in the setting of increased caffeine intake. She does report consuming sodas and tea on a regular basis and notices worsening palpitations with this. There has not been any progression in frequency or severity of symptoms. She denies any dyspnea on exertion with routine activities or associated chest pain. No specific orthopnea, PND or pitting edema. Does experience occasional isolated swelling along her ankle joints.  Studies Reviewed:   EKG: EKG is ordered today and demonstrates:   EKG Interpretation Date/Time:  Tuesday July 17 2023 13:20:53 EST Ventricular Rate:  91 PR Interval:  132 QRS Duration:  76 QT Interval:  376 QTC Calculation: 462 R Axis:   75  Text Interpretation: Normal sinus rhythm When compared with ECG of 17-Aug-2020 15:53, No significant change was found Confirmed by Randall An ([id number]) on 07/17/2023 1:21:59 PM       NST: 01/2019 No diagnostic ST changes to indicate ischemia. Small, mild intensity, partially reversible apical anterior defect that is  consistent with breast attenuation. This is a low risk study. Nuclear stress EF: 75%.  Event Monitor: 09/2020 [employer] monitor reviewed, 13 days 8 hours analyzed. Predominant rhythm is sinus with heart rate ranging from 77 bpm up to 146 bpm and average heart rate 96 bpm. Rare PACs were noted representing less than 1% total beats. Rare PVCs were noted representing less than 1% total beats. 3 beat episode of NSVT noted. Patient triggered event corresponded to normal sinus rhythm. There were no sustained arrhythmias or pauses.   Physical Exam:   VS:  BP 128/72   Pulse 90   Ht 5\' 4"  (1.626 m)   Wt 220 lb (99.8 kg)   SpO2 97%   BMI 37.76 kg/m    Wt Readings from Last 3 Encounters:  07/17/23 220 lb (99.8 kg)  07/09/23 222 lb 6.4 oz (100.9 kg)  06/26/23 221 lb 1.3 oz (100.3 kg)     GEN: Well nourished, well developed female appearing in no acute distress NECK: No JVD; No carotid bruits CARDIAC: RRR, no murmurs, rubs, gallops RESPIRATORY:  Clear to auscultation without rales, wheezing or rhonchi  ABDOMEN: Appears non-distended. No obvious abdominal masses. EXTREMITIES: No clubbing or cyanosis. No pitting edema.  Distal pedal pulses are 2+ bilaterally.   Assessment and Plan:   1. Palpitations - Prior monitor in 2022 showed rare PAC's and PVC's representing less than 1% of total beats along with brief NSVT. She reports symptoms have overall been well-controlled. We reviewed the importance of trying to reduce her caffeine intake to help with symptoms. Continue Coreg 9.375 mg twice daily.  2. HLD - Followed by her PCP.  FLP in 06/2023 showed total cholesterol 158, triglycerides 85, HDL 72 and LDL 70. Continue Atorvastatin 10mg  daily.   3. HTN - Blood pressure is well-controlled at 128/72 during today's visit. Continue current medical therapy with Amlodipine 5 mg daily, Coreg 9.375 mg twice daily, Spironolactone 25 mg daily and Clonidine 0.2 mg at bedtime (on this per her PCP for  insomnia).  4. Lower Extremity Edema - Symptoms have overall been well-controlled. She remains on Spironolactone 25 mg daily. Creatinine was stable at 0.95 when checked in 06/2023.  Signed, Ellsworth Lennox, PA-C

## 2023-07-17 NOTE — Patient Instructions (Signed)
Medication Instructions:  Your physician recommends that you continue on your current medications as directed. Please refer to the Current Medication list given to you today.   Labwork: None today  Testing/Procedures: None today  Follow-Up: 1 year  Any Other Special Instructions Will Be Listed Below (If Applicable).  If you need a refill on your cardiac medications before your next appointment, please call your pharmacy.  

## 2023-07-30 ENCOUNTER — Other Ambulatory Visit: Payer: Self-pay | Admitting: Family Medicine

## 2023-07-30 DIAGNOSIS — K219 Gastro-esophageal reflux disease without esophagitis: Secondary | ICD-10-CM

## 2023-08-18 ENCOUNTER — Other Ambulatory Visit: Payer: Self-pay | Admitting: Family Medicine

## 2023-08-18 ENCOUNTER — Other Ambulatory Visit: Payer: Self-pay | Admitting: Student

## 2023-08-22 ENCOUNTER — Ambulatory Visit (HOSPITAL_COMMUNITY)
Admission: RE | Admit: 2023-08-22 | Discharge: 2023-08-22 | Disposition: A | Discharge status: Home / Self Care | Payer: Medicare PPO | Source: Ambulatory Visit | Attending: Family Medicine | Admitting: Family Medicine

## 2023-08-22 DIAGNOSIS — Z1231 Encounter for screening mammogram for malignant neoplasm of breast: Secondary | ICD-10-CM | POA: Insufficient documentation

## 2023-08-24 ENCOUNTER — Other Ambulatory Visit (HOSPITAL_COMMUNITY): Payer: Self-pay | Admitting: Family Medicine

## 2023-08-24 DIAGNOSIS — R928 Other abnormal and inconclusive findings on diagnostic imaging of breast: Secondary | ICD-10-CM

## 2023-09-13 ENCOUNTER — Ambulatory Visit (HOSPITAL_COMMUNITY)
Admission: RE | Admit: 2023-09-13 | Discharge: 2023-09-13 | Disposition: A | Discharge status: Home / Self Care | Payer: Medicare PPO | Source: Ambulatory Visit | Attending: Family Medicine | Admitting: Family Medicine

## 2023-09-13 DIAGNOSIS — R928 Other abnormal and inconclusive findings on diagnostic imaging of breast: Secondary | ICD-10-CM | POA: Insufficient documentation

## 2023-09-13 DIAGNOSIS — R92322 Mammographic fibroglandular density, left breast: Secondary | ICD-10-CM | POA: Diagnosis not present

## 2023-09-14 ENCOUNTER — Other Ambulatory Visit (HOSPITAL_COMMUNITY): Payer: Self-pay | Admitting: Family Medicine

## 2023-09-14 DIAGNOSIS — R928 Other abnormal and inconclusive findings on diagnostic imaging of breast: Secondary | ICD-10-CM

## 2023-09-19 ENCOUNTER — Ambulatory Visit
Admission: RE | Admit: 2023-09-19 | Discharge: 2023-09-19 | Disposition: A | Discharge status: Home / Self Care | Payer: Medicare PPO | Source: Ambulatory Visit | Attending: Family Medicine | Admitting: Family Medicine

## 2023-09-19 DIAGNOSIS — R928 Other abnormal and inconclusive findings on diagnostic imaging of breast: Secondary | ICD-10-CM

## 2023-09-19 DIAGNOSIS — N6012 Diffuse cystic mastopathy of left breast: Secondary | ICD-10-CM | POA: Diagnosis not present

## 2023-09-19 DIAGNOSIS — N6325 Unspecified lump in the left breast, overlapping quadrants: Secondary | ICD-10-CM | POA: Diagnosis not present

## 2023-09-19 HISTORY — PX: BREAST BIOPSY: SHX20

## 2023-09-20 ENCOUNTER — Ambulatory Visit (INDEPENDENT_AMBULATORY_CARE_PROVIDER_SITE_OTHER): Payer: Medicare PPO | Admitting: Family Medicine

## 2023-09-20 ENCOUNTER — Encounter: Payer: Self-pay | Admitting: Family Medicine

## 2023-09-20 VITALS — BP 110/72 | HR 106 | Ht 64.0 in | Wt 221.1 lb

## 2023-09-20 DIAGNOSIS — I10 Essential (primary) hypertension: Secondary | ICD-10-CM | POA: Diagnosis not present

## 2023-09-20 DIAGNOSIS — B369 Superficial mycosis, unspecified: Secondary | ICD-10-CM | POA: Diagnosis not present

## 2023-09-20 DIAGNOSIS — L299 Pruritus, unspecified: Secondary | ICD-10-CM | POA: Diagnosis not present

## 2023-09-20 DIAGNOSIS — F418 Other specified anxiety disorders: Secondary | ICD-10-CM

## 2023-09-20 DIAGNOSIS — E785 Hyperlipidemia, unspecified: Secondary | ICD-10-CM

## 2023-09-20 DIAGNOSIS — N898 Other specified noninflammatory disorders of vagina: Secondary | ICD-10-CM | POA: Diagnosis not present

## 2023-09-20 DIAGNOSIS — R7303 Prediabetes: Secondary | ICD-10-CM | POA: Diagnosis not present

## 2023-09-20 LAB — SURGICAL PATHOLOGY

## 2023-09-20 MED ORDER — TERBINAFINE HCL 250 MG PO TABS: 250.0000 mg | ORAL_TABLET | Freq: Every day | ORAL | 0 refills | Status: DC

## 2023-09-20 MED ORDER — PREDNISONE 5 MG PO TABS: 5.0000 mg | ORAL_TABLET | Freq: Two times a day (BID) | ORAL | 0 refills | Status: AC

## 2023-09-20 MED ORDER — FLUCONAZOLE 100 MG PO TABS: 100.0000 mg | ORAL_TABLET | Freq: Every day | ORAL | 0 refills | Status: DC

## 2023-09-20 NOTE — Patient Instructions (Addendum)
F/U in 13 weeks call if you need me sooner, will make sooner appt if you are diabetic and need to start medication  Tablets are prescribed for itching, terbinafine, fluconazole and prednisone  You may use OTC hydrocortisone cream sparingly for localized itching  You ar being referred back to [facility]  HBA1c, cmp and EGFr, TSH non fasting  to be drawn 01/23 or shortly after  It is important that you exercise regularly at least 30 minutes 5 times a week. If you develop chest pain, have severe difficulty breathing, or feel very tired, stop exercising immediately and seek medical attention   You will be referred back to nutrition important that you return call to schedule appointment , I will ask that you MOBILE number be used

## 2023-09-30 ENCOUNTER — Encounter: Payer: Self-pay | Admitting: Family Medicine

## 2023-09-30 DIAGNOSIS — B369 Superficial mycosis, unspecified: Secondary | ICD-10-CM | POA: Insufficient documentation

## 2023-09-30 DIAGNOSIS — L299 Pruritus, unspecified: Secondary | ICD-10-CM | POA: Insufficient documentation

## 2023-09-30 NOTE — Assessment & Plan Note (Signed)
Generalized x 1 week, no new products , foods or meds, no significant rash, no fever or chills

## 2023-09-30 NOTE — Assessment & Plan Note (Signed)
DASH diet and commitment to daily physical activity for a minimum of 30 minutes discussed and encouraged, as a part of hypertension management. The importance of attaining a healthy weight is also discussed.     09/20/2023   11:24 AM 09/20/2023   10:51 AM 07/17/2023    1:14 PM 07/09/2023    9:58 AM 06/26/2023   10:41 AM 06/26/2023   10:33 AM 06/26/2023    9:44 AM  BP/Weight  Systolic BP 110 138 128 133 120 128 135  Diastolic BP 72 85 72 81 82 84 84  Wt. (Lbs)  221.08 220 222.4   221.08  BMI  37.95 kg/m2 37.76 kg/m2 38.17 kg/m2   37.95 kg/m2     Controlled, no change in medication

## 2023-09-30 NOTE — Progress Notes (Signed)
Alexis Hurst     MRN: [id number]      DOB: June 27, 1951  Chief Complaint  Patient presents with   Follow-up    Follow up    HPI Alexis Hurst is here for follow up and re-evaluation of chronic medical conditions, medication management and review of any available recent lab and radiology data.  Preventive health is updated, specifically  Cancer screening and Immunization.   Questions or concerns regarding consultations or procedures which the PT has had in the interim are  addressed. The PT denies any adverse reactions to current medications since the last visit.  1 week h/o generalized uncontrolled itching, scalp, under armpits, groin, intra vaginal, states old prednisone tablet she had was beneficial No purulent drainage , fever or chills ,  no visible rash or redness, denies increased or uncontrolled anxiety   ROS Denies recent fever or chills. Denies sinus pressure, nasal congestion, ear pain or sore throat. Denies chest congestion, productive cough or wheezing. Denies chest pains, palpitations and leg swelling Denies abdominal pain, nausea, vomiting,diarrhea or constipation.   Denies dysuria, frequency, hesitancy or incontinence. Denies joint pain, swelling and limitation in mobility. Denies headaches, seizures, numbness, or tingling. Denies uncontrolled  depression, anxiety or insomnia.  PE  BP 110/72   Pulse (!) 106   Ht 5\' 4"  (1.626 m)   Wt 221 lb 1.3 oz (100.3 kg)   SpO2 94%   BMI 37.95 kg/m   Patient alert and oriented and in no cardiopulmonary distress.  HEENT: No facial asymmetry, EOMI,     Neck supple .  Chest: Clear to auscultation bilaterally.  CVS: S1, S2 no murmurs, no S3.Regular rate.  ABD: Soft non tender.   Ext: No edema  MS: Adequate ROM spine, shoulders, hips and knees.  Skin: Intact, macular  rash noted.  Psych: Good eye contact, normal affect. Memory intact mildly  anxious , not  depressed appearing.  CNS: CN 2-12 intact, power,   normal throughout.no focal deficits noted.   Assessment & Plan  Dermatomycosis Generalized itching with scant rash, empiric fluconazole and terbinafine and prednisone short trm with topical hydrocortisone sparingly  Essential hypertension DASH diet and commitment to daily physical activity for a minimum of 30 minutes discussed and encouraged, as a part of hypertension management. The importance of attaining a healthy weight is also discussed.     09/20/2023   11:24 AM 09/20/2023   10:51 AM 07/17/2023    1:14 PM 07/09/2023    9:58 AM 06/26/2023   10:41 AM 06/26/2023   10:33 AM 06/26/2023    9:44 AM  BP/Weight  Systolic BP 110 138 128 133 120 128 135  Diastolic BP 72 85 72 81 82 84 84  Wt. (Lbs)  221.08 220 222.4   221.08  BMI  37.95 kg/m2 37.76 kg/m2 38.17 kg/m2   37.95 kg/m2     Controlled, no change in medication   Depression with anxiety Controlled, no change in medication   Morbid obesity (HCC)  Patient re-educated about  the importance of commitment to a  minimum of 150 minutes of exercise per week as able.  The importance of healthy food choices with portion control discussed, as well as eating regularly and within a 12 hour window most days. The need to choose "clean , green" food 50 to 75% of the time is discussed, as well as to make water the primary drink and set a goal of 64 ounces water daily.  09/20/2023   10:51 AM 07/17/2023    1:14 PM 07/09/2023    9:58 AM  Weight /BMI  Weight 221 lb 1.3 oz 220 lb 222 lb 6.4 oz  Height 5\' 4"  (1.626 m) 5\' 4"  (1.626 m) 5\' 4"  (1.626 m)  BMI 37.95 kg/m2 37.76 kg/m2 38.17 kg/m2    Obesity associated with hTN , PDB, depression, unchanged  Vaginal itching Reports increased itching intravaginall, no c/o discharge , refer back to Gyne for re eval  Itching Generalized x 1 week, no new products , foods or meds, no significant rash, no fever or chills

## 2023-09-30 NOTE — Assessment & Plan Note (Signed)
Reports increased itching intravaginall, no c/o discharge , refer back to [facility] for re eval

## 2023-09-30 NOTE — Assessment & Plan Note (Signed)
  Patient re-educated about  the importance of commitment to a  minimum of 150 minutes of exercise per week as able.  The importance of healthy food choices with portion control discussed, as well as eating regularly and within a 12 hour window most days. The need to choose "clean , green" food 50 to 75% of the time is discussed, as well as to make water the primary drink and set a goal of 64 ounces water daily.       09/20/2023   10:51 AM 07/17/2023    1:14 PM 07/09/2023    9:58 AM  Weight /BMI  Weight 221 lb 1.3 oz 220 lb 222 lb 6.4 oz  Height 5\' 4"  (1.626 m) 5\' 4"  (1.626 m) 5\' 4"  (1.626 m)  BMI 37.95 kg/m2 37.76 kg/m2 38.17 kg/m2    Obesity associated with hTN , PDB, depression, unchanged

## 2023-09-30 NOTE — Assessment & Plan Note (Signed)
Generalized itching with scant rash, empiric fluconazole and terbinafine and prednisone short trm with topical hydrocortisone sparingly

## 2023-09-30 NOTE — Assessment & Plan Note (Signed)
Controlled, no change in medication

## 2023-10-01 ENCOUNTER — Ambulatory Visit: Payer: Medicare PPO | Admitting: Adult Health

## 2023-10-01 ENCOUNTER — Encounter: Payer: Self-pay | Admitting: Adult Health

## 2023-10-01 VITALS — BP 146/90 | HR 103 | Ht 64.0 in | Wt 220.0 lb

## 2023-10-01 DIAGNOSIS — L900 Lichen sclerosus et atrophicus: Secondary | ICD-10-CM

## 2023-10-01 DIAGNOSIS — L80 Vitiligo: Secondary | ICD-10-CM | POA: Diagnosis not present

## 2023-10-01 DIAGNOSIS — I10 Essential (primary) hypertension: Secondary | ICD-10-CM | POA: Diagnosis not present

## 2023-10-01 DIAGNOSIS — N898 Other specified noninflammatory disorders of vagina: Secondary | ICD-10-CM

## 2023-10-01 NOTE — Progress Notes (Signed)
  Subjective:     Patient ID: Alexis Hurst, female   DOB: 05-03-1951, 73 y.o.   MRN: [id number]  HPI Theola is a 73 year old black female, married, sp hysterectomy in complaining of itching in vaginal area, mons pubis and even butt crack. She said head even itches at times. She has seen Dr Lodema Hong and was treated with prednisone, fluconazole and terbinafine, with out full relief. She has know lichen sclerosus.  PCP is Dr Lodema Hong.  Review of Systems +itching in vaginal area, mons pubis and even butt crack. She said head even itches at times    Reviewed past medical,surgical, social and family history. Reviewed medications and allergies.  Objective:   Physical Exam BP (!) 146/90 (BP Location: Right Arm, Patient Position: Sitting, Cuff Size: Normal)   Pulse (!) 103   Ht 5\' 4"  (1.626 m)   Wt 220 lb (99.8 kg)   BMI 37.76 kg/m     Skin warm and dry.Pelvic: external genitalia is normal in appearance no lesions, has vitiligo,vagina: pale with loss of rugae, tissue is what and thicker about clitoral area, and thinner and slightly red at introitus, urethra has no lesions or masses noted, cervix and uterus are absent, adnexa: no masses or tenderness noted. Bladder is non tender and no masses felt. No lesions or irritation of butt crack.  [facility] - 10/01/23 1118       Pregnancy Intention Screening   Does the patient want to become pregnant in the next year? N/A    Does the patient's partner want to become pregnant in the next year? N/A    Would the patient like to discuss contraceptive options today? N/A      Contraception Wrap Up   Current Method Female Sterilization   hyst   End Method Female Sterilization   hyst   Contraception Counseling Provided No             Assessment:     1. Itching in the vaginal area (Primary) Try Aquaphor 3N1 on mons pubis and butt crack to see if helps   2. Lichen sclerosus et atrophicus Use temovate 2-3 x weekly  3. Vitiligo  4. Essential  hypertension Take meds and follow up with PCP    Plan:     Follow up in 2 weeks for recheck and ROS

## 2023-10-04 ENCOUNTER — Other Ambulatory Visit: Payer: Self-pay | Admitting: Family Medicine

## 2023-10-15 ENCOUNTER — Encounter: Payer: Self-pay | Admitting: Adult Health

## 2023-10-15 ENCOUNTER — Ambulatory Visit: Payer: Medicare PPO | Admitting: Adult Health

## 2023-10-15 VITALS — BP 155/84 | HR 102 | Ht 64.0 in | Wt 220.0 lb

## 2023-10-15 DIAGNOSIS — N898 Other specified noninflammatory disorders of vagina: Secondary | ICD-10-CM | POA: Diagnosis not present

## 2023-10-15 DIAGNOSIS — L80 Vitiligo: Secondary | ICD-10-CM | POA: Diagnosis not present

## 2023-10-15 DIAGNOSIS — I10 Essential (primary) hypertension: Secondary | ICD-10-CM

## 2023-10-15 DIAGNOSIS — L900 Lichen sclerosus et atrophicus: Secondary | ICD-10-CM | POA: Diagnosis not present

## 2023-10-15 DIAGNOSIS — R7303 Prediabetes: Secondary | ICD-10-CM | POA: Diagnosis not present

## 2023-10-15 NOTE — Progress Notes (Signed)
  Subjective:     Patient ID: Alexis Hurst, female   DOB: 02-22-1951, 73 y.o.   MRN: [id number]  HPI [name] is a 73 year old black female, married, sp hysterectomy back in follow up on itching in vaginal area and irritation and it has resolved. She said Aqua 3N1 did the trick. Feels much better.  PCP is Dr Rodolph Clap   Review of Systems Itching has resolved Reviewed past medical,surgical, social and family history. Reviewed medications and allergies.     Objective:   Physical Exam BP (!) 155/84 (BP Location: Right Arm, Patient Position: Sitting, Cuff Size: Normal)   Pulse (!) 102   Ht 5\' 4"  (1.626 m)   Wt 220 lb (99.8 kg)   BMI 37.76 kg/m      Skin warm and dry.Pelvic: external genitalia is normal in appearance no lesions, has vitiligo,vagina: pale with loss of rugae, tissue is thicker at clitoral area, but not red. Examination chaperoned by Alphonso Aschoff LPN  [facility] - 10/15/23 1048       Pregnancy Intention Screening   Does the patient want to become pregnant in the next year? N/A    Does the patient's partner want to become pregnant in the next year? N/A    Would the patient like to discuss contraceptive options today? N/A      Contraception Wrap Up   Current Method Female Sterilization   hyst   End Method Female Sterilization   hyst   Contraception Counseling Provided No             Assessment:     1. Itching in the vaginal area (Primary), resolved  2. Lichen sclerosus et atrophicus Use temovate  2 x weekly  3. Vitiligo   4. Essential hypertension Take BP meds and follow up with PCP      Plan:     Follow up prn

## 2023-10-16 ENCOUNTER — Other Ambulatory Visit: Payer: Self-pay | Admitting: Family Medicine

## 2023-10-16 ENCOUNTER — Encounter: Payer: Self-pay | Admitting: Family Medicine

## 2023-10-16 LAB — CMP14+EGFR
ALT: 23 [IU]/L (ref 0–32)
AST: 19 [IU]/L (ref 0–40)
Albumin: 4 g/dL (ref 3.9–4.9)
Alkaline Phosphatase: 123 [IU]/L — ABNORMAL HIGH (ref 44–121)
BUN/Creatinine Ratio: 8 — ABNORMAL LOW (ref 12–28)
BUN: 7 mg/dL — ABNORMAL LOW (ref 8–27)
Bilirubin Total: 0.3 mg/dL (ref 0.0–1.2)
CO2: 23 mmol/L (ref 20–29)
Calcium: 8.8 mg/dL (ref 8.7–10.3)
Chloride: 105 mmol/L (ref 96–106)
Creatinine, Ser: 0.86 mg/dL (ref 0.57–1.00)
Globulin, Total: 2.7 g/dL (ref 1.5–4.5)
Glucose: 120 mg/dL — ABNORMAL HIGH (ref 70–99)
Potassium: 3.6 mmol/L (ref 3.5–5.2)
Sodium: 143 mmol/L (ref 134–144)
Total Protein: 6.7 g/dL (ref 6.0–8.5)
eGFR: 73 mL/min/{1.73_m2} (ref >59)

## 2023-10-16 LAB — HEMOGLOBIN A1C
Est. average glucose Bld gHb Est-mCnc: 140 mg/dL
Hgb A1c MFr Bld: 6.5 % — ABNORMAL HIGH (ref 4.8–5.6)

## 2023-10-16 LAB — TSH: TSH: 1.42 u[IU]/mL (ref 0.450–4.500)

## 2023-10-16 MED ORDER — MYRBETRIQ 25 MG PO TB24: 25.0000 mg | ORAL_TABLET | Freq: Every day | ORAL | 11 refills | Status: DC

## 2023-10-16 MED ORDER — TIRZEPATIDE 2.5 MG/0.5ML ~~LOC~~ SOAJ: 2.5000 mg | SUBCUTANEOUS | 0 refills | Status: AC

## 2023-10-16 MED ORDER — TIRZEPATIDE 5 MG/0.5ML ~~LOC~~ SOAJ: 5.0000 mg | SUBCUTANEOUS | 2 refills | Status: DC

## 2023-10-16 NOTE — Addendum Note (Signed)
Addended by: Kerri Perches on: 10/16/2023 06:10 PM   Modules accepted: Orders

## 2023-11-12 ENCOUNTER — Other Ambulatory Visit: Payer: Self-pay | Admitting: Family Medicine

## 2023-11-13 ENCOUNTER — Telehealth: Payer: Self-pay | Admitting: Pharmacy Technician

## 2023-11-13 ENCOUNTER — Encounter: Payer: Self-pay | Admitting: Family Medicine

## 2023-11-13 ENCOUNTER — Other Ambulatory Visit (HOSPITAL_COMMUNITY): Payer: Self-pay

## 2023-11-13 NOTE — Telephone Encounter (Signed)
 Pharmacy Patient Advocate Encounter   Received notification from Onbase that prior authorization for MIRABEGRON ER 25MG  TABLETS is required/requested.   Insurance verification completed.   The patient is insured through [employer] .   Per test claim:  BRAND NAME [id number] is preferred by the insurance.  If suggested medication is appropriate, Please send in a new RX and discontinue this one. If not, please advise as to why it's not appropriate so that we may request a Prior Authorization. Please note, some preferred medications may still require a PA.  If the suggested medications have not been trialed and there are no contraindications to their use, the PA will not be submitted, as it will not be approved.  Called [facility] and had them reprocess claim as brand preferred.

## 2023-11-26 ENCOUNTER — Other Ambulatory Visit: Payer: Self-pay | Admitting: Family Medicine

## 2023-11-28 ENCOUNTER — Other Ambulatory Visit: Payer: Self-pay | Admitting: Family Medicine

## 2023-12-05 ENCOUNTER — Ambulatory Visit: Admitting: Family Medicine

## 2023-12-05 VITALS — BP 127/83 | HR 96 | Resp 16 | Ht 64.0 in | Wt 215.1 lb

## 2023-12-05 DIAGNOSIS — E1169 Type 2 diabetes mellitus with other specified complication: Secondary | ICD-10-CM | POA: Diagnosis not present

## 2023-12-05 DIAGNOSIS — Z9189 Other specified personal risk factors, not elsewhere classified: Secondary | ICD-10-CM

## 2023-12-05 DIAGNOSIS — R0683 Snoring: Secondary | ICD-10-CM | POA: Diagnosis not present

## 2023-12-05 DIAGNOSIS — I10 Essential (primary) hypertension: Secondary | ICD-10-CM | POA: Diagnosis not present

## 2023-12-05 DIAGNOSIS — F418 Other specified anxiety disorders: Secondary | ICD-10-CM

## 2023-12-05 DIAGNOSIS — R0789 Other chest pain: Secondary | ICD-10-CM | POA: Diagnosis not present

## 2023-12-05 DIAGNOSIS — L659 Nonscarring hair loss, unspecified: Secondary | ICD-10-CM

## 2023-12-05 DIAGNOSIS — T733XXS Exhaustion due to excessive exertion, sequela: Secondary | ICD-10-CM | POA: Diagnosis not present

## 2023-12-05 DIAGNOSIS — R002 Palpitations: Secondary | ICD-10-CM

## 2023-12-05 MED ORDER — FLUOXETINE HCL 20 MG PO CAPS
20.0000 mg | ORAL_CAPSULE | Freq: Every day | ORAL | 3 refills | Status: DC
Start: 2023-12-05 — End: 2024-07-24

## 2023-12-05 MED ORDER — TIRZEPATIDE 7.5 MG/0.5ML ~~LOC~~ SOAJ: 7.5000 mg | SUBCUTANEOUS | 3 refills | Status: DC

## 2023-12-05 NOTE — Assessment & Plan Note (Addendum)
 Increased in frequency  and worsening in past 2 to 3 [facility] Cardioology

## 2023-12-05 NOTE — Patient Instructions (Addendum)
 F/U in 3 months call if you need me sooner  New higher dose of mounjaro to be started next week  PLease schedule appointment for pulmonary at checkout, re sleep apnea evaluation  You are referred to Cardiology for evaluation of fatigue, intermittent chest tightness and palpitations  New higher dose of fluoxetine 60 mg daily  Thanks for choosing [facility], we consider it a privelige to serve you.

## 2023-12-05 NOTE — Assessment & Plan Note (Addendum)
 Snoring and fatigue, refer for slepp eval

## 2023-12-09 ENCOUNTER — Encounter: Payer: Self-pay | Admitting: Family Medicine

## 2023-12-09 DIAGNOSIS — R0789 Other chest pain: Secondary | ICD-10-CM | POA: Insufficient documentation

## 2023-12-09 DIAGNOSIS — Z9189 Other specified personal risk factors, not elsewhere classified: Secondary | ICD-10-CM | POA: Insufficient documentation

## 2023-12-09 DIAGNOSIS — E1169 Type 2 diabetes mellitus with other specified complication: Secondary | ICD-10-CM | POA: Insufficient documentation

## 2023-12-09 DIAGNOSIS — L659 Nonscarring hair loss, unspecified: Secondary | ICD-10-CM | POA: Insufficient documentation

## 2023-12-09 NOTE — Assessment & Plan Note (Signed)
 Increase fluoxetine dose to 60 mg, recommend Therapy but not interested at this time

## 2023-12-09 NOTE — Assessment & Plan Note (Signed)
  Patient re-educated about  the importance of commitment to a  minimum of 150 minutes of exercise per week as able.  The importance of healthy food choices with portion control discussed, as well as eating regularly and within a 12 hour window most days. The need to choose "clean , green" food 50 to 75% of the time is discussed, as well as to make water the primary drink and set a goal of 64 ounces water daily.       12/05/2023    9:42 AM 10/15/2023   10:46 AM 10/01/2023   11:11 AM  Weight /BMI  Weight 215 lb 1.9 oz 220 lb 220 lb  Height 5\' 4"  (1.626 m) 5\' 4"  (1.626 m) 5\' 4"  (1.626 m)  BMI 36.93 kg/m2 37.76 kg/m2 37.76 kg/m2    Decreased in weight , which is good, inc mounjaro to 7.5 mg weekly dose when next due

## 2023-12-09 NOTE — Progress Notes (Signed)
 Alexis Hurst     MRN: [id number]      DOB: 23-Oct-1950  Chief Complaint  Patient presents with   Hair/Scalp Problem    States she has been experiencing significant hairloss since September. Also states she has been having low energy and problems concentrating for the last few months.     HPI Alexis Hurst is here for follow up and re-evaluation of chronic medical conditions, medication management and review of any available recent lab and radiology data.  Preventive health is updated, specifically  Cancer screening and Immunization.   Questions or concerns regarding consultations or procedures which the PT has had in the interim are  addressed. The PT denies any adverse reactions to current medications since the last visit.  New concerns as stated above as well as additional ones in HPI   ROS Denies recent fever or chills. Denies sinus pressure, nasal congestion, ear pain or sore throat. Denies chest congestion, productive cough or wheezing.  Denies abdominal pain, nausea, vomiting,diarrhea or constipation.   Denies dysuria, frequency, hesitancy or incontinence. Denies joint pain, swelling and limitation in mobility. Denies headaches, seizures, numbness, or tingling. C/o uncontrolled  depression, and anxiety . Denies skin break down or rash.6 month h/o hair loss   PE  BP 127/83   Pulse 96   Resp 16   Ht 5\' 4"  (1.626 m)   Wt 215 lb 1.9 oz (97.6 kg)   SpO2 97%   BMI 36.93 kg/m   Patient alert and oriented and in no cardiopulmonary distress.  HEENT: No facial asymmetry, EOMI,     Neck supple .  Chest: Clear to auscultation bilaterally.  CVS: S1, S2 no murmurs, no S3.Regular rate.    Ext: No edema  MS: Adequate ROM spine, shoulders, hips and knees.  Skin: Intact, no ulcerations or rash noted.  Psych: Good eye contact, flat affect. Memory intact mildly  anxious and  depressed appearing.  CNS: CN 2-12 intact, power,  normal throughout.no focal deficits  noted.   Assessment & Plan Snoring Snoring and fatigue, refer for slepp eval  Fatigue due to excessive exertion Increased in frequency  and worsening in past 2 to 3 monthsrefer Cardioology  Chest tightness Reports increased episodes in last 3 months, with or without activity, non radiaiting , associated with increased fatigue with minimal exertion  Essential hypertension Controlled, no change in medication   Palpitations Reports increased frequency x3 months, sometimes feels light headed , reports increased fatigue  Depression with anxiety Increase fluoxetine dose to 60 mg, recommend Therapy but not interested at this time  Morbid obesity [facility])  Patient re-educated about  the importance of commitment to a  minimum of 150 minutes of exercise per week as able.  The importance of healthy food choices with portion control discussed, as well as eating regularly and within a 12 hour window most days. The need to choose "clean , green" food 50 to 75% of the time is discussed, as well as to make water the primary drink and set a goal of 64 ounces water daily.       12/05/2023    9:42 AM 10/15/2023   10:46 AM 10/01/2023   11:11 AM  Weight /BMI  Weight 215 lb 1.9 oz 220 lb 220 lb  Height 5\' 4"  (1.626 m) 5\' 4"  (1.626 m) 5\' 4"  (1.626 m)  BMI 36.93 kg/m2 37.76 kg/m2 37.76 kg/m2    Decreased in weight , which is good, inc mounjaro to 7.5 mg weekly  dose when next due  At increased risk for cardiovascular disease Multiple cardiac risk factors refer for re evaluation especially with current copmplaints  Hair loss Noted in past 6 monhts in particular, though amlodipine may cause hair loss , noiinterest in d/c the drug, stress and aging play a bigger role in the problem . No topical rogaine, will begentle on hair as in no chemicals to hair  Type 2 diabetes mellitus with other specified complication (HCC) Diabetes associated with hypertension, hyperlipidemia, obesity, and depression  Ms.  Hurst is reminded of the importance of commitment to daily physical activity for 30 minutes or more, as able and the need to limit carbohydrate intake to 30 to 60 grams per meal to help with blood sugar control.   The need to take medication as prescribed, test blood sugar as directed, and to call between visits if there is a concern that blood sugar is uncontrolled is also discussed.   Alexis Hurst is reminded of the importance of daily foot exam, annual eye examination, and good blood sugar, blood pressure and cholesterol control.     Latest Ref Rng & Units 10/15/2023   10:15 AM 06/26/2023   10:47 AM 02/23/2023   11:13 AM 01/30/2023    4:11 PM 01/30/2023    2:30 PM  Diabetic Labs  HbA1c 4.8 - 5.6 % 6.5  6.4    6.4   Chol 100 - 199 mg/dL  578   469    HDL >62 mg/dL  72   76    Calc LDL 0 - 99 mg/dL  70   76    Triglycerides 0 - 149 mg/dL  85   92    Creatinine 0.57 - 1.00 mg/dL 9.52  8.41  3.24         12/05/2023    9:42 AM 10/15/2023   11:01 AM 10/15/2023   10:46 AM 10/01/2023   11:37 AM 10/01/2023   11:11 AM 09/20/2023   11:24 AM 09/20/2023   10:51 AM  BP/Weight  Systolic BP 127 155 142 146 147 110 138  Diastolic BP 83 84 87 90 88 72 85  Wt. (Lbs) 215.12  220  220  221.08  BMI 36.93 kg/m2  37.76 kg/m2  37.76 kg/m2  37.95 kg/m2      Latest Ref Rng & Units 01/22/2023   12:00 AM 01/18/2021   12:00 AM  Foot/eye exam completion dates  Eye Exam No Retinopathy No Retinopathy     No Retinopathy         This result is from an external source.

## 2023-12-09 NOTE — Assessment & Plan Note (Signed)
 Diabetes associated with hypertension, hyperlipidemia, obesity, and depression  Alexis Hurst is reminded of the importance of commitment to daily physical activity for 30 minutes or more, as able and the need to limit carbohydrate intake to 30 to 60 grams per meal to help with blood sugar control.   The need to take medication as prescribed, test blood sugar as directed, and to call between visits if there is a concern that blood sugar is uncontrolled is also discussed.   Alexis Hurst is reminded of the importance of daily foot exam, annual eye examination, and good blood sugar, blood pressure and cholesterol control.     Latest Ref Rng & Units 10/15/2023   10:15 AM 06/26/2023   10:47 AM 02/23/2023   11:13 AM 01/30/2023    4:11 PM 01/30/2023    2:30 PM  Diabetic Labs  HbA1c 4.8 - 5.6 % 6.5  6.4    6.4   Chol 100 - 199 mg/dL  130   865    HDL >78 mg/dL  72   76    Calc LDL 0 - 99 mg/dL  70   76    Triglycerides 0 - 149 mg/dL  85   92    Creatinine 0.57 - 1.00 mg/dL 4.69  6.29  5.28         12/05/2023    9:42 AM 10/15/2023   11:01 AM 10/15/2023   10:46 AM 10/01/2023   11:37 AM 10/01/2023   11:11 AM 09/20/2023   11:24 AM 09/20/2023   10:51 AM  BP/Weight  Systolic BP 127 155 142 146 147 110 138  Diastolic BP 83 84 87 90 88 72 85  Wt. (Lbs) 215.12  220  220  221.08  BMI 36.93 kg/m2  37.76 kg/m2  37.76 kg/m2  37.95 kg/m2      Latest Ref Rng & Units 01/22/2023   12:00 AM 01/18/2021   12:00 AM  Foot/eye exam completion dates  Eye Exam No Retinopathy No Retinopathy     No Retinopathy         This result is from an external source.

## 2023-12-09 NOTE — Assessment & Plan Note (Signed)
 Reports increased episodes in last 3 months, with or without activity, non radiaiting , associated with increased fatigue with minimal exertion

## 2023-12-09 NOTE — Assessment & Plan Note (Signed)
 Multiple cardiac risk factors refer for re evaluation especially with current copmplaints

## 2023-12-09 NOTE — Assessment & Plan Note (Signed)
 Controlled, no change in medication

## 2023-12-09 NOTE — Assessment & Plan Note (Signed)
 Noted in past 6 monhts in particular, though amlodipine may cause hair loss , noiinterest in d/c the drug, stress and aging play a bigger role in the problem . No topical rogaine, will begentle on hair as in no chemicals to hair

## 2023-12-09 NOTE — Assessment & Plan Note (Signed)
 Reports increased frequency x3 months, sometimes feels light headed , reports increased fatigue

## 2023-12-18 ENCOUNTER — Other Ambulatory Visit: Payer: Self-pay | Admitting: Family Medicine

## 2023-12-25 ENCOUNTER — Ambulatory Visit: Payer: Medicare PPO | Admitting: Family Medicine

## 2024-01-02 ENCOUNTER — Ambulatory Visit: Payer: Medicare PPO

## 2024-01-18 ENCOUNTER — Ambulatory Visit: Admitting: Family Medicine

## 2024-01-25 DIAGNOSIS — H02831 Dermatochalasis of right upper eyelid: Secondary | ICD-10-CM | POA: Diagnosis not present

## 2024-01-25 DIAGNOSIS — H35363 Drusen (degenerative) of macula, bilateral: Secondary | ICD-10-CM | POA: Diagnosis not present

## 2024-01-25 DIAGNOSIS — H524 Presbyopia: Secondary | ICD-10-CM | POA: Diagnosis not present

## 2024-01-25 DIAGNOSIS — E119 Type 2 diabetes mellitus without complications: Secondary | ICD-10-CM | POA: Diagnosis not present

## 2024-01-25 DIAGNOSIS — H04123 Dry eye syndrome of bilateral lacrimal glands: Secondary | ICD-10-CM | POA: Diagnosis not present

## 2024-01-30 ENCOUNTER — Encounter: Payer: Self-pay | Admitting: Student

## 2024-01-30 ENCOUNTER — Ambulatory Visit: Attending: Student | Admitting: Student

## 2024-01-30 VITALS — BP 130/84 | HR 97 | Ht 64.0 in | Wt 206.6 lb

## 2024-01-30 DIAGNOSIS — R0609 Other forms of dyspnea: Secondary | ICD-10-CM | POA: Diagnosis not present

## 2024-01-30 DIAGNOSIS — E785 Hyperlipidemia, unspecified: Secondary | ICD-10-CM | POA: Diagnosis not present

## 2024-01-30 DIAGNOSIS — I10 Essential (primary) hypertension: Secondary | ICD-10-CM

## 2024-01-30 DIAGNOSIS — R002 Palpitations: Secondary | ICD-10-CM | POA: Diagnosis not present

## 2024-01-30 DIAGNOSIS — R079 Chest pain, unspecified: Secondary | ICD-10-CM

## 2024-01-30 MED ORDER — METOPROLOL TARTRATE 100 MG PO TABS: 100.0000 mg | ORAL_TABLET | ORAL | 0 refills | Status: DC

## 2024-01-30 NOTE — Progress Notes (Signed)
 Cardiology Office Note    Date:  01/30/2024  ID:  Hurst, Alexis 12-02-1950, MRN [id number] Cardiologist: Teddie Favre, MD    History of Present Illness:    Alexis Hurst is a 73 y.o. female with past medical history of palpitations (PAC's, PVC's and brief NSVT by prior monitor in 09/2020), HTN, HLD, GERD and prediabetes who presents to the office today for evaluation of worsening fatigue and chest tightness.  She was examined by myself in 07/2023 and was overall doing well at that time and reported her palpitations were occurring infrequently and typically in the setting of caffeine consumption. She was continued on her current cardiac medications with Amlodipine  5 mg daily, ASA 81 mg daily, Atorvastatin  10 mg daily, Coreg  9.375 mg twice daily, Clonidine  0.2 mg daily and Spironolactone  25 mg daily.  In the interim, she was evaluated by her PCP in 12/2023 and reported worsening fatigue and episodes of chest tightness intermittently over the past few months and was referred back to Cardiology for further evaluation. Was also referred to Pulmonology for evaluation of sleep apnea.  In talking with the patient today, she reports having worsening fatigue and dyspnea on exertion starting around 09/2023. She is unsure if this is due to an exact etiology or secondary to age but reports symptoms did start rather abruptly. Feels like they were possibly due to dose adjustment of Effexor  at that time but symptoms have persisted despite dose reduction of this. Notices this when doing household chores or working in her yard. She has to stop and rest frequently. Does report occasional episodes of chest discomfort but these are very sharp pains and only last for a few seconds and then resolve. No specific orthopnea, PND or pitting edema. She does continue to have intermittent palpitations but symptoms have overall been well-controlled. She tries to limit her caffeine intake but does consume an  occasional soda. Reports a strong family history of cardiac issues in her mother and father (believes CHF but unsure) and CAD in her sister.   Studies Reviewed:   EKG: EKG is ordered today and demonstrates:   EKG Interpretation Date/Time:  Wednesday Jan 30 2024 15:11:24 EDT Ventricular Rate:  97 PR Interval:  132 QRS Duration:  78 QT Interval:  354 QTC Calculation: 449 R Axis:   55  Text Interpretation: Normal sinus rhythm Normal ECG Confirmed by Woodfin Hays ([id number]) on 01/30/2024 3:12:54 PM       Echocardiogram: 01/2019 IMPRESSIONS     1. The left ventricle has normal systolic function with an ejection  fraction of 60-65%. The cavity size was normal. There is moderately  increased left ventricular wall thickness. Left ventricular diastolic  Doppler parameters are consistent with impaired   relaxation. No evidence of left ventricular regional wall motion  abnormalities.   2. The right ventricle has normal systolic function. The cavity was  normal. There is no increase in right ventricular wall thickness. Right  ventricular systolic pressure normal with an estimated pressure of 27.6  mmHg.   3. Trivial pericardial effusion is present.   4. The aortic valve is tricuspid. Mild aortic annular calcification  noted.   5. The mitral valve is grossly normal. There is mild mitral annular  calcification present.   6. The tricuspid valve is grossly normal.   7. The aortic root is normal in size and structure.   NST: 01/2019 No diagnostic ST changes to indicate ischemia. Small, mild intensity, partially reversible apical anterior defect that is  consistent with breast attenuation. This is a low risk study. Nuclear stress EF: 75%.  Event Monitor: 09/2020 [employer] monitor reviewed, 13 days 8 hours analyzed. Predominant rhythm is sinus with heart rate ranging from 77 bpm up to 146 bpm and average heart rate 96 bpm. Rare PACs were noted representing less than 1% total beats.  Rare PVCs were noted representing less than 1% total beats. 3 beat episode of NSVT noted. Patient triggered event corresponded to normal sinus rhythm. There were no sustained arrhythmias or pauses.    Physical Exam:   VS:  BP 130/84 (BP Location: Left Arm, Patient Position: Sitting, Cuff Size: Normal)   Pulse 97   Ht 5\' 4"  (1.626 m)   Wt 206 lb 9.6 oz (93.7 kg)   SpO2 94%   BMI 35.46 kg/m    Wt Readings from Last 3 Encounters:  01/30/24 206 lb 9.6 oz (93.7 kg)  12/05/23 215 lb 1.9 oz (97.6 kg)  10/15/23 220 lb (99.8 kg)     GEN: Well nourished, well developed female appearing in no acute distress NECK: No JVD; No carotid bruits CARDIAC: RRR, no murmurs, rubs, gallops RESPIRATORY:  Clear to auscultation without rales, wheezing or rhonchi  ABDOMEN: Appears non-distended. No obvious abdominal masses. EXTREMITIES: No clubbing or cyanosis. No pitting edema.  Distal pedal pulses are 2+ bilaterally.   Assessment and Plan:   1. Chest Pain/Dyspnea on Exertion - Her episodes of chest pain seem atypical for a cardiac etiology as they only last for a few seconds and then resolve but she has experienced worsening dyspnea on exertion and fatigue. EKG today without acute ST changes.  - Given the timeframe since most recent ischemic evaluation (low-risk NST in 01/2019 but did have breast tissue attenuation) and her cardiac risk factors (HTN, HLD, Type 2 DM and family history of CAD), reviewed options in regards to a repeat NST or Coronary CTA. She prefers a Coronary CTA and will have this arranged. Will repeat a BMET. She will take Lopressor the morning of her scan and hold Coreg . Continue ASA and statin therapy.   2. Palpitations - Prior monitor showed PAC's, PVC's and brief NSVT as discussed above. Reports symptoms have overall been stable. TSH and electrolytes WNL when checked in 10/2023. Continue current beta-blocker therapy with Coreg  9.375 mg twice daily. Was encouraged to continue to limit  caffeine intake.  3. HTN - BP is at 130/84 during today's visit. Continue current medications with Amlodipine  5 mg daily, Coreg  9.375 mg twice daily, Spironolactone  25 mg daily and Clonidine  0.2 mg daily (on this per PCP).  4. HLD - FLP in 06/2023 showed total cholesterol 158, triglycerides 85, HDL 72 and LDL 70.  Continue current medical therapy with Atorvastatin  10 mg daily.  Signed, Dorma Gash, PA-C

## 2024-01-30 NOTE — Patient Instructions (Signed)
 Medication Instructions:   On the day of your Cardiac CTA do not take Coreg  and Take Lopressor 100 mg two hours prior to Cardiac CTA.   *If you need a refill on your cardiac medications before your next appointment, please call your pharmacy*  Lab Work:  Your physician recommends that you return for lab work in: [employer])    If you have labs (blood work) drawn today and your tests are completely normal, you will receive your results only by: MyChart Message (if you have MyChart) OR A paper copy in the mail If you have any lab test that is abnormal or we need to change your treatment, we will call you to review the results.  Testing/Procedures:   Your cardiac CT will be scheduled at one of the below locations:   [facility] [address] [place], [place] [address] [phone]  OR  [facility] [address] [place], [place] [address] [phone]  OR   [facility] [address] [place], [place] [address] [phone]  OR   [facility] [address] [place], [place] [address] [phone]  OR   [facility] [address]  [place], [place] [address] Opening December 31, 2023  If scheduled at [facility], please arrive at the [facility] and Children's Entrance (Entrance C2) of [facility] 30 minutes prior to test start time. You can use the FREE valet parking offered at entrance C (encouraged to control the heart rate for the test)  Proceed to the [facility] Radiology Department (first floor) to check-in and test prep.   All radiology patients and guests should use entrance C2 at [facility], accessed from [facility], even though the hospital's physical address listed is [address]    If scheduled at the Heart and Vascular Tower at [address], please enter the parking lot using  the [address] entrance and use the FREE valet service at the patient drop-off area. Enter the buidling and check-in with registration on the main floor.  If scheduled at [facility] or [facility], please arrive 15 mins early for check-in and test prep.  There is spacious parking and easy access to the radiology department from the [facility] Heart and Vascular entrance. Please enter here and check-in with the desk attendant.   If scheduled at [facility], please arrive 30 minutes early for check-in and test prep.  Please follow these instructions carefully (unless otherwise directed):  An IV will be required for this test and Nitroglycerin will be given.  Hold all erectile dysfunction medications at least 3 days (72 hrs) prior to test. (Ie viagra, cialis, sildenafil, tadalafil, etc)   On the Night Before the Test: Be sure to Drink plenty of water . Do not consume any caffeinated/decaffeinated beverages or chocolate 12 hours prior to your test. Do not take any antihistamines 12 hours prior to your test. If the patient has contrast allergy: Patient will need a prescription for Prednisone  and very clear instructions (as follows): Prednisone  50 mg - take 13 hours prior to test Take another Prednisone  50 mg 7 hours prior to test Take another Prednisone  50 mg 1 hour prior to test Take Benadryl 50 mg 1 hour prior to test Patient must complete all four doses of above prophylactic medications. Patient will need a ride after test due to Benadryl.  On the Day of the Test: Drink plenty of water  until 1 hour prior to the test. Do not eat any food 1 hour prior to test. You may take your regular medications prior to the test.  Take metoprolol (Lopressor) two hours prior to test. If you take Furosemide/Hydrochlorothiazide /Spironolactone /Chlorthalidone, please HOLD on the morning of the test. Patients who wear a continuous glucose monitor MUST  remove the device prior to scanning. FEMALES- please wear underwire-free bra if available, avoid dresses & tight clothing  *For Clinical Staff only. Please instruct patient the following:* Heart Rate Medication Recommendations for Cardiac CT  Resting HR < 50 bpm  No medication  Resting HR 50-60 bpm and BP >110/50 mmHG   Consider Metoprolol tartrate 25 mg PO 90-120 min prior to scan  Resting HR 60-65 bpm and BP >110/50 mmHG  Metoprolol tartrate 50 mg PO 90-120 minutes prior to scan   Resting HR > 65 bpm and BP >110/50 mmHG  Metoprolol tartrate 100 mg PO 90-120 minutes prior to scan  Consider Ivabradine 10-15 mg PO or a calcium  channel blocker for resting HR >60 bpm and contraindication to metoprolol tartrate  Consider Ivabradine 10-15 mg PO in combination with metoprolol tartrate for HR >80 bpm        After the Test: Drink plenty of water . After receiving IV contrast, you may experience a mild flushed feeling. This is normal. On occasion, you may experience a mild rash up to 24 hours after the test. This is not dangerous. If this occurs, you can take Benadryl 25 mg, Zyrtec, Claritin , or Allegra and increase your fluid intake. (Patients taking Tikosyn should avoid Benadryl, and may take Zyrtec, Claritin , or Allegra) If you experience trouble breathing, this can be serious. If it is severe call 911 IMMEDIATELY. If it is mild, please call our office.  We will call to schedule your test 2-4 weeks out understanding that some insurance companies will need an authorization prior to the service being performed.   For more information and frequently asked questions, please visit our website : [email or web address]  For non-scheduling related questions, please contact the cardiac imaging nurse navigator should you have any questions/concerns: Cardiac Imaging Nurse Navigators Direct Office Dial: [phone]   For scheduling needs, including cancellations and rescheduling, please call  [place], [phone].   Follow-Up: At [facility], you and your health needs are our priority.  As part of our continuing mission to provide you with exceptional heart care, our providers are all part of one team.  This team includes your primary Cardiologist (physician) and Advanced Practice Providers or APPs (Physician Assistants and Nurse Practitioners) who all work together to provide you with the care you need, when you need it.  Your next appointment:   6 month(s)  Provider:   You may see Teddie Favre, MD or one of the following Advanced Practice Providers on your designated Care Team:   Woodfin Hays, PA-C  [place], [place] Theotis Flake, [place]     We recommend signing up for the patient portal called "MyChart".  Sign up information is provided on this After Visit Summary.  MyChart is used to connect with patients for Virtual Visits (Telemedicine).  Patients are able to view lab/test results, encounter notes, upcoming appointments, etc.  Non-urgent messages can be sent to your provider as well.   To learn more about what you can do with MyChart, go to [email or web address].   Other Instructions Thank you for choosing [facility]!

## 2024-02-11 ENCOUNTER — Ambulatory Visit: Payer: Self-pay | Admitting: Student

## 2024-02-11 ENCOUNTER — Other Ambulatory Visit (HOSPITAL_COMMUNITY)
Admission: RE | Admit: 2024-02-11 | Discharge: 2024-02-11 | Disposition: A | Discharge status: Home / Self Care | Source: Ambulatory Visit | Attending: Student | Admitting: Student

## 2024-02-11 ENCOUNTER — Ambulatory Visit (INDEPENDENT_AMBULATORY_CARE_PROVIDER_SITE_OTHER): Admitting: Primary Care

## 2024-02-11 VITALS — BP 150/90 | HR 107 | Ht 64.0 in | Wt 205.2 lb

## 2024-02-11 DIAGNOSIS — R0609 Other forms of dyspnea: Secondary | ICD-10-CM | POA: Diagnosis not present

## 2024-02-11 DIAGNOSIS — R079 Chest pain, unspecified: Secondary | ICD-10-CM | POA: Insufficient documentation

## 2024-02-11 DIAGNOSIS — E876 Hypokalemia: Secondary | ICD-10-CM

## 2024-02-11 DIAGNOSIS — R0683 Snoring: Secondary | ICD-10-CM

## 2024-02-11 LAB — BASIC METABOLIC PANEL WITH GFR
Anion gap: 9 (ref 5–15)
BUN: 9 mg/dL (ref 8–23)
CO2: 24 mmol/L (ref 22–32)
Calcium: 8.6 mg/dL — ABNORMAL LOW (ref 8.9–10.3)
Chloride: 104 mmol/L (ref 98–111)
Creatinine, Ser: 0.81 mg/dL (ref 0.44–1.00)
GFR, Estimated: >60 mL/min (ref >60)
Glucose, Bld: 115 mg/dL — ABNORMAL HIGH (ref 70–99)
Potassium: 3.2 mmol/L — ABNORMAL LOW (ref 3.5–5.1)
Sodium: 137 mmol/L (ref 135–145)

## 2024-02-11 NOTE — Progress Notes (Signed)
 @Patient  ID: Alexis Hurst, female    DOB: July 23, 1951, 73 y.o.   MRN: [id number]  Chief Complaint  Patient presents with   Establish Care    Daytime sleepiness - snoring     Referring provider: Towanda Fret, MD  HPI: 73 year old female, former smoker. PMH significant for HTN, allergic sinusitis, dysphagia, GERD, type 2 diabetes, depression/anxiety, obesity, prediabetes, vit D deficiency, insomnia.   02/11/2024 Discussed the use of AI scribe software for clinical note transcription with the patient, who gave verbal consent to proceed.  History of Present Illness   Alexis Hurst is a 73 year old female who presents with fatigue and snoring. She was referred by her primary doctor for evaluation of fatigue.  She experiences significant fatigue, which has not been previously evaluated with a study. Her fatigue is accompanied by snoring and frequent nighttime awakenings to use the restroom, occurring every two to three hours. Her sleep schedule is irregular. Bedtime is around 11pm. She wakes up every 2-3 hours. Starts her day at 10am.   She is retired since 2012 and previously cared for her mother. She does not consume alcohol.  During the review of symptoms, she has a slight chance of dozing off while sitting and reading, a moderate to high chance while watching TV, and a moderate chance while lying down in the afternoon. She denies dozing off as a passenger in a car but would doze off while talking to someone. She also has a moderate chance of dozing off after lunch without alcohol. No history of narcolepsy, cataplexy, sleepwalking, or restless movements during sleep.     Allergies  Allergen Reactions   Olmesartan  Cough   Lactose Intolerance (Gi) Diarrhea and Nausea And Vomiting    Immunization History  Administered Date(s) Administered   Fluad Quad(high Dose 65+) 04/28/2019, 06/15/2020, 05/13/2021, 05/16/2022   Fluad Trivalent(High Dose 65+) 06/26/2023   Influenza  Split 05/23/2011, 05/16/2012   Influenza Whole 06/21/2008, 06/08/2009, 05/13/2010   Influenza, High Dose Seasonal PF 06/17/2018   Influenza,inj,Quad PF,6+ Mos 06/04/2013, 07/21/2014, 07/20/2015, 05/23/2016, 08/06/2017   Moderna Covid-19 Fall Seasonal Vaccine 54yrs & older 07/23/2023   PFIZER(Purple Top)SARS-COV-2 Vaccination 12/04/2019, 12/26/2019, 08/13/2020   Pneumococcal Conjugate-13 04/20/2014   Pneumococcal Polysaccharide-23 06/17/2018   Td 03/26/2007   Tdap 08/06/2017   Zoster, Live 08/05/2013    Past Medical History:  Diagnosis Date   Allergic sinusitis 12/18/2012   Allergic urticaria 04/02/2017   Anxiety    Calculus of gallbladder without cholecystitis without obstruction 12/19/2022   COVID-19 virus infection 09/05/2019   Depression    Diabetes mellitus without complication (HCC)    Diverticulosis 12/11/2013   Colonoscopy in 2014 per Dr Nolene Baumgarten, also has small hemmorhoids    Dizziness    Mild Orthostatic   Essential hypertension    Fatigue    History of chest pain    Negative stress echo 2010   Hyperlipidemia    Insomnia    Lab test positive for detection of COVID-19 virus 09/24/2019   Mitral valve prolapse    Non-allergic rhinitis 10/31/2017   Obesity    Palpitations    a. PAC's, PVC's and brief NSVT by prior monitor in 09/2020   Paresthesias    Upper extremity /episode sensation of coldness and numbness    Piles (hemorrhoids) 07/20/2015   Raynauds phenomenon    Possible   Swelling of arm    Swelling of left arm    Tobacco History: Social History   Tobacco Use  Smoking Status Former   Current packs/day: 0.00   Average packs/day: 0.5 packs/day for 16.0 years (8.0 ttl pk-yrs)   Types: Cigarettes   Start date: 94   Quit date: 09/04/1986   Years since quitting: 37.4  Smokeless Tobacco Never   Counseling given: Not Answered   Outpatient Medications Prior to Visit  Medication Sig Dispense Refill   acetaminophen  (TYLENOL ) 500 MG tablet Take 500-1,000  mg by mouth every 6 (six) hours as needed (pain.).     amLODipine  (NORVASC ) 5 MG tablet TAKE 1 TABLET(5 MG) BY MOUTH DAILY 90 tablet 3   aspirin 81 MG EC tablet Take 81 mg by mouth at bedtime.     atorvastatin  (LIPITOR) 10 MG tablet Take 1 tablet (10 mg total) by mouth at bedtime. 90 tablet 3   Calcium  Carbonate-Vitamin D  600-200 MG-UNIT TABS Take 1 tablet by mouth daily.     carvedilol  (COREG ) 6.25 MG tablet TAKE 1 AND 1/2 TABLETS BY MOUTH TWICE DAILY WITH A MEAL 270 tablet 3   clobetasol  ointment (TEMOVATE ) 0.05 % Apply bid to affected area for 2 weeks then 3 x a week (Patient taking differently: Apply 1 Application topically daily as needed (skin irritation.).) 45 g 3   cloNIDine  (CATAPRES ) 0.2 MG tablet TAKE 1 TABLET(0.2 MG) BY MOUTH DAILY 90 tablet 3   FLUoxetine  (PROZAC ) 20 MG capsule Take 1 capsule (20 mg total) by mouth daily. 90 capsule 3   fluticasone  (CUTIVATE ) 0.05 % cream Apply 1 Application topically 2 (two) times daily as needed (skin irrtation/rash (skin fold-under breast region)).     fluticasone  (FLONASE ) 50 MCG/ACT nasal spray Place 2 sprays into both nostrils daily. (Patient taking differently: Place 2 sprays into both nostrils daily as needed for allergies.) 16 g 6   metoprolol  tartrate (LOPRESSOR ) 100 MG tablet Take 1 tablet (100 mg total) by mouth as directed. Take 2 hours prior to Cardiac CTA Scan 1 tablet 0   montelukast  (SINGULAIR ) 10 MG tablet TAKE 1 TABLET(10 MG) BY MOUTH AT BEDTIME 90 tablet 1   pantoprazole  (PROTONIX ) 40 MG tablet TAKE 1 TABLET(40 MG) BY MOUTH TWICE DAILY BEFORE A MEAL 180 tablet 1   spironolactone  (ALDACTONE ) 25 MG tablet TAKE 1 TABLET(25 MG) BY MOUTH DAILY 90 tablet 3   terbinafine  (LAMISIL ) 250 MG tablet Take 1 tablet (250 mg total) by mouth daily. 14 tablet 0   tirzepatide (MOUNJARO) 7.5 MG/0.5ML Pen Inject 7.5 mg into the skin once a week. 2 mL 3   venlafaxine  XR (EFFEXOR -XR) 75 MG 24 hr capsule TAKE 1 CAPSULE(75 MG) BY MOUTH DAILY WITH BREAKFAST  90 capsule 1   FLUoxetine  (PROZAC ) 40 MG capsule TAKE 1 CAPSULE(40 MG) BY MOUTH DAILY 90 capsule 3   No facility-administered medications prior to visit.   Review of Systems  Review of Systems  Constitutional:  Positive for fatigue.  HENT: Negative.    Respiratory: Negative.    Cardiovascular: Negative.   Psychiatric/Behavioral:  Positive for sleep disturbance.    Physical Exam  BP (!) 150/90 (BP Location: Left Arm)   Pulse (!) 107   Ht 5\' 4"  (1.626 m)   Wt 205 lb 3.2 oz (93.1 kg)   SpO2 99% Comment: RA  BMI 35.22 kg/m  Physical Exam Constitutional:      Appearance: Normal appearance.  HENT:     Head: Normocephalic and atraumatic.     Mouth/Throat:     Mouth: Mucous membranes are moist.     Pharynx: Oropharynx is clear.  Cardiovascular:  Rate and Rhythm: Normal rate and regular rhythm.  Pulmonary:     Effort: Pulmonary effort is normal.     Breath sounds: Normal breath sounds.  Skin:    General: Skin is warm and dry.  Neurological:     General: No focal deficit present.     Mental Status: She is alert and oriented to person, place, and time. Mental status is at baseline.  Psychiatric:        Mood and Affect: Mood normal.        Behavior: Behavior normal.        Thought Content: Thought content normal.        Judgment: Judgment normal.      Lab Results:  CBC    Component Value Date/Time   WBC 4.4 02/23/2023 1113   WBC 4.3 01/30/2020 1121   RBC 4.14 02/23/2023 1113   RBC 4.25 01/30/2020 1121   HGB 12.3 02/23/2023 1113   HCT 37.1 02/23/2023 1113   PLT 283 02/23/2023 1113   MCV 90 02/23/2023 1113   MCH 29.7 02/23/2023 1113   MCH 29.4 01/30/2020 1121   MCHC 33.2 02/23/2023 1113   MCHC 32.5 01/30/2020 1121   RDW 13.9 02/23/2023 1113   LYMPHSABS 1.5 02/23/2023 1113   MONOABS 0.5 03/18/2013 0905   EOSABS 0.2 02/23/2023 1113   BASOSABS 0.0 02/23/2023 1113    BMET    Component Value Date/Time   NA 137 02/11/2024 1510   NA 143 10/15/2023 1015   K  3.2 (L) 02/11/2024 1510   CL 104 02/11/2024 1510   CO2 24 02/11/2024 1510   GLUCOSE 115 (H) 02/11/2024 1510   BUN 9 02/11/2024 1510   BUN 7 (L) 10/15/2023 1015   CREATININE 0.81 02/11/2024 1510   CREATININE 1.00 (H) 03/15/2020 1213   CALCIUM  8.6 (L) 02/11/2024 1510   GFRNONAA >60 02/11/2024 1510   GFRNONAA 59 (L) 03/15/2020 1213   GFRAA 65 06/15/2020 1647   GFRAA 68 03/15/2020 1213    BNP No results found for: "BNP"  ProBNP No results found for: "PROBNP"  Imaging: No results found.   Assessment & Plan:   1. Snoring (Primary) - Home sleep test; Future  Assessment and Plan    Sleep apnea (suspected) Suspected obstructive sleep apnea based on symptoms of snoring and fatigue. Discussed obstructive versus central sleep apnea and the classification of sleep apnea severity (mild, moderate, severe) based on apneic events per hour. CPAP is the gold standard for moderate to severe cases, while oral appliances are an option for mild cases. Emphasized side sleeping and reducing alcohol intake before bed to decrease apneic events. A home sleep study is appropriate. Results will be reviewed by a sleep specialist to determine the treatment plan based on severity. - Order home sleep study - Advise side sleeping position - Advise minimizing alcohol intake before bedtime - Advise against driving if experiencing excessive daytime sleepiness  Fatigue Chronic fatigue with unclear etiology, possibly due to sleep disturbances from suspected sleep apnea.  Antonio Baumgarten, NP 02/11/2024

## 2024-02-11 NOTE — Patient Instructions (Addendum)
  VISIT SUMMARY: Today, we discussed your symptoms of fatigue and snoring. Based on your symptoms, we suspect you may have obstructive sleep apnea. We reviewed the potential causes and treatments for sleep apnea, and we have planned a home sleep study to gather more information. We also talked about some lifestyle changes that might help improve your sleep quality.  YOUR PLAN: -SLEEP APNEA (SUSPECTED): Sleep apnea is a condition where your breathing stops and starts repeatedly during sleep. This can lead to poor sleep quality and daytime fatigue. We suspect you have obstructive sleep apnea, which is caused by a blockage in your airway. We will conduct a home sleep study to confirm this. In the meantime, try to sleep on your side and avoid alcohol before bed. If you feel excessively sleepy during the day, avoid driving.  -SNORING: Your chronic snoring is likely related to obstructive sleep apnea, which is caused by an obstruction at the base of your tongue. Addressing the sleep apnea should help reduce the snoring.  -FATIGUE: Your chronic fatigue may be due to sleep disturbances from suspected sleep apnea. Improving your sleep quality should help reduce your fatigue.  INSTRUCTIONS: Please complete the home sleep study as instructed. The device will be mailed to your home, and you should use it for one to three nights, aiming for at least four hours of sleep each night. We will review the results and determine the best treatment plan based on the severity of your condition. In the meantime, try to sleep on your side, minimize alcohol intake before bedtime, and avoid driving if you experience excessive daytime sleepiness.  Follow-up Call or send mychart message 3 weeks after completing sleep study for results

## 2024-02-12 DIAGNOSIS — L658 Other specified nonscarring hair loss: Secondary | ICD-10-CM | POA: Diagnosis not present

## 2024-02-12 DIAGNOSIS — L853 Xerosis cutis: Secondary | ICD-10-CM | POA: Diagnosis not present

## 2024-02-12 MED ORDER — POTASSIUM CHLORIDE ER 10 MEQ PO TBCR: EXTENDED_RELEASE_TABLET | ORAL | 3 refills | Status: AC

## 2024-02-12 NOTE — Telephone Encounter (Signed)
-----   Message from Dorma Gash sent at 02/11/2024  8:57 PM EDT ----- Please let the patient know her kidney function is stable but potassium is low at 3.2. She is on Spironolactone  which can help with potassium levels but given that it is still low, I would recommend starting K-dur 10 mEq daily. Would recommend she take 3 on the first day for a total of 30 mEq and then 10 mEq daily afterwards. Repeat BMET in 1 week for reassessment.

## 2024-02-13 ENCOUNTER — Encounter (HOSPITAL_COMMUNITY): Payer: Self-pay

## 2024-02-15 ENCOUNTER — Ambulatory Visit (HOSPITAL_COMMUNITY)
Admission: RE | Admit: 2024-02-15 | Discharge: 2024-02-15 | Disposition: A | Discharge status: Home / Self Care | Source: Ambulatory Visit | Attending: Student | Admitting: Student

## 2024-02-15 DIAGNOSIS — R079 Chest pain, unspecified: Secondary | ICD-10-CM | POA: Diagnosis present

## 2024-02-15 DIAGNOSIS — I2510 Atherosclerotic heart disease of native coronary artery without angina pectoris: Secondary | ICD-10-CM

## 2024-02-15 MED ORDER — DILTIAZEM HCL 25 MG/5ML IV SOLN: 10.0000 mg | INTRAVENOUS | Status: DC | PRN

## 2024-02-15 MED ORDER — METOPROLOL TARTRATE 5 MG/5ML IV SOLN
INTRAVENOUS | Status: AC
Start: 2024-02-15 — End: 2024-02-15
  Filled 2024-02-15: qty 10

## 2024-02-15 MED ORDER — IOHEXOL 350 MG/ML SOLN: 100.0000 mL | Freq: Once | INTRAVENOUS | Status: AC | PRN

## 2024-02-15 MED ORDER — METOPROLOL TARTRATE 5 MG/5ML IV SOLN: 10.0000 mg | Freq: Once | INTRAVENOUS | Status: DC | PRN

## 2024-02-15 MED ORDER — NITROGLYCERIN 0.4 MG SL SUBL: 0.8000 mg | SUBLINGUAL_TABLET | Freq: Once | SUBLINGUAL | Status: AC

## 2024-03-05 ENCOUNTER — Encounter: Payer: Self-pay | Admitting: Family Medicine

## 2024-03-05 ENCOUNTER — Other Ambulatory Visit (HOSPITAL_COMMUNITY)
Admission: RE | Admit: 2024-03-05 | Discharge: 2024-03-05 | Disposition: A | Discharge status: Home / Self Care | Source: Ambulatory Visit | Attending: Student | Admitting: Student

## 2024-03-05 ENCOUNTER — Ambulatory Visit: Payer: Self-pay | Admitting: Student

## 2024-03-05 ENCOUNTER — Ambulatory Visit (INDEPENDENT_AMBULATORY_CARE_PROVIDER_SITE_OTHER): Admitting: Family Medicine

## 2024-03-05 VITALS — BP 120/79 | HR 103 | Resp 16 | Ht 64.0 in | Wt 201.0 lb

## 2024-03-05 DIAGNOSIS — E785 Hyperlipidemia, unspecified: Secondary | ICD-10-CM

## 2024-03-05 DIAGNOSIS — I10 Essential (primary) hypertension: Secondary | ICD-10-CM

## 2024-03-05 DIAGNOSIS — E559 Vitamin D deficiency, unspecified: Secondary | ICD-10-CM

## 2024-03-05 DIAGNOSIS — F418 Other specified anxiety disorders: Secondary | ICD-10-CM

## 2024-03-05 DIAGNOSIS — E1169 Type 2 diabetes mellitus with other specified complication: Secondary | ICD-10-CM | POA: Diagnosis not present

## 2024-03-05 DIAGNOSIS — K219 Gastro-esophageal reflux disease without esophagitis: Secondary | ICD-10-CM

## 2024-03-05 DIAGNOSIS — E876 Hypokalemia: Secondary | ICD-10-CM | POA: Insufficient documentation

## 2024-03-05 LAB — BASIC METABOLIC PANEL WITH GFR
Anion gap: 9 (ref 5–15)
BUN: 13 mg/dL (ref 8–23)
CO2: 26 mmol/L (ref 22–32)
Calcium: 8.7 mg/dL — ABNORMAL LOW (ref 8.9–10.3)
Chloride: 101 mmol/L (ref 98–111)
Creatinine, Ser: 0.93 mg/dL (ref 0.44–1.00)
GFR, Estimated: >60 mL/min (ref >60)
Glucose, Bld: 90 mg/dL (ref 70–99)
Potassium: 4.1 mmol/L (ref 3.5–5.1)
Sodium: 136 mmol/L (ref 135–145)

## 2024-03-05 MED ORDER — CLONIDINE HCL 0.2 MG PO TABS: 0.2000 mg | ORAL_TABLET | Freq: Every day | ORAL | 3 refills | Status: AC

## 2024-03-05 MED ORDER — TIRZEPATIDE 7.5 MG/0.5ML ~~LOC~~ SOAJ: 7.5000 mg | SUBCUTANEOUS | 5 refills | Status: DC

## 2024-03-05 MED ORDER — SPIRONOLACTONE 25 MG PO TABS: ORAL_TABLET | ORAL | 3 refills | Status: AC

## 2024-03-05 MED ORDER — VENLAFAXINE HCL ER 75 MG PO CP24: 75.0000 mg | ORAL_CAPSULE | Freq: Every day | ORAL | 3 refills | Status: AC

## 2024-03-05 MED ORDER — PANTOPRAZOLE SODIUM 40 MG PO TBEC
DELAYED_RELEASE_TABLET | ORAL | 0 refills | Status: DC
Start: 2024-03-05 — End: 2024-06-10

## 2024-03-05 NOTE — Assessment & Plan Note (Signed)
 Diabetes associated with hypertension, hyperlipidemia, obesity, and depression  Alexis Hurst is reminded of the importance of commitment to daily physical activity for 30 minutes or more, as able and the need to limit carbohydrate intake to 30 to 60 grams per meal to help with blood sugar control.   The need to take medication as prescribed, test blood sugar as directed, and to call between visits if there is a concern that blood sugar is uncontrolled is also discussed.   [name] is reminded of the importance of daily foot exam, annual eye examination, and good blood sugar, blood pressure and cholesterol control.     Latest Ref Rng & Units 03/05/2024   11:19 AM 02/11/2024    3:10 PM 10/15/2023   10:15 AM 06/26/2023   10:47 AM 02/23/2023   11:13 AM  Diabetic Labs  HbA1c 4.8 - 5.6 %   6.5  6.4    Chol 100 - 199 mg/dL    841    HDL >60 mg/dL    72    Calc LDL 0 - 99 mg/dL    70    Triglycerides 0 - 149 mg/dL    85    Creatinine 9.55 - 1.00 mg/dL 9.06  9.18  9.13  9.04  0.89       03/05/2024    9:59 AM 02/15/2024   11:31 AM 02/15/2024   11:14 AM 02/11/2024    3:38 PM 01/30/2024    2:35 PM 12/05/2023    9:42 AM 10/15/2023   11:01 AM  BP/Weight  Systolic BP 120 118 134 150 130 127 155  Diastolic BP 79 72 85 90 84 83 84  Wt. (Lbs) 201.04   205.2 206.6 215.12   BMI 34.51 kg/m2   35.22 kg/m2 35.46 kg/m2 36.93 kg/m2       Latest Ref Rng & Units 03/05/2024   10:00 AM 01/22/2023   12:00 AM  Foot/eye exam completion dates  Eye Exam No Retinopathy  No Retinopathy      Foot Form Completion  Done      This result is from an external source.

## 2024-03-05 NOTE — Assessment & Plan Note (Signed)
  Patient re-educated about  the importance of commitment to a  minimum of 150 minutes of exercise per week as able.  The importance of healthy food choices with portion control discussed, as well as eating regularly and within a 12 hour window most days. The need to choose clean , green food 50 to 75% of the time is discussed, as well as to make water  the primary drink and set a goal of 64 ounces water  daily.       03/05/2024    9:59 AM 02/11/2024    3:38 PM 01/30/2024    2:35 PM  Weight /BMI  Weight 201 lb 0.6 oz 205 lb 3.2 oz 206 lb 9.6 oz  Height 5' 4 (1.626 m) 5' 4 (1.626 m) 5' 4 (1.626 m)  BMI 34.51 kg/m2 35.22 kg/m2 35.46 kg/m2    Improved, stay on same dose mounjaro

## 2024-03-05 NOTE — Progress Notes (Signed)
 Nykayla Hurst     MRN: [id number]      DOB: Sep 19, 1950  Chief Complaint  Patient presents with   Medical Management of Chronic Issues    3 month follow up     HPI [name] is here for follow up and re-evaluation of chronic medical conditions, medication management and review of any available recent lab and radiology data.  Preventive health is updated, specifically  Cancer screening and Immunization.   Questions or concerns regarding consultations or procedures which the PT has had in the interim are  addressed. The PT c/o bruising on higher / current dose mounjaro  There are no new concerns.  There are no specific complaints   ROS Denies recent fever or chills. Denies sinus pressure, nasal congestion, ear pain or sore throat. Denies chest congestion, productive cough or wheezing. Denies chest pains, palpitations and leg swelling Denies abdominal pain, nausea, vomiting,diarrhea or constipation.   Denies dysuria, frequency, hesitancy or incontinence. Denies joint pain, swelling and limitation in mobility. Denies headaches, seizures, numbness, or tingling. Denies depression, anxiety or insomnia. Denies skin break down or rash.   PE  BP 120/79   Pulse (!) 103   Resp 16   Ht 5' 4 (1.626 m)   Wt 201 lb 0.6 oz (91.2 kg)   SpO2 95%   BMI 34.51 kg/m   Patient alert and oriented and in no cardiopulmonary distress.  HEENT: No facial asymmetry, EOMI,     Neck supple .  Chest: Clear to auscultation bilaterally.  CVS: S1, S2 no murmurs, no S3.Regular rate.  ABD: Soft non tender.   Ext: No edema  MS: Adequate ROM spine, shoulders, hips and knees.  Skin: Intact, no ulcerations or rash noted.  Psych: Good eye contact, normal affect. Memory intact not anxious or depressed appearing.  CNS: CN 2-12 intact, power,  normal throughout.no focal deficits noted.   Assessment & Plan  Essential hypertension Controlled, no change in medication DASH diet and commitment  to daily physical activity for a minimum of 30 minutes discussed and encouraged, as a part of hypertension management. The importance of attaining a healthy weight is also discussed.     03/05/2024    9:59 AM 02/15/2024   11:31 AM 02/15/2024   11:14 AM 02/11/2024    3:38 PM 01/30/2024    2:35 PM 12/05/2023    9:42 AM 10/15/2023   11:01 AM  BP/Weight  Systolic BP 120 118 134 150 130 127 155  Diastolic BP 79 72 85 90 84 83 84  Wt. (Lbs) 201.04   205.2 206.6 215.12   BMI 34.51 kg/m2   35.22 kg/m2 35.46 kg/m2 36.93 kg/m2        Hyperlipidemia LDL goal <100 Hyperlipidemia:Low fat diet discussed and encouraged.   Lipid Panel  Lab Results  Component Value Date   CHOL 158 06/26/2023   HDL 72 06/26/2023   LDLCALC 70 06/26/2023   TRIG 85 06/26/2023   CHOLHDL 2.2 06/26/2023     Updated lab needed at/ before next visit.   Type 2 diabetes mellitus with other specified complication (HCC) Diabetes associated with hypertension, hyperlipidemia, obesity, and depression  Alexis Hurst is reminded of the importance of commitment to daily physical activity for 30 minutes or more, as able and the need to limit carbohydrate intake to 30 to 60 grams per meal to help with blood sugar control.   The need to take medication as prescribed, test blood sugar as directed, and to call between  visits if there is a concern that blood sugar is uncontrolled is also discussed.   Alexis Hurst is reminded of the importance of daily foot exam, annual eye examination, and good blood sugar, blood pressure and cholesterol control.     Latest Ref Rng & Units 03/05/2024   11:19 AM 02/11/2024    3:10 PM 10/15/2023   10:15 AM 06/26/2023   10:47 AM 02/23/2023   11:13 AM  Diabetic Labs  HbA1c 4.8 - 5.6 %   6.5  6.4    Chol 100 - 199 mg/dL    841    HDL >60 mg/dL    72    Calc LDL 0 - 99 mg/dL    70    Triglycerides 0 - 149 mg/dL    85    Creatinine 9.55 - 1.00 mg/dL 9.06  9.18  9.13  9.04  0.89       03/05/2024    9:59 AM  02/15/2024   11:31 AM 02/15/2024   11:14 AM 02/11/2024    3:38 PM 01/30/2024    2:35 PM 12/05/2023    9:42 AM 10/15/2023   11:01 AM  BP/Weight  Systolic BP 120 118 134 150 130 127 155  Diastolic BP 79 72 85 90 84 83 84  Wt. (Lbs) 201.04   205.2 206.6 215.12   BMI 34.51 kg/m2   35.22 kg/m2 35.46 kg/m2 36.93 kg/m2       Latest Ref Rng & Units 03/05/2024   10:00 AM 01/22/2023   12:00 AM  Foot/eye exam completion dates  Eye Exam No Retinopathy  No Retinopathy      Foot Form Completion  Done      This result is from an external source.        Morbid obesity (HCC)  Patient re-educated about  the importance of commitment to a  minimum of 150 minutes of exercise per week as able.  The importance of healthy food choices with portion control discussed, as well as eating regularly and within a 12 hour window most days. The need to choose clean , green food 50 to 75% of the time is discussed, as well as to make water  the primary drink and set a goal of 64 ounces water  daily.       03/05/2024    9:59 AM 02/11/2024    3:38 PM 01/30/2024    2:35 PM  Weight /BMI  Weight 201 lb 0.6 oz 205 lb 3.2 oz 206 lb 9.6 oz  Height 5' 4 (1.626 m) 5' 4 (1.626 m) 5' 4 (1.626 m)  BMI 34.51 kg/m2 35.22 kg/m2 35.46 kg/m2    Improved, stay on same dose mounjaro   Depression with anxiety Controlled on current meds , continue same

## 2024-03-05 NOTE — Patient Instructions (Addendum)
 Annual exam in office in 12 to 13 weeks, call if you need be before.  Congratulations on excellent weight loss, no change in current dose of Mounjaro .  Fasting CBC lipid panel CMP and EGFR HbA1c and vitamin D  level to be checked 3 to 5 days before next visit.  Please schedule retinal screening for diabetes in the office at checkout if patient does not have a scheduled eye exam before the end of this year.  Please schedule annual wellness visit at the time of checkout.  This is overdue.  It is important that you exercise regularly at least 30 minutes 5 times a week. If you develop chest pain, have severe difficulty breathing, or feel very tired, stop exercising immediately and seek medical attention    Think about what you will eat, plan ahead. Choose  clean, green, fresh or frozen over canned, processed or packaged foods which are more sugary, salty and fatty. 70 to 75% of food eaten should be vegetables and fruit. Three meals at set times with snacks allowed between meals, but they must be fruit or vegetables. Aim to eat over a 12 hour period , example 7 am to 7 pm, and STOP after  your last meal of the day. Drink water ,generally about 64 ounces per day, no other drink is as healthy. Fruit juice is best enjoyed in a healthy way, by EATING the fruit.   Thanks for choosing [facility], we consider it a privelige to serve you.

## 2024-03-05 NOTE — Assessment & Plan Note (Signed)
 Hyperlipidemia:Low fat diet discussed and encouraged.   Lipid Panel  Lab Results  Component Value Date   CHOL 158 06/26/2023   HDL 72 06/26/2023   LDLCALC 70 06/26/2023   TRIG 85 06/26/2023   CHOLHDL 2.2 06/26/2023     Updated lab needed at/ before next visit.

## 2024-03-05 NOTE — Assessment & Plan Note (Signed)
 Controlled, no change in medication DASH diet and commitment to daily physical activity for a minimum of 30 minutes discussed and encouraged, as a part of hypertension management. The importance of attaining a healthy weight is also discussed.     03/05/2024    9:59 AM 02/15/2024   11:31 AM 02/15/2024   11:14 AM 02/11/2024    3:38 PM 01/30/2024    2:35 PM 12/05/2023    9:42 AM 10/15/2023   11:01 AM  BP/Weight  Systolic BP 120 118 134 150 130 127 155  Diastolic BP 79 72 85 90 84 83 84  Wt. (Lbs) 201.04   205.2 206.6 215.12   BMI 34.51 kg/m2   35.22 kg/m2 35.46 kg/m2 36.93 kg/m2

## 2024-03-06 ENCOUNTER — Ambulatory Visit: Payer: Self-pay | Admitting: Family Medicine

## 2024-03-06 LAB — MICROALBUMIN / CREATININE URINE RATIO
Creatinine, Urine: 198.6 mg/dL
Microalb/Creat Ratio: 5 mg/g{creat} (ref 0–29)
Microalbumin, Urine: 10.8 ug/mL

## 2024-03-12 ENCOUNTER — Ambulatory Visit: Admitting: Student

## 2024-03-16 NOTE — Assessment & Plan Note (Signed)
Controlled on current meds, continue same 

## 2024-04-18 ENCOUNTER — Other Ambulatory Visit: Payer: Self-pay | Admitting: Family Medicine

## 2024-04-21 ENCOUNTER — Other Ambulatory Visit (HOSPITAL_COMMUNITY): Payer: Self-pay | Admitting: Family Medicine

## 2024-04-21 DIAGNOSIS — R928 Other abnormal and inconclusive findings on diagnostic imaging of breast: Secondary | ICD-10-CM

## 2024-05-06 ENCOUNTER — Encounter (HOSPITAL_COMMUNITY): Payer: Self-pay

## 2024-05-06 ENCOUNTER — Ambulatory Visit (HOSPITAL_COMMUNITY)
Admission: RE | Admit: 2024-05-06 | Discharge: 2024-05-06 | Disposition: A | Discharge status: Home / Self Care | Source: Ambulatory Visit | Attending: Family Medicine | Admitting: Family Medicine

## 2024-05-06 DIAGNOSIS — N6012 Diffuse cystic mastopathy of left breast: Secondary | ICD-10-CM | POA: Diagnosis not present

## 2024-05-06 DIAGNOSIS — R92322 Mammographic fibroglandular density, left breast: Secondary | ICD-10-CM | POA: Diagnosis not present

## 2024-05-06 DIAGNOSIS — R928 Other abnormal and inconclusive findings on diagnostic imaging of breast: Secondary | ICD-10-CM | POA: Diagnosis not present

## 2024-06-05 ENCOUNTER — Encounter

## 2024-06-05 DIAGNOSIS — R0683 Snoring: Secondary | ICD-10-CM

## 2024-06-05 DIAGNOSIS — G4730 Sleep apnea, unspecified: Secondary | ICD-10-CM | POA: Diagnosis not present

## 2024-06-06 ENCOUNTER — Encounter: Admitting: Family Medicine

## 2024-06-09 ENCOUNTER — Other Ambulatory Visit: Payer: Self-pay

## 2024-06-09 ENCOUNTER — Encounter: Payer: Self-pay | Admitting: Family Medicine

## 2024-06-09 DIAGNOSIS — K219 Gastro-esophageal reflux disease without esophagitis: Secondary | ICD-10-CM

## 2024-06-09 MED ORDER — MONTELUKAST SODIUM 10 MG PO TABS: ORAL_TABLET | ORAL | 1 refills | Status: AC

## 2024-06-10 DIAGNOSIS — L853 Xerosis cutis: Secondary | ICD-10-CM | POA: Diagnosis not present

## 2024-06-10 DIAGNOSIS — L658 Other specified nonscarring hair loss: Secondary | ICD-10-CM | POA: Diagnosis not present

## 2024-06-10 MED ORDER — PANTOPRAZOLE SODIUM 40 MG PO TBEC: DELAYED_RELEASE_TABLET | ORAL | 3 refills | Status: AC

## 2024-06-13 DIAGNOSIS — G4733 Obstructive sleep apnea (adult) (pediatric): Secondary | ICD-10-CM | POA: Diagnosis not present

## 2024-06-24 ENCOUNTER — Encounter: Payer: Self-pay | Admitting: Gastroenterology

## 2024-07-07 DIAGNOSIS — K219 Gastro-esophageal reflux disease without esophagitis: Secondary | ICD-10-CM | POA: Diagnosis not present

## 2024-07-07 DIAGNOSIS — I471 Supraventricular tachycardia, unspecified: Secondary | ICD-10-CM | POA: Diagnosis not present

## 2024-07-07 DIAGNOSIS — E669 Obesity, unspecified: Secondary | ICD-10-CM | POA: Diagnosis not present

## 2024-07-07 DIAGNOSIS — F325 Major depressive disorder, single episode, in full remission: Secondary | ICD-10-CM | POA: Diagnosis not present

## 2024-07-07 DIAGNOSIS — E1122 Type 2 diabetes mellitus with diabetic chronic kidney disease: Secondary | ICD-10-CM | POA: Diagnosis not present

## 2024-07-07 DIAGNOSIS — Z7982 Long term (current) use of aspirin: Secondary | ICD-10-CM | POA: Diagnosis not present

## 2024-07-07 DIAGNOSIS — E785 Hyperlipidemia, unspecified: Secondary | ICD-10-CM | POA: Diagnosis not present

## 2024-07-07 DIAGNOSIS — E11620 Type 2 diabetes mellitus with diabetic dermatitis: Secondary | ICD-10-CM | POA: Diagnosis not present

## 2024-07-07 DIAGNOSIS — M1990 Unspecified osteoarthritis, unspecified site: Secondary | ICD-10-CM | POA: Diagnosis not present

## 2024-07-24 ENCOUNTER — Other Ambulatory Visit (HOSPITAL_COMMUNITY): Payer: Self-pay | Admitting: Family Medicine

## 2024-07-24 ENCOUNTER — Encounter: Payer: Self-pay | Admitting: Family Medicine

## 2024-07-24 ENCOUNTER — Ambulatory Visit (INDEPENDENT_AMBULATORY_CARE_PROVIDER_SITE_OTHER): Admitting: Family Medicine

## 2024-07-24 VITALS — BP 116/77 | HR 100 | Resp 16 | Ht 64.0 in | Wt 190.0 lb

## 2024-07-24 DIAGNOSIS — R928 Other abnormal and inconclusive findings on diagnostic imaging of breast: Secondary | ICD-10-CM

## 2024-07-24 DIAGNOSIS — Z0001 Encounter for general adult medical examination with abnormal findings: Secondary | ICD-10-CM

## 2024-07-24 DIAGNOSIS — E559 Vitamin D deficiency, unspecified: Secondary | ICD-10-CM | POA: Diagnosis not present

## 2024-07-24 DIAGNOSIS — N644 Mastodynia: Secondary | ICD-10-CM

## 2024-07-24 DIAGNOSIS — N6320 Unspecified lump in the left breast, unspecified quadrant: Secondary | ICD-10-CM | POA: Diagnosis not present

## 2024-07-24 DIAGNOSIS — N898 Other specified noninflammatory disorders of vagina: Secondary | ICD-10-CM | POA: Diagnosis not present

## 2024-07-24 DIAGNOSIS — I10 Essential (primary) hypertension: Secondary | ICD-10-CM | POA: Diagnosis not present

## 2024-07-24 DIAGNOSIS — E785 Hyperlipidemia, unspecified: Secondary | ICD-10-CM | POA: Diagnosis not present

## 2024-07-24 DIAGNOSIS — E1169 Type 2 diabetes mellitus with other specified complication: Secondary | ICD-10-CM | POA: Diagnosis not present

## 2024-07-24 MED ORDER — ACYCLOVIR 800 MG PO TABS: 800.0000 mg | ORAL_TABLET | Freq: Three times a day (TID) | ORAL | 0 refills | Status: AC

## 2024-07-24 MED ORDER — MELOXICAM 15 MG PO TABS: 15.0000 mg | ORAL_TABLET | Freq: Every day | ORAL | 0 refills | Status: AC

## 2024-07-24 NOTE — Patient Instructions (Addendum)
 F/U in 4 months  [facility]als schedule mammogram at checkout  Labs ordered in July today pls  Send message re current dose of fluoxetine  so this  can be refilled please  It is important that you exercise regularly at least 30 minutes 5 times a week. If you develop chest pain, have severe difficulty breathing, or feel very tired, stop exercising immediately and seek medical attention     You are referred for Gyne exam  Meloxicam  is prescribed for as needed use  Acyclovir  is prescribed for rash and burning sensation of left armpit  Thanks for choosing  [facility], we consider it a privelige to serve you.

## 2024-07-24 NOTE — Assessment & Plan Note (Signed)
 Reports intermittent irtching will refer for gyne eval

## 2024-07-25 ENCOUNTER — Other Ambulatory Visit: Payer: Self-pay

## 2024-07-25 DIAGNOSIS — B029 Zoster without complications: Secondary | ICD-10-CM | POA: Insufficient documentation

## 2024-07-25 DIAGNOSIS — R928 Other abnormal and inconclusive findings on diagnostic imaging of breast: Secondary | ICD-10-CM | POA: Insufficient documentation

## 2024-07-25 DIAGNOSIS — N644 Mastodynia: Secondary | ICD-10-CM | POA: Insufficient documentation

## 2024-07-25 DIAGNOSIS — R7989 Other specified abnormal findings of blood chemistry: Secondary | ICD-10-CM

## 2024-07-25 LAB — CBC WITH DIFFERENTIAL/PLATELET
Basophils Absolute: 0.1 x10E3/uL (ref 0.0–0.2)
Basos: 1 %
EOS (ABSOLUTE): 0.3 x10E3/uL (ref 0.0–0.4)
Eos: 6 %
Hematocrit: 41.6 % (ref 34.0–46.6)
Hemoglobin: 13.1 g/dL (ref 11.1–15.9)
Immature Grans (Abs): 0 x10E3/uL (ref 0.0–0.1)
Immature Granulocytes: 0 %
Lymphocytes Absolute: 1.4 x10E3/uL (ref 0.7–3.1)
Lymphs: 29 %
MCH: 30.3 pg (ref 26.6–33.0)
MCHC: 31.5 g/dL (ref 31.5–35.7)
MCV: 96 fL (ref 79–97)
Monocytes Absolute: 0.5 x10E3/uL (ref 0.1–0.9)
Monocytes: 10 %
Neutrophils Absolute: 2.7 x10E3/uL (ref 1.4–7.0)
Neutrophils: 54 %
Platelets: 332 x10E3/uL (ref 150–450)
RBC: 4.32 x10E6/uL (ref 3.77–5.28)
RDW: 13.7 % (ref 11.7–15.4)
WBC: 5 x10E3/uL (ref 3.4–10.8)

## 2024-07-25 LAB — CMP14+EGFR
ALT: 7 IU/L (ref 0–32)
AST: 16 IU/L (ref 0–40)
Albumin: 4.3 g/dL (ref 3.8–4.8)
Alkaline Phosphatase: 110 IU/L (ref 49–135)
BUN/Creatinine Ratio: 13 (ref 12–28)
BUN: 16 mg/dL (ref 8–27)
Bilirubin Total: 0.4 mg/dL (ref 0.0–1.2)
CO2: 24 mmol/L (ref 20–29)
Calcium: 9.4 mg/dL (ref 8.7–10.3)
Chloride: 101 mmol/L (ref 96–106)
Creatinine, Ser: 1.19 mg/dL — ABNORMAL HIGH (ref 0.57–1.00)
Globulin, Total: 3.3 g/dL (ref 1.5–4.5)
Glucose: 98 mg/dL (ref 70–99)
Potassium: 4.5 mmol/L (ref 3.5–5.2)
Sodium: 138 mmol/L (ref 134–144)
Total Protein: 7.6 g/dL (ref 6.0–8.5)
eGFR: 49 mL/min/1.73 — ABNORMAL LOW (ref >59)

## 2024-07-25 LAB — LIPID PANEL
Chol/HDL Ratio: 2.2 ratio (ref 0.0–4.4)
Cholesterol, Total: 173 mg/dL (ref 100–199)
HDL: 77 mg/dL (ref >39)
LDL Chol Calc (NIH): 81 mg/dL (ref 0–99)
Triglycerides: 80 mg/dL (ref 0–149)
VLDL Cholesterol Cal: 15 mg/dL (ref 5–40)

## 2024-07-25 LAB — HEMOGLOBIN A1C
Est. average glucose Bld gHb Est-mCnc: 114 mg/dL
Hgb A1c MFr Bld: 5.6 % (ref 4.8–5.6)

## 2024-07-25 LAB — VITAMIN D 25 HYDROXY (VIT D DEFICIENCY, FRACTURES): Vit D, 25-Hydroxy: 31 ng/mL (ref 30.0–100.0)

## 2024-07-25 NOTE — Assessment & Plan Note (Signed)
 Possible shingles based on history and exam, acyclovir  x 7 days prescribed

## 2024-07-25 NOTE — Assessment & Plan Note (Signed)
 Distorted left breast noted 05/2024   pt c/o left breast pain schedule  mammogram due 08/2024 at checkout

## 2024-07-25 NOTE — Progress Notes (Signed)
    Alexis Hurst     MRN: [id number]      DOB: June 25, 1951  Chief Complaint  Patient presents with   Annual Exam    Cpe    side pain    Pt complains of side pain under arm pit x1 week. No known injury. Worse with movement     HPI: Patient is in for annual physical exam. Concern as above pain is localized lateral to 3 oclock position and radiates around rib toward back described as a burning / stinging Immunization is reviewed , and  updated if needed.   PE: BP 116/77   Pulse 100   Resp 16   Ht 5' 4 (1.626 m)   Wt 190 lb (86.2 kg)   SpO2 95%   BMI 32.61 kg/m   Pleasant  female, alert and oriented x 3, in no cardio-pulmonary distress. Afebrile. HEENT No facial trauma or asymetry. Sinuses non tender.  Extra occullar muscles intact.. External ears normal, . Neck: supple, no adenopathy,JVD or thyromegaly.No bruits.  Chest: Clear to ascultation bilaterally.No crackles or wheezes. Non tender to palpation  Breast: No asymetry,Left breast  tender palpable nodules at 3 and 9 o clock, tender. No nipple discharge or inversion. No axillary or supraclavicular adenopathy  Cardiovascular system; Heart sounds normal,  S1 and  S2 ,no S3.  No murmur, or thrill. Apical beat not displaced Peripheral pulses normal.  Abdomen: Soft, non tender, no organomegaly or masses. No bruits. Bowel sounds normal. No guarding, tenderness or rebound.   GU: Referred to gyne as continues to c/o itching/ stinging in vulva and vagina  Musculoskeletal exam: Full ROM of spine, hips , shoulders and knees. No deformity ,swelling or crepitus noted. No muscle wasting or atrophy.   Neurologic: Cranial nerves 2 to 12 intact. Power, tone ,sensation  normal throughout. No disturbance in gait. No tremor.  Skin: Intact, mild erythem and tender subcutaneous nodule on left breast. Pigmentation normal throughout  Psych; Normal mood and affect. Judgement and concentration normal   Assessment  & Plan:  Vaginal itching Reports intermittent irtching will refer for gyne eval  Abnormal mammogram of left breast Distorted left breast noted 05/2024   pt c/o left breast pain schedule  mammogram due 08/2024 at [facility]  Breast pain, left Possible shingles based on history and exam, acyclovir  x 7 days prescribed  Encounter for Medicare annual examination with abnormal findings Annual exam as documented. Counseling done  re healthy lifestyle involving commitment to 150 minutes exercise per week, heart healthy diet, and attaining healthy weight.The importance of adequate sleep also discussed.  Immunization and cancer screening needs are specifically addressed at this visit.

## 2024-07-25 NOTE — Assessment & Plan Note (Addendum)
 Annual exam as documented. Counseling done  re healthy lifestyle involving commitment to 150 minutes exercise per week, heart healthy diet, and attaining healthy weight.The importance of adequate sleep also discussed.  Immunization and cancer screening needs are specifically addressed at this visit.

## 2024-07-29 ENCOUNTER — Ambulatory Visit (INDEPENDENT_AMBULATORY_CARE_PROVIDER_SITE_OTHER)

## 2024-07-29 VITALS — Ht 64.0 in | Wt 192.0 lb

## 2024-07-29 DIAGNOSIS — Z0001 Encounter for general adult medical examination with abnormal findings: Secondary | ICD-10-CM

## 2024-07-29 DIAGNOSIS — Z780 Asymptomatic menopausal state: Secondary | ICD-10-CM

## 2024-07-29 DIAGNOSIS — E1169 Type 2 diabetes mellitus with other specified complication: Secondary | ICD-10-CM

## 2024-07-29 DIAGNOSIS — Z0000 Encounter for general adult medical examination without abnormal findings: Secondary | ICD-10-CM

## 2024-07-29 DIAGNOSIS — Z7985 Long-term (current) use of injectable non-insulin antidiabetic drugs: Secondary | ICD-10-CM

## 2024-07-29 MED ORDER — BLOOD GLUCOSE MONITORING SUPPL DEVI: 1.0000 | Freq: Three times a day (TID) | 0 refills | Status: AC

## 2024-07-29 MED ORDER — BLOOD GLUCOSE TEST VI STRP: 1.0000 | ORAL_STRIP | Freq: Three times a day (TID) | 0 refills | Status: AC

## 2024-07-29 MED ORDER — LANCET DEVICE MISC: 1.0000 | Freq: Three times a day (TID) | 0 refills | Status: AC

## 2024-07-29 MED ORDER — LANCETS MISC: 1.0000 | 0 refills | Status: AC

## 2024-07-29 NOTE — Progress Notes (Signed)
 Chief Complaint  Patient presents with   Medicare Wellness     Subjective:   Alexis Hurst is a 73 y.o. female who presents for a Medicare Annual Wellness Visit.  Allergies (verified) Olmesartan  and Lactose intolerance (gi)   History: Past Medical History:  Diagnosis Date   Allergic sinusitis 12/18/2012   Allergic urticaria 04/02/2017   Anxiety    Calculus of gallbladder without cholecystitis without obstruction 12/19/2022   COVID-19 virus infection 09/05/2019   Depression    Diabetes mellitus without complication (HCC)    Diverticulosis 12/11/2013   Colonoscopy in 2014 per Dr Harvey, also has small hemmorhoids    Dizziness    Mild Orthostatic   Essential hypertension    Fatigue    History of chest pain    Negative stress echo 2010   Hyperlipidemia    Insomnia    Lab test positive for detection of COVID-19 virus 09/24/2019   Mitral valve prolapse    Non-allergic rhinitis 10/31/2017   Obesity    Palpitations    a. PAC's, PVC's and brief NSVT by prior monitor in 09/2020   Paresthesias    Upper extremity /episode sensation of coldness and numbness    Piles (hemorrhoids) 07/20/2015   Raynauds phenomenon    Possible   Swelling of arm    Swelling of left arm   Past Surgical History:  Procedure Laterality Date   ABDOMINAL HYSTERECTOMY     BREAST BIOPSY Left 09/19/2023   BENIGN BREAST TISSUE WITH COLUMNAR CELL AND FIBROCYSTIC CHANGES//MM LT BREAST BX W LOC DEV 1ST LESION IMAGE BX SPEC STEREO GUIDE 09/19/2023 [facility]   BREAST EXCISIONAL BIOPSY Left    BREAST SURGERY     CATARACT EXTRACTION Right 02/03/2020   CATARACT EXTRACTION Left 02/09/2020   CHOLECYSTECTOMY     COLONOSCOPY  06/11/2003   Smith:multiple medium scatered diverticula in the cecum, a sending colon, transverse colon, descending colon, sigmoid colon.   COLONOSCOPY N/A 05/20/2013   TICS, SML IH   COLONOSCOPY WITH PROPOFOL  N/A 10/10/2022   Procedure: COLONOSCOPY WITH PROPOFOL ;   Surgeon: Cindie Carlin POUR, DO;  [facility];  Service: Endoscopy;  Laterality: N/A;  11:30 am   Cystectomy L breast, benign     ESOPHAGOGASTRODUODENOSCOPY (EGD) WITH PROPOFOL  N/A 10/10/2022   Procedure: ESOPHAGOGASTRODUODENOSCOPY (EGD) WITH PROPOFOL ;  Surgeon: Cindie Carlin POUR, DO;  Location: [facility];  Service: Endoscopy;  Laterality: N/A;   VESICOVAGINAL FISTULA CLOSURE W/ TAH  1989   Family History  Problem Relation Age of Onset   Hypertension Mother    Urticaria Mother    Heart attack Mother    Heart failure Father    Lung cancer Father    Heart disease Father    Asthma Sister    Heart disease Sister    Hypertension Sister    Hypertension Sister    Breast cancer Daughter 30   Colon cancer Maternal Grandfather        Age greater than 60   Hypertension Brother    Crohn's disease Brother    Diabetes Brother    Allergic rhinitis Neg Hx    Angioedema Neg Hx    Atopy Neg Hx    Eczema Neg Hx    Immunodeficiency Neg Hx    Social History   Occupational History   Occupation: runner, broadcasting/film/video   Tobacco Use   Smoking status: Former    Current packs/day: 0.00    Average packs/day: 0.5 packs/day for 16.0 years (8.0 ttl pk-yrs)  Types: Cigarettes    Start date: 33    Quit date: 09/04/1986    Years since quitting: 37.9   Smokeless tobacco: Never  Vaping Use   Vaping status: Never Used  Substance and Sexual Activity   Alcohol use: Yes    Alcohol/week: 0.0 standard drinks of alcohol    Comment: occ   Drug use: No   Sexual activity: Not Currently    Birth control/protection: Surgical    Comment: hyst   Tobacco Counseling Counseling given: Yes  SDOH Screenings   Food Insecurity: No Food Insecurity (07/29/2024)  Housing: Low Risk  (07/29/2024)  Transportation Needs: No Transportation Needs (07/29/2024)  Utilities: Not At Risk (07/29/2024)  Alcohol Screen: Low Risk  (06/29/2021)  Depression (PHQ2-9): Low Risk  (07/29/2024)  Financial Resource Strain: Low  Risk  (06/29/2021)  Physical Activity: Sufficiently Active (07/29/2024)  Social Connections: Socially Integrated (07/29/2024)  Stress: No Stress Concern Present (07/29/2024)  Tobacco Use: Medium Risk (07/29/2024)  Health Literacy: Adequate Health Literacy (07/29/2024)   See flowsheets for full screening details  Depression Screen PHQ 2 & 9 Depression Scale- Over the past 2 weeks, how often have you been bothered by any of the following problems? Little interest or pleasure in doing things: 0 Feeling down, depressed, or hopeless (PHQ Adolescent also includes...irritable): 0 PHQ-2 Total Score: 0 Trouble falling or staying asleep, or sleeping too much: 2 Feeling tired or having little energy: 2 Poor appetite or overeating (PHQ Adolescent also includes...weight loss): 0 Feeling bad about yourself - or that you are a failure or have let yourself or your family down: 0 Trouble concentrating on things, such as reading the newspaper or watching television (PHQ Adolescent also includes...like school work): 3 Moving or speaking so slowly that other people could have noticed. Or the opposite - being so fidgety or restless that you have been moving around a lot more than usual: 0 Thoughts that you would be better off dead, or of hurting yourself in some way: 0 PHQ-9 Total Score: 10 If you checked off any problems, how difficult have these problems made it for you to do your work, take care of things at home, or get along with other people?: Somewhat difficult  Depression Treatment Depression Interventions/Treatment : Medication     Goals Addressed               This Visit's Progress     I just want to tone up some (pt-stated)         Visit info / Clinical Intake: Medicare Wellness Visit Type:: Subsequent Annual Wellness Visit Persons participating in visit:: patient Medicare Wellness Visit Mode:: Telephone If telephone:: video declined Because this visit was a virtual/telehealth  visit:: unable to obtan vitals due to lack of equipment If Telephone or Video please confirm:: I connected with the patient using audio enabled telemedicine application and verified that I am speaking with the correct person using two identifiers; I discussed the limitations of evaluation and management by telemedicine; The patient expressed understanding and agreed to proceed Patient Location:: home Provider Location:: office Information given by:: patient Interpreter Needed?: No Pre-visit prep was completed: yes AWV questionnaire completed by patient prior to visit?: no Living arrangements:: lives with spouse/significant other Patient's Overall Health Status Rating: good Typical amount of pain: none Does pain affect daily life?: no Are you currently prescribed opioids?: no  Dietary Habits and Nutritional Risks How many meals a day?: 2 Eats fruit and vegetables daily?: yes Most meals are obtained by:  preparing own meals In the last 2 weeks, have you had any of the following?: none Diabetic:: (!) yes Any non-healing wounds?: no How often do you check your BS?: 0 (prescription sent in for meter) Would you like to be referred to a Nutritionist or for Diabetic Management? : no  Functional Status Activities of Daily Living (to include ambulation/medication): Independent Ambulation: Independent Medication Administration: Independent Home Management: Independent Manage your own finances?: yes Primary transportation is: driving Concerns about vision?: no *vision screening is required for WTM* Concerns about hearing?: no  Fall Screening Falls in the past year?: 0 Number of falls in past year: 0 Was there an injury with Fall?: 0 Fall Risk Category Calculator: 0 Patient Fall Risk Level: Low Fall Risk  Fall Risk Patient at Risk for Falls Due to: No Fall Risks Fall risk Follow up: Falls evaluation completed; Education provided; Falls prevention discussed  Home and Transportation  Safety: All rugs have non-skid backing?: yes All stairs or steps have railings?: yes Grab bars in the bathtub or shower?: yes Have non-skid surface in bathtub or shower?: yes Good home lighting?: yes Regular seat belt use?: yes Hospital stays in the last year:: no  Cognitive Assessment Difficulty concentrating, remembering, or making decisions? : no Will 6CIT or Mini Cog be Completed: no 6CIT or Mini Cog Declined: patient alert, oriented, able to answer questions appropriately and recall recent events  Advance Directives (For Healthcare) Does Patient Have a Medical Advance Directive?: No Would patient like information on creating a medical advance directive?: No - Patient declined  Reviewed/Updated  Reviewed/Updated: Reviewed All (Medical, Surgical, Family, Medications, Allergies, Care Teams, Patient Goals)        Objective:    Today's Vitals   07/29/24 1012  Weight: 192 lb (87.1 kg)  Height: 5' 4 (1.626 m)   Body mass index is 32.96 kg/m.  Current Medications (verified) Outpatient Encounter Medications as of 07/29/2024  Medication Sig   acetaminophen  (TYLENOL ) 500 MG tablet Take 500-1,000 mg by mouth every 6 (six) hours as needed (pain.).   acyclovir  (ZOVIRAX ) 800 MG tablet Take 1 tablet (800 mg total) by mouth 3 (three) times daily.   amLODipine  (NORVASC ) 5 MG tablet TAKE 1 TABLET(5 MG) BY MOUTH DAILY   aspirin 81 MG EC tablet Take 81 mg by mouth at bedtime.   atorvastatin  (LIPITOR) 10 MG tablet Take 1 tablet (10 mg total) by mouth at bedtime.   Biotin 5000 MCG CAPS Take 1 capsule by mouth daily.   Blood Glucose Monitoring Suppl DEVI 1 each by Does not apply route in the morning, at noon, and at bedtime. May substitute to any manufacturer covered by patient's insurance.   Calcium  Carbonate-Vitamin D  600-200 MG-UNIT TABS Take 1 tablet by mouth daily.   carvedilol  (COREG ) 6.25 MG tablet TAKE 1 AND 1/2 TABLETS BY MOUTH TWICE DAILY WITH A MEAL   clobetasol  ointment  (TEMOVATE ) 0.05 % Apply bid to affected area for 2 weeks then 3 x a week   cloNIDine  (CATAPRES ) 0.2 MG tablet Take 1 tablet (0.2 mg total) by mouth daily.   FLUoxetine  (PROZAC ) 40 MG capsule Take 1 capsule (40 mg total) by mouth daily.   fluticasone  (CUTIVATE ) 0.05 % cream Apply 1 Application topically 2 (two) times daily as needed (skin irrtation/rash (skin fold-under breast region)).   fluticasone  (FLONASE ) 50 MCG/ACT nasal spray Place 2 sprays into both nostrils daily.   Glucose Blood (BLOOD GLUCOSE TEST STRIPS) STRP 1 each by In Vitro route in the morning,  at noon, and at bedtime. May substitute to any manufacturer covered by patient's insurance.   Lancet Device MISC 1 each by Does not apply route in the morning, at noon, and at bedtime. May substitute to any manufacturer covered by patient's insurance.   Lancets MISC 1 each by Does not apply route as directed. Dispense based on patient and insurance preference. Use up to four times daily as directed. (FOR ICD-10 E10.9, E11.9).   meloxicam  (MOBIC ) 15 MG tablet Take 1 tablet (15 mg total) by mouth daily.   montelukast  (SINGULAIR ) 10 MG tablet TAKE 1 TABLET(10 MG) BY MOUTH AT BEDTIME   pantoprazole  (PROTONIX ) 40 MG tablet TAKE 1 TABLET(40 MG) BY MOUTH TWICE DAILY BEFORE A MEAL   potassium chloride  (KLOR-CON ) 10 MEQ tablet Take 3 tablets (30 mEq total) by mouth daily for 1 day, THEN 1 tablet (10 mEq total) daily.   spironolactone  (ALDACTONE ) 25 MG tablet TAKE 1 TABLET(25 MG) BY MOUTH DAILY   tirzepatide  (MOUNJARO ) 7.5 MG/0.5ML Pen Inject 7.5 mg into the skin once a week.   venlafaxine  XR (EFFEXOR -XR) 75 MG 24 hr capsule Take 1 capsule (75 mg total) by mouth daily with breakfast.   No facility-administered encounter medications on file as of 07/29/2024.   Hearing/Vision screen Hearing Screening - Comments:: Patient denies any hearing difficulties.   Vision Screening - Comments:: Patient wears eyeglasses and is UTD with yearly eye exam.  [facility] Immunizations and Health Maintenance Health Maintenance  Topic Date Due   Zoster Vaccines- Shingrix (1 of 2) 05/31/1972   Bone Density Scan  08/16/2023   Mammogram  08/21/2024   COVID-19 Vaccine (6 - Pfizer risk 2025-26 season) 11/30/2024   HEMOGLOBIN A1C  01/21/2025   OPHTHALMOLOGY EXAM  01/24/2025   Diabetic kidney evaluation - Urine ACR  03/05/2025   FOOT EXAM  03/05/2025   Diabetic kidney evaluation - eGFR measurement  07/24/2025   Medicare Annual Wellness (AWV)  07/29/2025   DTaP/Tdap/Td (3 - Td or Tdap) 08/07/2027   Colonoscopy  10/10/2032   Pneumococcal Vaccine: 50+ Years  Completed   Influenza Vaccine  Completed   Hepatitis C Screening  Completed   Meningococcal B Vaccine  Aged Out        Assessment/Plan:  This is a routine wellness examination for Alexis Hurst.  Patient Care Team: Antonetta Rollene BRAVO, MD as PCP - General Debera Jayson MATSU, MD as PCP - Cardiology (Cardiology) Hope Almarie ORN, NP as Nurse Practitioner (Pulmonary Disease)  I have personally reviewed and noted the following in the patient's chart:   Medical and social history Use of alcohol, tobacco or illicit drugs  Current medications and supplements including opioid prescriptions. Functional ability and status Nutritional status Physical activity Advanced directives List of other physicians Hospitalizations, surgeries, and ER visits in previous 12 months Vitals Screenings to include cognitive, depression, and falls Referrals and appointments  Orders Placed This Encounter  Procedures   DG Bone Density    Standing Status:   Future    Expiration Date:   07/29/2025    Reason for Exam (SYMPTOM  OR DIAGNOSIS REQUIRED):   post menopausal estrogen deficient    Preferred imaging location?:   [facility]   In addition, I have reviewed and discussed with patient certain preventive protocols, quality metrics, and best practice recommendations. A written personalized care plan for  preventive services as well as general preventive health recommendations were provided to patient.   Alexis Hurst, CMA   07/29/2024   Return  on August 03, 2025 at 9:20 am, for your yearly Medicare Wellness Visit in person.  After Visit Summary: (Mail) Due to this being a telephonic visit, the after visit summary with patients personalized plan was offered to patient via mail   Nurse Notes: na

## 2024-07-29 NOTE — Patient Instructions (Signed)
 Alexis Hurst,  Thank you for taking the time for your Medicare Wellness Visit. I appreciate your continued commitment to your health goals. Please review the care plan we discussed, and feel free to reach out if I can assist you further.  Please note that Annual Wellness Visits do not include a physical exam. Some assessments may be limited, especially if the visit was conducted virtually. If needed, we may recommend an in-person follow-up with your provider.  Ongoing Care Seeing your primary care provider every 3 to 6 months helps us  monitor your health and provide consistent, personalized care.   Referrals If a referral was made during today's visit and you haven't received any updates within two weeks, please contact the referred provider directly to check on the status.  Osteoporosis Screening An order was placed for you to have your Osteoporosis Screening. Call the number below to schedule that [facility]  [phone]   Recommended Screenings:  Health Maintenance  Topic Date Due   Zoster (Shingles) Vaccine (1 of 2) 05/31/1972   Osteoporosis screening with Bone Density Scan  08/16/2023   Breast Cancer Screening  08/21/2024   COVID-19 Vaccine (6 - Pfizer risk 2025-26 season) 11/30/2024   Hemoglobin A1C  01/21/2025   Eye exam for diabetics  01/24/2025   Yearly kidney health urinalysis for diabetes  03/05/2025   Complete foot exam   03/05/2025   Yearly kidney function blood test for diabetes  07/24/2025   Medicare Annual Wellness Visit  07/29/2025   DTaP/Tdap/Td vaccine (3 - Td or Tdap) 08/07/2027   Colon Cancer Screening  10/10/2032   Pneumococcal Vaccine for age over 84  Completed   Flu Shot  Completed   Hepatitis C Screening  Completed   Meningitis B Vaccine  Aged Out       07/29/2024   11:14 AM  Advanced Directives  Does Patient Have a Medical Advance Directive? No  Would patient like information on creating a medical advance directive? No - Patient declined     Vision: Annual vision screenings are recommended for early detection of glaucoma, cataracts, and diabetic retinopathy. These exams can also reveal signs of chronic conditions such as diabetes and high blood pressure.  Dental: Annual dental screenings help detect early signs of oral cancer, gum disease, and other conditions linked to overall health, including heart disease and diabetes.  Please see the attached documents for additional preventive care recommendations.

## 2024-08-05 ENCOUNTER — Encounter

## 2024-08-08 DIAGNOSIS — R7989 Other specified abnormal findings of blood chemistry: Secondary | ICD-10-CM | POA: Diagnosis not present

## 2024-08-09 ENCOUNTER — Other Ambulatory Visit: Payer: Self-pay | Admitting: Student

## 2024-08-09 LAB — CMP14+EGFR
ALT: 11 IU/L (ref 0–32)
AST: 15 IU/L (ref 0–40)
Albumin: 4.3 g/dL (ref 3.8–4.8)
Alkaline Phosphatase: 105 IU/L (ref 49–135)
BUN/Creatinine Ratio: 9 — ABNORMAL LOW (ref 12–28)
BUN: 10 mg/dL (ref 8–27)
Bilirubin Total: 0.3 mg/dL (ref 0.0–1.2)
CO2: 22 mmol/L (ref 20–29)
Calcium: 9.2 mg/dL (ref 8.7–10.3)
Chloride: 103 mmol/L (ref 96–106)
Creatinine, Ser: 1.11 mg/dL — ABNORMAL HIGH (ref 0.57–1.00)
Globulin, Total: 2.9 g/dL (ref 1.5–4.5)
Glucose: 87 mg/dL (ref 70–99)
Potassium: 4.3 mmol/L (ref 3.5–5.2)
Sodium: 139 mmol/L (ref 134–144)
Total Protein: 7.2 g/dL (ref 6.0–8.5)
eGFR: 53 mL/min/1.73 — ABNORMAL LOW (ref >59)

## 2024-08-10 ENCOUNTER — Ambulatory Visit: Payer: Self-pay | Admitting: Family Medicine

## 2024-08-26 ENCOUNTER — Ambulatory Visit (HOSPITAL_COMMUNITY)
Admission: RE | Admit: 2024-08-26 | Discharge: 2024-08-26 | Disposition: A | Discharge status: Home / Self Care | Source: Ambulatory Visit | Attending: Family Medicine | Admitting: Family Medicine

## 2024-08-26 ENCOUNTER — Ambulatory Visit: Payer: Self-pay | Admitting: Internal Medicine

## 2024-08-26 ENCOUNTER — Ambulatory Visit (HOSPITAL_COMMUNITY)
Admission: RE | Admit: 2024-08-26 | Discharge: 2024-08-26 | Disposition: A | Discharge status: Home / Self Care | Source: Ambulatory Visit | Attending: Internal Medicine | Admitting: Internal Medicine

## 2024-08-26 ENCOUNTER — Encounter (HOSPITAL_COMMUNITY): Payer: Self-pay

## 2024-08-26 DIAGNOSIS — Z780 Asymptomatic menopausal state: Secondary | ICD-10-CM | POA: Diagnosis present

## 2024-08-26 DIAGNOSIS — R928 Other abnormal and inconclusive findings on diagnostic imaging of breast: Secondary | ICD-10-CM | POA: Diagnosis present

## 2024-08-26 DIAGNOSIS — N644 Mastodynia: Secondary | ICD-10-CM

## 2024-08-26 DIAGNOSIS — N6320 Unspecified lump in the left breast, unspecified quadrant: Secondary | ICD-10-CM | POA: Diagnosis present

## 2024-09-05 ENCOUNTER — Other Ambulatory Visit: Payer: Self-pay | Admitting: Cardiology

## 2024-09-15 ENCOUNTER — Other Ambulatory Visit: Payer: Self-pay | Admitting: Family Medicine

## 2024-09-24 ENCOUNTER — Encounter

## 2024-10-09 ENCOUNTER — Encounter: Payer: Self-pay | Admitting: Cardiology

## 2024-10-09 ENCOUNTER — Ambulatory Visit: Admitting: Cardiology

## 2024-10-09 VITALS — BP 112/68 | HR 95 | Ht 64.0 in | Wt 198.0 lb

## 2024-10-09 DIAGNOSIS — R002 Palpitations: Secondary | ICD-10-CM | POA: Diagnosis not present

## 2024-10-09 DIAGNOSIS — E782 Mixed hyperlipidemia: Secondary | ICD-10-CM | POA: Diagnosis not present

## 2024-10-09 DIAGNOSIS — I10 Essential (primary) hypertension: Secondary | ICD-10-CM

## 2024-10-09 DIAGNOSIS — R931 Abnormal findings on diagnostic imaging of heart and coronary circulation: Secondary | ICD-10-CM

## 2024-10-09 MED ORDER — ATORVASTATIN CALCIUM 20 MG PO TABS: 20.0000 mg | ORAL_TABLET | Freq: Every day | ORAL | 1 refills | Status: AC

## 2024-10-09 NOTE — Progress Notes (Signed)
"  ° ° °  Cardiology Office Note  Date: 10/09/2024   ID: Alexis, Hurst 1951-01-23, MRN [id number]  History of Present Illness: Alexis Hurst is a 74 y.o. female last seen in May 2025 by Ms. Strader PA-C, I reviewed her note.  Our last visit was in 2023.  She is here for a routine visit.  States that she has been doing fairly well.  She does not not report any exertional chest pain or limiting shortness of breath.  We discussed the results of her coronary CTA from last year.  I went over her medications.  We did discuss an increase in Lipitor 20 mg daily and attempt to get LDL at least under 70.  Her HDL looks good at 77.  Physical Exam: VS:  BP 112/68 (BP Location: Left Arm, Patient Position: Sitting, Cuff Size: Normal)   Pulse 95   Ht 5' 4 (1.626 m)   Wt 198 lb (89.8 kg)   SpO2 96%   BMI 33.99 kg/m , BMI Body mass index is 33.99 kg/m.  Wt Readings from Last 3 Encounters:  10/09/24 198 lb (89.8 kg)  07/29/24 192 lb (87.1 kg)  07/24/24 190 lb (86.2 kg)    General: Patient appears comfortable at rest. HEENT: Conjunctiva and lids normal. Neck: Supple, no elevated JVP or carotid bruits. Lungs: Clear to auscultation, nonlabored breathing at rest. Cardiac: Regular rate and rhythm, no S3 or significant systolic murmur.  ECG:  An ECG dated 01/30/2024 was personally reviewed today and demonstrated:  Sinus rhythm.  Labwork: 10/15/2023: TSH 1.420 07/24/2024: Hemoglobin 13.1; Platelets 332 08/08/2024: ALT 11; AST 15; BUN 10; Creatinine, Ser 1.11; Potassium 4.3; Sodium 139     Component Value Date/Time   CHOL 173 07/24/2024 1114   TRIG 80 07/24/2024 1114   HDL 77 07/24/2024 1114   CHOLHDL 2.2 07/24/2024 1114   CHOLHDL 2.2 01/30/2023 1611   VLDL 18 01/30/2023 1611   LDLCALC 81 07/24/2024 1114   LDLCALC 71 01/30/2020 1121   Other Studies Reviewed Today:  No interval cardiac testing for review today.  Assessment and Plan:  1.  History of palpitations, previously  documented atrial and ventricular ectopy by cardiac monitor in 2022.  She reports rare, brief palpitations, no associated dizziness or syncope.  Would continue with observation at this point.  2.  Coronary calcium  score of 66 with nonobstructive coronary atherosclerosis in the LAD and ramus intermedius by coronary CTA in June 2025.  No active angina with plan for medical therapy.  Continue aspirin 81 g daily, increasing Lipitor to 20 mg daily.  3.  Primary hypertension.  Continue Aldactone  25 mg daily, clonidine  0.2 mg daily, Norvasc  5 mg daily, and Coreg  6.25 mg 1/2 tablets twice daily.  4.  Mixed hyperlipidemia.  LDL 81 and HDL 77 in November 2025.  Increase Lipitor to 20 mg daily.  Disposition:  Follow up 1 year.  Signed, Jayson JUDITHANN Sierras, M.D., F.A.C.C. [facility] "

## 2024-10-09 NOTE — Patient Instructions (Addendum)
 Medication Instructions:  Your physician has recommended you make the following change in your medication:  Increase atorvastatin  to 20 mg daily Continue all other medications as prescribed  Labwork: none  Testing/Procedures: none  Follow-Up: Your physician recommends that you schedule a follow-up appointment in: 1 year. You will receive a reminder call in about 8-10  -+ months reminding you to schedule your appointment. If you don't receive this call, please contact our office.  Any Other Special Instructions Will Be Listed Below (If Applicable).  If you need a refill on your cardiac medications before your next appointment, please call your pharmacy.

## 2024-10-10 ENCOUNTER — Other Ambulatory Visit: Payer: Self-pay | Admitting: Internal Medicine

## 2024-10-10 ENCOUNTER — Telehealth: Payer: Self-pay

## 2024-10-10 DIAGNOSIS — G4733 Obstructive sleep apnea (adult) (pediatric): Secondary | ICD-10-CM | POA: Insufficient documentation

## 2024-10-10 MED ORDER — TIRZEPATIDE 7.5 MG/0.5ML ~~LOC~~ SOAJ: 7.5000 mg | SUBCUTANEOUS | 2 refills | Status: AC

## 2024-10-10 NOTE — Telephone Encounter (Signed)
 Copied from CRM [id number]. Topic: Clinical - Medication Refill >> Oct 10, 2024  4:07 PM Travis F wrote: Patient is calling in because she put in a request for a refill for tirzepatide  (MOUNJARO ) 7.5 MG/0.5ML Pen [[id number]] and the medication hasn't been sent in. Please advise.

## 2024-11-21 ENCOUNTER — Ambulatory Visit: Admitting: Family Medicine

## 2025-08-03 ENCOUNTER — Ambulatory Visit
